# Patient Record
Sex: Female | Born: 1960 | Race: White | Hispanic: No | Marital: Single | Smoking: Never smoker
Health system: Southern US, Community
[De-identification: ages and names within clinical notes are randomized; demographics above are authoritative.]

## PROBLEM LIST (undated history)

## (undated) DIAGNOSIS — J45909 Unspecified asthma, uncomplicated: Secondary | ICD-10-CM

## (undated) DIAGNOSIS — J40 Bronchitis, not specified as acute or chronic: Secondary | ICD-10-CM

## (undated) DIAGNOSIS — I10 Essential (primary) hypertension: Secondary | ICD-10-CM

## (undated) DIAGNOSIS — K449 Diaphragmatic hernia without obstruction or gangrene: Secondary | ICD-10-CM

## (undated) DIAGNOSIS — I2510 Atherosclerotic heart disease of native coronary artery without angina pectoris: Secondary | ICD-10-CM

## (undated) DIAGNOSIS — IMO0002 Reserved for concepts with insufficient information to code with codable children: Secondary | ICD-10-CM

## (undated) DIAGNOSIS — Q231 Congenital insufficiency of aortic valve: Secondary | ICD-10-CM

## (undated) DIAGNOSIS — I248 Other forms of acute ischemic heart disease: Secondary | ICD-10-CM

## (undated) DIAGNOSIS — Z9289 Personal history of other medical treatment: Secondary | ICD-10-CM

## (undated) DIAGNOSIS — R011 Cardiac murmur, unspecified: Secondary | ICD-10-CM

## (undated) HISTORY — DX: Cardiac murmur, unspecified: R01.1

## (undated) HISTORY — PX: ABLATION: SHX5711

## (undated) HISTORY — DX: Bronchitis, not specified as acute or chronic: J40

## (undated) HISTORY — DX: Diaphragmatic hernia without obstruction or gangrene: K44.9

## (undated) HISTORY — PX: OTHER SURGICAL HISTORY: SHX169

## (undated) HISTORY — PX: TOOTH EXTRACTION: SUR596

## (undated) HISTORY — DX: Reserved for concepts with insufficient information to code with codable children: IMO0002

## (undated) HISTORY — DX: Atherosclerotic heart disease of native coronary artery without angina pectoris: I25.10

## (undated) HISTORY — DX: Personal history of other medical treatment: Z92.89

## (undated) HISTORY — DX: Essential (primary) hypertension: I10

---

## 2005-02-27 ENCOUNTER — Emergency Department: Payer: Self-pay | Admitting: Emergency Medicine

## 2005-02-27 ENCOUNTER — Inpatient Hospital Stay: Payer: Self-pay | Admitting: Anesthesiology

## 2005-12-08 ENCOUNTER — Other Ambulatory Visit: Payer: Self-pay

## 2005-12-08 ENCOUNTER — Inpatient Hospital Stay: Payer: Self-pay | Admitting: Internal Medicine

## 2005-12-29 ENCOUNTER — Ambulatory Visit: Payer: Self-pay | Admitting: *Deleted

## 2006-01-08 ENCOUNTER — Ambulatory Visit: Payer: Self-pay | Admitting: *Deleted

## 2006-01-14 ENCOUNTER — Ambulatory Visit: Payer: Self-pay | Admitting: Gastroenterology

## 2006-05-20 ENCOUNTER — Emergency Department: Payer: Self-pay | Admitting: General Practice

## 2007-07-08 ENCOUNTER — Ambulatory Visit: Payer: Self-pay | Admitting: Family Medicine

## 2008-05-16 ENCOUNTER — Inpatient Hospital Stay: Payer: Self-pay | Admitting: Internal Medicine

## 2008-05-16 ENCOUNTER — Other Ambulatory Visit: Payer: Self-pay

## 2008-10-24 ENCOUNTER — Emergency Department: Payer: Self-pay | Admitting: Emergency Medicine

## 2008-11-04 ENCOUNTER — Emergency Department: Payer: Self-pay | Admitting: Internal Medicine

## 2008-11-05 ENCOUNTER — Observation Stay: Payer: Self-pay | Admitting: Obstetrics and Gynecology

## 2008-11-24 ENCOUNTER — Emergency Department: Payer: Self-pay | Admitting: Unknown Physician Specialty

## 2009-03-04 ENCOUNTER — Ambulatory Visit: Payer: Self-pay | Admitting: Internal Medicine

## 2009-11-29 ENCOUNTER — Emergency Department: Payer: Self-pay | Admitting: Emergency Medicine

## 2010-01-09 ENCOUNTER — Emergency Department: Payer: Self-pay | Admitting: Emergency Medicine

## 2010-02-06 ENCOUNTER — Inpatient Hospital Stay: Payer: Self-pay | Admitting: Internal Medicine

## 2010-02-14 ENCOUNTER — Ambulatory Visit: Payer: Self-pay | Admitting: Unknown Physician Specialty

## 2010-02-28 ENCOUNTER — Ambulatory Visit: Payer: Self-pay | Admitting: Unknown Physician Specialty

## 2010-03-24 ENCOUNTER — Ambulatory Visit: Payer: Self-pay | Admitting: Surgery

## 2010-04-01 ENCOUNTER — Ambulatory Visit: Payer: Self-pay | Admitting: Surgery

## 2010-10-04 ENCOUNTER — Emergency Department: Payer: Self-pay | Admitting: Emergency Medicine

## 2011-09-09 ENCOUNTER — Ambulatory Visit: Payer: Self-pay | Admitting: Family Medicine

## 2011-09-12 ENCOUNTER — Inpatient Hospital Stay: Payer: Self-pay | Admitting: *Deleted

## 2011-09-13 ENCOUNTER — Ambulatory Visit: Payer: Self-pay | Admitting: Oncology

## 2011-09-28 ENCOUNTER — Ambulatory Visit: Payer: Self-pay | Admitting: Oncology

## 2011-10-07 ENCOUNTER — Ambulatory Visit: Payer: Self-pay | Admitting: Surgery

## 2011-10-12 ENCOUNTER — Ambulatory Visit: Payer: Self-pay | Admitting: Surgery

## 2011-10-21 ENCOUNTER — Ambulatory Visit: Payer: Self-pay | Admitting: Gastroenterology

## 2011-11-23 ENCOUNTER — Ambulatory Visit: Payer: Self-pay | Admitting: Family Medicine

## 2011-11-26 ENCOUNTER — Ambulatory Visit: Payer: Self-pay | Admitting: Family Medicine

## 2012-03-02 ENCOUNTER — Ambulatory Visit: Payer: Self-pay | Admitting: Family Medicine

## 2012-10-12 ENCOUNTER — Emergency Department: Payer: Self-pay | Admitting: *Deleted

## 2012-10-12 LAB — CBC
HCT: 26 % — ABNORMAL LOW (ref 35.0–47.0)
HGB: 8.3 g/dL — ABNORMAL LOW (ref 12.0–16.0)
RDW: 14.6 % — ABNORMAL HIGH (ref 11.5–14.5)
WBC: 5.5 10*3/uL (ref 3.6–11.0)

## 2012-10-12 LAB — COMPREHENSIVE METABOLIC PANEL
Albumin: 3.5 g/dL (ref 3.4–5.0)
Alkaline Phosphatase: 53 U/L (ref 50–136)
Anion Gap: 9 (ref 7–16)
Calcium, Total: 8.8 mg/dL (ref 8.5–10.1)
Chloride: 105 mmol/L (ref 98–107)
EGFR (African American): >60
Glucose: 107 mg/dL — ABNORMAL HIGH (ref 65–99)
Osmolality: 286 (ref 275–301)
Potassium: 3.5 mmol/L (ref 3.5–5.1)
SGOT(AST): 24 U/L (ref 15–37)
Sodium: 143 mmol/L (ref 136–145)
Total Protein: 7 g/dL (ref 6.4–8.2)

## 2012-10-14 ENCOUNTER — Other Ambulatory Visit: Payer: Self-pay | Admitting: Family Medicine

## 2012-10-14 LAB — CBC WITH DIFFERENTIAL/PLATELET
Basophil %: 1.2 %
Eosinophil #: 0.1 10*3/uL (ref 0.0–0.7)
Eosinophil %: 1.4 %
HCT: 27.4 % — ABNORMAL LOW (ref 35.0–47.0)
Lymphocyte #: 0.5 10*3/uL — ABNORMAL LOW (ref 1.0–3.6)
MCH: 30 pg (ref 26.0–34.0)
MCHC: 32.3 g/dL (ref 32.0–36.0)
MCV: 93 fL (ref 80–100)
Monocyte #: 0.4 x10 3/mm (ref 0.2–0.9)
Monocyte %: 9.5 %
Neutrophil %: 77.7 %
Platelet: 248 10*3/uL (ref 150–440)
RBC: 2.95 10*6/uL — ABNORMAL LOW (ref 3.80–5.20)
RDW: 14.4 % (ref 11.5–14.5)
WBC: 4.5 10*3/uL (ref 3.6–11.0)

## 2012-10-20 ENCOUNTER — Ambulatory Visit: Payer: Self-pay | Admitting: Oncology

## 2012-10-20 LAB — IRON AND TIBC
Iron Bind.Cap.(Total): 411 ug/dL (ref 250–450)
Iron Saturation: 27 %
Iron: 113 ug/dL (ref 50–170)

## 2012-10-20 LAB — CBC CANCER CENTER
Basophil #: 0.1 x10 3/mm (ref 0.0–0.1)
HCT: 21.7 % — ABNORMAL LOW (ref 35.0–47.0)
Lymphocyte %: 8.9 %
MCV: 92 fL (ref 80–100)
Monocyte #: 0.7 x10 3/mm (ref 0.2–0.9)
Monocyte %: 8.5 %
Neutrophil #: 6.9 x10 3/mm — ABNORMAL HIGH (ref 1.4–6.5)
Platelet: 269 x10 3/mm (ref 150–440)
RBC: 2.36 10*6/uL — ABNORMAL LOW (ref 3.80–5.20)
RDW: 14.2 % (ref 11.5–14.5)
WBC: 8.4 x10 3/mm (ref 3.6–11.0)

## 2012-10-20 LAB — FOLATE: Folic Acid: 98.7 ng/mL (ref 3.1–100.0)

## 2012-10-20 LAB — FERRITIN: Ferritin (ARMC): 9 ng/mL (ref 8–388)

## 2012-10-28 ENCOUNTER — Ambulatory Visit: Payer: Self-pay | Admitting: Oncology

## 2012-11-18 LAB — CBC CANCER CENTER
Basophil #: 0.1 x10 3/mm (ref 0.0–0.1)
Basophil %: 1 %
Eosinophil #: 0.1 x10 3/mm (ref 0.0–0.7)
Lymphocyte %: 13.5 %
MCH: 26.6 pg (ref 26.0–34.0)
MCHC: 30.5 g/dL — ABNORMAL LOW (ref 32.0–36.0)
Monocyte #: 0.8 x10 3/mm (ref 0.2–0.9)
Neutrophil #: 4 x10 3/mm (ref 1.4–6.5)
Neutrophil %: 69.6 %
Platelet: 297 x10 3/mm (ref 150–440)
RDW: 14.9 % — ABNORMAL HIGH (ref 11.5–14.5)
WBC: 5.7 x10 3/mm (ref 3.6–11.0)

## 2012-11-27 ENCOUNTER — Ambulatory Visit: Payer: Self-pay | Admitting: Oncology

## 2012-12-28 ENCOUNTER — Ambulatory Visit: Payer: Self-pay | Admitting: Oncology

## 2013-01-20 LAB — CBC CANCER CENTER
Basophil %: 1 %
Eosinophil #: 0.1 x10 3/mm (ref 0.0–0.7)
HGB: 12.5 g/dL (ref 12.0–16.0)
Lymphocyte #: 0.8 x10 3/mm — ABNORMAL LOW (ref 1.0–3.6)
Lymphocyte %: 12.1 %
MCH: 28.2 pg (ref 26.0–34.0)
MCV: 87 fL (ref 80–100)
Monocyte #: 0.6 x10 3/mm (ref 0.2–0.9)
Monocyte %: 10 %
Neutrophil #: 4.8 x10 3/mm (ref 1.4–6.5)
RDW: 17.7 % — ABNORMAL HIGH (ref 11.5–14.5)
WBC: 6.3 x10 3/mm (ref 3.6–11.0)

## 2013-01-20 LAB — FERRITIN: Ferritin (ARMC): 18 ng/mL (ref 8–388)

## 2013-01-20 LAB — IRON AND TIBC
Iron Bind.Cap.(Total): 403 ug/dL (ref 250–450)
Iron Saturation: 31 %
Iron: 124 ug/dL (ref 50–170)
Unbound Iron-Bind.Cap.: 279 ug/dL

## 2013-01-28 ENCOUNTER — Ambulatory Visit: Payer: Self-pay | Admitting: Oncology

## 2013-03-28 ENCOUNTER — Ambulatory Visit: Payer: Self-pay | Admitting: Oncology

## 2013-04-20 LAB — RETICULOCYTES
Absolute Retic Count: 0.1526 10*6/uL
Reticulocyte: 6.05 % — ABNORMAL HIGH

## 2013-04-20 LAB — CBC CANCER CENTER
Basophil %: 1.5 %
HCT: 21.2 % — ABNORMAL LOW (ref 35.0–47.0)
HGB: 6.1 g/dL — ABNORMAL LOW (ref 12.0–16.0)
Lymphocyte %: 9.2 %
Monocyte %: 10.9 %
Neutrophil #: 4 x10 3/mm (ref 1.4–6.5)
Platelet: 244 x10 3/mm (ref 150–440)
RBC: 2.53 10*6/uL — ABNORMAL LOW (ref 3.80–5.20)
RDW: 16.1 % — ABNORMAL HIGH (ref 11.5–14.5)
WBC: 5.2 x10 3/mm (ref 3.6–11.0)

## 2013-04-20 LAB — LACTATE DEHYDROGENASE: LDH: 183 U/L (ref 81–246)

## 2013-04-20 LAB — IRON AND TIBC
Iron Saturation: 22 %
Iron: 94 ug/dL (ref 50–170)
Unbound Iron-Bind.Cap.: 334 ug/dL

## 2013-04-20 LAB — FOLATE: Folic Acid: 79.6 ng/mL (ref 3.1–100.0)

## 2013-04-27 ENCOUNTER — Ambulatory Visit: Payer: Self-pay | Admitting: Oncology

## 2013-04-27 ENCOUNTER — Inpatient Hospital Stay: Payer: Self-pay | Admitting: Internal Medicine

## 2013-04-27 LAB — COMPREHENSIVE METABOLIC PANEL
Anion Gap: 6 — ABNORMAL LOW (ref 7–16)
BUN: 28 mg/dL — ABNORMAL HIGH (ref 7–18)
Chloride: 104 mmol/L (ref 98–107)
Co2: 29 mmol/L (ref 21–32)
EGFR (African American): >60
Glucose: 111 mg/dL — ABNORMAL HIGH (ref 65–99)
Osmolality: 284 (ref 275–301)
SGPT (ALT): 18 U/L (ref 12–78)
Sodium: 139 mmol/L (ref 136–145)
Total Protein: 6 g/dL — ABNORMAL LOW (ref 6.4–8.2)

## 2013-04-27 LAB — CBC
HCT: 22 % — ABNORMAL LOW (ref 35.0–47.0)
MCH: 25.7 pg — ABNORMAL LOW (ref 26.0–34.0)
MCHC: 30.7 g/dL — ABNORMAL LOW (ref 32.0–36.0)
MCV: 84 fL (ref 80–100)
RBC: 2.63 10*6/uL — ABNORMAL LOW (ref 3.80–5.20)
RDW: 16 % — ABNORMAL HIGH (ref 11.5–14.5)

## 2013-04-27 LAB — URINALYSIS, COMPLETE
Hyaline Cast: 3
Ketone: NEGATIVE
Ph: 5 (ref 4.5–8.0)
Protein: NEGATIVE
Specific Gravity: 1.02 (ref 1.003–1.030)

## 2013-04-27 LAB — TROPONIN I: Troponin-I: <0.02 ng/mL

## 2013-04-28 LAB — CBC WITH DIFFERENTIAL/PLATELET
Eosinophil %: 0.4 %
HCT: 19.9 % — ABNORMAL LOW (ref 35.0–47.0)
Lymphocyte #: 0.4 10*3/uL — ABNORMAL LOW (ref 1.0–3.6)
Lymphocyte %: 6.5 %
MCH: 25.4 pg — ABNORMAL LOW (ref 26.0–34.0)
MCV: 84 fL (ref 80–100)
Monocyte #: 0.5 x10 3/mm (ref 0.2–0.9)
Neutrophil %: 85.1 %
RBC: 2.37 10*6/uL — ABNORMAL LOW (ref 3.80–5.20)
RDW: 16.3 % — ABNORMAL HIGH (ref 11.5–14.5)
WBC: 6.9 10*3/uL (ref 3.6–11.0)

## 2013-04-28 LAB — PROTIME-INR
INR: 1
Prothrombin Time: 13.8 secs (ref 11.5–14.7)

## 2013-04-28 LAB — BASIC METABOLIC PANEL
BUN: 23 mg/dL — ABNORMAL HIGH (ref 7–18)
Co2: 31 mmol/L (ref 21–32)
Creatinine: 0.69 mg/dL (ref 0.60–1.30)
EGFR (African American): >60
Osmolality: 284 (ref 275–301)
Sodium: 140 mmol/L (ref 136–145)

## 2013-04-29 LAB — CBC WITH DIFFERENTIAL/PLATELET
Basophil %: 1.6 %
Eosinophil %: 1.8 %
Lymphocyte #: 0.7 10*3/uL — ABNORMAL LOW (ref 1.0–3.6)
Lymphocyte %: 18.9 %
MCH: 26.9 pg (ref 26.0–34.0)
MCHC: 32.6 g/dL (ref 32.0–36.0)
MCV: 83 fL (ref 80–100)
Monocyte %: 11.2 %
Neutrophil #: 2.5 10*3/uL (ref 1.4–6.5)
Neutrophil %: 66.5 %
Platelet: 202 10*3/uL (ref 150–440)
RBC: 3.14 10*6/uL — ABNORMAL LOW (ref 3.80–5.20)
RDW: 16.1 % — ABNORMAL HIGH (ref 11.5–14.5)

## 2013-04-29 LAB — POTASSIUM: Potassium: 3.6 mmol/L (ref 3.5–5.1)

## 2013-05-11 LAB — CBC CANCER CENTER
Basophil %: 2.6 %
Eosinophil #: 0.1 x10 3/mm (ref 0.0–0.7)
Eosinophil %: 1.7 %
HCT: 34.2 % — ABNORMAL LOW (ref 35.0–47.0)
Lymphocyte #: 0.6 x10 3/mm — ABNORMAL LOW (ref 1.0–3.6)
Lymphocyte %: 16.4 %
MCH: 25.3 pg — ABNORMAL LOW (ref 26.0–34.0)
MCHC: 30.7 g/dL — ABNORMAL LOW (ref 32.0–36.0)
Monocyte #: 0.5 x10 3/mm (ref 0.2–0.9)
Neutrophil #: 2.5 x10 3/mm (ref 1.4–6.5)
Neutrophil %: 65.5 %
Platelet: 299 x10 3/mm (ref 150–440)
RDW: 15.2 % — ABNORMAL HIGH (ref 11.5–14.5)
WBC: 3.9 x10 3/mm (ref 3.6–11.0)

## 2013-05-28 ENCOUNTER — Ambulatory Visit: Payer: Self-pay | Admitting: Oncology

## 2013-06-08 LAB — IRON AND TIBC
Iron: 57 ug/dL (ref 50–170)
Unbound Iron-Bind.Cap.: 330 ug/dL

## 2013-06-08 LAB — CBC CANCER CENTER
Basophil #: 0.1 x10 3/mm (ref 0.0–0.1)
Basophil %: 1.2 %
Lymphocyte #: 0.7 x10 3/mm — ABNORMAL LOW (ref 1.0–3.6)
Lymphocyte %: 16.4 %
MCHC: 32.4 g/dL (ref 32.0–36.0)
MCV: 84 fL (ref 80–100)
Neutrophil #: 2.9 x10 3/mm (ref 1.4–6.5)
RBC: 4.44 10*6/uL (ref 3.80–5.20)
WBC: 4.2 x10 3/mm (ref 3.6–11.0)

## 2013-06-27 ENCOUNTER — Ambulatory Visit: Payer: Self-pay | Admitting: Oncology

## 2013-07-06 LAB — CBC CANCER CENTER
Basophil #: 0.1 x10 3/mm (ref 0.0–0.1)
Basophil %: 1.1 %
Eosinophil #: 0.1 x10 3/mm (ref 0.0–0.7)
HCT: 41.3 % (ref 35.0–47.0)
HGB: 13.5 g/dL (ref 12.0–16.0)
Lymphocyte #: 0.8 x10 3/mm — ABNORMAL LOW (ref 1.0–3.6)
Lymphocyte %: 13.9 %
MCH: 28.4 pg (ref 26.0–34.0)
Neutrophil %: 73.1 %
Platelet: 221 x10 3/mm (ref 150–440)

## 2013-07-13 LAB — CBC CANCER CENTER
Basophil #: 0.1 x10 3/mm (ref 0.0–0.1)
Basophil %: 0.9 %
Eosinophil %: 0.6 %
HCT: 41.1 % (ref 35.0–47.0)
HGB: 13.7 g/dL (ref 12.0–16.0)
Lymphocyte %: 16.1 %
MCH: 29 pg (ref 26.0–34.0)
MCV: 87 fL (ref 80–100)
Monocyte %: 7.5 %
Neutrophil #: 4.9 x10 3/mm (ref 1.4–6.5)
Neutrophil %: 74.9 %
Platelet: 229 x10 3/mm (ref 150–440)
RBC: 4.71 10*6/uL (ref 3.80–5.20)
RDW: 19.5 % — ABNORMAL HIGH (ref 11.5–14.5)
WBC: 6.5 x10 3/mm (ref 3.6–11.0)

## 2013-07-28 ENCOUNTER — Ambulatory Visit: Payer: Self-pay | Admitting: Oncology

## 2013-10-31 ENCOUNTER — Inpatient Hospital Stay: Payer: Self-pay | Admitting: Internal Medicine

## 2013-10-31 LAB — BASIC METABOLIC PANEL
Anion Gap: 3 — ABNORMAL LOW (ref 7–16)
BUN: 7 mg/dL (ref 7–18)
Calcium, Total: 9.7 mg/dL (ref 8.5–10.1)
EGFR (African American): >60
EGFR (Non-African Amer.): >60
Glucose: 124 mg/dL — ABNORMAL HIGH (ref 65–99)
Osmolality: 271 (ref 275–301)
Potassium: 2.9 mmol/L — ABNORMAL LOW (ref 3.5–5.1)

## 2013-10-31 LAB — CBC
MCH: 31.7 pg (ref 26.0–34.0)
MCHC: 34.2 g/dL (ref 32.0–36.0)
Platelet: 209 10*3/uL (ref 150–440)
RBC: 4.12 10*6/uL (ref 3.80–5.20)
WBC: 11.7 10*3/uL — ABNORMAL HIGH (ref 3.6–11.0)

## 2013-11-01 LAB — CBC WITH DIFFERENTIAL/PLATELET
Basophil #: 0 10*3/uL (ref 0.0–0.1)
Basophil %: 0 %
Eosinophil #: 0 10*3/uL (ref 0.0–0.7)
Eosinophil %: 0 %
HCT: 36.6 % (ref 35.0–47.0)
Lymphocyte #: 0.4 10*3/uL — ABNORMAL LOW (ref 1.0–3.6)
Lymphocyte %: 3.2 %
MCH: 31.4 pg (ref 26.0–34.0)
MCHC: 33.9 g/dL (ref 32.0–36.0)
MCV: 93 fL (ref 80–100)
Platelet: 250 10*3/uL (ref 150–440)
RBC: 3.95 10*6/uL (ref 3.80–5.20)
RDW: 14.3 % (ref 11.5–14.5)
WBC: 12.3 10*3/uL — ABNORMAL HIGH (ref 3.6–11.0)

## 2013-11-01 LAB — MAGNESIUM: Magnesium: 2.6 mg/dL — ABNORMAL HIGH

## 2013-11-09 ENCOUNTER — Ambulatory Visit: Payer: Self-pay | Admitting: Oncology

## 2013-11-09 LAB — CBC CANCER CENTER
Basophil %: 0.5 %
Eosinophil %: 0.3 %
HCT: 42.5 % (ref 35.0–47.0)
HGB: 14 g/dL (ref 12.0–16.0)
Lymphocyte #: 1.7 x10 3/mm (ref 1.0–3.6)
Lymphocyte %: 15.6 %
MCHC: 32.8 g/dL (ref 32.0–36.0)
MCV: 94 fL (ref 80–100)
Platelet: 281 x10 3/mm (ref 150–440)
RBC: 4.53 10*6/uL (ref 3.80–5.20)
RDW: 14.3 % (ref 11.5–14.5)

## 2013-11-09 LAB — IRON AND TIBC
Iron Bind.Cap.(Total): 310 ug/dL (ref 250–450)
Iron Saturation: 42 %

## 2013-11-27 ENCOUNTER — Ambulatory Visit: Payer: Self-pay | Admitting: Oncology

## 2014-02-08 ENCOUNTER — Ambulatory Visit: Payer: Self-pay | Admitting: Oncology

## 2014-02-09 LAB — CBC CANCER CENTER
Basophil #: 0 x10 3/mm (ref 0.0–0.1)
Basophil %: 0.7 %
EOS PCT: 1.6 %
Eosinophil #: 0.1 x10 3/mm (ref 0.0–0.7)
HCT: 41 % (ref 35.0–47.0)
HGB: 13.8 g/dL (ref 12.0–16.0)
Lymphocyte #: 0.7 x10 3/mm — ABNORMAL LOW (ref 1.0–3.6)
Lymphocyte %: 14.6 %
MCH: 31.9 pg (ref 26.0–34.0)
MCHC: 33.8 g/dL (ref 32.0–36.0)
MCV: 94 fL (ref 80–100)
MONO ABS: 0.5 x10 3/mm (ref 0.2–0.9)
Monocyte %: 10.6 %
Neutrophil #: 3.6 x10 3/mm (ref 1.4–6.5)
Neutrophil %: 72.5 %
Platelet: 206 x10 3/mm (ref 150–440)
RBC: 4.34 10*6/uL (ref 3.80–5.20)
RDW: 13.7 % (ref 11.5–14.5)
WBC: 5 x10 3/mm (ref 3.6–11.0)

## 2014-02-09 LAB — IRON AND TIBC
IRON SATURATION: 27 %
Iron Bind.Cap.(Total): 381 ug/dL (ref 250–450)
Iron: 103 ug/dL (ref 50–170)
Unbound Iron-Bind.Cap.: 278 ug/dL

## 2014-02-09 LAB — FERRITIN: FERRITIN (ARMC): 31 ng/mL (ref 8–388)

## 2014-02-25 ENCOUNTER — Ambulatory Visit: Payer: Self-pay | Admitting: Oncology

## 2014-05-10 ENCOUNTER — Ambulatory Visit: Payer: Self-pay | Admitting: Oncology

## 2014-05-11 LAB — IRON AND TIBC
IRON SATURATION: 27 %
Iron Bind.Cap.(Total): 355 ug/dL (ref 250–450)
Iron: 97 ug/dL (ref 50–170)
UNBOUND IRON-BIND. CAP.: 258 ug/dL

## 2014-05-11 LAB — CBC CANCER CENTER
BASOS PCT: 0.8 %
Basophil #: 0 x10 3/mm (ref 0.0–0.1)
EOS ABS: 0.1 x10 3/mm (ref 0.0–0.7)
Eosinophil %: 2.6 %
HCT: 40.8 % (ref 35.0–47.0)
HGB: 13.6 g/dL (ref 12.0–16.0)
LYMPHS ABS: 0.9 x10 3/mm — AB (ref 1.0–3.6)
LYMPHS PCT: 16.9 %
MCH: 30.4 pg (ref 26.0–34.0)
MCHC: 33.3 g/dL (ref 32.0–36.0)
MCV: 91 fL (ref 80–100)
Monocyte #: 0.5 x10 3/mm (ref 0.2–0.9)
Monocyte %: 8.9 %
NEUTROS ABS: 3.9 x10 3/mm (ref 1.4–6.5)
Neutrophil %: 70.8 %
Platelet: 209 x10 3/mm (ref 150–440)
RBC: 4.47 10*6/uL (ref 3.80–5.20)
RDW: 14.3 % (ref 11.5–14.5)
WBC: 5.6 x10 3/mm (ref 3.6–11.0)

## 2014-05-11 LAB — FERRITIN: FERRITIN (ARMC): 27 ng/mL (ref 8–388)

## 2014-05-28 ENCOUNTER — Ambulatory Visit: Payer: Self-pay | Admitting: Oncology

## 2014-07-30 ENCOUNTER — Ambulatory Visit (INDEPENDENT_AMBULATORY_CARE_PROVIDER_SITE_OTHER): Payer: BC Managed Care – PPO | Admitting: Family Medicine

## 2014-07-30 ENCOUNTER — Encounter: Payer: Self-pay | Admitting: Family Medicine

## 2014-07-30 VITALS — BP 128/70 | HR 77 | Temp 97.7°F | Ht <= 58 in | Wt 177.5 lb

## 2014-07-30 DIAGNOSIS — I10 Essential (primary) hypertension: Secondary | ICD-10-CM

## 2014-07-30 DIAGNOSIS — Z136 Encounter for screening for cardiovascular disorders: Secondary | ICD-10-CM

## 2014-07-30 DIAGNOSIS — F4323 Adjustment disorder with mixed anxiety and depressed mood: Secondary | ICD-10-CM

## 2014-07-30 DIAGNOSIS — J449 Chronic obstructive pulmonary disease, unspecified: Secondary | ICD-10-CM

## 2014-07-30 DIAGNOSIS — E538 Deficiency of other specified B group vitamins: Secondary | ICD-10-CM

## 2014-07-30 DIAGNOSIS — Z1231 Encounter for screening mammogram for malignant neoplasm of breast: Secondary | ICD-10-CM

## 2014-07-30 DIAGNOSIS — J4489 Other specified chronic obstructive pulmonary disease: Secondary | ICD-10-CM

## 2014-07-30 DIAGNOSIS — D509 Iron deficiency anemia, unspecified: Secondary | ICD-10-CM

## 2014-07-30 DIAGNOSIS — D62 Acute posthemorrhagic anemia: Secondary | ICD-10-CM

## 2014-07-30 DIAGNOSIS — Z8719 Personal history of other diseases of the digestive system: Secondary | ICD-10-CM

## 2014-07-30 LAB — CBC WITH DIFFERENTIAL/PLATELET
BASOS PCT: 0.4 % (ref 0.0–3.0)
Basophils Absolute: 0 10*3/uL (ref 0.0–0.1)
EOS PCT: 1.3 % (ref 0.0–5.0)
Eosinophils Absolute: 0.1 10*3/uL (ref 0.0–0.7)
HCT: 39 % (ref 36.0–46.0)
Hemoglobin: 12.9 g/dL (ref 12.0–15.0)
Lymphocytes Relative: 11.5 % — ABNORMAL LOW (ref 12.0–46.0)
Lymphs Abs: 0.8 10*3/uL (ref 0.7–4.0)
MCHC: 33 g/dL (ref 30.0–36.0)
MCV: 93.3 fl (ref 78.0–100.0)
MONO ABS: 0.6 10*3/uL (ref 0.1–1.0)
Monocytes Relative: 9.1 % (ref 3.0–12.0)
NEUTROS ABS: 5.3 10*3/uL (ref 1.4–7.7)
NEUTROS PCT: 77.7 % — AB (ref 43.0–77.0)
Platelets: 234 10*3/uL (ref 150.0–400.0)
RBC: 4.19 Mil/uL (ref 3.87–5.11)
RDW: 14.5 % (ref 11.5–15.5)
WBC: 6.9 10*3/uL (ref 4.0–10.5)

## 2014-07-30 LAB — COMPREHENSIVE METABOLIC PANEL
ALK PHOS: 58 U/L (ref 39–117)
ALT: 14 U/L (ref 0–35)
AST: 22 U/L (ref 0–37)
Albumin: 4.1 g/dL (ref 3.5–5.2)
BUN: 17 mg/dL (ref 6–23)
CO2: 31 meq/L (ref 19–32)
Calcium: 9.1 mg/dL (ref 8.4–10.5)
Chloride: 102 mEq/L (ref 96–112)
Creatinine, Ser: 0.8 mg/dL (ref 0.4–1.2)
GFR: 80.5 mL/min (ref >60.00)
Glucose, Bld: 101 mg/dL — ABNORMAL HIGH (ref 70–99)
Potassium: 3.4 mEq/L — ABNORMAL LOW (ref 3.5–5.1)
SODIUM: 139 meq/L (ref 135–145)
TOTAL PROTEIN: 7.2 g/dL (ref 6.0–8.3)
Total Bilirubin: 0.5 mg/dL (ref 0.2–1.2)

## 2014-07-30 LAB — LIPID PANEL
CHOL/HDL RATIO: 3
Cholesterol: 210 mg/dL — ABNORMAL HIGH (ref 0–200)
HDL: 61.2 mg/dL (ref >39.00)
LDL CALC: 110 mg/dL — AB (ref 0–99)
NONHDL: 148.8
Triglycerides: 196 mg/dL — ABNORMAL HIGH (ref 0.0–149.0)
VLDL: 39.2 mg/dL (ref 0.0–40.0)

## 2014-07-30 LAB — FERRITIN: Ferritin: 32.3 ng/mL (ref 10.0–291.0)

## 2014-07-30 LAB — VITAMIN B12: VITAMIN B 12: 1109 pg/mL — AB (ref 211–911)

## 2014-07-30 LAB — IBC PANEL
IRON: 65 ug/dL (ref 42–145)
Saturation Ratios: 18.1 % — ABNORMAL LOW (ref 20.0–50.0)
TRANSFERRIN: 256.8 mg/dL (ref 212.0–360.0)

## 2014-07-30 LAB — TSH: TSH: 1.16 u[IU]/mL (ref 0.35–4.50)

## 2014-07-30 MED ORDER — SERTRALINE HCL 25 MG PO TABS: 25.0000 mg | ORAL_TABLET | Freq: Every day | ORAL | Status: DC

## 2014-07-30 NOTE — Patient Instructions (Addendum)
It was nice to meet you. We are starting zoloft 25 mg daily. Please come see me in 1 month.  I will call you with your lab results form today.  Please call to set up your mammogram.
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
## 2014-07-30 NOTE — Assessment & Plan Note (Signed)
Well controlled on current rx. No changes. 

## 2014-07-30 NOTE — Assessment & Plan Note (Addendum)
On Advair- will request records for PFTs. Does not use rescue inhaler.  We discussed this today- she could benefit from this.

## 2014-07-30 NOTE — Assessment & Plan Note (Signed)
On iron, folate and b12.  Awaiting old records. Check labs today.

## 2014-07-30 NOTE — Assessment & Plan Note (Signed)
Check labs today. Orders Placed This Encounter  Procedures  . MM Digital Screening  . CBC with Differential  . Ferritin  . IBC Panel  . Vitamin B12  . Comprehensive metabolic panel  . Lipid panel  . TSH

## 2014-07-30 NOTE — Progress Notes (Signed)
Pre visit review using our clinic review tool, if applicable. No additional management support is needed unless otherwise documented below in the visit note. 

## 2014-07-30 NOTE — Assessment & Plan Note (Signed)
New- progressive due to financial stressors.  She is talking to an attorney about disability/unemployment options. Start low dose zoloft- 25 mg daily. Follow up in 1 month . The patient indicates understanding of these issues and agrees with the plan.

## 2014-07-31 ENCOUNTER — Ambulatory Visit: Payer: Self-pay | Admitting: Family Medicine

## 2014-07-31 ENCOUNTER — Telehealth: Payer: Self-pay | Admitting: Family Medicine

## 2014-07-31 NOTE — Telephone Encounter (Signed)
Relevant patient education assigned to patient using Emmi. ° °

## 2014-08-02 ENCOUNTER — Encounter: Payer: Self-pay | Admitting: Family Medicine

## 2014-08-17 ENCOUNTER — Encounter: Payer: Self-pay | Admitting: Family Medicine

## 2014-08-17 ENCOUNTER — Ambulatory Visit (INDEPENDENT_AMBULATORY_CARE_PROVIDER_SITE_OTHER): Payer: BC Managed Care – PPO | Admitting: Family Medicine

## 2014-08-17 VITALS — BP 148/92 | HR 85 | Temp 98.2°F | Wt 178.2 lb

## 2014-08-17 DIAGNOSIS — I10 Essential (primary) hypertension: Secondary | ICD-10-CM

## 2014-08-17 DIAGNOSIS — J449 Chronic obstructive pulmonary disease, unspecified: Secondary | ICD-10-CM

## 2014-08-17 DIAGNOSIS — F4323 Adjustment disorder with mixed anxiety and depressed mood: Secondary | ICD-10-CM

## 2014-08-17 DIAGNOSIS — D62 Acute posthemorrhagic anemia: Secondary | ICD-10-CM

## 2014-08-17 DIAGNOSIS — Z8719 Personal history of other diseases of the digestive system: Secondary | ICD-10-CM

## 2014-08-17 DIAGNOSIS — Z23 Encounter for immunization: Secondary | ICD-10-CM

## 2014-08-17 DIAGNOSIS — D509 Iron deficiency anemia, unspecified: Secondary | ICD-10-CM

## 2014-08-17 NOTE — Assessment & Plan Note (Signed)
Resolved with daily ferrous sulfate. CBC results discussed with pt today.

## 2014-08-17 NOTE — Patient Instructions (Signed)
Please restart your zoloft and give a couple more weeks. Call me with an update.

## 2014-08-17 NOTE — Assessment & Plan Note (Signed)
Persistent but she stopped taking SSRI. >25 minutes spent in face to face time with patient, >50% spent in counselling or coordination of care She does agree to restart zoloft 25 mg daily and call me in 2 weeks with an update.

## 2014-08-17 NOTE — Assessment & Plan Note (Addendum)
She can no longer afford advair. We will call her pharmacy to find out what is on formulary for her. Influenza vaccine given today.

## 2014-08-17 NOTE — Assessment & Plan Note (Signed)
Reasonable control on current rx.  Was normotensive earlier this month. She is nervous today. Asymptomatic.

## 2014-08-17 NOTE — Progress Notes (Signed)
Pre visit review using our clinic review tool, if applicable. No additional management support is needed unless otherwise documented below in the visit note. 
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
## 2014-09-17 ENCOUNTER — Telehealth: Payer: Self-pay | Admitting: Family Medicine

## 2014-09-17 MED ORDER — ALBUTEROL SULFATE HFA 108 (90 BASE) MCG/ACT IN AERS: 2.0000 | INHALATION_SPRAY | Freq: Four times a day (QID) | RESPIRATORY_TRACT | Status: DC | PRN

## 2014-09-17 NOTE — Telephone Encounter (Signed)
Pt walked in and had stopped by her pharmacy because she needs a cheaper, low cost inhaler. Pt said, per her pharmacy, that she used to be on Ventolin HFA 90 mcg inhaler. She can get that for $10 with her insurance compared to $60 for the other inhaler. She cannot continue to pay the $60 and she needs the inhaler. Money is very tight. Pt is aware you are out of the office today and will return tomorrow. Thank you

## 2014-09-17 NOTE — Telephone Encounter (Signed)
OK to send in Ventolin if that is cheaper for her

## 2014-09-17 NOTE — Telephone Encounter (Signed)
Rx sent to requested pharmacy

## 2014-11-27 ENCOUNTER — Other Ambulatory Visit: Payer: Self-pay | Admitting: *Deleted

## 2014-11-27 MED ORDER — LISINOPRIL-HYDROCHLOROTHIAZIDE 10-12.5 MG PO TABS: 1.0000 | ORAL_TABLET | Freq: Every day | ORAL | Status: DC

## 2014-12-17 ENCOUNTER — Emergency Department: Payer: Self-pay | Admitting: Emergency Medicine

## 2014-12-17 ENCOUNTER — Telehealth: Payer: Self-pay

## 2014-12-17 LAB — BASIC METABOLIC PANEL
Anion Gap: 6 — ABNORMAL LOW (ref 7–16)
BUN: 12 mg/dL (ref 7–18)
CHLORIDE: 103 mmol/L (ref 98–107)
Calcium, Total: 9.1 mg/dL (ref 8.5–10.1)
Co2: 31 mmol/L (ref 21–32)
Creatinine: 0.66 mg/dL (ref 0.60–1.30)
EGFR (African American): >60
Glucose: 106 mg/dL — ABNORMAL HIGH (ref 65–99)
Osmolality: 280 (ref 275–301)
Potassium: 3.6 mmol/L (ref 3.5–5.1)
Sodium: 140 mmol/L (ref 136–145)

## 2014-12-17 LAB — CBC
HCT: 44.1 % (ref 35.0–47.0)
HGB: 13.9 g/dL (ref 12.0–16.0)
MCH: 30.1 pg (ref 26.0–34.0)
MCHC: 31.5 g/dL — ABNORMAL LOW (ref 32.0–36.0)
MCV: 95 fL (ref 80–100)
Platelet: 204 10*3/uL (ref 150–440)
RBC: 4.62 10*6/uL (ref 3.80–5.20)
RDW: 14.6 % — ABNORMAL HIGH (ref 11.5–14.5)
WBC: 6.1 10*3/uL (ref 3.6–11.0)

## 2014-12-17 LAB — TROPONIN I: Troponin-I: <0.02 ng/mL
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
## 2014-12-19 ENCOUNTER — Telehealth: Payer: Self-pay | Admitting: Family Medicine

## 2014-12-19 NOTE — Telephone Encounter (Signed)
Patient called to ask if she has to pay the bill she will get for the ambulance "we made her take" when she walked in with shortness of breath.  I explained that we will file this on her insurance, but yet if there is a bill she will be responsible.  I told her that Dr. Dayton MartesAron would not have sent her by EMS unless she felt it was medically necessary.  She understood. Pt states she will be bringing forms for DR. Dayton Martesron to fill out to get a handicapped sticker which is not related to this shortness of breath incident.

## 2014-12-19 NOTE — Telephone Encounter (Signed)
I did not see her.  She was not on my schedule  Rena came to me while I was seeing pts and described her symptoms- shortness of breath which to her seemed bad enough that Rena did not feel comfortable with her driving.  Rena told me that the patient herself also did not feel comfortable driving if I remember correctly.  Obviously with this information, we had to suggest she go to ER via EMS.  You [facility] for more information.

## 2014-12-31 ENCOUNTER — Telehealth: Payer: Self-pay | Admitting: Family Medicine

## 2014-12-31 DIAGNOSIS — Z7689 Persons encountering health services in other specified circumstances: Secondary | ICD-10-CM

## 2014-12-31 NOTE — Telephone Encounter (Signed)
Form placed in Dr Elmer Sow inbox

## 2014-12-31 NOTE — Telephone Encounter (Signed)
Pt dropped off application for disability parking placard to be filled out by Dr Dayton Martes. Forms placed on Waynetta's desk to give to Dr Dayton Martes. Please call pt when forms are ready to be picked up. Thank you!

## 2015-01-01 NOTE — Telephone Encounter (Signed)
Lm on pts vm and informed her form is available for pickup from the front desk; pt advised of $20 billed charge

## 2015-01-01 NOTE — Telephone Encounter (Signed)
Form signed and in my box. 

## 2015-01-02 ENCOUNTER — Telehealth: Payer: Self-pay | Admitting: *Deleted

## 2015-01-02 NOTE — Telephone Encounter (Signed)
Pt walked in, left message on triage non urgent form, then left. Per message she still has a cold, and wants a refill of prednisone. Pt's last ov was for a f/u in 07/2014. Prednisone is not on her med list. I called pt and left message on voicemail for her to return call to office.

## 2015-01-09 NOTE — Telephone Encounter (Signed)
Pt never returned call to office, so I called her this am. She said that she feels much better, and she still uses her inhaler. I instructed her to call back if symptoms returned or got worse.

## 2015-02-03 ENCOUNTER — Other Ambulatory Visit: Payer: Self-pay | Admitting: Family Medicine

## 2015-04-19 NOTE — Discharge Summary (Signed)
PATIENT NAME:  Michelle Barnett MR#:  [id number] DATE OF BIRTH:  [birth year]  DATE OF ADMISSION:  04/28/2013  DATE OF DISCHARGE:  04/29/2013  ADMITTING DIAGNOSIS: Hemoptysis, acute on chronic anemia.   DISCHARGE DIAGNOSES: 1.  Acute posthemorrhagic anemia, status post 2 units of packed red blood cell transfusion.  2.  Acute on chronic gastrointestinal bleed, hematemesis, status post EGD on 04/28/2013 by Dr. Candace Cruise, revealing tortuous esophagus, hiatal hernia, erosive gastropathy, which was biopsied, normal examined duodenum. 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 Candace Cruise.  3.  Urinary tract infection, completed antibiotic course.  4.  Hypertension.   DISCHARGE CONDITION:  Stable.   DISCHARGE MEDICATIONS: The patient is to resume her outpatient medications, which are 1.  Multivitamins once daily. 2.  Iron 65 mg p.o. daily.  3.  Hydrochlorothiazide/lisinopril 12.5 mg/10 mg oral tablet 1 tablet once a day.  4.  B12 vitamin 1000 mcg p.o. daily.  5.  Folic acid 0.4 mg 2 tablets once daily.  6.  Calcium with vitamin D 600/200 twice a day.  7.  Ester C 1 gram once daily.  8. New dose of omeprazole 40 mg p.o. twice daily from 20 mg once daily dose.  HOME OXYGEN:  None.   DIET: 2 grams salt, mechanical soft. The patient was advised to advance to regular diet as tolerated.   ACTIVITY LIMITATIONS:  As tolerated.   FOLLOWUP APPOINTMENT:  With Dr. Venia Minks in 2 days after discharge, as well as Dr. Candace Cruise in one week after discharge.    CONSULTANTS:  Dr. Grayland Ormond and Dr. Candace Cruise.  PROCEDURES:  Upper GI endoscopy on 05/03/2013 showing tortuous esophagus. Examination was otherwise normal. Hiatal hernia as well as erosive gastropathy was noted, which was biopsied. Normal examined duodenum. 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 Candace Cruise.    RADIOLOGIC STUDIES:  None.  HISTORY: The patient is a 54 year old Caucasian female with history of chronic anemia, who presented to the hospital with episodes of  hematemesis. Please refer to Dr. Rinaldo Ratel admission note on 04/28/2013. On arrival to the Emergency Room, patient was complaining of weakness, tiredness as well as hematemesis. She had coffee-ground emesis at work. Denied any abdominal pain or diarrhea.   VITAL SIGNS:  Her vital signs on day of discharge showed temperature of 99, pulse was 98, respiration rate was 24, blood pressure 163/56, saturation was 96% on room air.   PHYSICAL EXAMINATION:  Unremarkable.   LAB DATA:  Initially in the Emergency Room, elevated glucose to 111, BUN of 28, otherwise BMP was unremarkable. The patient's liver enzymes revealed albumin level of 3.1, otherwise liver enzymes were unremarkable. Troponin 1 set less than 0.02. White blood cell count was normal at 7.2, hemoglobin was 6.7, platelet count was 263. Coagulation panel was unremarkable. Urinalysis revealed cloudy yellow urine, negative for glucose, bilirubin or ketones, specific gravity was 1.020, pH was 5.0, negative for blood, protein or nitrites, 3+ leukocyte esterase was noted, also 1 red blood cells as well as 20 white blood cells on high power field, trace bacteria as well as 19 epithelial cells were also noted.   EKG showed normal sinus rhythm with left atrial enlargement, low voltage QRS, borderline EKG, but no significant change since prior EKG done in October 2013. No radiologic studies were performed.   HOSPITAL COURSE:  1. The patient was admitted to the Intensive Care Unit due to significant anemia. She was transfused with 2 units packed red blood cells on 04/28/2013. After transfusion, patient's hemoglobin improved from  6.7 down to 6.0 and up to 8.5 on 04/29/2013. The patient did not have any recurrent bleeding. It was felt that the patient's bleeding is gastrointestinal. A Gastroenterology consultation was obtained, and patient proceeded to upper GI endoscopy, which showed hiatal hernia as well as possible Cameron erosions. Dr. Candace Cruise felt that patient's  proton pump inhibitor should be advanced to twice daily dose as well as increased to 40 mg dose. He recommended to consider Nissen fundoplication to prevent recurrent erosions if patient has persistent bleeding or significant anemia. The patient was initiated on liquid diet initially, which was advanced to regular diet as patient was able to tolerate it, and there was no rebleeding. On day of discharge, patient felt satisfactory, did not complain of any significant discomfort. Her vitals were stable, with temperature 97.6, pulse ranging from 80s to 96, respiration rate was 18 to 31, blood pressure ranging from 174 systolic to 081 systolic. It was felt that patient's acute hemorrhagic anemia was GI related. As mentioned above, patient had EGD and she is being discharged on advanced doses of PPI.   2.  For hypertension, which was noted in the hospital, patient's blood pressure improved with conservative measures. She is, however, to resume her hydrochlorothiazide/lisinopril, which was held during her stay in the hospital time. However, she is to resume this medication at home. She is to follow up with her primary physician, Dr. Venia Minks, and make decisions about advancement of her blood pressure medications if needed.   3.  The patient was felt to have a urinary tract infection. She was started on Levaquin while in the hospital, and received a few days of therapy. The patient is to follow up with her primary care physician and be reassessed as outpatient if needed.   4.  The patient was noted to be slightly hypokalemic, with potassium level of 3.2 on 04/28/2013. The patient's potassium level was supplemented IV. Her magnesium level was also checked, was found to be low, and was also supplemented IV. Repeat potassium level is normal at 3.6.   5.  It is recommended to follow patient's hemoglobin levels as outpatient and make decisions about advancing her iron supplements or starting her on iron IV if needed. The  patient was also evaluated by Dr. Grayland Ormond while in the hospital on 05/03/2013 DR Grayland Ormond felt that  patient's anemia, given the history of melena as well as coffee-ground emesis, very likely is from GI source. He recommended to consider bone marrow biopsy if patient's EGD was unrevealing. However, since patient's EGD was concerning for Cameron erosions and possible bleeding from those [facility] erosions, no further recommendations were made. The patient is to follow up with Dr. Grayland Ormond on as-needed basis.   The patient is being discharged in stable condition with above-mentioned medications and followup.   Time spent:  40 minutes.   ____________________________ Theodoro Grist, MD rv:mr D: 04/29/2013 18:31:00 ET T: 04/29/2013 20:14:01 ET JOB#: [id number]  cc: Jerrell Belfast, MD Theodoro Grist, MD, <Dictator>   [place] MD ELECTRONICALLY SIGNED 05/15/2013 15:36

## 2015-04-19 NOTE — Consult Note (Signed)
Pt seen and examined. Full consult to follow. Pt now agreeable to see GI for consult. One episode of coffee ground emesis yest. Dark stools but patient on chronic Fe. Drop in hgb. Received 1 unit of blood. To receive another unit today. No GI sxs today. No food or liquids since yest. Pt agreeable to have EGD today. Will schedule. Thanks.  Electronic Signatures: Lutricia Feilh, Kasper Mudrick (MD)  (Signed on 02-May-14 12:37)  Authored  Last Updated: 02-May-14 12:37 by Lutricia Feilh, Carlesha Seiple (MD)

## 2015-04-19 NOTE — H&P (Signed)
PATIENT NAME:  Michelle Barnett, Michelle Barnett MR#:  [id number] DATE OF BIRTH:  07/11/1961  DATE OF ADMISSION:  04/28/2013  PRIMARY CARE PHYSICIAN: Dr. Nilda Simmer.   REFERRING PHYSICIAN: Dr. Mindi Junker.   CHIEF COMPLAINT: Hematemesis, weakness and tiredness.   HISTORY OF PRESENT ILLNESS: The patient is a 54 year old Caucasian female with a past medical history of chronic anemia. Has been following with Dr. Orlie Dakin as an outpatient. She is presenting to the ER after she had coffee-ground emesis at work at around 7 p.m. The patient denies any abdominal pain or diarrhea following that. She sees Dr. Orlie Dakin on a regular basis and approximately 1 week ago, she had received 2 units of blood transfusion as her hemoglobin was at 6.1 at that time. She had EGD done by Dr. 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 The patient's hemoglobin was found to be at 6.7 today, regarding which 1 unit of blood transfusion was ordered by the Emergency Room physician and hospitalist team is called to admit the patient. The patient was initially tachycardic and also complaining of fatigue and weakness. Denies any dizziness or loss of consciousness. Denies any chest pain or shortness of breath. She denies any similar complaints in the past 1 year. The patient also is requesting to see Dr. Orlie Dakin in the morning, her hematology doctor, following which she wants to decide about the GI consult. She is refusing to see her previous gastroenterologist, Dr. Marva Panda, regarding any procedures in the a.m.   PAST MEDICAL HISTORY: Chronic history of anemia, hypertension, peptic ulcer disease.   PAST SURGICAL HISTORY: Colonoscopy and EGD in the year 2012.   ALLERGIES: AMOXICILLIN, ASPIRIN, ALL DAIRY PRODUCTS.   HOME MEDICATIONS: Omeprazole 20 mg 2 times a day, multivitamin once daily, iron 65 mg once daily, hydrochlorothiazide 1 tablet p.o. once daily, folic acid 0.4 mg 2 times a day, calcium 600 with  vitamin D 1 tablet p.o. 2 times a day, vitamin B12 1000 mcg once daily   PSYCHOSOCIAL HISTORY: Lives at home. Denies any smoking, alcohol or illicit drug usage.   FAMILY HISTORY: Mom had history of stroke, and dad had history of coronary artery disease.   REVIEW OF SYSTEMS:  CONSTITUTIONAL: Complaining of fatigue but denies any fever. Complaining of weakness and dizziness. Denies any abdominal pain, weight loss or weight gain.  EYES: No blurry vision, glaucoma or cataracts.  ENT: Denies any tinnitus, epistaxis, discharge, snoring or postnasal drip.  RESPIRATION: Denies any cough, wheezing or COPD.   CARDIOVASCULAR: Denies any chest pain or palpitations. Denies any syncope.  GASTROINTESTINAL: Denies any nausea or vomiting. Denies any diarrhea but complaining of hematemesis which was coffee-ground in color. Denies any abdominal pain or melena. No history of jaundice or rectal bleeding.  GENITOURINARY: No dysuria or hematuria.  GYNECOLOGIC AND BREASTS: No breast mass or vaginal discharge.  ENDOCRINE: No polyuria, nocturia or thyroid problems.  HEMATOLOGIC AND LYMPHATIC: Has chronic history of anemia but denies any bleeding or easy bruising but complaining of hematemesis.  MUSCULOSKELETAL: Denies any pain in the neck, back, shoulder. No history of gout or arthritis.  NEUROLOGIC: Denies any history of vertigo, ataxia, dementia.  PSYCHIATRIC: Denies any history of ADD, OCD, bipolar disorder.   PHYSICAL EXAMINATION:  VITAL SIGNS: Temperature 99 degrees Fahrenheit, pulse is 98, respirations 24, blood pressure 163/56, pulse ox 96%.  GENERAL APPEARANCE: Not in any acute distress. Moderately built and moderately nourished.  HEENT: Normocephalic, atraumatic. Pupils are equally reacting to light and accommodation.  No scleral icterus. No conjunctival injection. Extraocular movements are intact. No sinus tenderness. No postnasal drip. No pharyngeal exudates.  NECK: Supple. No JVD. No thyromegaly. No  lymphadenopathy.  LUNGS: Clear to auscultation bilaterally. No accessory muscle usage. No anterior chest wall tenderness on palpation.  CARDIAC: S1, S2 normal. Regular rate and rhythm. Positive ejection systolic murmur. No edema.  GASTROINTESTINAL: Soft. Bowel sounds are positive in all 4 quadrants. Nontender, nondistended. No masses felt. No hepatosplenomegaly.  NEUROLOGIC: Awake, alert, oriented x3. Motor and sensory are grossly intact. Cranial nerves II through XII are grossly intact. Reflexes are 2+.  EXTREMITIES: No edema. No cyanosis and no clubbing.  SKIN: Warm to touch. Normal turgor. No rashes and no lesions noticed.  MUSCULOSKELETAL: No joint effusion, tenderness or erythema.  PSYCHIATRIC: Normal mood and affect.   LABS AND IMAGING STUDIES: Glucose is 111, BUN 28, creatinine 0.81, sodium 139, potassium 3.8, chloride 104, CO2 29. GFR greater than 60. Anion gap 6. Serum osmolality 284. Calcium 8.8. Total protein 6.0. Albumin 3.1. Total bilirubin 0.2, alkaline phosphatase 43, AST 14, ALT 18. Troponin less than 0.02. WBC 7.2, hemoglobin 6.7, hematocrit 22.0, platelets 263. Urinalysis yellow in color, cloudy in appearance, glucose negative, bili negative, ketones negative, specific gravity 1.020, leukocyte esterase 3+, nitrites negative, hyaline casts 3 in low-power field.   ASSESSMENT AND PLAN: A 54 year old female presenting to the ER with a chief complaint of hemoptysis as well as racing of the heart following hematemesis. Will be admitted with the following assessment and plan:  1. Hematemesis: Will keep her n.p.o. except meds and ice chips. The patient was started on a Protonix drip. Will provide her intravenous fluids. The patient has refused a gastrointestinal consult with Dr. Reyes IvanSkulskie's group. She is refusing any other gastrointestinal consult at this time. She prefers talking to Dr. Orlie DakinFinnegan, her hematologist, in the morning before she considers gastrointestinal consult. Will provide her 2  units of blood transfusion, each unit in 4 hours. Will check PT and PTT in a.m. Will also get blood work to check blood counts after 2 units of blood transfusion.  2. Acute on chronic anemia of unclear etiology: Hematology/oncology consult is placed to Dr. Orlie DakinFinnegan.  3. Urinary tract infection: Urine culture is ordered. The patient will be on levofloxacin.  4. Peptic ulcer disease: The patient is currently on Protonix drip.  5. Hypertension: Will resume her home medication.  6. Deep vein thrombosis prophylaxis with SCDs.    TOTAL CRITICAL CARE TIME SPENT: 50 minutes.   The diagnosis and plan of care was discussed in detail with the patient. She is aware of the plan. CCU nurse will notify Dr. Earnest ConroyElliott's group regarding cancelation of the GI consult as the patient is refusing any GI consults at this time.   She is FULL CODE.    ____________________________ Ramonita LabAruna Esraa Seres, MD ag:gb D: 04/28/2013 02:46:23 ET T: 04/28/2013 03:59:53 ET JOB#: [id number]  cc: Ramonita LabAruna Ivan Lacher, MD, <Dictator> Myrle ShengKristi M. Katrinka BlazingSmith, MD Tollie Pizzaimothy J. Orlie DakinFinnegan, MD Ramonita LabARUNA Danila Eddie MD ELECTRONICALLY SIGNED 05/08/2013 0:48

## 2015-04-19 NOTE — Consult Note (Signed)
Nurse said the patient was ADAMANT that she wanted to talk to Dr. Orlie DakinFinnegan before she has a consultation with GI.  In that case the doctor for the consult will be Dr. Bluford Kaufmannh who is on call today.  Please contact him if the patient needs a GI consult.  Electronic Signatures: Scot JunElliott, Naylea Wigington T (MD)  (Signed on 02-May-14 06:41)  Authored  Last Updated: 02-May-14 06:41 by Scot JunElliott, Arnitra Sokoloski T (MD)

## 2015-04-19 NOTE — Consult Note (Signed)
PATIENT NAME:  Michelle GuppyCHAMBERS, Lakelyn F MR#:  [id number] DATE OF BIRTH:  03-Jun-1961  DATE OF CONSULTATION:  04/28/2013  CONSULTING PHYSICIAN:  Ezzard StandingPaul Y. Kentarius Partington, MD  REASON FOR REFERRAL: Coffee-ground emesis.   DESCRIPTION: The patient is a 54 year old white female with a history of chronic anemia, on chronic iron supplementation. She came to the Emergency Room after 1 bout of coffee-ground emesis yesterday. The patient denied having any prior abdominal pain or heartburn. She did feel a little nauseous beforehand. Since then, she has not had any further bleeding. When she was admitted, her hemoglobin was only 6.1. She has had a history of erosive gastropathy in 2012. She also had a colonoscopy recently which was negative. I was asked to see her because of concern for recurrent GI bleeding. The patient already has received 1 unit of blood and is scheduled to receive another unit of blood.   Currently she feels fine. There is no dizziness or lightheadedness or abdominal pain or nausea at this time.   REVIEW OF SYSTEMS: There are no fevers or chills or weight changes. There is no chest pain or palpitation. No coughing or shortness of breath. GI symptoms have been described already.   PAST MEDICAL HISTORY: Includes history of anemia and hypertension and history of ulcer disease.   ALLERGIES: SHE IS ALLERGIC TO AMOXICILLIN, ASPIRIN AND ALL DAIRY PRODUCTS.   HOME MEDICATIONS: Include omeprazole twice a day, iron daily, hydrochlorothiazide, folic acid, calcium and vitamins.   SOCIAL HISTORY: She denies any smoking or alcohol.   FAMILY HISTORY: Notable for coronary artery disease and stroke.   PHYSICAL EXAMINATION:  GENERAL: The patient is in no acute distress.  VITAL SIGNS: She is afebrile. Vital signs are stable.  CARDIAC: Regular rhythm and rate without murmurs.  LUNGS: Clear bilaterally.  ABDOMEN: Normoactive bowel sounds. It was soft, nontender. There was no hepatomegaly. She had no palpable masses.   EXTREMITIES: No clubbing, cyanosis or edema.  NEUROLOGIC: Nonfocal.  SKIN: Negative.   LABORATORY DATA: This morning showed sodium 140, potassium 3.2, creatinine 0.69, BUN 23, glucose 107. Liver enzymes were normal yesterday. Her hemoglobin was 6.0 this morning. It was 6.7 last night. INR is normal.   ASSESSMENT AND PLAN: This is a patient with chronic anemia who has episode of coffee-ground emesis. She has upper gastrointestinal bleeding. We discussed doing an upper endoscopy with her today to evaluate for the site of bleeding. The patient agreed to have it done. The patient will receive another unit of blood today. She is already on Protonix b.i.d.   Thank you for the referral.   ____________________________ Ezzard StandingPaul Y. Bluford Kaufmannh, MD pyo:jm D: 04/28/2013 13:32:32 ET T: 04/28/2013 14:11:57 ET JOB#: [id number]  cc: Ezzard StandingPaul Y. Bluford Kaufmannh, MD, <Dictator> Ezzard StandingPAUL Y Elza Sortor MD ELECTRONICALLY SIGNED 05/01/2013 13:19

## 2015-04-19 NOTE — Consult Note (Signed)
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 No active bleeding now. Full liquid diet. Advance as tolerated. PPI bid. Moniter hgb as tolerated. If stable, ok for discharge by tomorrow. If persistent bleeding, consider Nissen fundoplication to prevent recurrent erosions. Pt requests something other than prilosec for discharge. Recommend writing for protonix 40mg  bid upon discharge. Will sign off. Thanks.   Electronic Signatures: Lutricia Feilh, Boubacar Lerette (MD) (Signed on 02-May-14 13:27)  Authored   Last Updated: 02-May-14 14:03 by Lutricia Feilh, Khandi Kernes (MD)

## 2015-04-19 NOTE — Discharge Summary (Signed)
PATIENT NAME:  Michelle GuppyCHAMBERS, Arlita F MR#:  [id number] DATE OF BIRTH:  Jan 25, 1961  DATE OF ADMISSION:  10/31/2013 DATE OF DISCHARGE:  11/03/2013  ADMISSION DIAGNOSIS:  1.  Acute respiratory failure.   DISCHARGE DIAGNOSES: 1.  Acute respiratory failure secondary to acute bronchitis.  2.  Acute bronchitis. 3.  Hypertension.  4.  Electrolyte abnormalities.  5.  Elevated white blood cells from steroids.  CONSULTATIONS: None.  LABORATORIES AT DISCHARGE:  White blood  cells 12, hemoglobin 12.4, hematocrit 36.6. Platelets are 250. Magnesium is 2.6. Potassium 3.6.  A 2-D echocardiogram showed an ejection fraction of 65% to 70% with elevated mean left atrial pressures, mildly dilated right atrium, mild mitral valve regurg and mild TR.   HOSPITAL COURSE: This is a 54 year old female who presented with acute respiratory failure. For further details, please refer to the H and P.   1.  Acutely respiratory failure.  The patient presented with shortness of breath and wheezing. She has no history of smoking. No history of asthma or COPD.  She was treated for acute bronchitis/reactive airway disease.  2.  Acute bronchitis.  The patient was started on azithromycin. She had been on Levaquin as an outpatient. There is no evidence of pneumonia on chest x-ray. She was started on IV steroids. She is actually doing quite well. She has some wheezing, but is not hypoxic on room air. She has good airflow on her lung exam and she is not dyspneic.  3.  Elevated hypertension.  We increased her dose of hydrochlorothiazide and lisinopril for better blood pressure control.  4.  For her electrolyte abnormalities, we repleted the potassium  5.  Elevated white blood cells thought to be  secondary to steroids.   DISCHARGE MEDICATIONS: 1.  Azithromycin 250 mg x 2 days.  2.  Prednisone taper starting at 50 mg, taper by 10 mg every 2 days. 3.  Hydrochlorothiazide/lisinopril 12.5/10, 2 tablets daily.  4.  Advair Diskus 250/50 b.i.d.   5.  Omeprazole 40 mg daily.   DISCHARGE DIET: Low sodium.    DISCHARGE ACTIVITY:  As tolerated.  DISCHARGE FOLLOWUP:  The patient will follow up with Dr. Juel BurrowMasoud in 1 to 2 weeks.    The patient is medically stable for discharge.   TIME SPENT:  Approximately 35 minutes.    ____________________________ Janyth ContesSital P. Juliene PinaMody, MD spm:dmm D: 11/03/2013 20:11:00 ET T: 11/03/2013 20:46:31 ET JOB#: [id number]  cc: Corky DownsJaved Masoud, MD Merdith Adan P. Juliene PinaMody, MD, <Dictator> Janyth ContesSITAL P Amylia Collazos MD ELECTRONICALLY SIGNED 11/06/2013 13:45

## 2015-04-19 NOTE — H&P (Signed)
PATIENT NAME:  Michelle Barnett, Michelle Barnett MR#:  [id number] DATE OF BIRTH:  [birth year]  DATE OF ADMISSION:  10/31/2013  PRIMARY CARE PHYSICIAN: Dr. Juel Burrow.   REFERRING PHYSICIAN: Dr. Margarita Grizzle.   CHIEF COMPLAINT: Shortness of breath, cough, sputum for 3 days.   HISTORY OF PRESENT ILLNESS: The patient is a 54 year old Caucasian female with a history of hypertension, heart murmur, anemia, GI bleeding and hiatal hernia, who presented to the ED with above chief complaint. The patient started to have cough, sputum, shortness of breath about 3 days ago. Symptoms have been worsening. She used a nebulizer without improvement. She was treated with Levaquin without any improvement, so she came to the ED for further evaluation. The patient was treated with prednisone  in the ED. She was planned to be discharged home, but the patient's O2 oxygen saturation dropped to 82. She still had a lot of cough, congestion, chest tightness and shortness of breath, but the patient denies any fever or chills. No palpitations, orthopnea or nocturnal dyspnea. No weight gain or leg edema recently. The patient said that she had similar symptoms last year, but this time it is  worse than last year.   PAST MEDICAL HISTORY: Acute gastrointestinal bleeding, hiatal hernia, anemia, erosive gastropathy, hypertension, heart murmur and urinary tract infection.   SOCIAL HISTORY: No smoking, drinking or illicit drugs.   PAST SURGICAL HISTORY: Right arm surgery, left breast benign tumor removal.   FAMILY HISTORY: Hypertension. Mother had a stroke.   ALLERGIES: AMOXICILLIN, ASPIRIN, ALL DAIRY AND PROZAC.   HOME MEDICATIONS: Omeprazole 40 mg p.o. daily, Levaquin 750 mg p.o. daily, hydrochlorothiazide/lisinopril 12.5 mg/10 mg p.o. 1 tablet once a day.   REVIEW OF SYSTEMS: CONSTITUTIONAL: The patient denies any fever or chills. No headache or dizziness. No weakness. EYES: No double or blurry vision. ENT: No postnasal drip, slurred speech or  dysphagia. CARDIOVASCULAR: Has chest tightness. No palpitations, orthopnea, or nocturnal dyspnea. No leg edema. PULMONARY: Positive for cough, sputum, shortness of breath and hematemesis. The patient had hematemesis twice this morning, which was small. GASTROINTESTINAL: No abdominal pain, nausea, vomiting or diarrhea. No melena or bloody stool. GENITOURINARY: No dysuria, hematuria, or incontinence. SKIN: No rash or jaundice. NEUROLOGY: No syncope, loss of consciousness or seizure. ENDOCRINE: No polyuria, polydipsia, heat or cold intolerance. HEMATOLOGY: No easy bruising or bleeding.   PHYSICAL EXAMINATION:  VITAL SIGNS: Temperature 99.4, blood pressure 141/74, pulse 124, respirations 23, oxygen saturation 94 on oxygen.  GENERAL: The patient is alert, awake, oriented, in no acute distress.  HEENT: Pupils round, equal and reactive to light and accommodation. Moist oral mucosa. Clear oropharynx.  NECK: Supple. No JVD or carotid bruit. No lymphadenopathy. No thyromegaly.  CARDIOVASCULAR: S1, S2 regular rate and rhythm. No murmurs or gallops.  PULMONARY: Bilateral air entry. Mild wheezing with severe crackles in both lungs. No use of accessory muscles to breathe.  ABDOMEN: Soft. No distention or tenderness. No organomegaly. Bowel sounds present.  EXTREMITIES: No edema, clubbing or cyanosis. No calf tenderness. Strong bilateral pedal pulses.  SKIN: No rash or jaundice.  NEUROLOGY: A and O x 3. No focal deficit. Power 5 over 5. Sensation intact.   LABORATORY DATA: WBC 11.7, hemoglobin 13.1, platelets 209. Glucose 124, BUN 7, creatinine 0.6, sodium 136, potassium 2.9, chloride 101, bicarbonate 32. Chest x-ray showed no acute cardiopulmonary process.   IMPRESSION:   1.  Hypoxia with acute respiratory failure.  2.  Acute bronchitis, questionable chronic obstructive pulmonary disease.  3.  Tachycardia.  4.  Hypokalemia.  5.  Hypertension.   PLAN OF TREATMENT:  1.  The patient will be admitted to a  medical floor. We will continue O2 by nasal cannula. Start Solu-Medrol,xopenex,  Zithromax and give Robitussin p.r.n. for cough.  2.  In addition, since the patient has harsh murmurs, we will get an echocardiogram to evaluate the patient's cardiac function.  3.  Congestion and chest heaviness, we will get a echocardiogram to evaluate the cardiac function.  4.  For hypokalemia, we will give potassium supplement and follow up potassium and magnesium level.  5.  Continue hydrochlorothiazide/lisinopril for hypertension.  6.  GI and DVT prophylaxis.  7.  I discussed the patient's condition and plan of treatment with the patient. The patient wants full code.   TIME SPENT: About 52 minutes.   ____________________________ Shaune PollackQing Analeya Luallen, MD qc:aw D: 10/31/2013 10:49:56 ET T: 10/31/2013 11:06:57 ET JOB#: [id number]  cc: Shaune PollackQing Navia Lindahl, MD, <Dictator> Shaune PollackQING Chilton Sallade MD ELECTRONICALLY SIGNED 11/01/2013 15:24

## 2015-04-19 NOTE — Consult Note (Signed)
History of Present Illness:  Reason for Consult Symptomatic anemia.   HPI   Patient last seen in clinic one week ago she is found to have a hemoglobin of 6.1 and received 2 units of packed red blood cells.  Patient reports feeling better for 3-4 days after receiving her transfusion, but then she noted increasing weakness and fatigue, one episode of melena and subsequently some coffee-ground emesis.  She also admitted to some heart palpitations.  Currently she feels improved, but not back to her baseline.  She has no neurologic complaints.  She denies chest pain, but continues to have dyspnea on exertion.  She also continues to work full-time.  She has a good appetite and denies weight loss.  She has no urinary complaints.  Patient offers no further specific complaints.  PFSH:  Additional Past Medical and Surgical History Chronic anemia, hypertension. Abdominal surgery for Meckel's diverticulum, appendectomy, D&C.  Family history: CVA, CAD.  Social history: Denies tobacco or alcohol.  Currently works in Contractor at [employer].   Review of Systems:  Performance Status (ECOG) 0   Review of Systems   As per HPI. Otherwise, 10 point system review was negative.  NURSING NOTES: **Vital Signs.:   02-May-14 12:20   Vital Signs Type: Blood Transfusion Complete   Temperature Temperature (F): 98.4   Celsius: 36.8   Temperature Source: oral   Pulse Pulse: 85   Respirations Respirations: 46   Systolic BP Systolic BP: 628   Diastolic BP (mmHg) Diastolic BP (mmHg): 53   Mean BP: 71   BP Source  if not from Vital Sign Device: non-invasive   Pulse Ox % Pulse Ox %: 98   Oxygen Delivery: Room Air/ 21 %   Physical Exam:  Physical Exam General: Well-developed, well-nourished, no acute distress. Eyes: Pale conjunctiva, anicteric sclera. Lungs: Clear to auscultation bilaterally. Heart: Regular rate and rhythm. No rubs, murmurs, or gallops. Abdomen: Soft, nontender, nondistended. No  organomegaly noted, normoactive bowel sounds. Musculoskeletal: No edema, cyanosis, or clubbing. Neuro: Alert, answering all questions appropriately. Cranial nerves grossly intact. Skin: No rashes or petechiae noted. Psych: Normal affect.    ASA: Unknown  Amoxicillin: Dizzy/Fainting  All Dairy Products: N/V/Diarrhea    hydrochlorothiazide-lisinopril 12.5 mg-10 mg oral tablet: 1 tab(s) orally once a day, Status: Active, Quantity: 0, Refills: None   B 12: 1057mg daily, Status: Active, Quantity: 0, Refills: None   folic acid 0.4 mg oral tablet: 2  orally   daily, Status: Active, Quantity: 0, Refills: None   Calcium 600+D 600 mg-200 intl units oral tablet: 1 tab(s) orally 2 times a day, Status: Active, Quantity: 0, Refills: None   Ester-C 1000 mg oral tablet: 1 tab(s) orally once a day, Status: Active, Quantity: 0, Refills: None   omeprazole 20 mg oral enteric coated tablet: 2 daily, Status: Active, Quantity: 0, Refills: None   multivitamin:    daily, Status: Active, Quantity: 0, Refills: None   iron 620m daily, Status: Active, Quantity: 0, Refills: None  Laboratory Results: Routine Chem:  02-May-14 04:07   Result Comment pt/inr aptt - cancel. specimen clotted.  - c/vicky scott, rn  Result(s) reported on 28 Apr 2013 at 05:14AM.  Glucose, Serum  107  BUN  23  Creatinine (comp) 0.69  Sodium, Serum 140  Potassium, Serum  3.2  Chloride, Serum 106  CO2, Serum 31  Calcium (Total), Serum 8.5  Anion Gap  3  Osmolality (calc) 284  eGFR (African American) >60  eGFR (Non-African American) >60 (eGFR  values <79m/min/1.73 m2 may be an indication of chronic kidney disease (CKD). Calculated eGFR is useful in patients with stable renal function. The eGFR calculation will not be reliable in acutely ill patients when serum creatinine is changing rapidly. It is not useful in  patients on dialysis. The eGFR calculation may not be applicable to patients at the low and high extremes of body  sizes, pregnant women, and vegetarians.)  Routine Coag:  02-May-14 04:07   Activated PTT (APTT) - (A HCT value >55% may artifactually increase the APTT. In one study, the increase was an average of 19%. Reference: "Effect on Routine and Special Coagulation Testing Values of Citrate Anticoagulant Adjustment in Patients with High HCT Values." American Journal of Clinical Pathology 2006;126:400-405.)  Prothrombin -  INR - (INR reference interval applies to patients on anticoagulant therapy. A single INR therapeutic range for coumarins is not optimal for all indications; however, the suggested range for most indications is 2.0 - 3.0. Exceptions to the INR Reference Range may include: Prosthetic heart valves, acute myocardial infarction, prevention of myocardial infarction, and combinations of aspirin and anticoagulant. The need for a higher or lower target INR must be assessed individually. Reference: The Pharmacology and Management of the Vitamin K  antagonists: the seventh ACCP Conference on Antithrombotic and Thrombolytic Therapy. CPQDIY.6415Sept:126 (3suppl): [id number] A HCT value >55% may artifactually increase the PT.  In one study,  the increase was an average of 25%. Reference:  "Effect on Routine and Special Coagulation Testing Values of Citrate Anticoagulant Adjustment in Patients with High HCT Values." American Journal of Clinical Pathology 2006;126:400-405.)  Routine Hem:  02-May-14 04:07   WBC (CBC) 6.9  RBC (CBC)  2.37  Hemoglobin (CBC)  6.0  Hematocrit (CBC)  19.9  Platelet Count (CBC) 237  MCV 84  MCH  25.4  MCHC  30.4  RDW  16.3  Neutrophil % 85.1  Lymphocyte % 6.5  Monocyte % 7.4  Eosinophil % 0.4  Basophil % 0.6  Neutrophil # 5.9  Lymphocyte #  0.4  Monocyte # 0.5  Eosinophil # 0.0  Basophil # 0.0   Assessment and Plan: Impression:   Anemia, likely from GI source Plan:   1.  Anemia: Given patient's recent history of melana and coffee-ground emesis,  her anemia is likely from a GI source.  Appreciate GI input and patient will have EGD later today.  Previously, the remainder of her laboratory work including hemolysis labs was negative.  If EGD is unrevealing, patient will likely require bone marrow biopsy for further evaluation in the next several weeks.  This can be completed as an outpatient.  Agree with 2 units of packed red blood cells in an actively bleeding patient.  Attempt to maintain hemoglobin greater than 7.0.   consult, will follow.  Electronic Signatures: FDelight Hoh(MD)  (Signed [id number])  Authored: HISTORY OF PRESENT ILLNESS, PFSH, ROS, NURSING NOTES, PE, ALLERGIES, HOME MEDICATIONS, LABS, ASSESSMENT AND PLAN   Last Updated: 02-May-14 13:05 by FDelight Hoh(MD)

## 2015-06-07 ENCOUNTER — Telehealth: Payer: Self-pay | Admitting: Family Medicine

## 2015-06-07 NOTE — Telephone Encounter (Signed)
[facility] made appt on 06/10/15 at 10:45 am with Dr Dayton Martes.

## 2015-06-07 NOTE — Telephone Encounter (Signed)
Patient Name: ZAHYRA BLITZ  DOB: [birth year]    Initial Comment Caller states she has been seeing lines in right eye.    Nurse Assessment  Nurse: Deatra James RN, Corrie Dandy Date/Time Lamount Cohen Time): 06/07/2015 11:45:00 AM  Confirm and document reason for call. If symptomatic, describe symptoms. ---Patient states she saw lines in her right eye this morning now she can see one circle. She has had this before and it was her potassium and they gave her tablets for this and it cleared up.  Has the patient traveled out of the country within the last 30 days? ---No  Does the patient require triage? ---Yes  Related visit to physician within the last 2 weeks? ---No  Does the PT have any chronic conditions? (i.e. diabetes, asthma, etc.) ---Yes  List chronic conditions. ---"HTN takes Lisinopril/ HCTZ     Guidelines    Guideline Title Affirmed Question Affirmed Notes  Vision Loss or Change Single floater (i.e., small speck seems to float across the eye)    Final Disposition User   See PCP When Office is Open (within 3 days) Noe, RN, [facility]    Patient states she is drinking orange juice to increase her potassium

## 2015-06-07 NOTE — Telephone Encounter (Signed)
PLEASE NOTE: All timestamps contained within this report are represented as [place] Standard Time. CONFIDENTIALTY NOTICE: This fax transmission is intended only for the addressee. It contains information that is legally privileged, confidential or otherwise protected from use or disclosure. If you are not the intended recipient, you are strictly prohibited from reviewing, disclosing, copying using or disseminating any of this information or taking any action in reliance on or regarding this information. If you have received this fax in error, please notify us immediately by telephone so that we can arrange for its return to [place]. Phone: [phone], Toll-Free: [phone], Fax: [phone] Page: 1 of 2 Call Id: [id number] [place] [facility] Day - Client TELEPHONE ADVICE RECORD [facility] Medical Call Center Patient Name: Michelle Barnett Gender: Female DOB: [birth year] Age: 54 Y 2 M 11 D Return Phone Number: [phone] (Primary) Address: City/State/Zip: [place] Client [facility] Day - Client Client Site [facility] - Day Physician Ruthe Mannan Contact Type Call Call Type Triage / Clinical Relationship To Patient Self Appointment Disposition EMR Appointment Scheduled Info pasted into Epic Yes Return Phone Number [phone] (Primary) Chief Complaint Vision - (non urgent symptoms) Initial Comment Caller states she has been seeing lines in right eye. GOTO Facility Not Listed appt scheduled for 6/13 @ 10:45am PreDisposition Call Doctor Nurse Assessment Nurse: Deatra James, RN, Corrie Dandy Date/Time Lamount Cohen Time): 06/07/2015 11:45:00 AM Confirm and document reason for call. If symptomatic, describe symptoms. ---Patient states she saw lines in her right eye this morning now she can see one circle. She has had this before and it was her potassium and they gave her tablets for this and it cleared up. Has the patient traveled out of the country within  the last 30 days? ---No Does the patient require triage? ---Yes Related visit to physician within the last 2 weeks? ---No Does the PT have any chronic conditions? (i.e. diabetes, asthma, etc.) ---Yes List chronic conditions. ---"HTN takes Lisinopril/ HCTZ Guidelines Guideline Title Affirmed Question Affirmed Notes Nurse Date/Time (Eastern Time) Vision Loss or Change Single floater (i.e., small speck seems to float across the eye) Deatra James, RN, [facility] 06/07/2015 11:48:03 AM Disp. Time Lamount Cohen Time) Disposition Final User 06/07/2015 11:34:45 AM Attempt made - message left Deatra James RNCorrie Dandy 06/07/2015 11:54:13 AM See PCP When Office is Open (within 3 days) Yes Noe, RN, Jenelle Mages Understands: Yes PLEASE NOTE: All timestamps contained within this report are represented as [place] Standard Time. CONFIDENTIALTY NOTICE: This fax transmission is intended only for the addressee. It contains information that is legally privileged, confidential or otherwise protected from use or disclosure. If you are not the intended recipient, you are strictly prohibited from reviewing, disclosing, copying using or disseminating any of this information or taking any action in reliance on or regarding this information. If you have received this fax in error, please notify us immediately by telephone so that we can arrange for its return to [place]. Phone: [phone], Toll-Free: [phone], Fax: [phone] Page: 2 of 2 Call Id: [id number] Disagree/Comply: Comply Care Advice Given Per Guideline SEE PCP WITHIN 3 DAYS: You need to be examined within 2 or 3 days. Call your doctor during regular office hours and make an appointment. (Note: if office will be open tomorrow, tell caller to call then, not in 3 days). ALTERNATE DISPOSITION - CALL OPHTHALMOLOGIST WHEN OFFICE IS OPEN: If you have a private ophthalmologist (eye doctor), you need to discuss this with your doctor within the next few days. Call him/her during  regular  office hours. You will need to be examined. * Floaters are small particles (clumps of tiny cells) drifting quietly within the fluid (vitreous) of the eyeball. They are usually noticed while staring at the sky or a white background. While they can be distracting, they are usually harmless. There is no current way to prevent them. REASSURANCE: * No treatment is needed at this time. CALL BACK IF: * Floaters increase in number (i.e., 'showers') * Floaters interfere with vision * A 'curtain comes down' and blocks vision * You become worse. CARE ADVICE given per Vision Loss or Change (Adult) guideline. After Care Instructions Given Call Event Type User Date / Time Description

## 2015-06-10 ENCOUNTER — Ambulatory Visit: Payer: 59 | Admitting: Family Medicine

## 2015-08-08 ENCOUNTER — Other Ambulatory Visit: Payer: Self-pay

## 2015-08-08 NOTE — Telephone Encounter (Signed)
Pt request to change pharmacy info to [facility] [address]. Pt does not need refills now. Advised pt done.

## 2016-01-23 ENCOUNTER — Other Ambulatory Visit: Payer: Self-pay

## 2016-01-23 MED ORDER — LISINOPRIL-HYDROCHLOROTHIAZIDE 10-12.5 MG PO TABS: 1.0000 | ORAL_TABLET | Freq: Every day | ORAL | Status: DC

## 2016-01-23 NOTE — Telephone Encounter (Signed)
Pt left v/m requesting BP med refill; pt request cb.

## 2016-01-23 NOTE — Telephone Encounter (Signed)
[facility] [address] left v/m requesting refill lisinopril-HCTZ. Last refilled # 30 x 2 on 11/27/2014. Pt last seen for f/u on 08/17/14.No future appt scheduled.Please advise.

## 2016-01-23 NOTE — Telephone Encounter (Signed)
Refill one time only. Needs to be seen for further refills.

## 2016-01-27 ENCOUNTER — Encounter: Payer: Self-pay | Admitting: Family Medicine

## 2016-01-27 ENCOUNTER — Ambulatory Visit (INDEPENDENT_AMBULATORY_CARE_PROVIDER_SITE_OTHER): Payer: Managed Care, Other (non HMO) | Admitting: Family Medicine

## 2016-01-27 VITALS — BP 136/82 | HR 80 | Temp 98.0°F | Wt 144.8 lb

## 2016-01-27 DIAGNOSIS — F4323 Adjustment disorder with mixed anxiety and depressed mood: Secondary | ICD-10-CM | POA: Diagnosis not present

## 2016-01-27 DIAGNOSIS — E669 Obesity, unspecified: Secondary | ICD-10-CM | POA: Diagnosis not present

## 2016-01-27 DIAGNOSIS — Z23 Encounter for immunization: Secondary | ICD-10-CM | POA: Diagnosis not present

## 2016-01-27 DIAGNOSIS — I10 Essential (primary) hypertension: Secondary | ICD-10-CM

## 2016-01-27 DIAGNOSIS — D649 Anemia, unspecified: Secondary | ICD-10-CM | POA: Diagnosis not present

## 2016-01-27 DIAGNOSIS — D509 Iron deficiency anemia, unspecified: Secondary | ICD-10-CM

## 2016-01-27 LAB — CBC WITH DIFFERENTIAL/PLATELET
BASOS PCT: 0.7 % (ref 0.0–3.0)
Basophils Absolute: 0 10*3/uL (ref 0.0–0.1)
EOS PCT: 2 % (ref 0.0–5.0)
Eosinophils Absolute: 0.1 10*3/uL (ref 0.0–0.7)
HCT: 40.7 % (ref 36.0–46.0)
Hemoglobin: 13.2 g/dL (ref 12.0–15.0)
LYMPHS ABS: 0.8 10*3/uL (ref 0.7–4.0)
Lymphocytes Relative: 19.9 % (ref 12.0–46.0)
MCHC: 32.4 g/dL (ref 30.0–36.0)
MCV: 93.2 fl (ref 78.0–100.0)
MONO ABS: 0.4 10*3/uL (ref 0.1–1.0)
Monocytes Relative: 10 % (ref 3.0–12.0)
NEUTROS PCT: 67.4 % (ref 43.0–77.0)
Neutro Abs: 2.5 10*3/uL (ref 1.4–7.7)
PLATELETS: 196 10*3/uL (ref 150.0–400.0)
RBC: 4.36 Mil/uL (ref 3.87–5.11)
RDW: 14.1 % (ref 11.5–15.5)
WBC: 3.8 10*3/uL — ABNORMAL LOW (ref 4.0–10.5)

## 2016-01-27 LAB — COMPREHENSIVE METABOLIC PANEL
ALT: 13 U/L (ref 0–35)
AST: 19 U/L (ref 0–37)
Albumin: 4 g/dL (ref 3.5–5.2)
Alkaline Phosphatase: 45 U/L (ref 39–117)
BUN: 14 mg/dL (ref 6–23)
CHLORIDE: 103 meq/L (ref 96–112)
CO2: 33 mEq/L — ABNORMAL HIGH (ref 19–32)
Calcium: 9.5 mg/dL (ref 8.4–10.5)
Creatinine, Ser: 0.73 mg/dL (ref 0.40–1.20)
GFR: 87.7 mL/min (ref >60.00)
GLUCOSE: 86 mg/dL (ref 70–99)
POTASSIUM: 3.9 meq/L (ref 3.5–5.1)
SODIUM: 142 meq/L (ref 135–145)
Total Bilirubin: 0.3 mg/dL (ref 0.2–1.2)
Total Protein: 6.9 g/dL (ref 6.0–8.3)

## 2016-01-27 LAB — LIPID PANEL
CHOLESTEROL: 177 mg/dL (ref 0–200)
HDL: 58.4 mg/dL (ref >39.00)
LDL CALC: 105 mg/dL — AB (ref 0–99)
NONHDL: 118.1
Total CHOL/HDL Ratio: 3
Triglycerides: 64 mg/dL (ref 0.0–149.0)
VLDL: 12.8 mg/dL (ref 0.0–40.0)

## 2016-01-27 MED ORDER — LISINOPRIL-HYDROCHLOROTHIAZIDE 10-12.5 MG PO TABS: 1.0000 | ORAL_TABLET | Freq: Every day | ORAL | Status: DC

## 2016-01-27 NOTE — Progress Notes (Signed)
Subjective:   Patient ID: Michelle Barnett, female    DOB: Jan 23, 1961, 55 y.o.   MRN: [id number]  Michelle Barnett is a pleasant 55 y.o. year old female who presents to clinic today with Follow-up and Medication Refill  on 01/27/2016  HPI:    Anemia- has had 19 blood transfusions since 2005.  Was seeing hematology. Per pt, Dr. Orlie Dakin "turned her lose."  He felt her severe anemia was due to UGI bleed which has since resolved. S/p uterine ablation as well. Also reports a B12 deficiency.   Taking iron daily. Lab Results  Component Value Date   WBC 6.1 12/17/2014   HGB 13.9 12/17/2014   HCT 44.1 12/17/2014   MCV 95 12/17/2014   PLT 204 12/17/2014   Lab Results  Component Value Date   VITAMINB12 1109* 07/30/2014    HTN- has been well controlled on Prinzide 10-12.5 mg daily. Denies any HA, blurred vision, CP or SOB. No LE edema. Lab Results  Component Value Date   CREATININE 0.66 12/17/2014   Has lost a lot of weight with diet and exercise.  "I feel great!"  Lab Results  Component Value Date   CHOL 210* 07/30/2014   HDL 61.20 07/30/2014   LDLCALC 110* 07/30/2014   TRIG 196.0* 07/30/2014   CHOLHDL 3 07/30/2014     Current Outpatient Prescriptions on File Prior to Visit  Medication Sig Dispense Refill  . ferrous sulfate 325 (65 FE) MG tablet Take 325 mg by mouth daily with breakfast.    . folic acid (FOLVITE) 800 MCG tablet Take 400 mcg by mouth daily.    Marland Kitchen omeprazole (PRILOSEC) 20 MG capsule Take 20 mg by mouth daily.    . VENTOLIN HFA 108 (90 BASE) MCG/ACT inhaler INHALE 2 PUFFS INTO THE LUNGS EVERY 6 (SIX) HOURS AS NEEDED FOR WHEEZING OR SHORTNESS OF BREATH. 18 Inhaler 1  . vitamin B-12 (CYANOCOBALAMIN) 1000 MCG tablet Take 1,000 mcg by mouth daily.     No current facility-administered medications on file prior to visit.    Allergies  Allergen Reactions  . Dairy Aid [Lactase] Swelling    Any dairy products  . Penicillins   . Benadryl [Diphenhydramine]  Palpitations    Past Medical History  Diagnosis Date  . Bronchitis   . Ulcer   . Heart murmur   . Hypertension   . History of blood transfusion   . Hiatal hernia     Past Surgical History  Procedure Laterality Date  . Ablation    . Tooth extraction    . Abdominal tumor      Family History  Problem Relation Age of Onset  . Stroke Mother   . Hypertension Mother   . Hypertension Father   . Heart disease Father   . Hypertension Brother     Social History   Social History  . Marital Status: Single    Spouse Name: N/A  . Number of Children: N/A  . Years of Education: N/A   Occupational History  . Not on file.   Social History Main Topics  . Smoking status: Never Smoker   . Smokeless tobacco: Never Used  . Alcohol Use: No  . Drug Use: No  . Sexual Activity: No   Other Topics Concern  . Not on file   Social History Narrative   The PMH, PSH, Social History, Family History, Medications, and allergies have been reviewed in [facility], and have been updated if relevant.   Review of Systems  Constitutional: Negative.   HENT: Negative.   Respiratory: Negative.   Cardiovascular: Negative.   Gastrointestinal: Negative.   Endocrine: Negative.   Genitourinary: Negative.   Musculoskeletal: Negative.   Skin: Negative.   Allergic/Immunologic: Negative.   Neurological: Negative.   Hematological: Negative.   Psychiatric/Behavioral: Negative.   All other systems reviewed and are negative.      Objective:    BP 136/82 mmHg  Pulse 80  Temp(Src) 98 F (36.7 C) (Oral)  Wt 144 lb 12 oz (65.658 kg)  SpO2 95%  Wt Readings from Last 3 Encounters:  01/27/16 144 lb 12 oz (65.658 kg)  08/17/14 178 lb 4 oz (80.854 kg)  07/30/14 177 lb 8 oz (80.513 kg)    BP Readings from Last 3 Encounters:  01/27/16 136/82  08/17/14 148/92  07/30/14 128/70   Lab Results  Component Value Date   IRON 65 07/30/2014   FERRITIN 32.3 07/30/2014    Physical Exam  Constitutional: She  appears well-developed and well-nourished. No distress.  HENT:  Head: Normocephalic and atraumatic.  Neck: Normal range of motion. Neck supple.  Abdominal: Soft. Bowel sounds are normal.  Musculoskeletal: Normal range of motion.  Skin: Skin is warm and dry. No erythema.  Psychiatric: Her behavior is normal. Judgment and thought content normal.  Nursing note and vitals reviewed.       Assessment & Plan:   Adjustment disorder with mixed anxiety and depressed mood  Essential hypertension - Plan: Comprehensive metabolic panel  Obesity - Plan: Lipid panel  Anemia, unspecified anemia type - Plan: CBC with Differential/Platelet  Anemia, iron deficiency No Follow-up on file.

## 2016-01-27 NOTE — Assessment & Plan Note (Signed)
Improved.  Encouraged her to keep up the great work.

## 2016-01-27 NOTE — Assessment & Plan Note (Signed)
Continue iron. Check labs today. The patient indicates understanding of these issues and agrees with the plan.  Orders Placed This Encounter  Procedures  . Flu Vaccine QUAD 36+ mos PF IM (Fluarix & Fluzone Quad PF)  . CBC with Differential/Platelet  . Comprehensive metabolic panel  . Lipid panel

## 2016-01-27 NOTE — Progress Notes (Signed)
Pre visit review using our clinic review tool, if applicable. No additional management support is needed unless otherwise documented below in the visit note. 

## 2016-01-27 NOTE — Assessment & Plan Note (Signed)
Well controlled on current rx. No changes made. I did advise her to look out for symptoms of orthostatic hypotension now that she is losing weight. The patient indicates understanding of these issues and agrees with the plan.

## 2016-01-28 ENCOUNTER — Encounter: Payer: Self-pay | Admitting: *Deleted

## 2016-06-11 ENCOUNTER — Encounter: Payer: Self-pay | Admitting: Family Medicine

## 2016-06-11 ENCOUNTER — Ambulatory Visit (INDEPENDENT_AMBULATORY_CARE_PROVIDER_SITE_OTHER): Payer: BLUE CROSS/BLUE SHIELD | Admitting: Family Medicine

## 2016-06-11 VITALS — BP 144/92 | HR 66 | Temp 98.0°F | Wt 136.5 lb

## 2016-06-11 DIAGNOSIS — I10 Essential (primary) hypertension: Secondary | ICD-10-CM

## 2016-06-11 MED ORDER — LISINOPRIL-HYDROCHLOROTHIAZIDE 10-12.5 MG PO TABS: 1.0000 | ORAL_TABLET | Freq: Every day | ORAL | Status: DC

## 2016-06-11 MED ORDER — ALBUTEROL SULFATE HFA 108 (90 BASE) MCG/ACT IN AERS: INHALATION_SPRAY | RESPIRATORY_TRACT | Status: DC

## 2016-06-11 NOTE — Progress Notes (Signed)
Subjective:   Patient ID: Michelle Barnett, female    DOB: February 20, 1961, 55 y.o.   MRN: [id number]  Michelle Barnett is a pleasant 55 y.o. year old female who presents to clinic today with Follow-up  on 06/11/2016  HPI:  HTN- has been well controlled on Prinzide 10-12.5 mg daily. Denies any HA, blurred vision, CP or SOB. No LE edema.  Has lost a lot of weight with low carb diet, exercise.  Feels great!  Slightly elevated today but has been normotensive at home- 118-120/60s.  Wt Readings from Last 3 Encounters:  06/11/16 136 lb 8 oz (61.916 kg)  01/27/16 144 lb 12 oz (65.658 kg)  08/17/14 178 lb 4 oz (80.854 kg)     Lab Results  Component Value Date   CREATININE 0.73 01/27/2016   Current Outpatient Prescriptions on File Prior to Visit  Medication Sig Dispense Refill  . ferrous sulfate 325 (65 FE) MG tablet Take 325 mg by mouth daily with breakfast.    . folic acid (FOLVITE) 800 MCG tablet Take 400 mcg by mouth daily.    . magnesium oxide (MAG-OX) 400 MG tablet Take 400 mg by mouth daily.    . Omega-3 Fatty Acids (FISH OIL) 1200 MG CAPS Take by mouth.    Marland Kitchen. omeprazole (PRILOSEC) 20 MG capsule Take 20 mg by mouth daily.    . vitamin B-12 (CYANOCOBALAMIN) 1000 MCG tablet Take 1,000 mcg by mouth daily.    . vitamin E 400 UNIT capsule Take 400 Units by mouth daily.     No current facility-administered medications on file prior to visit.    Allergies  Allergen Reactions  . Dairy Aid [Lactase] Swelling    Any dairy products  . Penicillins   . Benadryl [Diphenhydramine] Palpitations    Past Medical History  Diagnosis Date  . Bronchitis   . Ulcer   . Heart murmur   . Hypertension   . History of blood transfusion   . Hiatal hernia     Past Surgical History  Procedure Laterality Date  . Ablation    . Tooth extraction    . Abdominal tumor      Family History  Problem Relation Age of Onset  . Stroke Mother   . Hypertension Mother   . Hypertension Father   . Heart  disease Father   . Hypertension Brother     Social History   Social History  . Marital Status: Single    Spouse Name: N/A  . Number of Children: N/A  . Years of Education: N/A   Occupational History  . Not on file.   Social History Main Topics  . Smoking status: Never Smoker   . Smokeless tobacco: Never Used  . Alcohol Use: No  . Drug Use: No  . Sexual Activity: No   Other Topics Concern  . Not on file   Social History Narrative   The PMH, PSH, Social History, Family History, Medications, and allergies [facility], and have been updated if relevant.   Review of Systems  Constitutional: Negative.   HENT: Negative.   Eyes: Negative.   Respiratory: Negative.   Neurological: Negative.   Hematological: Negative.   Psychiatric/Behavioral: Negative.   All other systems reviewed and are negative.      Objective:    BP 144/92 mmHg  Pulse 66  Temp(Src) 98 F (36.7 C) (Oral)  Wt 136 lb 8 oz (61.916 kg)  SpO2 98%   Physical Exam  Constitutional: She appears well-developed and well-nourished. No distress.  HENT:  Head: Normocephalic.  Eyes: Conjunctivae are normal.  Cardiovascular: Normal rate and regular rhythm.   Pulmonary/Chest: Effort normal and breath sounds normal.  Musculoskeletal: Normal range of motion.  Skin: Skin is warm and dry. She is not diaphoretic.  Psychiatric: She has a normal mood and affect. Her behavior is normal. Judgment and thought content normal.  Nursing note and vitals reviewed.         Assessment & Plan:   Essential hypertension No Follow-up on file.

## 2016-06-11 NOTE — Progress Notes (Signed)
Pre visit review using our clinic review tool, if applicable. No additional management support is needed unless otherwise documented below in the visit note. 

## 2016-06-11 NOTE — Patient Instructions (Signed)
Great to see you!   

## 2016-06-11 NOTE — Assessment & Plan Note (Signed)
Well controlled. No changes made. eRx refilled today.

## 2016-06-15 ENCOUNTER — Other Ambulatory Visit: Payer: Self-pay | Admitting: Family Medicine

## 2016-06-15 DIAGNOSIS — E538 Deficiency of other specified B group vitamins: Secondary | ICD-10-CM

## 2016-06-15 DIAGNOSIS — I10 Essential (primary) hypertension: Secondary | ICD-10-CM

## 2016-06-15 DIAGNOSIS — D509 Iron deficiency anemia, unspecified: Secondary | ICD-10-CM

## 2016-06-15 DIAGNOSIS — Z0000 Encounter for general adult medical examination without abnormal findings: Secondary | ICD-10-CM | POA: Insufficient documentation

## 2016-06-15 DIAGNOSIS — Z01419 Encounter for gynecological examination (general) (routine) without abnormal findings: Secondary | ICD-10-CM

## 2016-06-16 ENCOUNTER — Other Ambulatory Visit (INDEPENDENT_AMBULATORY_CARE_PROVIDER_SITE_OTHER): Payer: BLUE CROSS/BLUE SHIELD

## 2016-06-16 DIAGNOSIS — Z0000 Encounter for general adult medical examination without abnormal findings: Secondary | ICD-10-CM

## 2016-06-16 DIAGNOSIS — E538 Deficiency of other specified B group vitamins: Secondary | ICD-10-CM | POA: Diagnosis not present

## 2016-06-16 DIAGNOSIS — Z01419 Encounter for gynecological examination (general) (routine) without abnormal findings: Secondary | ICD-10-CM

## 2016-06-16 LAB — COMPREHENSIVE METABOLIC PANEL
ALT: 13 U/L (ref 0–35)
AST: 16 U/L (ref 0–37)
Albumin: 4 g/dL (ref 3.5–5.2)
Alkaline Phosphatase: 48 U/L (ref 39–117)
BUN: 16 mg/dL (ref 6–23)
CALCIUM: 9.6 mg/dL (ref 8.4–10.5)
CHLORIDE: 103 meq/L (ref 96–112)
CO2: 33 meq/L — AB (ref 19–32)
Creatinine, Ser: 0.65 mg/dL (ref 0.40–1.20)
GFR: 100.13 mL/min (ref >60.00)
GLUCOSE: 90 mg/dL (ref 70–99)
Potassium: 3.8 mEq/L (ref 3.5–5.1)
Sodium: 140 mEq/L (ref 135–145)
Total Bilirubin: 0.2 mg/dL (ref 0.2–1.2)
Total Protein: 6.7 g/dL (ref 6.0–8.3)

## 2016-06-16 LAB — VITAMIN B12: Vitamin B-12: 891 pg/mL (ref 211–911)

## 2016-06-16 LAB — CBC WITH DIFFERENTIAL/PLATELET
BASOS PCT: 1.2 % (ref 0.0–3.0)
Basophils Absolute: 0 10*3/uL (ref 0.0–0.1)
EOS ABS: 0.1 10*3/uL (ref 0.0–0.7)
Eosinophils Relative: 2.5 % (ref 0.0–5.0)
HEMATOCRIT: 35.1 % — AB (ref 36.0–46.0)
Hemoglobin: 10.9 g/dL — ABNORMAL LOW (ref 12.0–15.0)
LYMPHS PCT: 24.5 % (ref 12.0–46.0)
Lymphs Abs: 0.9 10*3/uL (ref 0.7–4.0)
MCHC: 31 g/dL (ref 30.0–36.0)
MCV: 82.3 fl (ref 78.0–100.0)
MONOS PCT: 11.2 % (ref 3.0–12.0)
Monocytes Absolute: 0.4 10*3/uL (ref 0.1–1.0)
NEUTROS ABS: 2.2 10*3/uL (ref 1.4–7.7)
NEUTROS PCT: 60.6 % (ref 43.0–77.0)
Platelets: 211 10*3/uL (ref 150.0–400.0)
RBC: 4.27 Mil/uL (ref 3.87–5.11)
RDW: 18.2 % — ABNORMAL HIGH (ref 11.5–15.5)
WBC: 3.6 10*3/uL — ABNORMAL LOW (ref 4.0–10.5)

## 2016-06-16 LAB — LIPID PANEL
CHOL/HDL RATIO: 3
Cholesterol: 194 mg/dL (ref 0–200)
HDL: 66.7 mg/dL (ref >39.00)
LDL CALC: 120 mg/dL — AB (ref 0–99)
NONHDL: 127.37
TRIGLYCERIDES: 38 mg/dL (ref 0.0–149.0)
VLDL: 7.6 mg/dL (ref 0.0–40.0)

## 2016-06-16 LAB — TSH: TSH: 1.67 u[IU]/mL (ref 0.35–4.50)

## 2016-06-19 ENCOUNTER — Telehealth: Payer: Self-pay | Admitting: Family Medicine

## 2016-06-19 NOTE — Telephone Encounter (Signed)
Pt called to get her lab results.  She has ? About hemiglobin Pt would like a call back ASAP

## 2016-06-19 NOTE — Telephone Encounter (Signed)
Spoke to pt and discussed labs with her,again

## 2016-06-19 NOTE — Telephone Encounter (Signed)
Patient said she spoke to [place] about her lab work.  Patient said she was driving.  Please call patient back.

## 2016-06-22 ENCOUNTER — Telehealth: Payer: Self-pay | Admitting: Family Medicine

## 2016-06-22 NOTE — Telephone Encounter (Signed)
I would like for her to take iron 325 mg twice daily and have labs repeated in 8 weeks.

## 2016-06-22 NOTE — Telephone Encounter (Signed)
Patient Name: Michelle RamsayCATHY Dinius DOB: [birth year] Initial Comment Caller states, hemoglobin 13.2 in January , then last week it was 10.9 , She wants to know what to do ? Nurse Assessment Guidelines Guideline Title Affirmed Question Affirmed Notes Final Disposition User FINAL ATTEMPT MADE - no message left Yetta BarreJones, RN, Miranda Comments Message that voicemail box has not been set up yet. Unable to leave a message. Same message, voicemail has not been set up yet.

## 2016-06-22 NOTE — Telephone Encounter (Signed)
Spoke to pt and advised per Dr Aron. F/u labs scheduled.  

## 2016-06-22 NOTE — Telephone Encounter (Signed)
I spoke with pt; Pt is presently taking 325 units of iron daily ; pt states she feels fine, has not seen any bleeding. Pt wants to know if needs to increase iron or what to do.

## 2016-06-25 ENCOUNTER — Other Ambulatory Visit: Payer: Self-pay | Admitting: Family Medicine

## 2016-06-25 ENCOUNTER — Other Ambulatory Visit (HOSPITAL_COMMUNITY)
Admission: RE | Admit: 2016-06-25 | Discharge: 2016-06-25 | Disposition: A | Discharge status: Home / Self Care | Payer: BLUE CROSS/BLUE SHIELD | Source: Ambulatory Visit | Attending: Family Medicine | Admitting: Family Medicine

## 2016-06-25 ENCOUNTER — Encounter: Payer: Self-pay | Admitting: Family Medicine

## 2016-06-25 ENCOUNTER — Ambulatory Visit (INDEPENDENT_AMBULATORY_CARE_PROVIDER_SITE_OTHER): Payer: BLUE CROSS/BLUE SHIELD | Admitting: Family Medicine

## 2016-06-25 VITALS — BP 146/82 | HR 85 | Temp 98.1°F | Ht <= 58 in | Wt 137.5 lb

## 2016-06-25 DIAGNOSIS — E669 Obesity, unspecified: Secondary | ICD-10-CM | POA: Diagnosis not present

## 2016-06-25 DIAGNOSIS — Z01419 Encounter for gynecological examination (general) (routine) without abnormal findings: Secondary | ICD-10-CM | POA: Diagnosis present

## 2016-06-25 DIAGNOSIS — Z1231 Encounter for screening mammogram for malignant neoplasm of breast: Secondary | ICD-10-CM

## 2016-06-25 DIAGNOSIS — F4323 Adjustment disorder with mixed anxiety and depressed mood: Secondary | ICD-10-CM

## 2016-06-25 DIAGNOSIS — D509 Iron deficiency anemia, unspecified: Secondary | ICD-10-CM

## 2016-06-25 DIAGNOSIS — Z113 Encounter for screening for infections with a predominantly sexual mode of transmission: Secondary | ICD-10-CM | POA: Diagnosis present

## 2016-06-25 DIAGNOSIS — E538 Deficiency of other specified B group vitamins: Secondary | ICD-10-CM | POA: Diagnosis not present

## 2016-06-25 DIAGNOSIS — Z0000 Encounter for general adult medical examination without abnormal findings: Secondary | ICD-10-CM

## 2016-06-25 DIAGNOSIS — Z1151 Encounter for screening for human papillomavirus (HPV): Secondary | ICD-10-CM | POA: Insufficient documentation

## 2016-06-25 DIAGNOSIS — I10 Essential (primary) hypertension: Secondary | ICD-10-CM

## 2016-06-25 DIAGNOSIS — J449 Chronic obstructive pulmonary disease, unspecified: Secondary | ICD-10-CM

## 2016-06-25 NOTE — Assessment & Plan Note (Signed)
Reasonable control. No changes made. 

## 2016-06-25 NOTE — Progress Notes (Signed)
Pre visit review using our clinic review tool, if applicable. No additional management support is needed unless otherwise documented below in the visit note. 

## 2016-06-25 NOTE — Addendum Note (Signed)
Addended by: Desmond DikeKNIGHT, Calen Posch H on: 06/25/2016 01:39 PM   Modules accepted: Orders, SmartSet

## 2016-06-25 NOTE — Assessment & Plan Note (Signed)
Reviewed preventive care protocols, scheduled due services, and updated immunizations Discussed nutrition, exercise, diet, and healthy lifestyle.  

## 2016-06-25 NOTE — Assessment & Plan Note (Signed)
Deteriorated. Iron increased to twice daily.

## 2016-06-25 NOTE — Progress Notes (Signed)
Subjective:   Patient ID: Michelle Barnett, female    DOB: 1961-06-13, 55 y.o.   MRN: [id number]  Michelle GuppyCathy F Hedgecock is a pleasant 55 y.o. year old female who presents to clinic today with Annual Exam  on 06/25/2016  HPI:  Mammogram 07/31/14 ? Colonoscopy- per pt 2012- Dr. Mechele CollinElliott- h/o GI bleed at that time.   Anemia-  Deteriorated.  No recent bleeding. was followed by Dr. Orlie DakinFinnegan. H/o previous transfusions.  Now taking iron once daily, advised to increase last week as her H/H is low.  Remote h/o ablation.    Has not had a pap smear in over 5 years.  BP has been well controlled.  Continues to lose weight with diet.  Wt Readings from Last 3 Encounters:  06/25/16 137 lb 8 oz (62.37 kg)  06/11/16 136 lb 8 oz (61.916 kg)  01/27/16 144 lb 12 oz (65.658 kg)      Lab Results  Component Value Date   WBC 3.6* 06/16/2016   HGB 10.9* 06/16/2016   HCT 35.1* 06/16/2016   MCV 82.3 06/16/2016   PLT 211.0 06/16/2016   Lab Results  Component Value Date   CHOL 194 06/16/2016   HDL 66.70 06/16/2016   LDLCALC 120* 06/16/2016   TRIG 38.0 06/16/2016   CHOLHDL 3 06/16/2016   Lab Results  Component Value Date   TSH 1.67 06/16/2016   Lab Results  Component Value Date   ALT 13 06/16/2016   AST 16 06/16/2016   ALKPHOS 48 06/16/2016   BILITOT 0.2 06/16/2016   Lab Results  Component Value Date   CREATININE 0.65 06/16/2016      Current Outpatient Prescriptions on File Prior to Visit  Medication Sig Dispense Refill  . albuterol (VENTOLIN HFA) 108 (90 Base) MCG/ACT inhaler INHALE 2 PUFFS INTO THE LUNGS EVERY 6 (SIX) HOURS AS NEEDED FOR WHEEZING OR SHORTNESS OF BREATH. 18 Inhaler 1  . ferrous sulfate 325 (65 FE) MG tablet Take 325 mg by mouth daily with breakfast.    . folic acid (FOLVITE) 800 MCG tablet Take 400 mcg by mouth daily.    Marland Kitchen. lisinopril-hydrochlorothiazide (PRINZIDE,ZESTORETIC) 10-12.5 MG tablet Take 1 tablet by mouth daily. 90 tablet 3  . magnesium oxide (MAG-OX)  400 MG tablet Take 400 mg by mouth daily.    . Omega-3 Fatty Acids (FISH OIL) 1200 MG CAPS Take by mouth.    Marland Kitchen. omeprazole (PRILOSEC) 20 MG capsule Take 20 mg by mouth daily.    . vitamin B-12 (CYANOCOBALAMIN) 1000 MCG tablet Take 1,000 mcg by mouth daily.    . vitamin E 400 UNIT capsule Take 400 Units by mouth daily.     No current facility-administered medications on file prior to visit.    Allergies  Allergen Reactions  . Dairy Aid [Lactase] Swelling    Any dairy products  . Penicillins   . Benadryl [Diphenhydramine] Palpitations    Past Medical History  Diagnosis Date  . Bronchitis   . Ulcer   . Heart murmur   . Hypertension   . History of blood transfusion   . Hiatal hernia     Past Surgical History  Procedure Laterality Date  . Ablation    . Tooth extraction    . Abdominal tumor      Family History  Problem Relation Age of Onset  . Stroke Mother   . Hypertension Mother   . Hypertension Father   . Heart disease Father   . Hypertension Brother  Social History   Social History  . Marital Status: Single    Spouse Name: N/A  . Number of Children: N/A  . Years of Education: N/A   Occupational History  . Not on file.   Social History Main Topics  . Smoking status: Never Smoker   . Smokeless tobacco: Never Used  . Alcohol Use: No  . Drug Use: No  . Sexual Activity: No   Other Topics Concern  . Not on file   Social History Narrative   The PMH, PSH, Social History, Family History, Medications, and allergies have been reviewed in [facility], and have been updated if relevant.   Review of Systems  Constitutional: Negative.   HENT: Negative.   Eyes: Negative.   Respiratory: Negative.   Cardiovascular: Negative.   Gastrointestinal: Negative.   Endocrine: Negative.   Musculoskeletal: Negative.   Skin: Negative.   Allergic/Immunologic: Negative.   Neurological: Negative.   Hematological: Negative.   Psychiatric/Behavioral: Negative.   All other  systems reviewed and are negative.      Objective:    BP 146/82 mmHg  Pulse 85  Temp(Src) 98.1 F (36.7 C) (Oral)  Ht 4' 8.75" (1.441 m)  Wt 137 lb 8 oz (62.37 kg)  BMI 30.04 kg/m2  SpO2 96%   Physical Exam   General:  Well-developed,well-nourished,in no acute distress; alert,appropriate and cooperative throughout examination Head:  normocephalic and atraumatic.   Eyes:  vision grossly intact, pupils equal, pupils round, and pupils reactive to light.   Ears:  R ear normal and L ear normal.   Nose:  no external deformity.   Mouth:  good dentition.   Neck:  No deformities, masses, or tenderness noted. Breasts:  No mass, nodules, thickening, tenderness, bulging, retraction, inflamation, nipple discharge or skin changes noted.   Lungs:  Normal respiratory effort, chest expands symmetrically. Lungs are clear to auscultation, no crackles or wheezes. Heart:  Normal rate and regular rhythm. S1 and S2 normal without gallop, murmur, click, rub or other extra sounds. Abdomen:  Bowel sounds positive,abdomen soft and non-tender without masses, organomegaly or hernias noted. Rectal:  no external abnormalities.   Genitalia:  Pelvic Exam:        External: normal female genitalia without lesions or masses        Vagina: normal without lesions or masses        Cervix: normal without lesions or masses        Adnexa: normal bimanual exam without masses or fullness        Uterus: normal by palpation        Pap smear: performed Msk:  No deformity or scoliosis noted of thoracic or lumbar spine.   Extremities:  No clubbing, cyanosis, edema, or deformity noted with normal full range of motion of all joints.   Neurologic:  alert & oriented X3 and gait normal.   Skin:  Intact without suspicious lesions or rashes Cervical Nodes:  No lymphadenopathy noted Axillary Nodes:  No palpable lymphadenopathy Psych:  Cognition and judgment appear intact. Alert and cooperative with normal attention span and  concentration. No apparent delusions, illusions, hallucinations       Assessment & Plan:   Well woman exam  Obesity  Adjustment disorder with mixed anxiety and depressed mood  Vitamin B12 deficiency  COPD, severity to be determined [facility](HCC)  Essential hypertension  Anemia, iron deficiency No Follow-up on file.

## 2016-06-25 NOTE — Patient Instructions (Signed)
Good to see you. Please call to schedule your mammogram.

## 2016-06-26 LAB — CYTOLOGY - PAP

## 2016-06-29 ENCOUNTER — Encounter: Payer: Self-pay | Admitting: *Deleted

## 2016-06-29 LAB — CERVICOVAGINAL ANCILLARY ONLY
Candida vaginitis: NEGATIVE
HERPES (WINDOWPATH): NEGATIVE

## 2016-07-08 ENCOUNTER — Other Ambulatory Visit: Payer: Self-pay | Admitting: Family Medicine

## 2016-07-08 ENCOUNTER — Ambulatory Visit
Admission: RE | Admit: 2016-07-08 | Discharge: 2016-07-08 | Disposition: A | Discharge status: Home / Self Care | Payer: BLUE CROSS/BLUE SHIELD | Source: Ambulatory Visit | Attending: Family Medicine | Admitting: Family Medicine

## 2016-07-08 DIAGNOSIS — Z1231 Encounter for screening mammogram for malignant neoplasm of breast: Secondary | ICD-10-CM | POA: Diagnosis not present

## 2016-07-13 ENCOUNTER — Other Ambulatory Visit: Payer: Self-pay | Admitting: Family Medicine

## 2016-07-13 DIAGNOSIS — R928 Other abnormal and inconclusive findings on diagnostic imaging of breast: Secondary | ICD-10-CM

## 2016-07-23 ENCOUNTER — Telehealth: Payer: Self-pay

## 2016-07-23 ENCOUNTER — Other Ambulatory Visit: Payer: Self-pay | Admitting: Family Medicine

## 2016-07-23 ENCOUNTER — Ambulatory Visit
Admission: RE | Admit: 2016-07-23 | Discharge: 2016-07-23 | Disposition: A | Discharge status: Home / Self Care | Payer: BLUE CROSS/BLUE SHIELD | Source: Ambulatory Visit | Attending: Family Medicine | Admitting: Family Medicine

## 2016-07-23 DIAGNOSIS — R928 Other abnormal and inconclusive findings on diagnostic imaging of breast: Secondary | ICD-10-CM | POA: Insufficient documentation

## 2016-07-23 NOTE — Telephone Encounter (Signed)
I would proceed with the needle biopsy since that is what the radiologist recommends.  It is not an invasive procedure and should be quite safe.  I would recommend that she ask whomever is performing the biopsy to go over risks of procedure.

## 2016-07-23 NOTE — Telephone Encounter (Signed)
Spoke to pt and advised per Dr Aron.  

## 2016-07-23 NOTE — Telephone Encounter (Signed)
Pt left v/m; pt just left [facility] and pt wants to know if needle biopsy is done when the needle is inserted and if there is cancer there will the needle spread the cancer.  Pt request cb. Pt was advised today she needs a needle biopsy at [facility] [facility]. Pt wants to know if needs 2nd opinion.

## 2016-07-27 ENCOUNTER — Other Ambulatory Visit: Payer: Self-pay | Admitting: Family Medicine

## 2016-07-27 ENCOUNTER — Other Ambulatory Visit: Payer: Self-pay

## 2016-07-27 DIAGNOSIS — R928 Other abnormal and inconclusive findings on diagnostic imaging of breast: Secondary | ICD-10-CM

## 2016-07-28 ENCOUNTER — Ambulatory Visit
Admission: RE | Admit: 2016-07-28 | Discharge: 2016-07-28 | Disposition: A | Discharge status: Home / Self Care | Payer: BLUE CROSS/BLUE SHIELD | Source: Ambulatory Visit | Attending: Family Medicine | Admitting: Family Medicine

## 2016-07-28 ENCOUNTER — Other Ambulatory Visit: Payer: Self-pay | Admitting: Family Medicine

## 2016-07-28 DIAGNOSIS — R928 Other abnormal and inconclusive findings on diagnostic imaging of breast: Secondary | ICD-10-CM

## 2016-08-11 ENCOUNTER — Other Ambulatory Visit (INDEPENDENT_AMBULATORY_CARE_PROVIDER_SITE_OTHER): Payer: BLUE CROSS/BLUE SHIELD

## 2016-08-11 ENCOUNTER — Telehealth: Payer: Self-pay | Admitting: *Deleted

## 2016-08-11 ENCOUNTER — Emergency Department
Admission: EM | Admit: 2016-08-11 | Discharge: 2016-08-12 | Disposition: A | Discharge status: Other Acute Inpatient Hospital | Payer: BLUE CROSS/BLUE SHIELD | Attending: Emergency Medicine | Admitting: Emergency Medicine

## 2016-08-11 ENCOUNTER — Other Ambulatory Visit: Payer: Self-pay | Admitting: Family Medicine

## 2016-08-11 ENCOUNTER — Encounter: Payer: Self-pay | Admitting: Emergency Medicine

## 2016-08-11 ENCOUNTER — Other Ambulatory Visit: Payer: BLUE CROSS/BLUE SHIELD

## 2016-08-11 DIAGNOSIS — D509 Iron deficiency anemia, unspecified: Secondary | ICD-10-CM

## 2016-08-11 DIAGNOSIS — K922 Gastrointestinal hemorrhage, unspecified: Secondary | ICD-10-CM | POA: Diagnosis not present

## 2016-08-11 DIAGNOSIS — R531 Weakness: Secondary | ICD-10-CM | POA: Diagnosis present

## 2016-08-11 DIAGNOSIS — I10 Essential (primary) hypertension: Secondary | ICD-10-CM | POA: Diagnosis not present

## 2016-08-11 DIAGNOSIS — J449 Chronic obstructive pulmonary disease, unspecified: Secondary | ICD-10-CM | POA: Insufficient documentation

## 2016-08-11 LAB — COMPREHENSIVE METABOLIC PANEL
ALK PHOS: 40 U/L (ref 38–126)
ALT: 14 U/L (ref 14–54)
AST: 20 U/L (ref 15–41)
Albumin: 3.5 g/dL (ref 3.5–5.0)
Anion gap: 6 (ref 5–15)
BUN: 19 mg/dL (ref 6–20)
CALCIUM: 9 mg/dL (ref 8.9–10.3)
CHLORIDE: 104 mmol/L (ref 101–111)
CO2: 28 mmol/L (ref 22–32)
CREATININE: 0.5 mg/dL (ref 0.44–1.00)
GFR calc Af Amer: >60 mL/min (ref >60)
Glucose, Bld: 101 mg/dL — ABNORMAL HIGH (ref 65–99)
Potassium: 4 mmol/L (ref 3.5–5.1)
Sodium: 138 mmol/L (ref 135–145)
Total Bilirubin: 0.4 mg/dL (ref 0.3–1.2)
Total Protein: 6 g/dL — ABNORMAL LOW (ref 6.5–8.1)

## 2016-08-11 LAB — CBC WITH DIFFERENTIAL/PLATELET
BASOS PCT: 0.5 % (ref 0.0–3.0)
Basophils Absolute: 0 10*3/uL (ref 0.0–0.1)
EOS PCT: 1.1 % (ref 0.0–5.0)
Eosinophils Absolute: 0.1 10*3/uL (ref 0.0–0.7)
LYMPHS PCT: 17.2 % (ref 12.0–46.0)
Lymphs Abs: 0.9 10*3/uL (ref 0.7–4.0)
MCHC: 32.2 g/dL (ref 30.0–36.0)
MCV: 90.4 fl (ref 78.0–100.0)
MONOS PCT: 8.1 % (ref 3.0–12.0)
Monocytes Absolute: 0.4 10*3/uL (ref 0.1–1.0)
Neutro Abs: 3.7 10*3/uL (ref 1.4–7.7)
Neutrophils Relative %: 73.1 % (ref 43.0–77.0)
Platelets: 254 10*3/uL (ref 150.0–400.0)
RDW: 18.5 % — AB (ref 11.5–15.5)
WBC: 5.1 10*3/uL (ref 4.0–10.5)

## 2016-08-11 LAB — CBC
HEMATOCRIT: 24.4 % — AB (ref 35.0–47.0)
Hemoglobin: 7.8 g/dL — ABNORMAL LOW (ref 12.0–16.0)
MCH: 29.1 pg (ref 26.0–34.0)
MCHC: 31.9 g/dL — AB (ref 32.0–36.0)
MCV: 91.2 fL (ref 80.0–100.0)
PLATELETS: 252 10*3/uL (ref 150–440)
RBC: 2.68 MIL/uL — ABNORMAL LOW (ref 3.80–5.20)
RDW: 18.4 % — AB (ref 11.5–14.5)
WBC: 5.9 10*3/uL (ref 3.6–11.0)

## 2016-08-11 LAB — ABO/RH: ABO/RH(D): O POS

## 2016-08-11 LAB — FERRITIN: FERRITIN: 8.5 ng/mL — AB (ref 10.0–291.0)

## 2016-08-11 MED ORDER — SODIUM CHLORIDE 0.9 % IV SOLN
10.0000 mL/h | Freq: Once | INTRAVENOUS | Status: AC
  Administered 2016-08-11: 10 mL/h via INTRAVENOUS

## 2016-08-11 MED ORDER — FAMOTIDINE IN NACL 20-0.9 MG/50ML-% IV SOLN
20.0000 mg | Freq: Once | INTRAVENOUS | Status: AC
  Administered 2016-08-11: 20 mg via INTRAVENOUS
  Filled 2016-08-11: qty 50

## 2016-08-11 NOTE — Telephone Encounter (Signed)
Vern[facility]aura at [facility] called with critical labs: Hgb=7.4 and HCT=23.1. [facility] and will call Dr. Dayton MartesAron

## 2016-08-11 NOTE — Telephone Encounter (Signed)
That is a big drop.  Has she been bleeding?  I do think it prudent that she go to the ER as she needs work up and a possible blood transfusion.

## 2016-08-11 NOTE — ED Notes (Signed)
MD at bedside for rectal exam. Assisted by this RN. Patient tolerated well.

## 2016-08-11 NOTE — Telephone Encounter (Signed)
Spoke to pt and advised per Dr Dayton MartesAron. Pt states she will head to ED

## 2016-08-11 NOTE — ED Triage Notes (Signed)
Patient into the ED via POV c/o low hemoglobin at 7.4 per her PCP.  Dr. Dayton MartesAron sent the patient here for possible blood transfusion.  Patient has history of anemia and has history of blood transfusion.  Denies chest pain but does explain that she has had minor shortness of breath and extra fatigue.  Patient appears in NAD at this time with even and unlabored respirations.

## 2016-08-11 NOTE — ED Provider Notes (Signed)
Time Seen: Approximately 2109  I have reviewed the triage notes  Chief Complaint: Anemia   History of Present Illness: Michelle Barnett is a 55 y.o. female who presents with generalized weakness and some dark stool. Patient states she's had a previous history of an upper gastrointestinal bleed. She also has history of iron deficiency anemia has been on iron supplement tablets. The patient denies any abdominal pain though she has noticed dark tarry stool which she attributed to her iron tablets. She denies any syncopal episode. She did see her primary physician was called because her hemoglobin was noted to be 7.5 on outpatient testing.  She denies any chest pain or shortness of breath or focal weakness. She's felt lightheaded again without any syncope. Past Medical History:  Diagnosis Date  . Bronchitis   . Heart murmur   . Hiatal hernia   . History of blood transfusion   . Hypertension   . Ulcer     Patient Active Problem List   Diagnosis Date Noted  . Well woman exam 06/15/2016  . Obesity 01/27/2016  . Anemia, iron deficiency 07/30/2014  . HTN (hypertension) 07/30/2014  . COPD, severity to be determined (HCC) 07/30/2014  . Acute blood loss anemia 07/30/2014  . History of GI bleed 07/30/2014  . Vitamin B12 deficiency 07/30/2014  . Adjustment disorder with mixed anxiety and depressed mood 07/30/2014    Past Surgical History:  Procedure Laterality Date  . abdominal tumor    . ABLATION    . TOOTH EXTRACTION      Past Surgical History:  Procedure Laterality Date  . abdominal tumor    . ABLATION    . TOOTH EXTRACTION      Current Outpatient Rx  . Order #: [id number] Class: Normal  . Order #: [id number] Class: Historical Med  . Order #: [id number] Class: Historical Med  . Order #: [id number] Class: Normal  . Order #: [id number] Class: Historical Med  . Order #: [id number] Class: Historical Med  . Order #: [id number] Class: Historical Med  . Order #: [id number] Class: Historical  Med  . Order #: [id number] Class: Historical Med    Allergies:  Dairy aid [lactase]; Penicillins; and Benadryl [diphenhydramine]  Family History: Family History  Problem Relation Age of Onset  . Stroke Mother   . Hypertension Mother   . Hypertension Father   . Heart disease Father   . Hypertension Brother   . Breast cancer Maternal Aunt     Social History: Social History  Substance Use Topics  . Smoking status: Never Smoker  . Smokeless tobacco: Never Used  . Alcohol use No     Review of Systems:   10 point review of systems was performed and was otherwise negative:  Constitutional: No fever Eyes: No visual disturbances ENT: No sore throat, ear pain Cardiac: No chest pain Respiratory: No shortness of breath, wheezing, or stridor Abdomen: No abdominal pain, no vomiting, No diarrhea Endocrine: No weight loss, No night sweats Extremities: No peripheral edema, cyanosis Skin: No rashes, easy bruising Neurologic: No focal weakness, trouble with speech or swollowing Urologic: No dysuria, Hematuria, or urinary frequency   Physical Exam:  ED Triage Vitals  Enc Vitals Group     BP 08/11/16 1731 126/62     Pulse Rate 08/11/16 1731 95     Resp 08/11/16 1731 16     Temp 08/11/16 1731 98.6 F (37 C)     Temp Source 08/11/16 1731 Oral     SpO2 08/11/16 1731 98 %  Weight 08/11/16 1732 130 lb (59 kg)     Height 08/11/16 1732 4\' 8"  (1.422 m)     Head Circumference --      Peak Flow --      Pain Score --      Pain Loc --      Pain Edu? --      Excl. in GC? --     General: Awake , Alert , and Oriented times 3; GCS 15 Head: Normal cephalic , atraumatic Eyes: Pupils equal , round, reactive to light Nose/Throat: No nasal drainage, patent upper airway without erythema or exudate.  Neck: Supple, Full range of motion, No anterior adenopathy or palpable thyroid masses Lungs: Clear to ascultation without wheezes , rhonchi, or rales Heart: Regular rate, regular rhythm  without murmurs , gallops , or rubs Abdomen: Soft, non tender without rebound, guarding , or rigidity; bowel sounds positive and symmetric in all 4 quadrants. No organomegaly .        Extremities: 2 plus symmetric pulses. No edema, clubbing or cyanosis Neurologic: normal ambulation, Motor symmetric without deficits, sensory intact Skin: warm, dry, no rashes Rectal exam with chaperone present was showing obvious melanotic stool, guaiac positive, no palpable masses within the rectal vault. Normal sphincter tone  Labs:   All laboratory work was reviewed including any pertinent negatives or positives listed below:  Labs Reviewed  COMPREHENSIVE METABOLIC PANEL - Abnormal; Notable for the following:       Result Value   Glucose, Bld 101 (*)    Total Protein 6.0 (*)    All other components within normal limits  CBC - Abnormal; Notable for the following:    RBC 2.68 (*)    Hemoglobin 7.8 (*)    HCT 24.4 (*)    MCHC 31.9 (*)    RDW 18.4 (*)    All other components within normal limits  POC OCCULT BLOOD, ED  TYPE AND SCREEN  PREPARE RBC (CROSSMATCH)    EKG: * ED ECG REPORT I, Jennye Moccasin, the attending physician, personally viewed and interpreted this ECG.  Date: 08/11/2016 EKG Time: 1745 Rate: *86 Rhythm: normal sinus rhythm QRS Axis: normal Intervals: normal ST/T Wave abnormalities: Normal Conduction Disturbances: none Narrative Interpretation: unremarkable Normal EKG   Critical Care: CRITICAL CARE Performed by: Jennye Moccasin   Total critical care time: 41 minutes  Critical care time was exclusive of separately billable procedures and treating other patients.  Critical care was necessary to treat or prevent imminent or life-threatening deterioration.  Critical care was time spent personally by me on the following activities: development of treatment plan with patient and/or surrogate as well as nursing, discussions with consultants, evaluation of patient's response  to treatment, examination of patient, obtaining history from patient or surrogate, ordering and performing treatments and interventions, ordering and review of laboratory studies, ordering and review of radiographic studies, pulse oximetry and re-evaluation of patient's condition. Initial blood transfusion and transfer patient with an active gastrointestinal bleed    ED Course:   Patient was initiated on blood transfusion along with IV Pepcid for what is likely to be an upper gastrointestinal bleed specifically gastric ulcer disease, etc. The patient's case was reviewed with our hospitalist team and due to having no gastroenterologist back up, they preferred the patient be transferred. Discussion with the patient the bedside patient selected [facility]. I spoke in the [facility] gastro[facility]ologist along with the hospitalist and the patient's been accepted for transfer currently waiting for  a bed to be established. We will continue here with a blood transfusion and the IV Pepcid. The patient's currently hemodynamically stable   Clinical Course     Assessment:  Acute upper gastrointestinal bleed Anemia Blood transfusion      Plan:  Transfer           Jennye MoccasinBrian S Copeland Neisen, MD 08/11/16 2211

## 2016-08-12 ENCOUNTER — Encounter (HOSPITAL_COMMUNITY): Payer: Self-pay | Admitting: Family Medicine

## 2016-08-12 ENCOUNTER — Encounter (HOSPITAL_COMMUNITY)
Admission: EM | Disposition: A | Discharge status: Home / Self Care | Payer: Self-pay | Source: Other Acute Inpatient Hospital | Attending: Internal Medicine

## 2016-08-12 ENCOUNTER — Inpatient Hospital Stay (HOSPITAL_COMMUNITY)
Admission: EM | Admit: 2016-08-12 | Discharge: 2016-08-14 | DRG: 378 | Disposition: A | Discharge status: Home / Self Care | Payer: BLUE CROSS/BLUE SHIELD | Source: Other Acute Inpatient Hospital | Attending: Internal Medicine | Admitting: Internal Medicine

## 2016-08-12 DIAGNOSIS — D509 Iron deficiency anemia, unspecified: Secondary | ICD-10-CM | POA: Diagnosis present

## 2016-08-12 DIAGNOSIS — K254 Chronic or unspecified gastric ulcer with hemorrhage: Secondary | ICD-10-CM

## 2016-08-12 DIAGNOSIS — K449 Diaphragmatic hernia without obstruction or gangrene: Secondary | ICD-10-CM | POA: Diagnosis present

## 2016-08-12 DIAGNOSIS — Z888 Allergy status to other drugs, medicaments and biological substances status: Secondary | ICD-10-CM | POA: Diagnosis not present

## 2016-08-12 DIAGNOSIS — D62 Acute posthemorrhagic anemia: Secondary | ICD-10-CM | POA: Diagnosis present

## 2016-08-12 DIAGNOSIS — K922 Gastrointestinal hemorrhage, unspecified: Secondary | ICD-10-CM | POA: Diagnosis not present

## 2016-08-12 DIAGNOSIS — K6289 Other specified diseases of anus and rectum: Secondary | ICD-10-CM | POA: Diagnosis not present

## 2016-08-12 DIAGNOSIS — I10 Essential (primary) hypertension: Secondary | ICD-10-CM | POA: Diagnosis present

## 2016-08-12 DIAGNOSIS — Z91011 Allergy to milk products: Secondary | ICD-10-CM

## 2016-08-12 DIAGNOSIS — K648 Other hemorrhoids: Secondary | ICD-10-CM | POA: Diagnosis present

## 2016-08-12 DIAGNOSIS — K921 Melena: Principal | ICD-10-CM | POA: Diagnosis present

## 2016-08-12 DIAGNOSIS — Z79899 Other long term (current) drug therapy: Secondary | ICD-10-CM

## 2016-08-12 DIAGNOSIS — Z880 Allergy status to penicillin: Secondary | ICD-10-CM

## 2016-08-12 DIAGNOSIS — R0602 Shortness of breath: Secondary | ICD-10-CM | POA: Diagnosis present

## 2016-08-12 LAB — CBC
HEMATOCRIT: 30.8 % — AB (ref 36.0–46.0)
HEMOGLOBIN: 9.4 g/dL — AB (ref 12.0–15.0)
MCH: 29 pg (ref 26.0–34.0)
MCHC: 30.5 g/dL (ref 30.0–36.0)
MCV: 95.1 fL (ref 78.0–100.0)
Platelets: 239 10*3/uL (ref 150–400)
RBC: 3.24 MIL/uL — ABNORMAL LOW (ref 3.87–5.11)
RDW: 17.6 % — ABNORMAL HIGH (ref 11.5–15.5)
WBC: 6.6 10*3/uL (ref 4.0–10.5)

## 2016-08-12 LAB — ABO/RH: ABO/RH(D): O POS

## 2016-08-12 LAB — VITAMIN B12: VITAMIN B 12: 801 pg/mL (ref 180–914)

## 2016-08-12 LAB — PREPARE RBC (CROSSMATCH)

## 2016-08-12 LAB — HEMOGLOBIN AND HEMATOCRIT, BLOOD
HEMATOCRIT: 26.8 % — AB (ref 36.0–46.0)
HEMOGLOBIN: 8.1 g/dL — AB (ref 12.0–15.0)

## 2016-08-12 SURGERY — CANCELLED PROCEDURE

## 2016-08-12 MED ORDER — ONDANSETRON HCL 4 MG PO TABS: 4.0000 mg | ORAL_TABLET | Freq: Four times a day (QID) | ORAL | Status: DC | PRN

## 2016-08-12 MED ORDER — HYDROCHLOROTHIAZIDE 12.5 MG PO CAPS
12.5000 mg | ORAL_CAPSULE | Freq: Every day | ORAL | Status: DC
  Administered 2016-08-12 – 2016-08-14 (×3): 12.5 mg via ORAL
  Filled 2016-08-12 (×3): qty 1

## 2016-08-12 MED ORDER — FAMOTIDINE IN NACL 20-0.9 MG/50ML-% IV SOLN
20.0000 mg | Freq: Two times a day (BID) | INTRAVENOUS | Status: DC
  Administered 2016-08-12 – 2016-08-13 (×4): 20 mg via INTRAVENOUS
  Filled 2016-08-12 (×6): qty 50

## 2016-08-12 MED ORDER — ACETAMINOPHEN 325 MG PO TABS: 650.0000 mg | ORAL_TABLET | Freq: Four times a day (QID) | ORAL | Status: DC | PRN

## 2016-08-12 MED ORDER — SODIUM CHLORIDE 0.9 % IV SOLN
INTRAVENOUS | Status: DC
  Administered 2016-08-12 – 2016-08-13 (×4): via INTRAVENOUS

## 2016-08-12 MED ORDER — LISINOPRIL 10 MG PO TABS
10.0000 mg | ORAL_TABLET | Freq: Every day | ORAL | Status: DC
  Administered 2016-08-12 – 2016-08-14 (×3): 10 mg via ORAL
  Filled 2016-08-12 (×3): qty 1

## 2016-08-12 MED ORDER — LISINOPRIL-HYDROCHLOROTHIAZIDE 10-12.5 MG PO TABS: 1.0000 | ORAL_TABLET | Freq: Every day | ORAL | Status: DC

## 2016-08-12 MED ORDER — ACETAMINOPHEN 650 MG RE SUPP: 650.0000 mg | Freq: Four times a day (QID) | RECTAL | Status: DC | PRN

## 2016-08-12 MED ORDER — ONDANSETRON HCL 4 MG/2ML IJ SOLN: 4.0000 mg | Freq: Four times a day (QID) | INTRAMUSCULAR | Status: DC | PRN

## 2016-08-12 NOTE — Progress Notes (Signed)
[facility] Consult: 12:25 PM 08/12/2016     LOS: 1 day    Referring Provider: Dr Michelle Barnett  Primary Care Physician:  Michelle Mannanalia Aron, MD Primary Gastroenterologist:  Dr. Lutricia [place]Paul [place] in [place].  But has seen Aundra MilletSkulskie, Elliot, Tiffany KocherWohl, Siegel as well.     Reason for Consultation:  GI bleed, anemia   HPI: Michelle GuppyCathy F Barnett is a 55 y.o. female.  Hx htn.  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 Last EGD 2014, Dr Bluford Kaufmannh.  Not sure what found then but has had findings of HH, gastritis/ulcers per pt and also possibly vascular ectasia. 2014 Path report located and showed acute erosive gastritis, no H Pylori present. No duodenal biopsies obtain for r/o villous atrophy.  Last colonoscopy ~ 2011 Dr Manfred Shirtsobert Elliot.  Pt has no recall of polyps, diverticulosis or LGI vascular lesions.  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   02/2010 esophageal manometry.  Unable to pull up report.   She denies dysmotility.  GES gastric emptying study 09/2012:  26% retention at 95 minutes, a positive study. Was seen a few times by a hematologis, [facility] Orlie [facility]inFinnegan, [facility].  Did not have bone marrow bx or get iron infusions.  Was treated with shots of B12.      Last transfusions in 2013 and doing well since then.  Hgb 13.2 in 12/2015, 10.9 on  06/16/16 and MCV had dropped from 93 to 92. TSH, B 12, all normal in 05/2016.  PMD started BID po Iron.  Routine labs and OV yesterday, Hgb down to 7.8.    2 weeks of black, tarry stools, foul smelling.  1 to 2 per day. + concomittant malaise, weakness.  No n/v or abd pain.  No anorexia.  No weight loss. No ASA, NSAIDs.  No ETOH.  n hx liver disease or clotting disorders.   [facility].  hgb now 8.1.      Past Medical History:  Diagnosis Date  . Bronchitis   . Heart murmur   . Hiatal hernia   . History of blood transfusion   . Hypertension   . Ulcer     Past Surgical History:  Procedure Laterality Date  . abdominal tumor    . ABLATION    . TOOTH EXTRACTION      Prior to Admission medications   Medication Sig Start Date End Date Taking? Authorizing Provider  albuterol (VENTOLIN HFA) 108 (90 Base) MCG/ACT inhaler INHALE 2 PUFFS INTO THE LUNGS EVERY 6 (SIX) HOURS AS NEEDED FOR WHEEZING OR SHORTNESS OF BREATH. 06/11/16   Dianne Dunalia M Aron, MD  ferrous sulfate 325 (65 FE) MG tablet Take 325 mg by mouth daily with breakfast.    Historical Provider, MD  folic acid (FOLVITE) 800 MCG tablet Take 400 mcg by mouth daily.    Historical Provider, MD  lisinopril-hydrochlorothiazide (PRINZIDE,ZESTORETIC) 10-12.5 MG tablet Take 1 tablet by mouth daily. 06/11/16  Dianne Dun, MD  magnesium oxide (MAG-OX) 400 MG tablet Take 400 mg by mouth daily.    Historical Provider, MD  Omega-3 Fatty Acids (FISH OIL) 1200 MG CAPS Take by mouth.    Historical Provider, MD  omeprazole (PRILOSEC) 20 MG capsule Take 20 mg by mouth daily.    Historical Provider, MD  vitamin B-12 (CYANOCOBALAMIN) 1000 MCG tablet Take 1,000 mcg by mouth daily.    Historical Provider, MD  vitamin E 400 UNIT capsule Take 400 Units by mouth daily.    Historical Provider, MD    Scheduled Meds: . famotidine (PEPCID) IV  20 mg Intravenous Q12H  . lisinopril  10 mg Oral Daily   And  . hydrochlorothiazide  12.5 mg Oral Daily   Infusions: . sodium chloride 125 mL/hr at 08/12/16 0358   PRN Meds: acetaminophen **OR** acetaminophen, ondansetron **OR** ondansetron (ZOFRAN) IV   Allergies as of 08/11/2016 - Review Complete 08/11/2016  Allergen Reaction Noted  . Dairy aid [lactase] Swelling 07/30/2014  . Penicillins  07/30/2014  . Benadryl [diphenhydramine] Palpitations 07/30/2014    Family History  Problem  Relation Age of Onset  . Stroke Mother   . Hypertension Mother   . Hypertension Father   . Heart disease Father   . Hypertension Brother   . Breast cancer Maternal Aunt     Social History   Social History  . Marital status: Single    Spouse name: N/A  . Number of children: N/A  . Years of education: N/A   Occupational History  . Not on file.   Social History Main Topics  . Smoking status: Never Smoker  . Smokeless tobacco: Never Used  . Alcohol use No  . Drug use: No  . Sexual activity: No   Other Topics Concern  . Not on file   Social History Narrative  . No narrative on file    REVIEW OF SYSTEMS: Constitutional:  Per HPI ENT:  No nose bleeds Pulm:  No SOB or cough CV:  No palpitations, no LE edema.  GU:  No hematuria, no frequency GI:  Per HPI Heme:  Other than dark stool, no unusual bleeding or bruising   Transfusions:  Per HPI Neuro:  No headaches, no peripheral tingling or numbness Derm:  No itching, no rash or sores.  Endocrine:  No sweats or chills.  No polyuria or dysuria Immunization:  Not queried Travel:  None beyond local counties in last few months.    PHYSICAL EXAM: Vital signs in last 24 hours: Vitals:   08/12/16 0318 08/12/16 0631  BP: (!) 131/59 131/66  Pulse: 91 92  Resp: 16 18  Temp: 98.4 F (36.9 C) 98.9 F (37.2 C)   Wt Readings from Last 3 Encounters:  08/12/16 61.7 kg (136 lb 1.6 oz)  08/11/16 59 kg (130 lb)  06/25/16 62.4 kg (137 lb 8 oz)    General: pleasant, petite.  Looks well.  Head:  No swelling, trauma or asymmetry  Eyes:  No icterus or pallor Ears:  Not HOH  Nose:  No congestion or discharge Mouth:  Clear and moist oral MM Neck:  No mass, no TMG Lungs:  Clear bil.  No cough or SOB Heart: RRR.  No MRG.  S1/S2 present Abdomen:  Soft, NT, ND.  No mass, bruits, hernias, HSM.   Rectal: deferred.     Musc/Skeltl: no joint swelling or redness Extremities:  No CCE.  Feet warm and well perfused  Neurologic:  Oriented  x  3.  Good recall of details.  Fully alert.  No limb weakness or tremor. Skin:  No rash or sores, no bruises   Psych:  Pleasant, a bit anxious but not agitated.  Fully engaged and normal affect.   Intake/Output from previous day: 08/15 0701 - 08/16 0700 In: 362.5 [I.V.:312.5; IV Piggyback:50] Out: 250 [Urine:250] Intake/Output this shift: Total I/O In: 0  Out: 750 [Urine:750]  LAB RESULTS:  Recent Labs  08/11/16 1216 08/11/16 1735 08/12/16 0533  WBC 5.1 5.9  --   HGB 7.4 cL* 7.8* 8.1*  HCT 23.1 Repeated and verified X2.* 24.4* 26.8*  PLT 254.0 252  --    BMET Lab Results  Component Value Date   NA 138 08/11/2016   NA 140 06/16/2016   NA 142 01/27/2016   K 4.0 08/11/2016   K 3.8 06/16/2016   K 3.9 01/27/2016   CL 104 08/11/2016   CL 103 06/16/2016   CL 103 01/27/2016   CO2 28 08/11/2016   CO2 33 (H) 06/16/2016   CO2 33 (H) 01/27/2016   GLUCOSE 101 (H) 08/11/2016   GLUCOSE 90 06/16/2016   GLUCOSE 86 01/27/2016   BUN 19 08/11/2016   BUN 16 06/16/2016   BUN 14 01/27/2016   CREATININE 0.50 08/11/2016   CREATININE 0.65 06/16/2016   CREATININE 0.73 01/27/2016   CALCIUM 9.0 08/11/2016   CALCIUM 9.6 06/16/2016   CALCIUM 9.5 01/27/2016   LFT  Recent Labs  08/11/16 1735  PROT 6.0*  ALBUMIN 3.5  AST 20  ALT 14  ALKPHOS 40  BILITOT 0.4   PT/INR Lab Results  Component Value Date   INR 1.0 04/28/2013   Hepatitis Panel No results for input(s): HEPBSAG, HCVAB, HEPAIGM, HEPBIGM in the last 72 hours. C-Diff No components found for: CDIFF Lipase  No results found for: LIPASE  Drugs of Abuse  No results found for: LABOPIA, COCAINSCRNUR, LABBENZ, AMPHETMU, THCU, LABBARB   RADIOLOGY STUDIES: No results found.  ENDOSCOPIC STUDIES: Per HPI  IMPRESSION:   *  Recurrent anemia and GIB with melenic stools in pt with hx blood loss anemia and gi bleeds due to ulcers, possible AVMs.  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 Cameron erosions?  S/p PRBC x 1.       PLAN:     *  ? EGD a/ colonoscopy.  Will d/w Dr Myrtie Neitheranis.     Jennye MoccasinSarah Gribbin  08/12/2016, 12:25 PM Pager: [phone]    I have reviewed the entire case in detail with the above APP and discussed the plan in detail.  Therefore, I agree with the diagnoses recorded above. In addition,  I have personally interviewed and examined the patient and have personally reviewed any abdominal/pelvic CT scan images.  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 She is having subacute blood loss with more acute drop in Hgb after it had been slowly dropping for several months.  She is agreeable to an EGD today.  If negative, colonoscopy, (timing TBD)    Charlie PitterHenry L Danis III Pager [phone]  Mon-Fri 8a-5p [phone] after 5p, weekends, holidays

## 2016-08-12 NOTE — Progress Notes (Signed)
NURSING PROGRESS NOTE  Michelle GuppyCathy F Barnett [id number] Admission Data: 08/12/2016 6:42 AM Attending Provider: Jeralyn BennettEzequiel Zamora, MD NFA:OZHYQPCP:Talia Dayton MartesAron, MD Code Status: Full  Michelle Barnett is a 55 y.o. female patient admitted from ED:  -No acute distress noted.  -No complaints of shortness of breath.  -No complaints of chest pain.   Blood pressure 131/59, pulse 91, temperature 98.4 F, resp. rate 16, height 4\' 8"  (1.422 m), weight 61.7 kg (136 lb 1.6 oz), SpO2 94 %.  IV Fluids:  IV in place, occlusive dsg intact without redness, IV cath hand left, condition patent and no redness and antecubital left, condition patent and no redness normal saline.   Allergies:  Dairy aid [lactase]; Penicillins; and Benadryl [diphenhydramine]  Past Medical History:   has a past medical history of Bronchitis; Heart murmur; Hiatal hernia; History of blood transfusion; Hypertension; and Ulcer.  Past Surgical History:   has a past surgical history that includes Ablation; Tooth extraction; and abdominal tumor.  Social History:   reports that she has never smoked. She has never used smokeless tobacco. She reports that she does not drink alcohol or use drugs.  Skin: intact  Patient/Family orientated to room. Information packet given to patient/family. Admission inpatient armband information verified with patient/family to include name and date of birth and placed on patient arm. Side rails up x 2, fall assessment and education completed with patient/family. Patient/family able to verbalize understanding of risk associated with falls and verbalized understanding to call for assistance before getting out of bed. Call light within reach. Patient/family able to voice and demonstrate understanding of unit orientation instructions.

## 2016-08-12 NOTE — Progress Notes (Signed)
PROGRESS NOTE    Michelle GuppyCathy F Barnett  ZOX:[id number] DOB: 03-Apr-1961 DOA: 08/12/2016 PCP: Ruthe Mannanalia Aron, MD   Brief Narrative:  Michelle Barnett is a 55 y/o with a past medical history of upper GI bleed, having previous hospitalizations undergoing multiple blood transfusions, status post 5 EGDs and 2 colonoscopies, reporting that she has been diagnosed with peptic ulcer disease in the past, presented with complaints of generalized weakness, fatigue, dark stools. She presented as a transfer from [facility]. Lab work revealed hemoglobin of 7.4 for which she was transfused 1 unit of packed red blood cells with hemoglobin improving to 8.1. He reports feeling significantly better after receiving blood transfusion. GI was consulted   Assessment & Plan:   Principal Problem:   GI bleed Active Problems:   Anemia, iron deficiency   Essential hypertension   Acute blood loss anemia  1.  Acute blood loss anemia -She presents with symptomatic anemia reporting generalized weakness, fatigue, poor tolerance to physical exertion as lab work revealed hemoglobin of 7.4. She reported having black tarry stools. -Status post transfusion with 1 unit of packed red blood cells with repeat hemoglobin of 8.1 -Having symptomatically improvement posttransfusion. -Suspect blood loss anemia coming from upper GI tract.  2.  Suspected upper GI bleed -She reports having history of GI bleeds related to peptic ulcer disease -She was not anticoagulated prior to this hospitalization -Plan to continue supportive care, repeat H&H this afternoon, consider transfusing another unit of blood if hemoglobin trends down. -GI was consulted for further recommendations  3.  Hypertension -Blood pressure stable, last blood pressure 131/66 -She is currently on lisinopril/hydrochlorothiazide 10/12.5 one tablet by mouth daily   DVT prophylaxis: SCD's Code Status: Full code Family Communication:  Disposition Plan:   Consultants:    GI  Procedures    Subjective: She states feeling little better after receiving blood transfusion, reports having black stool overnight  Objective: Vitals:   08/12/16 0213 08/12/16 0318 08/12/16 0631  BP:  (!) 131/59 131/66  Pulse:  91 92  Resp:  16 18  Temp:  98.4 F (36.9 C) 98.9 F (37.2 C)  TempSrc:  Oral   SpO2:  94% 95%  Weight: 61.7 kg (136 lb 1.6 oz)    Height: 4\' 8"  (1.422 m)      Intake/Output Summary (Last 24 hours) at 08/12/16 1418 Last data filed at 08/12/16 1322  Gross per 24 hour  Intake             1225 ml  Output             1000 ml  Net              225 ml   Filed Weights   08/12/16 0213  Weight: 61.7 kg (136 lb 1.6 oz)    Examination:  General exam: Appears calm and comfortable, Mentating well Respiratory system: Clear to auscultation. Respiratory effort normal. Cardiovascular system: S1 & S2 heard, RRR. No JVD, murmurs, rubs, gallops or clicks. No pedal edema. Gastrointestinal system: There is some epigastric pain with palpation Central nervous system: Alert and oriented. No focal neurological deficits. Extremities: Symmetric 5 x 5 power. Skin: No rashes, lesions or ulcers Psychiatry: Judgement and insight appear normal. Mood & affect appropriate.     Data Reviewed: I have personally reviewed following labs and imaging studies  CBC:  Recent Labs Lab 08/11/16 1216 08/11/16 1735 08/12/16 0533  WBC 5.1 5.9  --   NEUTROABS 3.7  --   --  HGB 7.4 cL* 7.8* 8.1*  HCT 23.1 Repeated and verified X2.* 24.4* 26.8*  MCV 90.4 91.2  --   PLT 254.0 252  --    Basic Metabolic Panel:  Recent Labs Lab 08/11/16 1735  NA 138  K 4.0  CL 104  CO2 28  GLUCOSE 101*  BUN 19  CREATININE 0.50  CALCIUM 9.0   GFR: Estimated Creatinine Clearance: 57.6 mL/min (by C-G formula based on SCr of 0.8 mg/dL). Liver Function Tests:  Recent Labs Lab 08/11/16 1735  AST 20  ALT 14  ALKPHOS 40  BILITOT 0.4  PROT 6.0*  ALBUMIN 3.5   No results  for input(s): LIPASE, AMYLASE in the last 168 hours. No results for input(s): AMMONIA in the last 168 hours. Coagulation Profile: No results for input(s): INR, PROTIME in the last 168 hours. Cardiac Enzymes: No results for input(s): CKTOTAL, CKMB, CKMBINDEX, TROPONINI in the last 168 hours. BNP (last 3 results) No results for input(s): PROBNP in the last 8760 hours. HbA1C: No results for input(s): HGBA1C in the last 72 hours. CBG: No results for input(s): GLUCAP in the last 168 hours. Lipid Profile: No results for input(s): CHOL, HDL, LDLCALC, TRIG, CHOLHDL, LDLDIRECT in the last 72 hours. Thyroid Function Tests: No results for input(s): TSH, T4TOTAL, FREET4, T3FREE, THYROIDAB in the last 72 hours. Anemia Panel:  Recent Labs  08/11/16 1216 08/12/16 0816  VITAMINB12  --  801  FERRITIN 8.5*  --    Sepsis Labs: No results for input(s): PROCALCITON, LATICACIDVEN in the last 168 hours.  No results found for this or any previous visit (from the past 240 hour(s)).       Radiology Studies: No results found.      Scheduled Meds: . famotidine (PEPCID) IV  20 mg Intravenous Q12H  . lisinopril  10 mg Oral Daily   And  . hydrochlorothiazide  12.5 mg Oral Daily   Continuous Infusions: . sodium chloride 125 mL/hr at 08/12/16 1323     LOS: 1 day    Time spent: 30 min    Jeralyn BennettZAMORA, Kaled Allende, MD [facility] Pager [phone]  If 7PM-7AM, please contact night-coverage [email or web address] Password [email or web address] 08/12/2016, 2:18 PM

## 2016-08-12 NOTE — H&P (Signed)
History and Physical  Patient Name: Michelle GuppyCathy F Bloomquist     ZOX:[id number]    DOB: [birth year]    DOA: 08/12/2016 PCP: Ruthe Mannanalia Aron, MD   Patient coming from: Home  Chief Complaint: Fatigue, shortness of breath  HPI: Michelle GuppyCathy F Holwerda is a 55 y.o. female with a past medical history significant for HTN who presents with melena and shortness of breath.  The patient was in her usual state of health until late June when she was noted to be anemic on routine pre-physical lab work. Hemoglobin in January had been 13, in June was 10.6 g/dL. She was started on iron. At some point in the interim she had noticed "dark stools", foul-smelling, sometimes large, but she didn't think much of it.  Then, over the last three weeks she had increasing shortness of breath with exertion and fatigue.  Today, she had routine follow up lab work to evaluate her ferritin and hemoglobin response to iron therapy, and was noted to have ferritin 8.5 ng/mL and Hgb now 7.4 g/dL and so she was told to go to the ER.  ED course: -Afebrile, tachycardic, respirations and blood pressure normal, not hypoxic -Na 138, K 4.0, Cr 0.5 (baseline), WBC 5.9K, Hgb 7.8 -FOBT + -She was given IV Pepcid and transfused 1 unit of PRBCs and discussed with Dr. Lavon PaganiniNandigam of GI who [facility]  She does not use anticoagulants.  She does not use NSAIDs.  She does not drink alcohol.  She had a bleeding ulcer years ago, used to follow with Dr. Bluford Kaufmannh of [place], but he has moved away.  She was on a PPI for a time, not recently, thought that it caused eye side effects of some kind.      She also reports having had "twenty units transfused" in her lifetime, over several occasions (reports her lowest hemoglobin was "3 and a half").  [facility].  Always told the bleeding was from her stomach.      ROS: Review of Systems  Constitutional: Positive for malaise/fatigue. Negative for chills, diaphoresis, fever and  weight loss.  HENT: Negative.   Eyes: Negative.   Respiratory: Positive for shortness of breath.   Cardiovascular: Negative.   Gastrointestinal: Positive for melena. Negative for abdominal pain, blood in stool, constipation, diarrhea, heartburn, nausea and vomiting.  Genitourinary: Negative.   Musculoskeletal: Negative.   Skin: Negative for itching and rash.  Neurological: Positive for weakness.  Endo/Heme/Allergies: Negative.   Psychiatric/Behavioral: Negative.          Past Medical History:  Diagnosis Date  . Bronchitis   . Heart murmur   . Hiatal hernia   . History of blood transfusion   . Hypertension   . Ulcer     Past Surgical History:  Procedure Laterality Date  . abdominal tumor    . ABLATION    . TOOTH EXTRACTION      Social History: Patient lives alone.  The patient walks unassisted.  She works for [employer] at [employer] [place] [employer].  She does not smoke or use alcohol.  Allergies  Allergen Reactions  . Dairy Aid [Lactase] Swelling    Any dairy products  . Penicillins   . Benadryl [Diphenhydramine] Palpitations    Family history: family history includes Breast cancer in her maternal aunt; Heart disease in her father; Hypertension in her brother, father, and mother; Stroke in her mother.  Prior to Admission medications   Medication Sig Start Date  End Date Taking? Authorizing Provider  albuterol (VENTOLIN HFA) 108 (90 Base) MCG/ACT inhaler INHALE 2 PUFFS INTO THE LUNGS EVERY 6 (SIX) HOURS AS NEEDED FOR WHEEZING OR SHORTNESS OF BREATH. 06/11/16   Dianne Dunalia M Aron, MD  ferrous sulfate 325 (65 FE) MG tablet Take 325 mg by mouth daily with breakfast.    Historical Provider, MD  folic acid (FOLVITE) 800 MCG tablet Take 400 mcg by mouth daily.    Historical Provider, MD  lisinopril-hydrochlorothiazide (PRINZIDE,ZESTORETIC) 10-12.5 MG tablet Take 1 tablet by mouth daily. 06/11/16   Dianne Dunalia M Aron, MD  magnesium oxide (MAG-OX) 400 MG tablet Take 400 mg by mouth daily.     Historical Provider, MD  Omega-3 Fatty Acids (FISH OIL) 1200 MG CAPS Take by mouth.    Historical Provider, MD  omeprazole (PRILOSEC) 20 MG capsule Take 20 mg by mouth daily.    Historical Provider, MD  vitamin B-12 (CYANOCOBALAMIN) 1000 MCG tablet Take 1,000 mcg by mouth daily.    Historical Provider, MD  vitamin E 400 UNIT capsule Take 400 Units by mouth daily.    Historical Provider, MD       Physical Exam: BP (!) 131/59 (BP Location: Right Arm)   Pulse 91   Temp 98.4 F (36.9 C) (Oral)   Resp 16   Ht 4\' 8"  (1.422 m)   Wt 61.7 kg (136 lb 1.6 oz)   SpO2 94%   BMI 30.51 kg/m  General appearance: Well-developed, adult female, alert and in no acute distress.   Eyes: Anicteric, conjunctiva pink actually, lids and lashes normal.     ENT: No nasal deformity, discharge, or epistaxis.  OP moist without lesions.   Skin: Pale.  Warm and dry.  No jaundice.  No suspicious rashes or lesions. Cardiac: RRR, nl S1-S2, systolic flow murmur.  Capillary refill is brisk.  JVP normal.  No LE edema.  Radial and DP pulses 2+ and symmetric. Respiratory: Normal respiratory rate and rhythm.  CTAB without rales or wheezes. GI: Abdomen soft without rigidity.  No TTP. No ascites, distension, hepatosplenomegaly.   MSK: No deformities or effusions.  No clubbing/cyanosis. Neuro: Cranial nerves normal. Sensorium intact and responding to questions, attention normal.  Speech is fluent.  Moves all extremities equally and with normal coordination.    Psych: Affect normal.  Judgment and insight appear normal.       Labs on Admission:  I have personally reviewed following labs and imaging studies: CBC:  Recent Labs Lab 08/11/16 1216 08/11/16 1735  WBC 5.1 5.9  NEUTROABS 3.7  --   HGB 7.4 cL* 7.8*  HCT 23.1 Repeated and verified X2.* 24.4*  MCV 90.4 91.2  PLT 254.0 252   Basic Metabolic Panel:  Recent Labs Lab 08/11/16 1735  NA 138  K 4.0  CL 104  CO2 28  GLUCOSE 101*  BUN 19  CREATININE 0.50    CALCIUM 9.0   GFR: Estimated Creatinine Clearance: 57.6 mL/min (by C-G formula based on SCr of 0.8 mg/dL).  Liver Function Tests:  Recent Labs Lab 08/11/16 1735  AST 20  ALT 14  ALKPHOS 40  BILITOT 0.4  PROT 6.0*  ALBUMIN 3.5   Anemia Panel:  Recent Labs  08/11/16 1216  FERRITIN 8.5*        Radiological Exams on Admission: Personally reviewed: No results found.  EKG: Independently reviewed. Rate 86, QTc normal.  No ischemic changes.    Assessment/Plan 1. GI Bleed with acute blood loss anemia:  Slow probably upper  GI bleed since June.  B12 in June was normal.  Ferritin at that time was very low.  Taking iron since June.  Symptomatic in last few weeks, got transfused 1 unit at OSH.  Not symptomatic now. -Famotidine IV BID -NPO and MIVF -Consult to GI, appreciate cares -Check type and screen and trend CBC q12 hrs for now -Restart iron when able to take PO -Will check B12 and folate stores   2. HTN:  -Continue lisinopril-HCTZ       DVT prophylaxis: SCDs  Code Status: FULL  Family Communication: None preesnt  Disposition Plan: Anticipate start antacid and trend CBC.  Consult to GI.  Further disposition pending specialty consult. Consults called: GI, Dr. Lavon Paganini Admission status: OBS, med surg At the point of initial evaluation, it is my clinical opinion that admission for OBSERVATION is reasonable and necessary because the patient's presenting complaints in the context of their chronic conditions represent sufficient risk of deterioration or significant morbidity to constitute reasonable grounds for close observation in the hospital setting, but that the patient may be medically stable for discharge from the hospital within 24 to 48 hours.    Medical decision making: Patient seen at 3:31 AM on 08/12/2016.  The patient was discussed with Dr. Antionette Char. What exists of the patient's chart was reviewed in depth.  Clinical condition: stable.        Alberteen Sam [facility] Pager [phone]

## 2016-08-12 NOTE — ED Notes (Addendum)
[facility] called, [facility].

## 2016-08-12 NOTE — Progress Notes (Signed)
EGD for 08/12/16 cancelled due to room and MD availability. Patient made aware by Dr. Myrtie Neitheranis. Primary RN Tresa EndoKelly notified. Patient expresses understanding and agrees.

## 2016-08-13 ENCOUNTER — Encounter (HOSPITAL_COMMUNITY): Payer: Self-pay | Admitting: *Deleted

## 2016-08-13 ENCOUNTER — Encounter (HOSPITAL_COMMUNITY)
Admission: EM | Disposition: A | Discharge status: Home / Self Care | Payer: Self-pay | Source: Other Acute Inpatient Hospital | Attending: Internal Medicine

## 2016-08-13 DIAGNOSIS — K922 Gastrointestinal hemorrhage, unspecified: Secondary | ICD-10-CM

## 2016-08-13 HISTORY — PX: ESOPHAGOGASTRODUODENOSCOPY: SHX5428

## 2016-08-13 LAB — TYPE AND SCREEN
ABO/RH(D): O POS
ANTIBODY SCREEN: NEGATIVE
UNIT DIVISION: 0
Unit division: 0
Unit division: 0
Unit division: 0

## 2016-08-13 LAB — BASIC METABOLIC PANEL
Anion gap: 10 (ref 5–15)
CALCIUM: 8.7 mg/dL — AB (ref 8.9–10.3)
CHLORIDE: 106 mmol/L (ref 101–111)
CO2: 25 mmol/L (ref 22–32)
CREATININE: 0.64 mg/dL (ref 0.44–1.00)
GFR calc non Af Amer: >60 mL/min (ref >60)
Glucose, Bld: 88 mg/dL (ref 65–99)
Potassium: 3.9 mmol/L (ref 3.5–5.1)
SODIUM: 141 mmol/L (ref 135–145)

## 2016-08-13 LAB — CBC
HCT: 26.4 % — ABNORMAL LOW (ref 36.0–46.0)
Hemoglobin: 7.9 g/dL — ABNORMAL LOW (ref 12.0–15.0)
MCH: 28.6 pg (ref 26.0–34.0)
MCHC: 29.9 g/dL — AB (ref 30.0–36.0)
MCV: 95.7 fL (ref 78.0–100.0)
Platelets: 207 10*3/uL (ref 150–400)
RBC: 2.76 MIL/uL — ABNORMAL LOW (ref 3.87–5.11)
RDW: 17.3 % — AB (ref 11.5–15.5)
WBC: 4 10*3/uL (ref 4.0–10.5)

## 2016-08-13 LAB — FOLATE RBC
Folate, Hemolysate: >620 ng/mL
HEMATOCRIT: 26.7 % — AB (ref 34.0–46.6)

## 2016-08-13 LAB — PREPARE RBC (CROSSMATCH)

## 2016-08-13 SURGERY — EGD (ESOPHAGOGASTRODUODENOSCOPY)
Anesthesia: Moderate Sedation

## 2016-08-13 MED ORDER — PANTOPRAZOLE SODIUM 40 MG PO TBEC
40.0000 mg | DELAYED_RELEASE_TABLET | Freq: Every day | ORAL | Status: DC
  Administered 2016-08-13 – 2016-08-14 (×2): 40 mg via ORAL
  Filled 2016-08-13 (×2): qty 1

## 2016-08-13 MED ORDER — PEG-KCL-NACL-NASULF-NA ASC-C 100 G PO SOLR: 1.0000 | Freq: Once | ORAL | Status: DC

## 2016-08-13 MED ORDER — PEG-KCL-NACL-NASULF-NA ASC-C 100 G PO SOLR
0.5000 | Freq: Once | ORAL | Status: AC
  Administered 2016-08-14: 100 g via ORAL

## 2016-08-13 MED ORDER — PEG-KCL-NACL-NASULF-NA ASC-C 100 G PO SOLR
0.5000 | Freq: Once | ORAL | Status: AC
  Administered 2016-08-13: 100 g via ORAL
  Filled 2016-08-13: qty 1

## 2016-08-13 MED ORDER — BISACODYL 5 MG PO TBEC
10.0000 mg | DELAYED_RELEASE_TABLET | Freq: Four times a day (QID) | ORAL | Status: AC
  Administered 2016-08-13 (×2): 10 mg via ORAL
  Filled 2016-08-13 (×2): qty 2

## 2016-08-13 MED ORDER — SODIUM CHLORIDE 0.9 % IV SOLN
INTRAVENOUS | Status: DC
  Administered 2016-08-14: 06:00:00 via INTRAVENOUS

## 2016-08-13 MED ORDER — MIDAZOLAM HCL 5 MG/ML IJ SOLN
INTRAMUSCULAR | Status: AC
  Filled 2016-08-13: qty 2

## 2016-08-13 MED ORDER — MIDAZOLAM HCL 10 MG/2ML IJ SOLN
INTRAMUSCULAR | Status: DC | PRN
  Administered 2016-08-13: 2 mg via INTRAVENOUS
  Administered 2016-08-13: 1 mg via INTRAVENOUS

## 2016-08-13 MED ORDER — BUTAMBEN-TETRACAINE-BENZOCAINE 2-2-14 % EX AERO
INHALATION_SPRAY | CUTANEOUS | Status: DC | PRN
  Administered 2016-08-13: 2 via TOPICAL

## 2016-08-13 MED ORDER — SODIUM CHLORIDE 0.9 % IV SOLN: INTRAVENOUS | Status: DC

## 2016-08-13 MED ORDER — METOCLOPRAMIDE HCL 5 MG/ML IJ SOLN
10.0000 mg | Freq: Once | INTRAMUSCULAR | Status: AC
  Administered 2016-08-14: 10 mg via INTRAVENOUS
  Filled 2016-08-13: qty 2

## 2016-08-13 MED ORDER — FENTANYL CITRATE (PF) 100 MCG/2ML IJ SOLN
INTRAMUSCULAR | Status: DC | PRN
  Administered 2016-08-13 (×2): 25 ug via INTRAVENOUS

## 2016-08-13 MED ORDER — METOCLOPRAMIDE HCL 5 MG/ML IJ SOLN
10.0000 mg | Freq: Once | INTRAMUSCULAR | Status: AC
  Administered 2016-08-13: 10 mg via INTRAVENOUS
  Filled 2016-08-13: qty 2

## 2016-08-13 MED ORDER — SODIUM CHLORIDE 0.9 % IV SOLN: Freq: Once | INTRAVENOUS | Status: DC

## 2016-08-13 MED ORDER — FENTANYL CITRATE (PF) 100 MCG/2ML IJ SOLN
INTRAMUSCULAR | Status: AC
  Filled 2016-08-13: qty 2

## 2016-08-13 NOTE — Progress Notes (Signed)
PROGRESS NOTE    Michelle Barnett  [id number]:[id number] DOB: May 18, 1961 DOA: 08/12/2016 PCP: Arnette Norris, MD   Brief Narrative:  [name] is a 55 y/o with a past medical history of upper GI bleed, having previous hospitalizations undergoing multiple blood transfusions, status post 5 EGDs and 2 colonoscopies, reporting that she has been diagnosed with peptic ulcer disease in the past, presented with complaints of generalized weakness, fatigue, dark stools. She presented as a transfer from [facility] Lab work revealed hemoglobin of 7.4 for which she was transfused 1 unit of packed red blood cells with hemoglobin improving to 8.1. He reports feeling significantly better after receiving blood transfusion. GI was consulted   Assessment & Plan:   Principal Problem:   GI bleed Active Problems:   Anemia, iron deficiency   Essential hypertension   Acute blood loss anemia   Melena  1.  Acute blood loss anemia -She presents with symptomatic anemia reporting generalized weakness, fatigue, poor tolerance to physical exertion as lab work revealed hemoglobin of 7.4. She reported having black tarry stools. -Status post transfusion with 1 unit of packed red blood cells with repeat hemoglobin of 8.1 -On 08/13/2016 lab work showed a drop in hemoglobin to 7.9 from 9.4. We'll transfuse an additional unit of blood  2.  Suspected upper GI bleed -She reports having history of GI bleeds related to peptic ulcer disease -She was not anticoagulated prior to this hospitalization -On 08/13/2016 she underwent upper endoscopy that did not reveal evidence for GI bleed, esophagus was normal, cardia and gastric fundus normal, duodenum normal. She did have a large hiatal hernia. -Plan for colonoscopy on 08/14/2016  3.  Hypertension -Blood pressure stable, last blood pressure 129/61 -She is currently on lisinopril/hydrochlorothiazide 10/12.5 one tablet by mouth daily   DVT prophylaxis: SCD's Code Status:  Full code Family Communication:  Disposition Plan:   Consultants:   GI  Procedures    Subjective: She appears to be in good spirits today she is awake and alert  Objective: Vitals:   08/13/16 0930 08/13/16 1311 08/13/16 1326 08/13/16 1634  BP: 117/74 (!) 145/60 117/70 129/61  Pulse: 84 86 88 79  Resp: (!) _0 Temp:  97.4 F (36.3 C) 98 F (36.7 C) 98.2 F (36.8 C)  TempSrc:  Oral Oral Oral  SpO2: 98% 98% 96% 96%  Weight:      Height:        Intake/Output Summary (Last 24 hours) at 08/13/16 1700 Last data filed at 08/13/16 1629  Gross per 24 hour  Intake             2290 ml  Output              450 ml  Net             1840 ml   Filed Weights   08/12/16 0213  Weight: 61.7 kg (136 lb 1.6 oz)    Examination:  General exam: Appears calm and comfortable, Mentating well Respiratory system: Clear to auscultation. Respiratory effort normal. Cardiovascular system: S1 & S2 heard, RRR. No JVD, murmurs, rubs, gallops or clicks. No pedal edema. Gastrointestinal system: There is some epigastric pain with palpation Central nervous system: Alert and oriented. No focal neurological deficits. Extremities: Symmetric 5 x 5 power. Skin: No rashes, lesions or ulcers Psychiatry: Judgement and insight appear normal. Mood & affect appropriate.     Data Reviewed: I have personally reviewed following labs and imaging studies  CBC:  Recent Labs Lab 08/11/16 1216 08/11/16 1735 08/12/16 0533 08/12/16 0816 08/12/16 1651 08/13/16 0534  WBC 5.1 5.9  --   --  6.6 4.0  NEUTROABS 3.7  --   --   --   --   --   HGB 7.4 cL* 7.8* 8.1*  --  9.4* 7.9*  HCT 23.1 Repeated and verified X2.* 24.4* 26.8* 26.7* 30.8* 26.4*  MCV 90.4 91.2  --   --  95.1 95.7  PLT 254.0 252  --   --  239 767   Basic Metabolic Panel:  Recent Labs Lab 08/11/16 1735 08/13/16 0534  NA 138 141  K 4.0 3.9  CL 104 106  CO2 28 25  GLUCOSE 101* 88  BUN 19 <5*  CREATININE 0.50 0.64  CALCIUM 9.0  8.7*   GFR: Estimated Creatinine Clearance: 57.6 mL/min (by C-G formula based on SCr of 0.8 mg/dL). Liver Function Tests:  Recent Labs Lab 08/11/16 1735  AST 20  ALT 14  ALKPHOS 40  BILITOT 0.4  PROT 6.0*  ALBUMIN 3.5   No results for input(s): LIPASE, AMYLASE in the last 168 hours. No results for input(s): AMMONIA in the last 168 hours. Coagulation Profile: No results for input(s): INR, PROTIME in the last 168 hours. Cardiac Enzymes: No results for input(s): CKTOTAL, CKMB, CKMBINDEX, TROPONINI in the last 168 hours. BNP (last 3 results) No results for input(s): PROBNP in the last 8760 hours. HbA1C: No results for input(s): HGBA1C in the last 72 hours. CBG: No results for input(s): GLUCAP in the last 168 hours. Lipid Profile: No results for input(s): CHOL, HDL, LDLCALC, TRIG, CHOLHDL, LDLDIRECT in the last 72 hours. Thyroid Function Tests: No results for input(s): TSH, T4TOTAL, FREET4, T3FREE, THYROIDAB in the last 72 hours. Anemia Panel:  Recent Labs  08/11/16 1216 08/12/16 0816  VITAMINB12  --  801  FERRITIN 8.5*  --    Sepsis Labs: No results for input(s): PROCALCITON, LATICACIDVEN in the last 168 hours.  No results found for this or any previous visit (from the past 240 hour(s)).       Radiology Studies: No results found.      Scheduled Meds: . sodium chloride   Intravenous Once  . bisacodyl  10 mg Oral Q6H  . lisinopril  10 mg Oral Daily   And  . hydrochlorothiazide  12.5 mg Oral Daily  . metoCLOPramide (REGLAN) injection  10 mg Intravenous Once   Followed by  . [START ON 08/14/2016] metoCLOPramide (REGLAN) injection  10 mg Intravenous Once  . pantoprazole  40 mg Oral Q0600  . peg 3350 powder  0.5 kit Oral Once   And  . [START ON 08/14/2016] peg 3350 powder  0.5 kit Oral Once   Continuous Infusions: . [START ON 08/14/2016] sodium chloride       LOS: 1 day    Time spent: 25 min    Kelvin Cellar, MD [facility] Pager  [phone]  If 7PM-7AM, please contact night-coverage [email or web address] Password [email or web address] 08/13/2016, 5:00 PM

## 2016-08-13 NOTE — Op Note (Signed)
[facility] Patient Name: Michelle RamsayCathy Wilcoxson Procedure Date : 08/13/2016 MRN: [id number] Attending MD: Starr LakeHenry L. Myrtie Barnett , MD Date of Birth: August 16, 1961 CSN: [id number] Age: 55 Admit Type: Inpatient Procedure:                Upper GI endoscopy Indications:              Acute post hemorrhagic anemia, Melena Providers:                Sherilyn CooterHenry L. Myrtie Neitheranis, MD, Michel BickersLisa R. Straughn, RN, Arlee Muslimhris                            Chandler Tech., Technician Referring MD:              Medicines:                The level of sedation administered was moderate,                            Midazolam 3 mg IV, Fentanyl 50 micrograms IV,                            Cetacaine spray Complications:            No immediate complications. Estimated Blood Loss:     Estimated blood loss: none. Procedure:                Pre-Anesthesia Assessment:                           - Prior to the procedure, a History and Physical                            was performed, and patient medications and                            allergies were reviewed. The patient's tolerance of                            previous anesthesia was also reviewed. The risks                            and benefits of the procedure and the sedation                            options and risks were discussed with the patient.                            All questions were answered, and informed consent                            was obtained. Prior Anticoagulants: The patient has                            taken no previous anticoagulant or antiplatelet  agents. ASA Grade Assessment: II - A patient with                            mild systemic disease. After reviewing the risks                            and benefits, the patient was deemed in                            satisfactory condition to undergo the procedure.                           After obtaining informed consent, the endoscope was                            passed  under direct vision. Throughout the                            procedure, the patient's blood pressure, pulse, and                            oxygen saturations were monitored continuously. The                            EG-[id number] ([id number] scope was introduced through the                            mouth, and advanced to the third part of duodenum.                            The upper GI endoscopy was performed with                            difficulty due to large hiatal hernia. The patient                            tolerated the procedure well. Scope In: Scope Out: Findings:      The esophagus was normal.      A large hiatal hernia was present (30-40 cm from incisors). No ulcers or       other bleeding sources were seen.      The examined duodenum was normal. No ulcers,AVMs or other bleeding       sources were seen.      The cardia and gastric fundus were normal on retroflexion. Impression:               - Normal esophagus.                           - Large hiatal hernia.                           - Normal examined duodenum.                           -  No specimens collected. Moderate Sedation:      Moderate (conscious) sedation was administered by the endoscopy nurse       and supervised by the endoscopist. The following parameters were       monitored: oxygen saturation, heart rate, blood pressure, respiratory       rate, EKG, adequacy of pulmonary ventilation, and response to care.       Total physician intraservice time was 9 minutes. Recommendation:           - Clear liquid diet.                           - Perform a colonoscopy tomorrow.                           - Continue present medications. Procedure Code(s):        --- Professional ---                           [id number], Esophagogastroduodenoscopy, flexible,                            transoral; diagnostic, including collection of                            specimen(s) by brushing or washing, when performed                             (separate procedure) Diagnosis Code(s):        --- Professional ---                           K44.9, Diaphragmatic hernia without obstruction or                            gangrene                           D62, Acute posthemorrhagic anemia                           K92.1, Melena (includes Hematochezia) CPT copyright 2016 [employer]. All rights reserved. The codes documented in this report are preliminary and upon coder review may  be revised to meet current compliance requirements. Michelle Adrian L. Myrtie Neither, MD 08/13/2016 9:59:22 AM This report has been signed electronically. Number of Addenda: 0

## 2016-08-13 NOTE — Interval H&P Note (Signed)
History and Physical Interval Note:  08/13/2016 9:04 AM  Michelle Barnett  has presented today for surgery, with the diagnosis of anemia, gi bleed, black stools  The various methods of treatment have been discussed with the patient and family. After consideration of risks, benefits and other options for treatment, the patient has consented to  Procedure(s): ESOPHAGOGASTRODUODENOSCOPY (EGD) (N/A) as a surgical intervention .  The patient's history has been reviewed, patient examined, no change in status, stable for surgery.  I have reviewed the patient's chart and labs.  Questions were answered to the patient's satisfaction.     Charlie PitterHenry L Danis III

## 2016-08-13 NOTE — H&P (View-Only) (Signed)
[facility] Consult: 12:25 PM 08/12/2016     LOS: 1 day    Referring Provider: Dr Vanessa BarbaraZamora  Primary Care Physician:  Ruthe Mannanalia Aron, MD Primary Gastroenterologist:  Dr. Lutricia FeilPaul [place] in [place].  But has seen Aundra MilletSkulskie, Elliot, Tiffany KocherWohl, Siegel as well.     Reason for Consultation:  GI bleed, anemia   HPI: Michelle Barnett is a 55 y.o. female.  Hx htn.  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 Last EGD 2014, Dr Bluford Kaufmannh.  Not sure what found then but has had findings of HH, gastritis/ulcers per pt and also possibly vascular ectasia. 2014 Path report located and showed acute erosive gastritis, no H Pylori present. No duodenal biopsies obtain for r/o villous atrophy.  Last colonoscopy ~ 2011 Dr Manfred Shirtsobert Elliot.  Pt has no recall of polyps, diverticulosis or LGI vascular lesions.  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   02/2010 esophageal manometry.  Unable to pull up report.   She denies dysmotility.  GES gastric emptying study 09/2012:  26% retention at 95 minutes, a positive study. Was seen a few times by a hematologis, Dr Orlie DakinFinnegan, [facility].  Did not have bone marrow bx or get iron infusions.  Was treated with shots of B12.      Last transfusions in 2013 and doing well since then.  Hgb 13.2 in 12/2015, 10.9 on  06/16/16 and MCV had dropped from 93 to 92. TSH, B 12, all normal in 05/2016.  PMD started BID po Iron.  Routine labs and OV yesterday, Hgb down to 7.8.    2 weeks of black, tarry stools, foul smelling.  1 to 2 per day. + concomittant malaise, weakness.  No n/v or abd pain.  No anorexia.  No weight loss. No ASA, NSAIDs.  No ETOH.  n hx liver disease or clotting disorders.   [facility].  hgb now 8.1.      Past Medical History:  Diagnosis Date  . Bronchitis   . Heart murmur   . Hiatal hernia   . History of blood transfusion   . Hypertension   . Ulcer     Past Surgical History:  Procedure Laterality Date  . abdominal tumor    . ABLATION    . TOOTH EXTRACTION      Prior to Admission medications   Medication Sig Start Date End Date Taking? Authorizing Provider  albuterol (VENTOLIN HFA) 108 (90 Base) MCG/ACT inhaler INHALE 2 PUFFS INTO THE LUNGS EVERY 6 (SIX) HOURS AS NEEDED FOR WHEEZING OR SHORTNESS OF BREATH. 06/11/16   Dianne Dunalia M Aron, MD  ferrous sulfate 325 (65 FE) MG tablet Take 325 mg by mouth daily with breakfast.    Historical Provider, MD  folic acid (FOLVITE) 800 MCG tablet Take 400 mcg by mouth daily.    Historical Provider, MD  lisinopril-hydrochlorothiazide (PRINZIDE,ZESTORETIC) 10-12.5 MG tablet Take 1 tablet by mouth daily. 06/11/16  Dianne Dun, MD  magnesium oxide (MAG-OX) 400 MG tablet Take 400 mg by mouth daily.    Historical Provider, MD  Omega-3 Fatty Acids (FISH OIL) 1200 MG CAPS Take by mouth.    Historical Provider, MD  omeprazole (PRILOSEC) 20 MG capsule Take 20 mg by mouth daily.    Historical Provider, MD  vitamin B-12 (CYANOCOBALAMIN) 1000 MCG tablet Take 1,000 mcg by mouth daily.    Historical Provider, MD  vitamin E 400 UNIT capsule Take 400 Units by mouth daily.    Historical Provider, MD    Scheduled Meds: . famotidine (PEPCID) IV  20 mg Intravenous Q12H  . lisinopril  10 mg Oral Daily   And  . hydrochlorothiazide  12.5 mg Oral Daily   Infusions: . sodium chloride 125 mL/hr at 08/12/16 0358   PRN Meds: acetaminophen **OR** acetaminophen, ondansetron **OR** ondansetron (ZOFRAN) IV   Allergies as of 08/11/2016 - Review Complete 08/11/2016  Allergen Reaction Noted  . Dairy aid [lactase] Swelling 07/30/2014  . Penicillins  07/30/2014  . Benadryl [diphenhydramine] Palpitations 07/30/2014    Family History  Problem  Relation Age of Onset  . Stroke Mother   . Hypertension Mother   . Hypertension Father   . Heart disease Father   . Hypertension Brother   . Breast cancer Maternal Aunt     Social History   Social History  . Marital status: Single    Spouse name: N/A  . Number of children: N/A  . Years of education: N/A   Occupational History  . Not on file.   Social History Main Topics  . Smoking status: Never Smoker  . Smokeless tobacco: Never Used  . Alcohol use No  . Drug use: No  . Sexual activity: No   Other Topics Concern  . Not on file   Social History Narrative  . No narrative on file    REVIEW OF SYSTEMS: Constitutional:  Per HPI ENT:  No nose bleeds Pulm:  No SOB or cough CV:  No palpitations, no LE edema.  GU:  No hematuria, no frequency GI:  Per HPI Heme:  Other than dark stool, no unusual bleeding or bruising   Transfusions:  Per HPI Neuro:  No headaches, no peripheral tingling or numbness Derm:  No itching, no rash or sores.  Endocrine:  No sweats or chills.  No polyuria or dysuria Immunization:  Not queried Travel:  None beyond local counties in last few months.    PHYSICAL EXAM: Vital signs in last 24 hours: Vitals:   08/12/16 0318 08/12/16 0631  BP: (!) 131/59 131/66  Pulse: 91 92  Resp: 16 18  Temp: 98.4 F (36.9 C) 98.9 F (37.2 C)   Wt Readings from Last 3 Encounters:  08/12/16 61.7 kg (136 lb 1.6 oz)  08/11/16 59 kg (130 lb)  06/25/16 62.4 kg (137 lb 8 oz)    General: pleasant, petite.  Looks well.  Head:  No swelling, trauma or asymmetry  Eyes:  No icterus or pallor Ears:  Not HOH  Nose:  No congestion or discharge Mouth:  Clear and moist oral MM Neck:  No mass, no TMG Lungs:  Clear bil.  No cough or SOB Heart: RRR.  No MRG.  S1/S2 present Abdomen:  Soft, NT, ND.  No mass, bruits, hernias, HSM.   Rectal: deferred.     Musc/Skeltl: no joint swelling or redness Extremities:  No CCE.  Feet warm and well perfused  Neurologic:  Oriented  x  3.  Good recall of details.  Fully alert.  No limb weakness or tremor. Skin:  No rash or sores, no bruises   Psych:  Pleasant, a bit anxious but not agitated.  Fully engaged and normal affect.   Intake/Output from previous day: 08/15 0701 - 08/16 0700 In: 362.5 [I.V.:312.5; IV Piggyback:50] Out: 250 [Urine:250] Intake/Output this shift: Total I/O In: 0  Out: 750 [Urine:750]  LAB RESULTS:  Recent Labs  08/11/16 1216 08/11/16 1735 08/12/16 0533  WBC 5.1 5.9  --   HGB 7.4 cL* 7.8* 8.1*  HCT 23.1 Repeated and verified X2.* 24.4* 26.8*  PLT 254.0 252  --    BMET Lab Results  Component Value Date   NA 138 08/11/2016   NA 140 06/16/2016   NA 142 01/27/2016   K 4.0 08/11/2016   K 3.8 06/16/2016   K 3.9 01/27/2016   CL 104 08/11/2016   CL 103 06/16/2016   CL 103 01/27/2016   CO2 28 08/11/2016   CO2 33 (H) 06/16/2016   CO2 33 (H) 01/27/2016   GLUCOSE 101 (H) 08/11/2016   GLUCOSE 90 06/16/2016   GLUCOSE 86 01/27/2016   BUN 19 08/11/2016   BUN 16 06/16/2016   BUN 14 01/27/2016   CREATININE 0.50 08/11/2016   CREATININE 0.65 06/16/2016   CREATININE 0.73 01/27/2016   CALCIUM 9.0 08/11/2016   CALCIUM 9.6 06/16/2016   CALCIUM 9.5 01/27/2016   LFT  Recent Labs  08/11/16 1735  PROT 6.0*  ALBUMIN 3.5  AST 20  ALT 14  ALKPHOS 40  BILITOT 0.4   PT/INR Lab Results  Component Value Date   INR 1.0 04/28/2013   Hepatitis Panel No results for input(s): HEPBSAG, HCVAB, HEPAIGM, HEPBIGM in the last 72 hours. C-Diff No components found for: CDIFF Lipase  No results found for: LIPASE  Drugs of Abuse  No results found for: LABOPIA, COCAINSCRNUR, LABBENZ, AMPHETMU, THCU, LABBARB   RADIOLOGY STUDIES: No results found.  ENDOSCOPIC STUDIES: Per HPI  IMPRESSION:   *  Recurrent anemia and GIB with melenic stools in pt with hx blood loss anemia and gi bleeds due to ulcers, possible AVMs.  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 Cameron erosions?  S/p PRBC x 1.       PLAN:     *  ? EGD a/ colonoscopy.  Will d/w Dr Myrtie Neitheranis.     Jennye MoccasinSarah Gribbin  08/12/2016, 12:25 PM Pager: [phone]    I have reviewed the entire case in detail with the above APP and discussed the plan in detail.  Therefore, I agree with the diagnoses recorded above. In addition,  I have personally interviewed and examined the patient and have personally reviewed any abdominal/pelvic CT scan images.  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 She is having subacute blood loss with more acute drop in Hgb after it had been slowly dropping for several months.  She is agreeable to an EGD today.  If negative, colonoscopy, (timing TBD)    Charlie PitterHenry L Danis III Pager [phone]  Mon-Fri 8a-5p [phone] after 5p, weekends, holidays

## 2016-08-14 ENCOUNTER — Encounter (HOSPITAL_COMMUNITY)
Admission: EM | Disposition: A | Discharge status: Home / Self Care | Payer: Self-pay | Source: Other Acute Inpatient Hospital | Attending: Internal Medicine

## 2016-08-14 ENCOUNTER — Inpatient Hospital Stay (HOSPITAL_COMMUNITY): Payer: BLUE CROSS/BLUE SHIELD | Admitting: Anesthesiology

## 2016-08-14 ENCOUNTER — Encounter (HOSPITAL_COMMUNITY): Payer: Self-pay

## 2016-08-14 HISTORY — PX: COLONOSCOPY: SHX5424

## 2016-08-14 LAB — TYPE AND SCREEN
ABO/RH(D): O POS
Antibody Screen: NEGATIVE
Unit division: 0

## 2016-08-14 LAB — CBC
HCT: 32.8 % — ABNORMAL LOW (ref 36.0–46.0)
Hemoglobin: 10.5 g/dL — ABNORMAL LOW (ref 12.0–15.0)
MCH: 29.2 pg (ref 26.0–34.0)
MCHC: 32 g/dL (ref 30.0–36.0)
MCV: 91.4 fL (ref 78.0–100.0)
PLATELETS: 243 10*3/uL (ref 150–400)
RBC: 3.59 MIL/uL — ABNORMAL LOW (ref 3.87–5.11)
RDW: 17 % — AB (ref 11.5–15.5)
WBC: 4.1 10*3/uL (ref 4.0–10.5)

## 2016-08-14 SURGERY — COLONOSCOPY
Anesthesia: Monitor Anesthesia Care

## 2016-08-14 MED ORDER — PROPOFOL 10 MG/ML IV BOLUS
INTRAVENOUS | Status: DC | PRN
  Administered 2016-08-14: 10 mg via INTRAVENOUS
  Administered 2016-08-14: 5 mg via INTRAVENOUS
  Administered 2016-08-14 (×2): 10 mg via INTRAVENOUS
  Administered 2016-08-14: 5 mg via INTRAVENOUS
  Administered 2016-08-14: 20 mg via INTRAVENOUS
  Administered 2016-08-14 (×2): 5 mg via INTRAVENOUS

## 2016-08-14 MED ORDER — PROPOFOL 500 MG/50ML IV EMUL
INTRAVENOUS | Status: DC | PRN
  Administered 2016-08-14: 75 ug/kg/min via INTRAVENOUS

## 2016-08-14 MED ORDER — PANTOPRAZOLE SODIUM 40 MG PO TBEC: 40.0000 mg | DELAYED_RELEASE_TABLET | Freq: Every day | ORAL | 1 refills | Status: DC

## 2016-08-14 NOTE — Discharge Summary (Signed)
Physician Discharge Summary  Michelle Barnett WUJ:[id number] DOB: 04-05-1961 DOA: 08/12/2016  PCP: Ruthe Mannanalia Aron, MD  Admit date: 08/12/2016 Discharge date: 08/14/2016  Time spent: 35 minutes  Recommendations for Outpatient Follow-up:  1. Please follow up on CBC on hospital follow up visit, she was admitted for GI bleed, was transfused with PRBC's during this hospitalization   Discharge Diagnoses:  Principal Problem:   GI bleed Active Problems:   Anemia, iron deficiency   Essential hypertension   Acute blood loss anemia   Melena   Discharge Condition: Stable  Diet recommendation: Regular Diet  Filed Weights   08/12/16 0213 08/14/16 0816  Weight: 61.7 kg (136 lb 1.6 oz) 61.7 kg (136 lb)    History of present illness:  The patient was in her usual state of health until late June when she was noted to be anemic on routine pre-physical lab work. Hemoglobin in January had been 13, in June was 10.6 g/dL. She was started on iron. At some point in the interim she had noticed "dark stools", foul-smelling, sometimes large, but she didn't think much of it.  Then, over the last three weeks she had increasing shortness of breath with exertion and fatigue.  Today, she had routine follow up lab work to evaluate her ferritin and hemoglobin response to iron therapy, and was noted to have ferritin 8.5 ng/mL and Hgb now 7.4 g/dL and so she was told to go to the ER.  Hospital Course:  Michelle Barnett is a 55 y/o with a past medical history of upper GI bleed, having previous hospitalizations undergoing multiple blood transfusions, status post 5 EGDs and 2 colonoscopies, reporting that she has been diagnosed with peptic ulcer disease in the past, presented with complaints of generalized weakness, fatigue, dark stools. She presented as a transfer from [facility]. Lab work revealed hemoglobin of 7.4 for which she was transfused 1 unit of packed red blood cells with hemoglobin improving to 8.1. He  reports feeling significantly better after receiving blood transfusion. GI was consulted  1.  Acute blood loss anemia -She presents with symptomatic anemia reporting generalized weakness, fatigue, poor tolerance to physical exertion as lab work revealed hemoglobin of 7.4. She reported having black tarry stools. -Status post transfusion with 1 unit of packed red blood cells with repeat hemoglobin of 8.1 -On 08/13/2016 lab work showed a drop in hemoglobin to 7.9 from 9.4 and transfused an additional unit of blood.  -Please follow up on CBC on hospital follow up visit.   2.  GI bleed -She reports having history of GI bleeds related to peptic ulcer disease -She was not anticoagulated prior to this hospitalization -On 08/13/2016 she underwent upper endoscopy that did not reveal evidence for GI bleed, esophagus was normal, cardia and gastric fundus normal, duodenum normal. She did have a large hiatal hernia. -Colonoscopy performed on 08/14/2016 was unremarkable.  -She was discharged on Protonix  3.  Hypertension -Blood pressure stable, last blood pressure 129/61 -Discharged on lisinopril/hydrochlorothiazide 10/12.5 one tablet by mouth daily    Procedures:  Colonoscopy performed on 08/15/2016                           - Decreased sphincter tone found on digital rectal                            exam.                           -  Internal hemorrhoids.                           - The examination was otherwise normal.                           - No specimens collected.                           Obscure small bowel bleeding source.  EGD performed on 08/13/2016                           - Normal esophagus.                           - Large hiatal hernia.                           - Normal examined duodenum.                           - No specimens collected.   Consultations:  GI  Discharge Exam: Vitals:   08/14/16 0955 08/14/16 1040  BP: (!) 150/69 (!) 145/75  Pulse: 79 68  Resp:  (!) 23 18  Temp:  97.6 F (36.4 C)    General: Awake and alert, follow ing commands. States feeling ready to go home Cardiovascular: RRR, nl S1S2 Respiratory: Normal inspiratory effort Abdomen: Soft, NT, ND, benign abd exam  Discharge Instructions   Discharge Instructions    Call MD for:    Complete by:  As directed   Call MD for:  difficulty breathing, headache or visual disturbances    Complete by:  As directed   Call MD for:  extreme fatigue    Complete by:  As directed   Call MD for:  hives    Complete by:  As directed   Call MD for:  persistant dizziness or light-headedness    Complete by:  As directed   Call MD for:  persistant nausea and vomiting    Complete by:  As directed   Call MD for:  redness, tenderness, or signs of infection (pain, swelling, redness, odor or green/yellow discharge around incision site)    Complete by:  As directed   Call MD for:  severe uncontrolled pain    Complete by:  As directed   Call MD for:  temperature >100.4    Complete by:  As directed   Diet - low sodium heart healthy    Complete by:  As directed   Increase activity slowly    Complete by:  As directed     Current Discharge Medication List    START taking these medications   Details  pantoprazole (PROTONIX) 40 MG tablet Take 1 tablet (40 mg total) by mouth daily at 6 (six) AM. Qty: 30 tablet, Refills: 1      CONTINUE these medications which have NOT CHANGED   Details  albuterol (VENTOLIN HFA) 108 (90 Base) MCG/ACT inhaler INHALE 2 PUFFS INTO THE LUNGS EVERY 6 (SIX) HOURS AS NEEDED FOR WHEEZING OR SHORTNESS OF BREATH. Qty: 18 Inhaler, Refills: 1    Ascorbic Acid (VITAMIN C) 1000 MG tablet Take 1,000 mg by mouth daily.    ferrous sulfate 325 (65  FE) MG tablet Take 325 mg by mouth 2 (two) times daily.     folic acid (FOLVITE) 800 MCG tablet Take 400 mcg by mouth daily.    lisinopril-hydrochlorothiazide (PRINZIDE,ZESTORETIC) 10-12.5 MG tablet Take 1 tablet by mouth daily. Qty: 90  tablet, Refills: 3    magnesium oxide (MAG-OX) 400 MG tablet Take 400 mg by mouth daily.    Multiple Vitamins-Calcium (ONE-A-DAY WOMENS PO) Take 1 tablet by mouth daily.    Omega-3 Fatty Acids (FISH OIL) 1200 MG CAPS Take 2 capsules by mouth daily.     vitamin B-12 (CYANOCOBALAMIN) 1000 MCG tablet Take 1,000 mcg by mouth daily.       Allergies  Allergen Reactions  . Dairy Aid [Lactase] Swelling and Other (See Comments)    Any dairy products  . Benadryl [Diphenhydramine] Palpitations  . Penicillins Other (See Comments)    Unknown   Follow-up Information    Charlie PitterHenry L Danis III, MD Follow up on 09/22/2016.   Specialty:  Gastroenterology Why:  4 PM follow up with GI doc Contact information: [address] [place] [address] [phone]        Ruthe Mannanalia Aron, MD Follow up in 2 week(s).   Specialty:  Family Medicine Contact information: [address] [place] [address] [phone]            The results of significant diagnostics from this hospitalization (including imaging, microbiology, ancillary and laboratory) are listed below for reference.    Significant Diagnostic Studies: [facility] Uni Left Inc Axilla  Addendum Date: 07/28/2016   ADDENDUM REPORT: 07/28/2016 12:20 ADDENDUM: The patient returns today for 3D stereotactic guided biopsy of a subtle 5 cm area of distortion in the upper central left breast seen on mammography. Additional imaging performed today, including rolled CC medial and lateral tomograms and ML tomograms, did not demonstrate any discrete area of distortion to targeted for biopsy. The extremely heterogeneous/nodular appearing parenchymal pattern appears unchanged dating back to prior mammograms from 03/04/2009. Recommendation: Six-month follow-up diagnostic mammography of the left breast. BI-RADS category: 3: Probably benign. Electronically Signed   By: Edwin CapJennifer  Jarosz M.D.   On: 07/28/2016 12:20   Result Date: 07/28/2016 CLINICAL  DATA:  55 year old patient recalled from recent screening mammogram for possible architectural distortion in the left breast. The patient has never had a surgical breast biopsy. EXAM: DIGITAL DIAGNOSTIC LEFT MAMMOGRAM WITH CAD ULTRASOUND LEFT BREAST COMPARISON:  07/08/2016 and earlier priors ACR Breast Density Category c: The breast tissue is heterogeneously dense, which may obscure small masses. FINDINGS: Focal spot compression views of the central retroareolar left breast with tomography confirm an area of subtle architectural distortion, centered in the 12 o'clock region. There is no definite associated central mass lesion. The area of distortion measures approximately 5 cm greatest diameter. Mammographic images were processed with CAD. On physical exam, I do not palpate a focal mass or thickening in the left breast. Targeted ultrasound is performed, showing a complex sonographic parenchymal pattern with alternating areas of hypoechoic shadowing tissue and with scattered interval areas of fatty breast parenchyma. This parenchymal pattern appears similar this sonographic appearance of the contralateral right side (imaged for comparison purposes), with no discrete focal asymmetric abnormality identified in the superior left breast compared to the right breast. Incidentally noted are a few cysts in the 12 o'clock region of the left breast 1 cm from the nipple. Ultrasound of the left axilla shows normal axillary lymph nodes. There is no lymphadenopathy. IMPRESSION: Persistent  subtle architectural distortion in the upper central left breast on mammography. No definite sonographic correlate is identified, with a complex parenchymal pattern identified on ultrasound bilaterally. Possible considerations include a complex sclerosing lesion, fibrocystic changes with sclerosis, or malignancy. RECOMMENDATION: Image-guided biopsy with 3D stereotactic technique with the Affirm biopsy unit is recommended. This was discussed in  detail with the patient. This procedure can be performed at the [facility] in [place], [place]. At this time, the patient states that she would like to discuss the recommendation of biopsy with a family member and she has questions to resolve about her health insurance. Typically, our staff would contact the [facility] today to initiate the process for scheduling a biopsy appointment. The patient asked that we not begin the scheduling process for her today. She was given the number of the normal [facility] to contact us if she wishes to proceed with biopsy. I have discussed the findings and recommendations with the patient. Results were also provided in writing at the conclusion of the visit. If applicable, a reminder letter will be sent to the patient regarding the next appointment. BI-RADS CATEGORY  4: Suspicious. Electronically Signed: By: Britta Mccreedy M.D. On: 07/23/2016 11:49   Mm Diag Breast Tomo Uni Left  Addendum Date: 07/28/2016   ADDENDUM REPORT: 07/28/2016 12:20 ADDENDUM: The patient returns today for 3D stereotactic guided biopsy of a subtle 5 cm area of distortion in the upper central left breast seen on mammography. Additional imaging performed today, including rolled CC medial and lateral tomograms and ML tomograms, did not demonstrate any discrete area of distortion to targeted for biopsy. The extremely heterogeneous/nodular appearing parenchymal pattern appears unchanged dating back to prior mammograms from 03/04/2009. Recommendation: Six-month follow-up diagnostic mammography of the left breast. BI-RADS category: 3: Probably benign. Electronically Signed   By: Edwin Cap M.D.   On: 07/28/2016 12:20   Result Date: 07/28/2016 CLINICAL DATA:  55 year old patient recalled from recent screening mammogram for possible architectural distortion in the left breast. The patient has never had a surgical breast biopsy. EXAM: DIGITAL  DIAGNOSTIC LEFT MAMMOGRAM WITH CAD ULTRASOUND LEFT BREAST COMPARISON:  07/08/2016 and earlier priors ACR Breast Density Category c: The breast tissue is heterogeneously dense, which may obscure small masses. FINDINGS: Focal spot compression views of the central retroareolar left breast with tomography confirm an area of subtle architectural distortion, centered in the 12 o'clock region. There is no definite associated central mass lesion. The area of distortion measures approximately 5 cm greatest diameter. Mammographic images were processed with CAD. On physical exam, I do not palpate a focal mass or thickening in the left breast. Targeted ultrasound is performed, showing a complex sonographic parenchymal pattern with alternating areas of hypoechoic shadowing tissue and with scattered interval areas of fatty breast parenchyma. This parenchymal pattern appears similar this sonographic appearance of the contralateral right side (imaged for comparison purposes), with no discrete focal asymmetric abnormality identified in the superior left breast compared to the right breast. Incidentally noted are a few cysts in the 12 o'clock region of the left breast 1 cm from the nipple. Ultrasound of the left axilla shows normal axillary lymph nodes. There is no lymphadenopathy. IMPRESSION: Persistent subtle architectural distortion in the upper central left breast on mammography. No definite sonographic correlate is identified, with a complex parenchymal pattern identified on ultrasound bilaterally. Possible considerations include a complex sclerosing lesion, fibrocystic changes with sclerosis, or malignancy. RECOMMENDATION: Image-guided biopsy with 3D  stereotactic technique with the Affirm biopsy unit is recommended. This was discussed in detail with the patient. This procedure can be performed at the [facility] in [place], [place]. At this time, the patient states that she would like to  discuss the recommendation of biopsy with a family member and she has questions to resolve about her health insurance. Typically, our staff would contact the [facility] today to initiate the process for scheduling a biopsy appointment. The patient asked that we not begin the scheduling process for her today. She was given the number of the normal [facility] to contact us if she wishes to proceed with biopsy. I have discussed the findings and recommendations with the patient. Results were also provided in writing at the conclusion of the visit. If applicable, a reminder letter will be sent to the patient regarding the next appointment. BI-RADS CATEGORY  4: Suspicious. Electronically Signed: By: Britta Mccreedy M.D. On: 07/23/2016 11:49    Microbiology: No results found for this or any previous visit (from the past 240 hour(s)).   Labs: Basic Metabolic Panel:  Recent Labs Lab 08/11/16 1735 08/13/16 0534  NA 138 141  K 4.0 3.9  CL 104 106  CO2 28 25  GLUCOSE 101* 88  BUN 19 <5*  CREATININE 0.50 0.64  CALCIUM 9.0 8.7*   Liver Function Tests:  Recent Labs Lab 08/11/16 1735  AST 20  ALT 14  ALKPHOS 40  BILITOT 0.4  PROT 6.0*  ALBUMIN 3.5   No results for input(s): LIPASE, AMYLASE in the last 168 hours. No results for input(s): AMMONIA in the last 168 hours. CBC:  Recent Labs Lab 08/11/16 1216 08/11/16 1735 08/12/16 0533 08/12/16 0816 08/12/16 1651 08/13/16 0534  WBC 5.1 5.9  --   --  6.6 4.0  NEUTROABS 3.7  --   --   --   --   --   HGB 7.4 cL* 7.8* 8.1*  --  9.4* 7.9*  HCT 23.1 Repeated and verified X2.* 24.4* 26.8* 26.7* 30.8* 26.4*  MCV 90.4 91.2  --   --  95.1 95.7  PLT 254.0 252  --   --  239 207   Cardiac Enzymes: No results for input(s): CKTOTAL, CKMB, CKMBINDEX, TROPONINI in the last 168 hours. BNP: BNP (last 3 results) No results for input(s): BNP in the last 8760 hours.  ProBNP (last 3 results) No results for input(s):  PROBNP in the last 8760 hours.  CBG: No results for input(s): GLUCAP in the last 168 hours.     Signed:  Jeralyn Bennett MD.  [facility] 08/14/2016, 11:20 AM

## 2016-08-14 NOTE — Interval H&P Note (Signed)
History and Physical Interval Note:  08/14/2016 8:45 AM  Michelle Barnett  has presented today for surgery, with the diagnosis of gi bleed, anemia  The various methods of treatment have been discussed with the patient and family. After consideration of risks, benefits and other options for treatment, the patient has consented to  Procedure(s): COLONOSCOPY (N/A) as a surgical intervention .  The patient's history has been reviewed, patient examined, no change in status, stable for surgery.  I have reviewed the patient's chart and labs.  Questions were answered to the patient's satisfaction.     Charlie PitterHenry L Danis III

## 2016-08-14 NOTE — Op Note (Signed)
[facility] Patient Name: Michelle Barnett Procedure Date : 08/14/2016 MRN: [id number] Attending MD: Starr Lake. Myrtie Neither , MD Date of Birth: Jul 25, 1961 CSN: [id number] Age: 55 Admit Type: Inpatient Procedure:                Colonoscopy Indications:              Melena, Acute post hemorrhagic anemia Providers:                Sherilyn Cooter L. Myrtie Neither, MD, Tomma Rakers, RN, Kandice Robinsons, Technician Referring MD:              Medicines:                Monitored Anesthesia Care Complications:            No immediate complications. Estimated Blood Loss:     Estimated blood loss: none. Procedure:                Pre-Anesthesia Assessment:                           - Prior to the procedure, a History and Physical                            was performed, and patient medications and                            allergies were reviewed. The patient's tolerance of                            previous anesthesia was also reviewed. The risks                            and benefits of the procedure and the sedation                            options and risks were discussed with the patient.                            All questions were answered, and informed consent                            was obtained. Prior Anticoagulants: The patient has                            taken no previous anticoagulant or antiplatelet                            agents. ASA Grade Assessment: II - A patient with                            mild systemic disease. After reviewing the risks  and benefits, the patient was deemed in                            satisfactory condition to undergo the procedure.                           After obtaining informed consent, the colonoscope                            was passed under direct vision. Throughout the                            procedure, the patient's blood pressure, pulse, and                            oxygen  saturations were monitored continuously. The                            [id number] ([id number] scope was introduced through                            the anus and advanced to the the cecum, identified                            by appendiceal orifice and ileocecal valve. The                            quality of the bowel preparation was good. The                            ileocecal valve, appendiceal orifice, and rectum                            were photographed. The bowel preparation used was                            MoviPrep. The quality of the bowel preparation was                            evaluated using the BBPS [facility]([place] Bowel Preparation                            Scale) with scores of: Right Colon = 2, Transverse                            Colon = 2 and Left Colon = 2. The total BBPS score                            equals 6. The colonoscopy was performed with                            moderate difficulty due to significant looping.  Successful completion of the procedure was aided by                            changing the patient to a supine position. Scope In: 9:05:50 AM Scope Out: 9:28:30 AM Scope Withdrawal Time: 0 hours 7 minutes 19 seconds  Total Procedure Duration: 0 hours 22 minutes 40 seconds  Findings:      The digital rectal exam findings include decreased sphincter tone.      Retroflexion in the rectum was not performed due to anatomy.      The exam was otherwise without abnormality. Impression:               - Decreased sphincter tone found on digital rectal                            exam.                           - Internal hemorrhoids.                           - The examination was otherwise normal.                           - No specimens collected.                           Obscure small bowel bleeding source. Moderate Sedation:      MAC sedation used Recommendation:           - Resume regular diet.                            - Discharge patient to home.                           - Return to my office at the next available                            appointment. Patient will then need small bowel                            Xray series because of prior surgery and                            consideration of video capsule study.                           - Post procedure medication orders were given.                           - Repeat colonoscopy in 10 years for screening                            purposes. Procedure Code(s):        --- Professional ---  40981, Colonoscopy, flexible; diagnostic, including                            collection of specimen(s) by brushing or washing,                            when performed (separate procedure) Diagnosis Code(s):        --- Professional ---                           K62.89, Other specified diseases of anus and rectum                           K64.0, First degree hemorrhoids                           K92.1, Melena (includes Hematochezia)                           D62, Acute posthemorrhagic anemia CPT copyright 2016 American Medical Association. All rights reserved. The codes documented in this report are preliminary and upon coder review may  be revised to meet current compliance requirements. Henry L. Myrtie Neither, MD 08/14/2016 9:39:43 AM This report has been signed electronically. Number of Addenda: 0

## 2016-08-14 NOTE — Transfer of Care (Signed)
Immediate Anesthesia Transfer of Care Note  Patient: Michelle Barnett  Procedure(s) Performed: Procedure(s): COLONOSCOPY (N/A)  Patient Location: Endoscopy Unit  Anesthesia Type:MAC  Level of Consciousness: awake, alert  and oriented  Airway & Oxygen Therapy: Patient Spontanous Breathing and Patient connected to face mask oxygen  Post-op Assessment: Report given to RN, Post -op Vital signs reviewed and stable and Patient moving all extremities  Post vital signs: Reviewed and stable  Last Vitals:  Vitals:   08/14/16 0532 08/14/16 0816  BP: 109/64 140/78  Pulse: 68 85  Resp: 18 (!) 23  Temp: 36.9 C 36.4 C    Last Pain:  Vitals:   08/14/16 0816  TempSrc: Oral         Complications: No apparent anesthesia complications

## 2016-08-14 NOTE — Anesthesia Preprocedure Evaluation (Signed)
Anesthesia Evaluation  Patient identified by MRN, date of birth, ID band Patient awake    Reviewed: Allergy & Precautions, H&P , NPO status , Patient's Chart, lab work & pertinent test results  History of Anesthesia Complications Negative for: history of anesthetic complications  Airway Mallampati: II  TM Distance: >3 FB Neck ROM: full    Dental no notable dental hx.    Pulmonary COPD,    Pulmonary exam normal breath sounds clear to auscultation       Cardiovascular hypertension, Pt. on medications Normal cardiovascular exam Rhythm:regular Rate:Normal     Neuro/Psych PSYCHIATRIC DISORDERS negative neurological ROS     GI/Hepatic Neg liver ROS, hiatal hernia,   Endo/Other  negative endocrine ROS  Renal/GU negative Renal ROS     Musculoskeletal   Abdominal   Peds  Hematology  (+) anemia ,   Anesthesia Other Findings   Reproductive/Obstetrics negative OB ROS                             Anesthesia Physical Anesthesia Plan  ASA: III  Anesthesia Plan: MAC   Post-op Pain Management:    Induction: Intravenous  Airway Management Planned: Simple Face Mask  Additional Equipment:   Intra-op Plan:   Post-operative Plan:   Informed Consent: I have reviewed the patients History and Physical, chart, labs and discussed the procedure including the risks, benefits and alternatives for the proposed anesthesia with the patient or authorized representative who has indicated his/her understanding and acceptance.   Dental Advisory Given  Plan Discussed with: Anesthesiologist, CRNA and Surgeon  Anesthesia Plan Comments:         Anesthesia Quick Evaluation

## 2016-08-14 NOTE — Care Management Note (Signed)
Case Management Note  Patient Details  Name: Michelle GuppyCathy F Lavey MRN: [id number] Date of Birth: 1961-06-01  Subjective/Objective:    Admitted with GI bleed.                Action/Plan: Plan is to d/c to home today assuming care for self. Benefits check : Protonix 40mg  once a day.Pt cost will be $ 3.95 for a 30 day supply per pharmacy tech @ [facility] ([place]),[phone]. CM made pt aware.  Expected Discharge Date:    08/14/2016              Expected Discharge Plan:  Home/Self Care  In-House Referral:     Discharge planning Services  CM Consult    Status of Service:  Completed, signed off  If discussed at Long Length of Stay Meetings, dates discussed:    Additional Comments:  Epifanio LeschesCole, Chace Bisch Hudson, RN 08/14/2016, 11:39 AM

## 2016-08-14 NOTE — Progress Notes (Signed)
Nsg Discharge Note  Admit Date:  08/12/2016 Discharge date: 08/14/2016   Michelle Barnett to be D/C'd Home per MD order.  AVS completed.  Copy for chart, and copy for patient signed, and dated. Patient/caregiver able to verbalize understanding.  Discharge Medication:   Medication List    TAKE these medications   albuterol 108 (90 Base) MCG/ACT inhaler Commonly known as:  VENTOLIN HFA INHALE 2 PUFFS INTO THE LUNGS EVERY 6 (SIX) HOURS AS NEEDED FOR WHEEZING OR SHORTNESS OF BREATH.   ferrous sulfate 325 (65 FE) MG tablet Take 325 mg by mouth 2 (two) times daily.   Fish Oil 1200 MG Caps Take 2 capsules by mouth daily.   folic acid 800 MCG tablet Commonly known as:  FOLVITE Take 400 mcg by mouth daily.   lisinopril-hydrochlorothiazide 10-12.5 MG tablet Commonly known as:  PRINZIDE,ZESTORETIC Take 1 tablet by mouth daily.   magnesium oxide 400 MG tablet Commonly known as:  MAG-OX Take 400 mg by mouth daily.   ONE-A-DAY WOMENS PO Take 1 tablet by mouth daily.   pantoprazole 40 MG tablet Commonly known as:  PROTONIX Take 1 tablet (40 mg total) by mouth daily at 6 (six) AM.   vitamin B-12 1000 MCG tablet Commonly known as:  CYANOCOBALAMIN Take 1,000 mcg by mouth daily.   vitamin C 1000 MG tablet Take 1,000 mg by mouth daily.       Discharge Assessment: Vitals:   08/14/16 0955 08/14/16 1040  BP: (!) 150/69 (!) 145/75  Pulse: 79 68  Resp: (!) 23 18  Temp:  97.6 F (36.4 C)   Skin clean, dry and intact without evidence of skin break down, no evidence of skin tears noted. IV catheter discontinued intact. Site without signs and symptoms of complications - no redness or edema noted at insertion site, patient denies c/o pain - only slight tenderness at site.  Dressing with slight pressure applied.  D/c Instructions-Education: Discharge instructions given to patient/family with verbalized understanding. D/c education completed with patient/family including follow up  instructions, medication list, d/c activities limitations if indicated, with other d/c instructions as indicated by MD - patient able to verbalize understanding, all questions fully answered. Patient instructed to return to ED, call 911, or call MD for any changes in condition.  Patient escorted via WC, and D/C home via private auto.  Kavir Savoca Consuella Loselaine, RN 08/14/2016 1:30 PM

## 2016-08-14 NOTE — Anesthesia Postprocedure Evaluation (Signed)
Anesthesia Post Note  Patient: Michelle Barnett  Procedure(s) Performed: Procedure(s) (LRB): COLONOSCOPY (N/A)  Patient location during evaluation: PACU Anesthesia Type: MAC Level of consciousness: awake and alert Pain management: pain level controlled Vital Signs Assessment: post-procedure vital signs reviewed and stable Respiratory status: spontaneous breathing, nonlabored ventilation, respiratory function stable and patient connected to nasal cannula oxygen Cardiovascular status: stable and blood pressure returned to baseline Anesthetic complications: no    Last Vitals:  Vitals:   08/14/16 0950 08/14/16 0955  BP:  (!) 150/69  Pulse: 80 79  Resp: 17 (!) 23  Temp:      Last Pain:  Vitals:   08/14/16 0935  TempSrc: Axillary                 Reino KentJudd, Nathaneal Sommers J

## 2016-08-15 IMAGING — US US BREAST*L* LIMITED INC AXILLA
1 series · 4 of 4 positions shown · non-contrast
Comparison: 07/08/2016 and earlier priors

ADDENDUM:
The patient returns today for 3D stereotactic guided biopsy of a
subtle 5 cm area of distortion in the upper central left breast seen
on mammography. Additional imaging performed today, including rolled
CC medial and lateral tomograms and ML tomograms, did not
demonstrate any discrete area of distortion to targeted for biopsy.
The extremely heterogeneous/nodular appearing parenchymal pattern
appears unchanged dating back to prior mammograms from 03/04/2009.

Recommendation: Six-month follow-up diagnostic mammography of the
left breast.
BI-RADS category: 3: Probably benign.
CLINICAL DATA: 55-year-old patient recalled from recent screening
mammogram for possible architectural distortion in the left breast.
The patient has never had a surgical breast biopsy.
EXAM:
DIGITAL DIAGNOSTIC LEFT MAMMOGRAM WITH CAD
ULTRASOUND LEFT BREAST

[Series 1: us breast*left* limited inc axilla · 0.07mm/px · 4 of 4 slices shown]
[im 1/4]
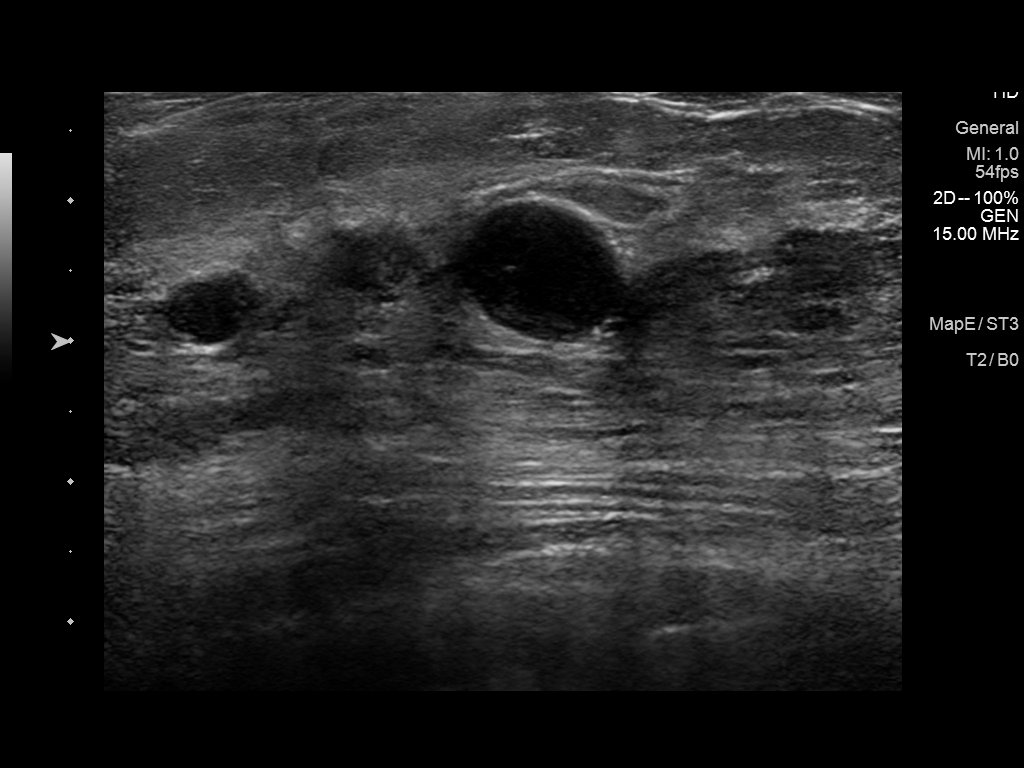
[im 2/4]
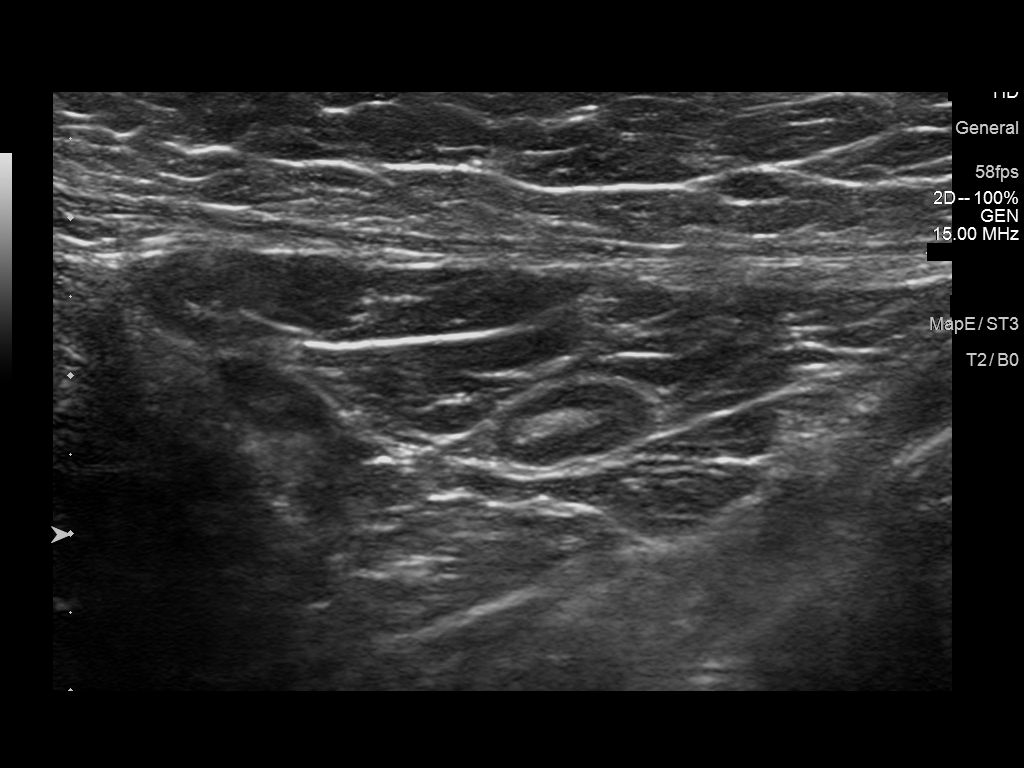
[im 3/4]
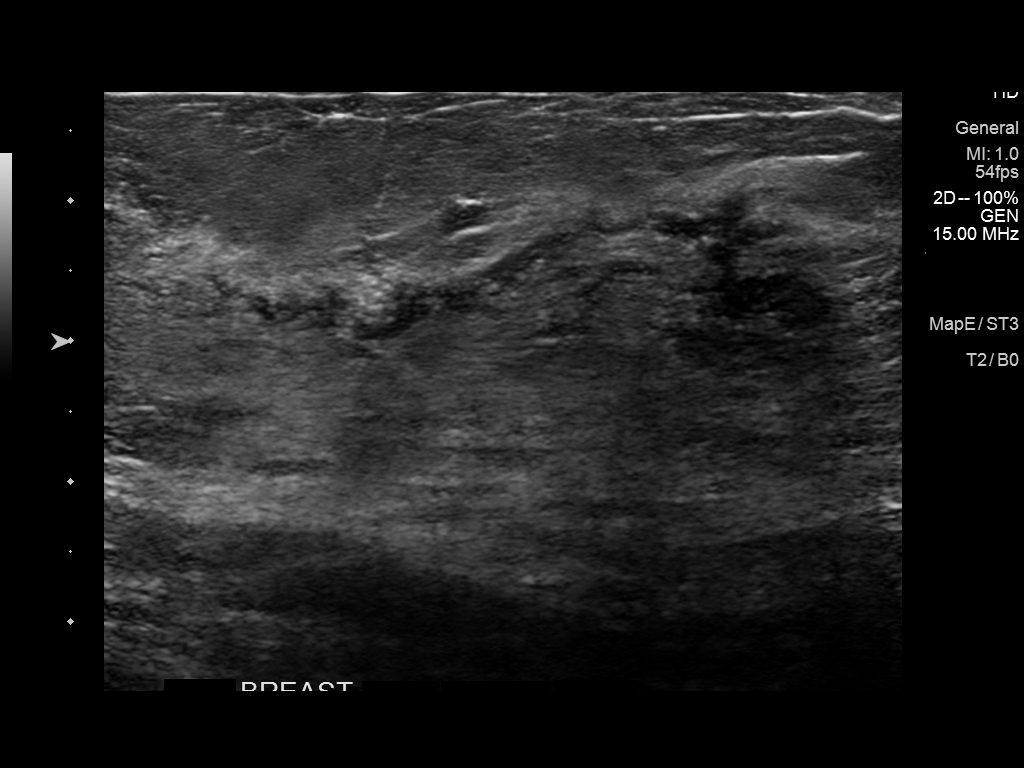
[im 4/4]
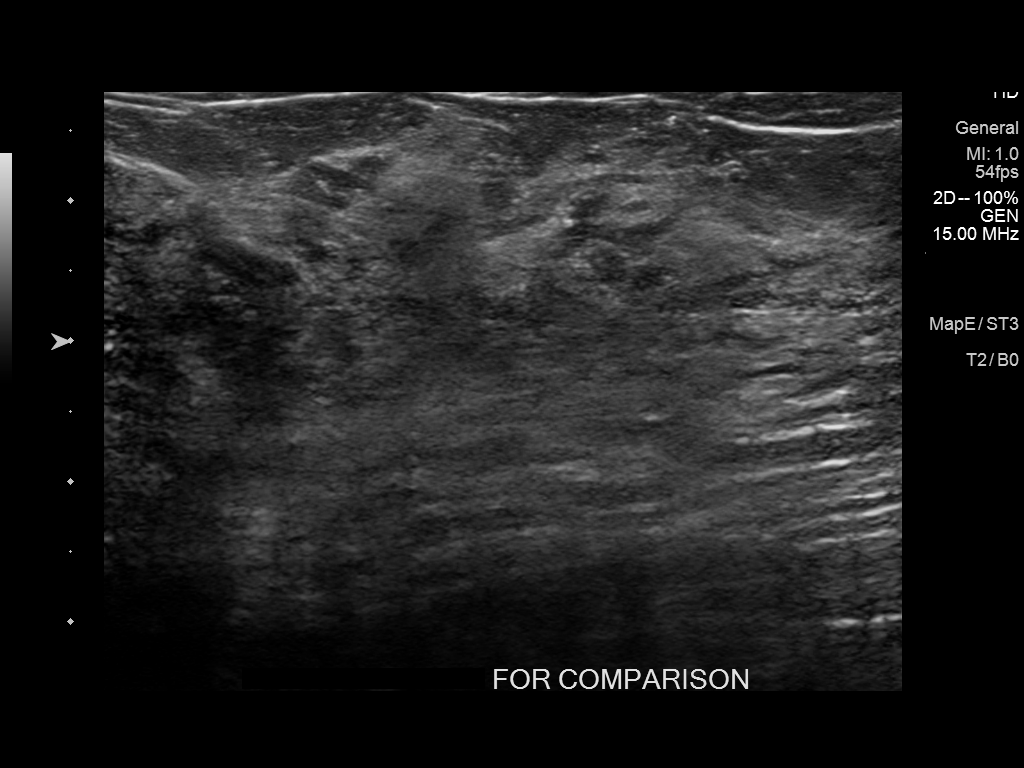

[4 of 4 positions shown; findings below may reference images not displayed]

ACR Breast Density Category c: The breast tissue is heterogeneously
dense, which may obscure small masses.
FINDINGS: Focal spot compression views of the central retroareolar left breast
with tomography confirm an area of subtle architectural distortion,
centered in the 12 o'clock region. There is no definite associated
central mass lesion. The area of distortion measures approximately 5
cm greatest diameter.

Mammographic images were processed with CAD.

On physical exam, I do not palpate a focal mass or thickening in the
left breast.

Targeted ultrasound is performed, showing a complex sonographic
parenchymal pattern with alternating areas of hypoechoic shadowing
tissue and with scattered interval areas of fatty breast parenchyma.
This parenchymal pattern appears similar this sonographic appearance
of the contralateral right side (imaged for comparison purposes),
with no discrete focal asymmetric abnormality identified in the
superior left breast compared to the right breast. Incidentally
noted are a few cysts in the 12 o'clock region of the left breast 1
cm from the nipple.

Ultrasound of the left axilla shows normal axillary lymph nodes.
There is no lymphadenopathy.
IMPRESSION: Persistent subtle architectural distortion in the upper central left
breast on mammography. No definite sonographic correlate is
identified, with a complex parenchymal pattern identified on
ultrasound bilaterally. Possible considerations include a complex
sclerosing lesion, fibrocystic changes with sclerosis, or
malignancy.

RECOMMENDATION:
Image-guided biopsy with 3D stereotactic technique with the [facility]
biopsy unit is recommended. This was discussed in detail with the
patient. This procedure can be performed at the [REDACTED] in [HOSPITAL], [place] [facility]HOSPITAL]. At this time, the
patient states that she would like to discuss the recommendation of
biopsy with a family member and she has questions to resolve about
her health insurance. Typically, our staff would contact the [REDACTED] today to initiate the process for
scheduling a biopsy appointment. The patient asked that we not begin
the scheduling process for her today. She was given the number of
the [facility] to contact us if she wishes to proceed
with biopsy.

I have discussed the findings and recommendations with the patient.
Results were also provided in writing at the conclusion of the
visit. If applicable, a reminder letter will be sent to the patient
regarding the next appointment.

BI-RADS CATEGORY  4: Suspicious.

## 2016-08-16 ENCOUNTER — Encounter (HOSPITAL_COMMUNITY): Payer: Self-pay | Admitting: Gastroenterology

## 2016-08-19 ENCOUNTER — Other Ambulatory Visit: Payer: Managed Care, Other (non HMO)

## 2016-09-16 ENCOUNTER — Encounter: Payer: Self-pay | Admitting: Family Medicine

## 2016-09-16 ENCOUNTER — Ambulatory Visit (INDEPENDENT_AMBULATORY_CARE_PROVIDER_SITE_OTHER): Payer: Managed Care, Other (non HMO) | Admitting: Family Medicine

## 2016-09-16 VITALS — BP 150/90 | HR 104 | Temp 98.7°F | Wt 132.0 lb

## 2016-09-16 DIAGNOSIS — J069 Acute upper respiratory infection, unspecified: Secondary | ICD-10-CM

## 2016-09-16 DIAGNOSIS — R062 Wheezing: Secondary | ICD-10-CM | POA: Diagnosis not present

## 2016-09-16 MED ORDER — PREDNISONE 20 MG PO TABS: ORAL_TABLET | ORAL | 0 refills | Status: DC

## 2016-09-16 MED ORDER — HYDROCOD POLST-CPM POLST ER 10-8 MG/5ML PO SUER: 5.0000 mL | Freq: Two times a day (BID) | ORAL | 0 refills | Status: DC | PRN

## 2016-09-16 MED ORDER — AZITHROMYCIN 250 MG PO TABS: ORAL_TABLET | ORAL | 0 refills | Status: DC

## 2016-09-16 MED ORDER — IPRATROPIUM BROMIDE 0.02 % IN SOLN
0.5000 mg | Freq: Once | RESPIRATORY_TRACT | Status: AC
  Administered 2016-09-16: 0.5 mg via RESPIRATORY_TRACT

## 2016-09-16 MED ORDER — ALBUTEROL SULFATE (2.5 MG/3ML) 0.083% IN NEBU
2.5000 mg | INHALATION_SOLUTION | Freq: Once | RESPIRATORY_TRACT | Status: AC
  Administered 2016-09-16: 2.5 mg via RESPIRATORY_TRACT

## 2016-09-16 NOTE — Progress Notes (Signed)
Subjective:    Patient ID: Michelle Barnett, female    DOB: October 07, 1961, 55 y.o.   MRN: [id number]  HPI This is a 55 yo female who presents today with nasal congestion, cough productive of thick, white sputum, feeling hot, wheezing for 2 weeks. Some nasal congestion. Has been using inhaler frequently with some temporary relief. Has tried Robitussin, Vicks, Mucinex with little relief. Chest sore from coughing. Was feeling better at beginning of week then starting feeling worse yesterday and this morning. Had to call out sick from work today. Co workers sick. No smoking history.   Past Medical History:  Diagnosis Date  . Bronchitis   . Heart murmur   . Hiatal hernia   . History of blood transfusion   . Hypertension   . Ulcer    Past Surgical History:  Procedure Laterality Date  . abdominal tumor    . ABLATION    . COLONOSCOPY N/A 08/14/2016   Procedure: COLONOSCOPY;  Surgeon: Sherrilyn RistHenry L Danis III, MD;  [facility];  Service: Endoscopy;  Laterality: N/A;  . ESOPHAGOGASTRODUODENOSCOPY N/A 08/13/2016   Procedure: ESOPHAGOGASTRODUODENOSCOPY (EGD);  Surgeon: Sherrilyn RistHenry L Danis III, MD;  [facility];  Service: Gastroenterology;  Laterality: N/A;  . TOOTH EXTRACTION     Family History  Problem Relation Age of Onset  . Stroke Mother   . Hypertension Mother   . Hypertension Father   . Heart disease Father   . Hypertension Brother   . Breast cancer Maternal Aunt    Social History  Substance Use Topics  . Smoking status: Never Smoker  . Smokeless tobacco: Never Used  . Alcohol use No       Review of Systems Per HPI    Objective:   Physical Exam  Constitutional: She is oriented to person, place, and time. She appears well-developed and well-nourished. No distress.  HENT:  Head: Normocephalic and atraumatic.  Right Ear: External ear normal.  Left Ear: External ear normal.  Nose: Nose normal.  Mouth/Throat: Oropharynx is clear and moist. No oropharyngeal exudate.    Eyes: Conjunctivae are normal.  Neck: Normal range of motion. Neck supple.  Cardiovascular: Normal rate and regular rhythm.   Murmur heard. Pulmonary/Chest: Effort normal. She has wheezes (scattered expiratory wheezes posteriorly).  Frequent dry cough   Musculoskeletal: Normal range of motion.  Lymphadenopathy:    She has no cervical adenopathy.  Neurological: She is alert and oriented to person, place, and time.  Skin: Skin is warm and dry. She is not diaphoretic.  Psychiatric: She has a normal mood and affect. Her behavior is normal. Judgment and thought content normal.  Vitals reviewed.     BP (!) 150/90   Temp 98.7 F (37.1 C)   Wt 132 lb (59.9 kg)   BMI 29.59 kg/m  Wt Readings from Last 3 Encounters:  09/16/16 132 lb (59.9 kg)  08/14/16 136 lb (61.7 kg)  08/11/16 130 lb (59 kg)   Albuterol/atrovent nebulizer treatment given in office with subjective improvement and clearing of wheezes.     Assessment & Plan:  1. Upper respiratory infection with cough and congestion - Provided written and verbal information regarding diagnosis and treatment. - RTC precautions reviewed - albuterol (PROVENTIL) (2.5 MG/3ML) 0.083% nebulizer solution 2.5 mg; Take 3 mLs (2.5 mg total) by nebulization once. - ipratropium (ATROVENT) nebulizer solution 0.5 mg; Take 2.5 mLs (0.5 mg total) by nebulization once. - azithromycin (ZITHROMAX) 250 MG tablet; Take two tablets today then one a day  until finished  Dispense: 6 tablet; Refill: 0 - predniSONE (DELTASONE) 20 MG tablet; Take 3 tablets x 3 days, then 2 tablets x 3 days, then 1 tablet x 3 days.  Dispense: 18 tablet; Refill: 0 - chlorpheniramine-HYDROcodone (TUSSIONEX PENNKINETIC ER) 10-8 MG/5ML SUER; Take 5 mLs by mouth every 12 (twelve) hours as needed for cough.  Dispense: 115 mL; Refill: 0  2. Wheeze - albuterol (PROVENTIL) (2.5 MG/3ML) 0.083% nebulizer solution 2.5 mg; Take 3 mLs (2.5 mg total) by nebulization once. - ipratropium (ATROVENT)  nebulizer solution 0.5 mg; Take 2.5 mLs (0.5 mg total) by nebulization once. - predniSONE (DELTASONE) 20 MG tablet; Take 3 tablets x 3 days, then 2 tablets x 3 days, then 1 tablet x 3 days.  Dispense: 18 tablet; Refill: 0  - follow up with Dr. Dayton Martes for chronic conditions  Olean Ree, FNP-BC  [facility], [facility]  09/16/2016 9:10 AM

## 2016-09-16 NOTE — Patient Instructions (Signed)
Please drink enough liquids to make your urine light yellow If you have shortness of breath, fever over 101, severe headache please go to the emergency room If you are not better in 24 hours, please let us know  Upper Respiratory Infection, Adult Most upper respiratory infections (URIs) are a viral infection of the air passages leading to the lungs. A URI affects the nose, throat, and upper air passages. The most common type of URI is nasopharyngitis and is typically referred to as "the common cold." URIs run their course and usually go away on their own. Most of the time, a URI does not require medical attention, but sometimes a bacterial infection in the upper airways can follow a viral infection. This is called a secondary infection. Sinus and middle ear infections are common types of secondary upper respiratory infections. Bacterial pneumonia can also complicate a URI. A URI can worsen asthma and chronic obstructive pulmonary disease (COPD). Sometimes, these complications can require emergency medical care and may be life threatening.  CAUSES Almost all URIs are caused by viruses. A virus is a type of germ and can spread from one person to another.  RISKS FACTORS You may be at risk for a URI if:   You smoke.   You have chronic heart or lung disease.  You have a weakened defense (immune) system.   You are very young or very old.   You have nasal allergies or asthma.  You work in crowded or poorly ventilated areas.  You work in health care facilities or schools. SIGNS AND SYMPTOMS  Symptoms typically develop 2-3 days after you come in contact with a cold virus. Most viral URIs last 7-10 days. However, viral URIs from the influenza virus (flu virus) can last 14-18 days and are typically more severe. Symptoms may include:   Runny or stuffy (congested) nose.   Sneezing.   Cough.   Sore throat.   Headache.   Fatigue.   Fever.   Loss of appetite.   Pain in your  forehead, behind your eyes, and over your cheekbones (sinus pain).  Muscle aches.  DIAGNOSIS  Your health care provider may diagnose a URI by:  Physical exam.  Tests to check that your symptoms are not due to another condition such as:  Strep throat.  Sinusitis.  Pneumonia.  Asthma. TREATMENT  A URI goes away on its own with time. It cannot be cured with medicines, but medicines may be prescribed or recommended to relieve symptoms. Medicines may help:  Reduce your fever.  Reduce your cough.  Relieve nasal congestion. HOME CARE INSTRUCTIONS   Take medicines only as directed by your health care provider.   Gargle warm saltwater or take cough drops to comfort your throat as directed by your health care provider.  Use a warm mist humidifier or inhale steam from a shower to increase air moisture. This may make it easier to breathe.  Drink enough fluid to keep your urine clear or pale yellow.   Eat soups and other clear broths and maintain good nutrition.   Rest as needed.   Return to work when your temperature has returned to normal or as your health care provider advises. You may need to stay home longer to avoid infecting others. You can also use a face mask and careful hand washing to prevent spread of the virus.  Increase the usage of your inhaler if you have asthma.   Do not use any tobacco products, including cigarettes, chewing tobacco, or  electronic cigarettes. If you need help quitting, ask your health care provider. PREVENTION  The best way to protect yourself from getting a cold is to practice good hygiene.   Avoid oral or hand contact with people with cold symptoms.   Wash your hands often if contact occurs.  There is no clear evidence that vitamin C, vitamin E, echinacea, or exercise reduces the chance of developing a cold. However, it is always recommended to get plenty of rest, exercise, and practice good nutrition.  SEEK MEDICAL CARE IF:   You  are getting worse rather than better.   Your symptoms are not controlled by medicine.   You have chills.  You have worsening shortness of breath.  You have brown or red mucus.  You have yellow or brown nasal discharge.  You have pain in your face, especially when you bend forward.  You have a fever.  You have swollen neck glands.  You have pain while swallowing.  You have white areas in the back of your throat. SEEK IMMEDIATE MEDICAL CARE IF:   You have severe or persistent:  Headache.  Ear pain.  Sinus pain.  Chest pain.  You have chronic lung disease and any of the following:  Wheezing.  Prolonged cough.  Coughing up blood.  A change in your usual mucus.  You have a stiff neck.  You have changes in your:  Vision.  Hearing.  Thinking.  Mood. MAKE SURE YOU:   Understand these instructions.  Will watch your condition.  Will get help right away if you are not doing well or get worse.   This information is not intended to replace advice given to you by your health care provider. Make sure you discuss any questions you have with your health care provider.   Document Released: 06/09/2001 Document Revised: 04/30/2015 Document Reviewed: 03/21/2014 Elsevier Interactive Patient Education [employer].

## 2016-09-18 ENCOUNTER — Telehealth: Payer: Self-pay

## 2016-09-18 NOTE — Telephone Encounter (Signed)
Pt left /vm; pt was seen 09/16/16; pt wants to know if she could get order for a breathing machine at home;nebulizer?. Pt is not sure if her insurance will pay for it. Left v/m for pt to check with her insurance to see if nebulizer would be covered and then pt is to cb with that info.

## 2016-09-19 ENCOUNTER — Inpatient Hospital Stay (HOSPITAL_COMMUNITY)
Admit: 2016-09-19 | Discharge: 2016-09-19 | Disposition: A | Discharge status: Home / Self Care | Payer: Managed Care, Other (non HMO) | Attending: Internal Medicine | Admitting: Internal Medicine

## 2016-09-19 ENCOUNTER — Inpatient Hospital Stay
Admission: EM | Admit: 2016-09-19 | Discharge: 2016-09-22 | DRG: 280 | Disposition: A | Discharge status: Home / Self Care | Payer: Managed Care, Other (non HMO) | Attending: Internal Medicine | Admitting: Internal Medicine

## 2016-09-19 ENCOUNTER — Encounter: Payer: Self-pay | Admitting: Urgent Care

## 2016-09-19 ENCOUNTER — Emergency Department: Payer: Managed Care, Other (non HMO)

## 2016-09-19 DIAGNOSIS — Q231 Congenital insufficiency of aortic valve: Secondary | ICD-10-CM | POA: Diagnosis not present

## 2016-09-19 DIAGNOSIS — J9601 Acute respiratory failure with hypoxia: Secondary | ICD-10-CM | POA: Diagnosis present

## 2016-09-19 DIAGNOSIS — Z79899 Other long term (current) drug therapy: Secondary | ICD-10-CM | POA: Diagnosis not present

## 2016-09-19 DIAGNOSIS — Z91011 Allergy to milk products: Secondary | ICD-10-CM

## 2016-09-19 DIAGNOSIS — Z880 Allergy status to penicillin: Secondary | ICD-10-CM

## 2016-09-19 DIAGNOSIS — Z823 Family history of stroke: Secondary | ICD-10-CM | POA: Diagnosis not present

## 2016-09-19 DIAGNOSIS — K219 Gastro-esophageal reflux disease without esophagitis: Secondary | ICD-10-CM | POA: Diagnosis present

## 2016-09-19 DIAGNOSIS — J441 Chronic obstructive pulmonary disease with (acute) exacerbation: Secondary | ICD-10-CM

## 2016-09-19 DIAGNOSIS — Z8249 Family history of ischemic heart disease and other diseases of the circulatory system: Secondary | ICD-10-CM | POA: Diagnosis not present

## 2016-09-19 DIAGNOSIS — Z87892 Personal history of anaphylaxis: Secondary | ICD-10-CM | POA: Diagnosis not present

## 2016-09-19 DIAGNOSIS — J40 Bronchitis, not specified as acute or chronic: Secondary | ICD-10-CM | POA: Diagnosis present

## 2016-09-19 DIAGNOSIS — Z888 Allergy status to other drugs, medicaments and biological substances status: Secondary | ICD-10-CM | POA: Diagnosis not present

## 2016-09-19 DIAGNOSIS — R0902 Hypoxemia: Secondary | ICD-10-CM

## 2016-09-19 DIAGNOSIS — I214 Non-ST elevation (NSTEMI) myocardial infarction: Secondary | ICD-10-CM | POA: Diagnosis not present

## 2016-09-19 DIAGNOSIS — R079 Chest pain, unspecified: Secondary | ICD-10-CM | POA: Diagnosis not present

## 2016-09-19 DIAGNOSIS — Z23 Encounter for immunization: Secondary | ICD-10-CM | POA: Diagnosis not present

## 2016-09-19 DIAGNOSIS — Z803 Family history of malignant neoplasm of breast: Secondary | ICD-10-CM | POA: Diagnosis not present

## 2016-09-19 DIAGNOSIS — Q2381 Bicuspid aortic valve: Secondary | ICD-10-CM

## 2016-09-19 DIAGNOSIS — Z7952 Long term (current) use of systemic steroids: Secondary | ICD-10-CM

## 2016-09-19 DIAGNOSIS — I119 Hypertensive heart disease without heart failure: Secondary | ICD-10-CM | POA: Diagnosis present

## 2016-09-19 DIAGNOSIS — Z886 Allergy status to analgesic agent status: Secondary | ICD-10-CM

## 2016-09-19 DIAGNOSIS — H538 Other visual disturbances: Secondary | ICD-10-CM | POA: Diagnosis present

## 2016-09-19 DIAGNOSIS — I248 Other forms of acute ischemic heart disease: Secondary | ICD-10-CM

## 2016-09-19 DIAGNOSIS — J449 Chronic obstructive pulmonary disease, unspecified: Secondary | ICD-10-CM | POA: Diagnosis not present

## 2016-09-19 DIAGNOSIS — I10 Essential (primary) hypertension: Secondary | ICD-10-CM | POA: Diagnosis present

## 2016-09-19 DIAGNOSIS — I2489 Other forms of acute ischemic heart disease: Secondary | ICD-10-CM

## 2016-09-19 DIAGNOSIS — R0602 Shortness of breath: Secondary | ICD-10-CM | POA: Diagnosis present

## 2016-09-19 DIAGNOSIS — Z9109 Other allergy status, other than to drugs and biological substances: Secondary | ICD-10-CM | POA: Diagnosis not present

## 2016-09-19 DIAGNOSIS — D72829 Elevated white blood cell count, unspecified: Secondary | ICD-10-CM

## 2016-09-19 HISTORY — DX: Unspecified asthma, uncomplicated: J45.909

## 2016-09-19 HISTORY — DX: Other forms of acute ischemic heart disease: I24.8

## 2016-09-19 HISTORY — DX: Acute respiratory failure with hypoxia: J96.01

## 2016-09-19 HISTORY — DX: Congenital insufficiency of aortic valve: Q23.1

## 2016-09-19 LAB — CBC WITH DIFFERENTIAL/PLATELET
Basophils Absolute: 0.1 10*3/uL (ref 0–0.1)
Basophils Relative: 0 %
EOS ABS: 0 10*3/uL (ref 0–0.7)
Eosinophils Relative: 0 %
HCT: 37.4 % (ref 35.0–47.0)
HEMOGLOBIN: 12.1 g/dL (ref 12.0–16.0)
LYMPHS ABS: 1 10*3/uL (ref 1.0–3.6)
Lymphocytes Relative: 5 %
MCH: 28.8 pg (ref 26.0–34.0)
MCHC: 32.2 g/dL (ref 32.0–36.0)
MCV: 89.6 fL (ref 80.0–100.0)
MONO ABS: 1.6 10*3/uL — AB (ref 0.2–0.9)
MONOS PCT: 7 %
NEUTROS PCT: 88 %
Neutro Abs: 18.9 10*3/uL — ABNORMAL HIGH (ref 1.4–6.5)
Platelets: 394 10*3/uL (ref 150–440)
RBC: 4.18 MIL/uL (ref 3.80–5.20)
RDW: 16 % — AB (ref 11.5–14.5)
WBC: 21.5 10*3/uL — ABNORMAL HIGH (ref 3.6–11.0)

## 2016-09-19 LAB — TROPONIN I
TROPONIN I: 0.14 ng/mL — AB (ref <0.03)
TROPONIN I: 0.64 ng/mL — AB (ref <0.03)
TROPONIN I: 0.44 ng/mL — AB (ref <0.03)
Troponin I: 0.78 ng/mL (ref <0.03)

## 2016-09-19 LAB — BASIC METABOLIC PANEL
Anion gap: 7 (ref 5–15)
BUN: 21 mg/dL — AB (ref 6–20)
CALCIUM: 9.3 mg/dL (ref 8.9–10.3)
CHLORIDE: 103 mmol/L (ref 101–111)
CO2: 29 mmol/L (ref 22–32)
CREATININE: 0.62 mg/dL (ref 0.44–1.00)
GFR calc Af Amer: >60 mL/min (ref >60)
GFR calc non Af Amer: >60 mL/min (ref >60)
Glucose, Bld: 104 mg/dL — ABNORMAL HIGH (ref 65–99)
Potassium: 4.1 mmol/L (ref 3.5–5.1)
SODIUM: 139 mmol/L (ref 135–145)

## 2016-09-19 LAB — ECHOCARDIOGRAM COMPLETE
HEIGHTINCHES: 56 in
Weight: 2147.2 oz

## 2016-09-19 MED ORDER — ONDANSETRON HCL 4 MG PO TABS: 4.0000 mg | ORAL_TABLET | Freq: Four times a day (QID) | ORAL | Status: DC | PRN

## 2016-09-19 MED ORDER — HYDROCOD POLST-CPM POLST ER 10-8 MG/5ML PO SUER
5.0000 mL | Freq: Two times a day (BID) | ORAL | Status: DC | PRN
  Administered 2016-09-19: 5 mL via ORAL
  Filled 2016-09-19: qty 5

## 2016-09-19 MED ORDER — PANTOPRAZOLE SODIUM 40 MG PO TBEC
40.0000 mg | DELAYED_RELEASE_TABLET | Freq: Every day | ORAL | Status: DC
  Administered 2016-09-19 – 2016-09-22 (×4): 40 mg via ORAL
  Filled 2016-09-19 (×3): qty 1

## 2016-09-19 MED ORDER — HYDRALAZINE HCL 20 MG/ML IJ SOLN: 10.0000 mg | INTRAMUSCULAR | Status: DC | PRN

## 2016-09-19 MED ORDER — IPRATROPIUM-ALBUTEROL 0.5-2.5 (3) MG/3ML IN SOLN
3.0000 mL | RESPIRATORY_TRACT | Status: DC
  Administered 2016-09-19 – 2016-09-22 (×14): 3 mL via RESPIRATORY_TRACT
  Filled 2016-09-19 (×15): qty 3

## 2016-09-19 MED ORDER — IPRATROPIUM-ALBUTEROL 0.5-2.5 (3) MG/3ML IN SOLN
3.0000 mL | Freq: Once | RESPIRATORY_TRACT | Status: AC
  Administered 2016-09-19: 3 mL via RESPIRATORY_TRACT
  Filled 2016-09-19: qty 3

## 2016-09-19 MED ORDER — METHYLPREDNISOLONE SODIUM SUCC 125 MG IJ SOLR
INTRAMUSCULAR | Status: AC
  Filled 2016-09-19: qty 2

## 2016-09-19 MED ORDER — ONDANSETRON HCL 4 MG/2ML IJ SOLN: 4.0000 mg | Freq: Four times a day (QID) | INTRAMUSCULAR | Status: DC | PRN

## 2016-09-19 MED ORDER — VITAMIN B-12 1000 MCG PO TABS
1000.0000 ug | ORAL_TABLET | Freq: Every day | ORAL | Status: DC
  Administered 2016-09-19 – 2016-09-22 (×4): 1000 ug via ORAL
  Filled 2016-09-19 (×4): qty 1

## 2016-09-19 MED ORDER — AZITHROMYCIN 250 MG PO TABS
250.0000 mg | ORAL_TABLET | Freq: Every day | ORAL | Status: AC
  Administered 2016-09-19 – 2016-09-20 (×2): 250 mg via ORAL
  Filled 2016-09-19 (×2): qty 1

## 2016-09-19 MED ORDER — MAGNESIUM OXIDE 400 (241.3 MG) MG PO TABS
400.0000 mg | ORAL_TABLET | Freq: Every day | ORAL | Status: DC
  Administered 2016-09-19 – 2016-09-22 (×4): 400 mg via ORAL
  Filled 2016-09-19 (×4): qty 1

## 2016-09-19 MED ORDER — FERROUS SULFATE 325 (65 FE) MG PO TABS
325.0000 mg | ORAL_TABLET | Freq: Two times a day (BID) | ORAL | Status: DC
  Administered 2016-09-19 – 2016-09-22 (×7): 325 mg via ORAL
  Filled 2016-09-19 (×8): qty 1

## 2016-09-19 MED ORDER — LISINOPRIL 10 MG PO TABS
10.0000 mg | ORAL_TABLET | Freq: Every day | ORAL | Status: DC
  Administered 2016-09-19 – 2016-09-22 (×3): 10 mg via ORAL
  Filled 2016-09-19 (×4): qty 1

## 2016-09-19 MED ORDER — ACETAMINOPHEN 325 MG PO TABS: 650.0000 mg | ORAL_TABLET | Freq: Four times a day (QID) | ORAL | Status: DC | PRN

## 2016-09-19 MED ORDER — BENZONATATE 100 MG PO CAPS
200.0000 mg | ORAL_CAPSULE | Freq: Three times a day (TID) | ORAL | Status: DC | PRN
  Administered 2016-09-19 – 2016-09-21 (×5): 200 mg via ORAL
  Filled 2016-09-19 (×5): qty 2

## 2016-09-19 MED ORDER — IPRATROPIUM-ALBUTEROL 0.5-2.5 (3) MG/3ML IN SOLN
9.0000 mL | Freq: Once | RESPIRATORY_TRACT | Status: AC
  Administered 2016-09-19: 9 mL via RESPIRATORY_TRACT

## 2016-09-19 MED ORDER — IPRATROPIUM-ALBUTEROL 0.5-2.5 (3) MG/3ML IN SOLN
RESPIRATORY_TRACT | Status: AC
  Filled 2016-09-19: qty 9

## 2016-09-19 MED ORDER — LISINOPRIL-HYDROCHLOROTHIAZIDE 10-12.5 MG PO TABS: 1.0000 | ORAL_TABLET | Freq: Every day | ORAL | Status: DC

## 2016-09-19 MED ORDER — OMEGA-3-ACID ETHYL ESTERS 1 G PO CAPS
1.0000 g | ORAL_CAPSULE | Freq: Every day | ORAL | Status: DC
  Administered 2016-09-19 – 2016-09-22 (×4): 1 g via ORAL
  Filled 2016-09-19 (×4): qty 1

## 2016-09-19 MED ORDER — METHYLPREDNISOLONE SODIUM SUCC 125 MG IJ SOLR
125.0000 mg | Freq: Once | INTRAMUSCULAR | Status: AC
  Administered 2016-09-19: 125 mg via INTRAVENOUS

## 2016-09-19 MED ORDER — HYDROCHLOROTHIAZIDE 12.5 MG PO CAPS
12.5000 mg | ORAL_CAPSULE | Freq: Every day | ORAL | Status: DC
  Administered 2016-09-20: 12.5 mg via ORAL
  Filled 2016-09-19 (×4): qty 1

## 2016-09-19 MED ORDER — OXYCODONE HCL 5 MG PO TABS: 5.0000 mg | ORAL_TABLET | ORAL | Status: DC | PRN

## 2016-09-19 MED ORDER — ENOXAPARIN SODIUM 40 MG/0.4ML ~~LOC~~ SOLN
40.0000 mg | Freq: Every day | SUBCUTANEOUS | Status: DC
  Administered 2016-09-19: 40 mg via SUBCUTANEOUS
  Filled 2016-09-19: qty 0.4

## 2016-09-19 MED ORDER — BENZONATATE 100 MG PO CAPS: 200.0000 mg | ORAL_CAPSULE | Freq: Three times a day (TID) | ORAL | Status: DC | PRN

## 2016-09-19 MED ORDER — FOLIC ACID 1 MG PO TABS
1000.0000 ug | ORAL_TABLET | Freq: Every day | ORAL | Status: DC
  Administered 2016-09-19 – 2016-09-22 (×4): 1 mg via ORAL
  Filled 2016-09-19 (×4): qty 1

## 2016-09-19 MED ORDER — ACETAMINOPHEN 650 MG RE SUPP: 650.0000 mg | Freq: Four times a day (QID) | RECTAL | Status: DC | PRN

## 2016-09-19 MED ORDER — ENOXAPARIN SODIUM 60 MG/0.6ML ~~LOC~~ SOLN
1.0000 mg/kg | Freq: Two times a day (BID) | SUBCUTANEOUS | Status: DC
  Administered 2016-09-19 – 2016-09-20 (×2): 60 mg via SUBCUTANEOUS
  Filled 2016-09-19 (×2): qty 0.6

## 2016-09-19 MED ORDER — METHYLPREDNISOLONE SODIUM SUCC 125 MG IJ SOLR
60.0000 mg | INTRAMUSCULAR | Status: DC
  Administered 2016-09-19 – 2016-09-20 (×2): 60 mg via INTRAVENOUS
  Filled 2016-09-19 (×2): qty 2

## 2016-09-19 MED ORDER — VITAMIN C 500 MG PO TABS
1000.0000 mg | ORAL_TABLET | Freq: Two times a day (BID) | ORAL | Status: DC
  Administered 2016-09-19 – 2016-09-22 (×7): 1000 mg via ORAL
  Filled 2016-09-19 (×7): qty 2

## 2016-09-19 NOTE — ED Triage Notes (Signed)
Patient presents with worsening SOB that began earlier in the week. Patient was treated on Wednesday by PCP with [facility] for acute asthma exacerbation. Patient Rx'd Prednisone taper and Azithromycin. Patient presents this morning with audible wheezing. RA SPO2 81%.

## 2016-09-19 NOTE — Progress Notes (Signed)
Contacted Dr to tell him trop elevated. No new orders

## 2016-09-19 NOTE — ED Provider Notes (Signed)
[facility] Emergency Department Provider Note   ____________________________________________   First MD Initiated Contact with Patient 09/19/16 [phone]     (approximate)  I have reviewed the triage vital signs and the nursing notes.   HISTORY  Chief Complaint Shortness of Breath and Wheezing    HPI Michelle Barnett is a 55 y.o. female who presents to the ED from home with a chief complaint of shortness of breath. Patient has a history of COPD, not on home oxygen, who was seen by her PCP 2 days ago with albuterol nebulizer treatment for asthma exacerbation. She was prescribed prednisone taper and azithromycin. Presents with progressive shortness of breath this morning. Room air saturations 81%. Complains of nonproductive cough and chest tightness. Denies associated fever, chills, abdominal pain, nausea, vomiting, diarrhea. Denies recent travel trauma. Nothing makes her symptoms better or worse.   Past Medical History:  Diagnosis Date  . Bronchitis   . Heart murmur   . Hiatal hernia   . History of blood transfusion   . Hypertension   . Ulcer     Patient Active Problem List   Diagnosis Date Noted  . Acute respiratory failure with hypoxia (HCC) 09/19/2016  . GI bleed 08/12/2016  . Melena   . Well woman exam 06/15/2016  . Obesity 01/27/2016  . Anemia, iron deficiency 07/30/2014  . Essential hypertension 07/30/2014  . COPD, severity to be determined (HCC) 07/30/2014  . Acute blood loss anemia 07/30/2014  . History of GI bleed 07/30/2014  . Vitamin B12 deficiency 07/30/2014  . Adjustment disorder with mixed anxiety and depressed mood 07/30/2014    Past Surgical History:  Procedure Laterality Date  . abdominal tumor    . ABLATION    . COLONOSCOPY N/A 08/14/2016   Procedure: COLONOSCOPY;  Surgeon: Sherrilyn Rist, MD;  Location: [facility] ENDOSCOPY;  Service: Endoscopy;  Laterality: N/A;  . ESOPHAGOGASTRODUODENOSCOPY N/A 08/13/2016   Procedure:  ESOPHAGOGASTRODUODENOSCOPY (EGD);  Surgeon: Sherrilyn Rist, MD;  Location: [facility] ENDOSCOPY;  Service: Gastroenterology;  Laterality: N/A;  . TOOTH EXTRACTION      Prior to Admission medications   Medication Sig Start Date End Date Taking? Authorizing Provider  albuterol (PROVENTIL) (2.5 MG/3ML) 0.083% nebulizer solution Take 2.5 mg by nebulization every 2 (two) hours as needed for wheezing or shortness of breath.   Yes Historical Provider, MD  albuterol (VENTOLIN HFA) 108 (90 Base) MCG/ACT inhaler INHALE 2 PUFFS INTO THE LUNGS EVERY 6 (SIX) HOURS AS NEEDED FOR WHEEZING OR SHORTNESS OF BREATH. 06/11/16  Yes Dianne Dun, MD  Ascorbic Acid (VITAMIN C) 1000 MG tablet Take 1,000 mg by mouth 2 (two) times daily.    Yes Historical Provider, MD  azithromycin (ZITHROMAX) 250 MG tablet Take two tablets today then one a day until finished 09/16/16  Yes Emi Belfast, FNP  chlorpheniramine-HYDROcodone (TUSSIONEX PENNKINETIC ER) 10-8 MG/5ML SUER Take 5 mLs by mouth every 12 (twelve) hours as needed for cough. 09/16/16  Yes Emi Belfast, FNP  ferrous sulfate 325 (65 FE) MG tablet Take 325 mg by mouth 2 (two) times daily.    Yes Historical Provider, MD  folic acid (FOLVITE) 800 MCG tablet Take 800 mcg by mouth daily.    Yes Historical Provider, MD  lisinopril-hydrochlorothiazide (PRINZIDE,ZESTORETIC) 10-12.5 MG tablet Take 1 tablet by mouth daily. 06/11/16  Yes Dianne Dun, MD  magnesium oxide (MAG-OX) 400 MG tablet Take 400 mg by mouth daily.   Yes Historical Provider, MD  Multiple  Vitamins-Calcium (ONE-A-DAY WOMENS PO) Take 1 tablet by mouth daily.   Yes Historical Provider, MD  Omega-3 Fatty Acids (FISH OIL) 1200 MG CAPS Take 1 capsule by mouth daily.    Yes Historical Provider, MD  pantoprazole (PROTONIX) 40 MG tablet Take 1 tablet (40 mg total) by mouth daily at 6 (six) AM. 08/14/16  Yes Jeralyn Bennett, MD  predniSONE (DELTASONE) 20 MG tablet Take 3 tablets x 3 days, then 2 tablets x 3 days, then 1  tablet x 3 days. 09/16/16  Yes Emi Belfast, FNP  vitamin B-12 (CYANOCOBALAMIN) 1000 MCG tablet Take 1,000 mcg by mouth daily.   Yes Historical Provider, MD    Allergies Aspirin; Dairy aid [lactase]; Benadryl [diphenhydramine]; and Penicillins  Family History  Problem Relation Age of Onset  . Stroke Mother   . Hypertension Mother   . Hypertension Father   . Heart disease Father   . Hypertension Brother   . Breast cancer Maternal Aunt     Social History Social History  Substance Use Topics  . Smoking status: Never Smoker  . Smokeless tobacco: Never Used  . Alcohol use No    Review of Systems  Constitutional: No fever/chills. Eyes: No visual changes. ENT: No sore throat. Cardiovascular: Positive for chest pain. Respiratory: Positive for shortness of breath. Gastrointestinal: No abdominal pain.  No nausea, no vomiting.  No diarrhea.  No constipation. Genitourinary: Negative for dysuria. Musculoskeletal: Negative for back pain. Skin: Negative for rash. Neurological: Negative for headaches, focal weakness or numbness.  10-point ROS otherwise negative.  ____________________________________________   PHYSICAL EXAM:  VITAL SIGNS: ED Triage Vitals [09/19/16 0624]  Enc Vitals Group     BP (!) 181/102     Pulse Rate (!) 108     Resp (!) 32     Temp 98.2 F (36.8 C)     Temp Source Oral     SpO2 (!) 81 %     Weight 132 lb (59.9 kg)     Height      Head Circumference      Peak Flow      Pain Score      Pain Loc      Pain Edu?      Excl. in GC?     Constitutional: Alert and oriented. Well appearing and in moderate acute distress. Eyes: Conjunctivae are normal. PERRL. EOMI. Head: Atraumatic. Nose: No congestion/rhinnorhea. Mouth/Throat: Mucous membranes are moist.  Oropharynx non-erythematous. Neck: No stridor.   Cardiovascular: Tachycardic rate, regular rhythm. Grossly normal heart sounds.  Good peripheral circulation. Respiratory: Increased respiratory  effort.  Retractions. Lungs with diffuse wheezing. Gastrointestinal: Soft and nontender. No distention. No abdominal bruits. No CVA tenderness. Musculoskeletal: No lower extremity tenderness nor edema.  No joint effusions. Neurologic:  Normal speech and language. No gross focal neurologic deficits are appreciated.  Skin:  Skin is warm, dry and intact. No rash noted. Psychiatric: Mood and affect are normal. Speech and behavior are normal.  ____________________________________________   LABS (all labs ordered are listed, but only abnormal results are displayed)  Labs Reviewed  CBC WITH DIFFERENTIAL/PLATELET - Abnormal; Notable for the following:       Result Value   WBC 21.5 (*)    RDW 16.0 (*)    Neutro Abs 18.9 (*)    Monocytes Absolute 1.6 (*)    All other components within normal limits  BASIC METABOLIC PANEL - Abnormal; Notable for the following:    Glucose, Bld 104 (*)  BUN 21 (*)    All other components within normal limits  TROPONIN I - Abnormal; Notable for the following:    Troponin I 0.14 (*)    All other components within normal limits  CULTURE, BLOOD (ROUTINE X 2)  CULTURE, BLOOD (ROUTINE X 2)  CBC  CREATININE, SERUM   ____________________________________________  EKG  ED ECG REPORT I, Elisandro Jarrett J, the attending physician, personally viewed and interpreted this ECG.   Date: 09/19/2016  EKG Time: 0647  Rate: 116  Rhythm: sinus tachycardia  Axis: WNL  Intervals:LVH  ST&T Change: Nonspecific  ____________________________________________  RADIOLOGY  Pending radiology interpretation  ____________________________________________   PROCEDURES  Procedure(s) performed: None  Procedures  Critical Care performed: Yes, see critical care note(s)  CRITICAL CARE Performed by: Irean HongSUNG,Breeona Waid J   Total critical care time: 30 minutes  Critical care time was exclusive of separately billable procedures and treating other patients.  Critical care was  necessary to treat or prevent imminent or life-threatening deterioration.  Critical care was time spent personally by me on the following activities: development of treatment plan with patient and/or surrogate as well as nursing, discussions with consultants, evaluation of patient's response to treatment, examination of patient, obtaining history from patient or surrogate, ordering and performing treatments and interventions, ordering and review of laboratory studies, ordering and review of radiographic studies, pulse oximetry and re-evaluation of patient's condition. ____________________________________________   INITIAL IMPRESSION / ASSESSMENT AND PLAN / ED COURSE  Pertinent labs & imaging results that were available during my care of the patient were reviewed by me and considered in my medical decision making (see chart for details).  55 year old female with a history of COPD who presents with exacerbation. She is tachypneic, tachycardic and hypoxic. We'll initiate IV Solu-Medrol, nebulizer treatments and reassess.  Clinical Course  Comment By Time  Updated patient and spouse of laboratory results. Slightly improved after neb treatment. Discussed with hospitalist who will evaluate patient in the emergency department for admission. Irean HongJade J Oddie Kuhlmann, MD 09/23 [phone]     ____________________________________________   FINAL CLINICAL IMPRESSION(S) / ED DIAGNOSES  Final diagnoses:  COPD exacerbation (HCC)  Leukocytosis  Hypoxia      NEW MEDICATIONS STARTED DURING THIS VISIT:  New Prescriptions   No medications on file     Note:  This document was prepared using Dragon voice recognition software and may include unintentional dictation errors.    Irean HongJade J Constanza Mincy, MD 09/19/16 0730

## 2016-09-19 NOTE — Progress Notes (Signed)
Pt up ambulating about in room without c/o dizzyness or altered gait. She went down for an ECHO by bed and is back. She has no questions or concerns about test. She is sitting up in a chair watching TV.

## 2016-09-19 NOTE — Progress Notes (Signed)
Nurse attempted to call report however floor would not accept patient due to positive troponins.  Dr, Clint GuyHower called and notified and patient order changed to telemetry.

## 2016-09-19 NOTE — Progress Notes (Signed)
ANTICOAGULATION CONSULT NOTE - Initial Consult  Pharmacy Consult for Enoxaparin Indication: chest pain/ACS (NSTEMI)  Allergies  Allergen Reactions  . Aspirin Anaphylaxis  . Dairy Aid [Lactase] Swelling and Other (See Comments)    Any dairy products  . Benadryl [Diphenhydramine] Palpitations  . Penicillins Other (See Comments)    Reaction: Unknown    Patient Measurements: Height: 4\' 8"  (142.2 cm) Weight: 134 lb 3.2 oz (60.9 kg) IBW/kg (Calculated) : 36.3   Vital Signs: Temp: 98.1 F (36.7 C) (09/23 1019) Temp Source: Oral (09/23 1019) BP: 127/72 (09/23 1019) Pulse Rate: 99 (09/23 1019)  Labs:  Recent Labs  09/19/16 0631 09/19/16 1003  HGB 12.1  --   HCT 37.4  --   PLT 394  --   CREATININE 0.62  --   TROPONINI 0.14* 0.78*    Estimated Creatinine Clearance: 57.1 mL/min (by C-G formula based on SCr of 0.62 mg/dL).   Medical History: Past Medical History:  Diagnosis Date  . Bronchitis   . Heart murmur   . Hiatal hernia   . History of blood transfusion   . Hypertension   . Ulcer      Assessment: 55 yo female with troponins trending up, per MD note treating as NSTEMI. No anticoagulants PTA. Pt received enoxaparin 40 mg at 1120.   Hgb 12.1, Plt 394   Goal of Therapy:  Anti-Xa level 0.6-1 units/ml 4hrs after LMWH dose given Monitor platelets by anticoagulation protocol: Yes   Plan:  Lovenox 1 mg/kg (=60 mg) SQ q12h, will give next dose a little sooner due lower first dose.  CBC and SCr every three days.   Pharmacy will continue to follow.   Marty HeckWang, Rhya Shan L 09/19/2016,2:25 PM

## 2016-09-19 NOTE — Progress Notes (Signed)
Patient c/o of coughing. Paged the doctor Oralia ManisWillis David. Verbal order was given, benzonatate cap q8h PRN ordered for cough. Will continue to monitor.   Setareh Rom, RN

## 2016-09-19 NOTE — ED Notes (Signed)
Informed RN bed ready 

## 2016-09-19 NOTE — Progress Notes (Signed)
Nurse called to inform elevated troponin - patient otherwise stable Given upwards trend will treat as nstemi

## 2016-09-19 NOTE — Progress Notes (Signed)
*  PRELIMINARY RESULTS* Echocardiogram 2D Echocardiogram has been performed.  Michelle Barnett 09/19/2016, 4:09 PM

## 2016-09-19 NOTE — ED Notes (Signed)
[facility]  Patient transported to 2A via stretcher by Mardelle MatteAndy.

## 2016-09-19 NOTE — ED Notes (Signed)
Only 1 solumedrol given but 2 overrode and 1 wasted.

## 2016-09-19 NOTE — H&P (Signed)
[employer] [facility] [place] [facility]   PATIENT NAME: Michelle Barnett    MR#:  [id number]  DATE OF BIRTH:  Oct 23, 1961   DATE OF ADMISSION:  09/19/2016  PRIMARY CARE PHYSICIAN: Ruthe Mannan, MD   REQUESTING/REFERRING PHYSICIAN: Dolores Frame  CHIEF COMPLAINT:   Chief Complaint  Patient presents with  . Shortness of Breath  . Wheezing    HISTORY OF PRESENT ILLNESS:  Michelle Barnett  is a 55 y.o. female with a known history of COPD non-oxygen requiring who is presenting with shortness of breath. She describes almost two-week duration of symptoms including shortness of breath, nonproductive cough. She has seen her PCP recently started on azithromycin, prednisone, breathing treatments, however, symptoms continued to progress. When she awoke she felt extremely short of breath this morning unable to catch her breath-given worsening symptoms present Hospital further workup and evaluation.  She denies any fevers, chills, chest pain  Upon arrival to emergency department noted be hypoxic-saturations 81% room air  PAST MEDICAL HISTORY:   Past Medical History:  Diagnosis Date  . Bronchitis   . Heart murmur   . Hiatal hernia   . History of blood transfusion   . Hypertension   . Ulcer     PAST SURGICAL HISTORY:   Past Surgical History:  Procedure Laterality Date  . abdominal tumor    . ABLATION    . COLONOSCOPY N/A 08/14/2016   Procedure: COLONOSCOPY;  Surgeon: Sherrilyn Rist, MD;  Location: [facility] ENDOSCOPY;  Service: Endoscopy;  Laterality: N/A;  . ESOPHAGOGASTRODUODENOSCOPY N/A 08/13/2016   Procedure: ESOPHAGOGASTRODUODENOSCOPY (EGD);  Surgeon: Sherrilyn Rist, MD;  Location: [facility] ENDOSCOPY;  Service: Gastroenterology;  Laterality: N/A;  . TOOTH EXTRACTION      SOCIAL HISTORY:   Social History  Substance Use Topics  . Smoking status: Never Smoker  . Smokeless tobacco: Never Used  . Alcohol use No    FAMILY HISTORY:   Family History  Problem Relation Age of Onset    . Stroke Mother   . Hypertension Mother   . Hypertension Father   . Heart disease Father   . Hypertension Brother   . Breast cancer Maternal Aunt     DRUG ALLERGIES:   Allergies  Allergen Reactions  . Aspirin Anaphylaxis  . Dairy Aid [Lactase] Swelling and Other (See Comments)    Any dairy products  . Benadryl [Diphenhydramine] Palpitations  . Penicillins Other (See Comments)    Reaction: Unknown    REVIEW OF SYSTEMS:  REVIEW OF SYSTEMS:  CONSTITUTIONAL: Denies fevers, chills, fatigue, weakness.  EYES: Denies blurred vision, double vision, or eye pain.  EARS, NOSE, THROAT: Denies tinnitus, ear pain, hearing loss.  RESPIRATORY: Positive cough, shortness of breath, wheezing  CARDIOVASCULAR: Denies chest pain, palpitations, edema.  GASTROINTESTINAL: Denies nausea, vomiting, diarrhea, abdominal pain.  GENITOURINARY: Denies dysuria, hematuria.  ENDOCRINE: Denies nocturia or thyroid problems. HEMATOLOGIC AND LYMPHATIC: Denies easy bruising or bleeding.  SKIN: Denies rash or lesions.  MUSCULOSKELETAL: Denies pain in neck, back, shoulder, knees, hips, or further arthritic symptoms.  NEUROLOGIC: Denies paralysis, paresthesias.  PSYCHIATRIC: Denies anxiety or depressive symptoms. Otherwise full review of systems performed by me is negative.   MEDICATIONS AT HOME:   Prior to Admission medications   Medication Sig Start Date End Date Taking? Authorizing Provider  albuterol (PROVENTIL) (2.5 MG/3ML) 0.083% nebulizer solution Take 2.5 mg by nebulization every 2 (two) hours as needed for wheezing or shortness of breath.   Yes Historical Provider, MD  albuterol (  VENTOLIN HFA) 108 (90 Base) MCG/ACT inhaler INHALE 2 PUFFS INTO THE LUNGS EVERY 6 (SIX) HOURS AS NEEDED FOR WHEEZING OR SHORTNESS OF BREATH. 06/11/16  Yes Dianne Dunalia M Aron, MD  Ascorbic Acid (VITAMIN C) 1000 MG tablet Take 1,000 mg by mouth 2 (two) times daily.    Yes Historical Provider, MD  azithromycin (ZITHROMAX) 250 MG tablet  Take two tablets today then one a day until finished 09/16/16  Yes Emi Belfasteborah B Gessner, FNP  chlorpheniramine-HYDROcodone (TUSSIONEX PENNKINETIC ER) 10-8 MG/5ML SUER Take 5 mLs by mouth every 12 (twelve) hours as needed for cough. 09/16/16  Yes Emi Belfasteborah B Gessner, FNP  ferrous sulfate 325 (65 FE) MG tablet Take 325 mg by mouth 2 (two) times daily.    Yes Historical Provider, MD  folic acid (FOLVITE) 800 MCG tablet Take 800 mcg by mouth daily.    Yes Historical Provider, MD  lisinopril-hydrochlorothiazide (PRINZIDE,ZESTORETIC) 10-12.5 MG tablet Take 1 tablet by mouth daily. 06/11/16  Yes Dianne Dunalia M Aron, MD  magnesium oxide (MAG-OX) 400 MG tablet Take 400 mg by mouth daily.   Yes Historical Provider, MD  Multiple Vitamins-Calcium (ONE-A-DAY WOMENS PO) Take 1 tablet by mouth daily.   Yes Historical Provider, MD  Omega-3 Fatty Acids (FISH OIL) 1200 MG CAPS Take 1 capsule by mouth daily.    Yes Historical Provider, MD  pantoprazole (PROTONIX) 40 MG tablet Take 1 tablet (40 mg total) by mouth daily at 6 (six) AM. 08/14/16  Yes Jeralyn BennettEzequiel Zamora, MD  predniSONE (DELTASONE) 20 MG tablet Take 3 tablets x 3 days, then 2 tablets x 3 days, then 1 tablet x 3 days. 09/16/16  Yes Emi Belfasteborah B Gessner, FNP  vitamin B-12 (CYANOCOBALAMIN) 1000 MCG tablet Take 1,000 mcg by mouth daily.   Yes Historical Provider, MD      VITAL SIGNS:  Blood pressure (!) 181/102, pulse (!) 108, temperature 98.2 F (36.8 C), temperature source Oral, resp. rate (!) 32, weight 59.9 kg (132 lb), SpO2 (!) 81 %.  PHYSICAL EXAMINATION:  VITAL SIGNS: Vitals:   09/19/16 0624  BP: (!) 181/102  Pulse: (!) 108  Resp: (!) 32  Temp: 98.2 F (36.8 C)   GENERAL:55 y.o.female currently in no acute distress.  HEAD: Normocephalic, atraumatic.  EYES: Pupils equal, round, reactive to light. Extraocular muscles intact. No scleral icterus.  MOUTH: Moist mucosal membrane. Dentition intact. No abscess noted.  EAR, NOSE, THROAT: Clear without exudates. No  external lesions.  NECK: Supple. No thyromegaly. No nodules. No JVD.  PULMONARY: Coarse rhonchi, without wheeze at this time. No use of accessory muscles, Good respiratory effort. good air entry bilaterally CHEST: Nontender to palpation.  CARDIOVASCULAR: S1 and S2. Tachycardic 3/6 systolic ejection murmur, rubs, or gallops. No edema. Pedal pulses 2+ bilaterally.  GASTROINTESTINAL: Soft, nontender, nondistended. No masses. Positive bowel sounds. No hepatosplenomegaly.  MUSCULOSKELETAL: No swelling, clubbing, or edema. Range of motion full in all extremities.  NEUROLOGIC: Cranial nerves II through XII are intact. No gross focal neurological deficits. Sensation intact. Reflexes intact.  SKIN: No ulceration, lesions, rashes, or cyanosis. Skin warm and dry. Turgor intact.  PSYCHIATRIC: Mood, affect within normal limits. The patient is awake, alert and oriented x 3. Insight, judgment intact.    LABORATORY PANEL:   CBC  Recent Labs Lab 09/19/16 0631  WBC 21.5*  HGB 12.1  HCT 37.4  PLT 394   ------------------------------------------------------------------------------------------------------------------  Chemistries   Recent Labs Lab 09/19/16 0631  NA 139  K 4.1  CL 103  CO2 29  GLUCOSE  104*  BUN 21*  CREATININE 0.62  CALCIUM 9.3   ------------------------------------------------------------------------------------------------------------------  Cardiac Enzymes  Recent Labs Lab 09/19/16 0631  TROPONINI 0.14*   ------------------------------------------------------------------------------------------------------------------  RADIOLOGY:  Dg Chest Portable 1 View  Result Date: 09/19/2016 CLINICAL DATA:  Pt states that for about 2 weeks now she has been feeling very SOB, finally went to doctor this week sometime and was given an antibiotic. They said she needed to rest her body until Friday and she went back to work Friday and now the breathing has increased. Hx of asthma  and heart murmur. Hx of HH and HTN. Nonsmoker. EXAM: PORTABLE CHEST 1 VIEW COMPARISON:  12/17/2014 FINDINGS: Cardiac silhouette is normal in size. Moderate to large hiatal hernia stable. No mediastinal or hilar masses or convincing adenopathy. Lungs are mildly hyperexpanded but clear. No pleural effusion. No pneumothorax. The bony thorax is intact. IMPRESSION: 1. No acute cardiopulmonary disease. Electronically Signed   By: Amie Portland M.D.   On: 09/19/2016 07:37    EKG:   Orders placed or performed during the hospital encounter of 09/19/16  . ED EKG  . ED EKG    IMPRESSION AND PLAN:   55 year old Caucasian female presented with shortness of breath  1.Chronic obstructive pulmonary disease exacerbation: Provide DuoNeb treatments q. 4 hours, Solu-Medrol 60 mg IV q. daily, continue azithromycin. Continue with home medications.  2. Elevated troponin: Trend cardiac enzymes 3 3. Essential hypertension, accelerated: Continue home medications, add as needed hydralazine 4. GERD without esophagitis: PPI therapy      All the records are reviewed and case discussed with ED provider. Management plans discussed with the patient, family and they are in agreement.  CODE STATUS: Full  TOTAL TIME TAKING CARE OF THIS PATIENT: 33 minutes.    Latoya Diskin,  Mardi Mainland.D on 09/19/2016 at 7:52 AM  Between 7am to 6pm - Pager - [phone]  After 6pm: House Pager: - [phone]  [facility]  Office  [phone]  CC: Primary care physician; Ruthe Mannan, MD

## 2016-09-19 NOTE — ED Notes (Signed)
Critical lab of 0.14 troponin called in by lab caitlin, md notified

## 2016-09-20 ENCOUNTER — Encounter: Payer: Self-pay | Admitting: Cardiovascular Disease

## 2016-09-20 DIAGNOSIS — I248 Other forms of acute ischemic heart disease: Secondary | ICD-10-CM

## 2016-09-20 DIAGNOSIS — Q231 Congenital insufficiency of aortic valve: Secondary | ICD-10-CM

## 2016-09-20 DIAGNOSIS — I10 Essential (primary) hypertension: Secondary | ICD-10-CM

## 2016-09-20 DIAGNOSIS — J441 Chronic obstructive pulmonary disease with (acute) exacerbation: Secondary | ICD-10-CM

## 2016-09-20 DIAGNOSIS — I2489 Other forms of acute ischemic heart disease: Secondary | ICD-10-CM

## 2016-09-20 DIAGNOSIS — J9601 Acute respiratory failure with hypoxia: Secondary | ICD-10-CM

## 2016-09-20 DIAGNOSIS — Q2381 Bicuspid aortic valve: Secondary | ICD-10-CM

## 2016-09-20 HISTORY — DX: Other forms of acute ischemic heart disease: I24.89

## 2016-09-20 HISTORY — DX: Other forms of acute ischemic heart disease: I24.8

## 2016-09-20 HISTORY — DX: Congenital insufficiency of aortic valve: Q23.1

## 2016-09-20 HISTORY — DX: Bicuspid aortic valve: Q23.81

## 2016-09-20 LAB — BLOOD CULTURE ID PANEL (REFLEXED)
Acinetobacter baumannii: NOT DETECTED
CANDIDA KRUSEI: NOT DETECTED
Candida albicans: NOT DETECTED
Candida glabrata: NOT DETECTED
Candida parapsilosis: NOT DETECTED
Candida tropicalis: NOT DETECTED
ENTEROCOCCUS SPECIES: NOT DETECTED
ESCHERICHIA COLI: NOT DETECTED
Enterobacter cloacae complex: NOT DETECTED
Enterobacteriaceae species: NOT DETECTED
HAEMOPHILUS INFLUENZAE: NOT DETECTED
Klebsiella oxytoca: NOT DETECTED
Klebsiella pneumoniae: NOT DETECTED
LISTERIA MONOCYTOGENES: NOT DETECTED
NEISSERIA MENINGITIDIS: NOT DETECTED
PROTEUS SPECIES: NOT DETECTED
PSEUDOMONAS AERUGINOSA: NOT DETECTED
SERRATIA MARCESCENS: NOT DETECTED
STAPHYLOCOCCUS AUREUS BCID: NOT DETECTED
STAPHYLOCOCCUS SPECIES: NOT DETECTED
STREPTOCOCCUS AGALACTIAE: NOT DETECTED
STREPTOCOCCUS PNEUMONIAE: NOT DETECTED
STREPTOCOCCUS PYOGENES: NOT DETECTED
STREPTOCOCCUS SPECIES: NOT DETECTED

## 2016-09-20 MED ORDER — GUAIFENESIN-CODEINE 100-10 MG/5ML PO SOLN
10.0000 mL | ORAL | Status: DC | PRN
  Administered 2016-09-20: 5 mL via ORAL
  Filled 2016-09-20: qty 5

## 2016-09-20 MED ORDER — LEVOFLOXACIN IN D5W 750 MG/150ML IV SOLN
750.0000 mg | INTRAVENOUS | Status: DC
  Administered 2016-09-20 – 2016-09-21 (×2): 750 mg via INTRAVENOUS
  Filled 2016-09-20 (×3): qty 150

## 2016-09-20 MED ORDER — PNEUMOCOCCAL VAC POLYVALENT 25 MCG/0.5ML IJ INJ
0.5000 mL | INJECTION | INTRAMUSCULAR | Status: AC
  Administered 2016-09-21: 0.5 mL via INTRAMUSCULAR
  Filled 2016-09-20: qty 0.5

## 2016-09-20 NOTE — Progress Notes (Signed)
[facility] [place] [facility]   PATIENT NAME: Michelle RamsayCathy Barnett    MRN#:  [id number]  DATE OF BIRTH:  1961-04-03  SUBJECTIVE:  Hospital Day: 1 day Michelle RamsayCathy Barnett is a 55 y.o. female presenting with Shortness of Breath and Wheezing .   Overnight events: No acute overnight events Interval Events: Some complaints of cough but improved with medicines, breathing improved but not yet at baseline  REVIEW OF SYSTEMS:  CONSTITUTIONAL: No fever, fatigue or weakness.  EYES: No blurred or double vision.  EARS, NOSE, AND THROAT: No tinnitus or ear pain.  RESPIRATORY: Positive cough, improved shortness of breath, denies wheezing or hemoptysis.  CARDIOVASCULAR: No chest pain, orthopnea, edema.  GASTROINTESTINAL: No nausea, vomiting, diarrhea or abdominal pain.  GENITOURINARY: No dysuria, hematuria.  ENDOCRINE: No polyuria, nocturia,  HEMATOLOGY: No anemia, easy bruising or bleeding SKIN: No rash or lesion. MUSCULOSKELETAL: No joint pain or arthritis.   NEUROLOGIC: No tingling, numbness, weakness.  PSYCHIATRY: No anxiety or depression.   DRUG ALLERGIES:   Allergies  Allergen Reactions  . Aspirin Anaphylaxis  . Dairy Aid [Lactase] Swelling and Other (See Comments)    Any dairy products  . Benadryl [Diphenhydramine] Palpitations  . Penicillins Other (See Comments)    Reaction: Unknown    VITALS:  Blood pressure 120/65, pulse 96, temperature 98 F (36.7 C), temperature source Oral, resp. rate 14, height 4\' 8"  (1.422 m), weight 60.9 kg (134 lb 3.2 oz), SpO2 92 %.  PHYSICAL EXAMINATION:  VITAL SIGNS: Vitals:   09/20/16 0623 09/20/16 1118  BP:  120/65  Pulse: 67 96  Resp:  14  Temp:  98 F (36.7 C)   GENERAL:55 y.o.female currently in no acute distress.  HEAD: Normocephalic, atraumatic.  EYES: Pupils equal, round, reactive to light. Extraocular muscles intact. No scleral icterus.  MOUTH: Moist mucosal membrane. Dentition intact. No abscess noted.   EAR, NOSE, THROAT: Clear without exudates. No external lesions.  NECK: Supple. No thyromegaly. No nodules. No JVD.  PULMONARY: Diminished breath sounds left side without wheeze rails or rhonci. No use of accessory muscles, Good respiratory effort. good air entry bilaterally CHEST: Nontender to palpation.  CARDIOVASCULAR: S1 and S2. Regular rate and rhythm. No murmurs, rubs, or gallops. No edema. Pedal pulses 2+ bilaterally.  GASTROINTESTINAL: Soft, nontender, nondistended. No masses. Positive bowel sounds. No hepatosplenomegaly.  MUSCULOSKELETAL: No swelling, clubbing, or edema. Range of motion full in all extremities.  NEUROLOGIC: Cranial nerves II through XII are intact. No gross focal neurological deficits. Sensation intact. Reflexes intact.  SKIN: No ulceration, lesions, rashes, or cyanosis. Skin warm and dry. Turgor intact.  PSYCHIATRIC: Mood, affect within normal limits. The patient is awake, alert and oriented x 3. Insight, judgment intact.      LABORATORY PANEL:   CBC  Recent Labs Lab 09/19/16 0631  WBC 21.5*  HGB 12.1  HCT 37.4  PLT 394   ------------------------------------------------------------------------------------------------------------------  Chemistries   Recent Labs Lab 09/19/16 0631  NA 139  K 4.1  CL 103  CO2 29  GLUCOSE 104*  BUN 21*  CREATININE 0.62  CALCIUM 9.3   ------------------------------------------------------------------------------------------------------------------  Cardiac Enzymes  Recent Labs Lab 09/19/16 2256  TROPONINI 0.44*   ------------------------------------------------------------------------------------------------------------------  RADIOLOGY:  Dg Chest Portable 1 View  Result Date: 09/19/2016 CLINICAL DATA:  Pt states that for about 2 weeks now she has been feeling very SOB, finally went to doctor this week sometime and was given an antibiotic. They said she needed to rest her body until  Friday and she went  back to work Friday and now the breathing has increased. Hx of asthma and heart murmur. Hx of HH and HTN. Nonsmoker. EXAM: PORTABLE CHEST 1 VIEW COMPARISON:  12/17/2014 FINDINGS: Cardiac silhouette is normal in size. Moderate to large hiatal hernia stable. No mediastinal or hilar masses or convincing adenopathy. Lungs are mildly hyperexpanded but clear. No pleural effusion. No pneumothorax. The bony thorax is intact. IMPRESSION: 1. No acute cardiopulmonary disease. Electronically Signed   By: Amie Portland M.D.   On: 09/19/2016 07:37    EKG:   Orders placed or performed during the hospital encounter of 09/19/16  . ED EKG  . ED EKG  . EKG 12-Lead  . EKG 12-Lead    ASSESSMENT AND PLAN:   Michelle Barnett is a 55 y.o. female presenting with Shortness of Breath and Wheezing . Admitted 09/19/2016 : Day #: 1 day  1. COPD exacerbation: Continue with breathing treatments steroids also on oxygen 2. Positive blood culture: Will add Levaquin for pneumonia coverage 3. Elevated troponin: Downwards trend can stop anticoagulation 4. Essential hypertension: Continue medications 5. GERD without esophagitis PPI Therapy   All the records are reviewed and case discussed with Care Management/Social Workerr. Management plans discussed with the patient, family and they are in agreement.  CODE STATUS: full TOTAL TIME TAKING CARE OF THIS PATIENT: 33 minutes.   POSSIBLE D/C IN 1-2DAYS, DEPENDING ON CLINICAL CONDITION.   Hower,  Mardi Mainland.D on 09/20/2016 at 11:53 AM  Between 7am to 6pm - Pager - [phone]  After 6pm: House Pager: - [phone]  [facility]  Office  [phone]  CC: Primary care physician; Ruthe Mannan, MD

## 2016-09-20 NOTE — Consult Note (Signed)
Patient ID: Cheri GuppyCathy F Chronis MRN: [id number] DOB/AGE: [birth year] 55 y.o.  Admit date: 09/19/2016 Primary Physician Ruthe Mannanalia Aron, MD  Primary Cardiologist none   Chief Complaint  COPD exacerbation  HPI:  [name] is a [age]F with COPD and hypertension admitted with shortness of breath.  [name] was admitted 9/23 with progressive shortness of breath over the preceding two weeks.  She had seen her PCP and was started on antibiotics, steroids and breathing treatments.  When she arrived in the ED her oxygen saturation was 81% on room air.  Her blood pressure was 181/102.  She was admitted to the hospitalist service for treatment of COPD exacerbation. Cardiac enzymes were obtained and peaked at 0.78. She had an echocardiogram that revealed LVEF 55-60% with grade 1 diastolic dysfunction. Her aortic valve was possibly bicuspid, but there is no aortic stenosis. Her echo was otherwise unremarkable. Cardiology was consulted due to elevated troponin.  [name] has not noted any chest pain other than when she coughs. She denies any exertional symptoms. She feels as though her shortness of breath has improved significantly since being admitted to the hospital. She denies any lower extremity edema, orthopnea, PND, palpitations, lightheadedness, or dizziness. She has been told that she had a murmur for many years. She denies ever having an echocardiogram. In her family has any history of valvular heart disease. She does have a brother recently underwent PCI and several aunts who have required pacemakers.   Review of Systems: A 12 point review of systems was obtained and was negative with exceptions as noted in history of present illness.   Past Medical History:  Diagnosis Date  . Bronchitis   . Heart murmur   . Hiatal hernia   . History of blood transfusion   . Hypertension   . Ulcer     Facility-Administered Medications Prior to Admission  Medication Dose Route Frequency Provider Last  Rate Last Dose  . albuterol (PROVENTIL) (2.5 MG/3ML) 0.083% nebulizer solution 2.5 mg  2.5 mg Nebulization Once Emi Belfasteborah B Gessner, FNP      . ipratropium (ATROVENT) nebulizer solution 0.5 mg  0.5 mg Nebulization Once Emi Belfasteborah B Gessner, FNP       Medications Prior to Admission  Medication Sig Dispense Refill  . albuterol (PROVENTIL) (2.5 MG/3ML) 0.083% nebulizer solution Take 2.5 mg by nebulization every 2 (two) hours as needed for wheezing or shortness of breath.    Marland Kitchen. albuterol (VENTOLIN HFA) 108 (90 Base) MCG/ACT inhaler INHALE 2 PUFFS INTO THE LUNGS EVERY 6 (SIX) HOURS AS NEEDED FOR WHEEZING OR SHORTNESS OF BREATH. 18 Inhaler 1  . Ascorbic Acid (VITAMIN C) 1000 MG tablet Take 1,000 mg by mouth 2 (two) times daily.     Marland Kitchen. azithromycin (ZITHROMAX) 250 MG tablet Take two tablets today then one a day until finished 6 tablet 0  . chlorpheniramine-HYDROcodone (TUSSIONEX PENNKINETIC ER) 10-8 MG/5ML SUER Take 5 mLs by mouth every 12 (twelve) hours as needed for cough. 115 mL 0  . ferrous sulfate 325 (65 FE) MG tablet Take 325 mg by mouth 2 (two) times daily.     . folic acid (FOLVITE) 800 MCG tablet Take 800 mcg by mouth daily.     Marland Kitchen. lisinopril-hydrochlorothiazide (PRINZIDE,ZESTORETIC) 10-12.5 MG tablet Take 1 tablet by mouth daily. 90 tablet 3  . magnesium oxide (MAG-OX) 400 MG tablet Take 400 mg by mouth daily.    . Multiple Vitamins-Calcium (ONE-A-DAY WOMENS PO) Take 1 tablet by mouth daily.    .Marland Kitchen  Omega-3 Fatty Acids (FISH OIL) 1200 MG CAPS Take 1 capsule by mouth daily.     . pantoprazole (PROTONIX) 40 MG tablet Take 1 tablet (40 mg total) by mouth daily at 6 (six) AM. 30 tablet 1  . predniSONE (DELTASONE) 20 MG tablet Take 3 tablets x 3 days, then 2 tablets x 3 days, then 1 tablet x 3 days. 18 tablet 0  . vitamin B-12 (CYANOCOBALAMIN) 1000 MCG tablet Take 1,000 mcg by mouth daily.       Marland Kitchen enoxaparin (LOVENOX) injection  1 mg/kg Subcutaneous Q12H  . ferrous sulfate  325 mg Oral BID WC  . folic  acid  1,000 mcg Oral Daily  . lisinopril  10 mg Oral Daily   Or  . hydrochlorothiazide  12.5 mg Oral Daily  . ipratropium-albuterol  3 mL Nebulization Q4H  . magnesium oxide  400 mg Oral Daily  . methylPREDNISolone (SOLU-MEDROL) injection  60 mg Intravenous Q24H  . omega-3 acid ethyl esters  1 g Oral Daily  . pantoprazole  40 mg Oral Q0600  . vitamin B-12  1,000 mcg Oral Daily  . vitamin C  1,000 mg Oral BID    Infusions:    Allergies  Allergen Reactions  . Aspirin Anaphylaxis  . Dairy Aid [Lactase] Swelling and Other (See Comments)    Any dairy products  . Benadryl [Diphenhydramine] Palpitations  . Penicillins Other (See Comments)    Reaction: Unknown    Social History   Social History  . Marital status: Single    Spouse name: N/A  . Number of children: N/A  . Years of education: N/A   Occupational History  . Not on file.   Social History Main Topics  . Smoking status: Never Smoker  . Smokeless tobacco: Never Used  . Alcohol use No  . Drug use: No  . Sexual activity: No   Other Topics Concern  . Not on file   Social History Narrative  . No narrative on file    Family History  Problem Relation Age of Onset  . Stroke Mother   . Hypertension Mother   . Hypertension Father   . Heart disease Father   . Hypertension Brother   . Breast cancer Maternal Aunt     PHYSICAL EXAM: Vitals:   09/20/16 0415 09/20/16 0623  BP: 124/68   Pulse: 84 67  Resp: 14   Temp: 97.5 F (36.4 C)      Intake/Output Summary (Last 24 hours) at 09/20/16 1055 Last data filed at 09/20/16 1023  Gross per 24 hour  Intake             1010 ml  Output             2025 ml  Net            -1015 ml    General:  Well appearing. No respiratory difficulty HEENT: normal Neck: supple. no JVD. Carotids 2+ bilat; no bruits. No lymphadenopathy or thryomegaly appreciated. Cor: PMI nondisplaced. Regular rate & rhythm. No rubs, gallops III/VI systolic murmur loudest at the LUSB but heard  throughout the precordium.  When asked the nurses there are "rhinophyma nursing station  Lungs: Mild expiratory wheezing.  Otherwise clear.  Abdomen: soft, nontender, nondistended. No hepatosplenomegaly. No bruits or masses. Good bowel sounds. Extremities: no cyanosis, clubbing, rash, edema Neuro: alert & oriented x 3, cranial nerves grossly intact. moves all 4 extremities w/o difficulty. Affect pleasant.  Results for orders placed or performed during  the hospital encounter of 09/19/16 (from the past 24 hour(s))  Troponin I (q 6hr x 3)     Status: Abnormal   Collection Time: 09/19/16  4:24 PM  Result Value Ref Range   Troponin I 0.64 (HH) <0.03 ng/mL  Troponin I (q 6hr x 3)     Status: Abnormal   Collection Time: 09/19/16 10:56 PM  Result Value Ref Range   Troponin I 0.44 (HH) <0.03 ng/mL   Dg Chest Portable 1 View  Result Date: 09/19/2016 CLINICAL DATA:  Pt states that for about 2 weeks now she has been feeling very SOB, finally went to doctor this week sometime and was given an antibiotic. They said she needed to rest her body until Friday and she went back to work Friday and now the breathing has increased. Hx of asthma and heart murmur. Hx of HH and HTN. Nonsmoker. EXAM: PORTABLE CHEST 1 VIEW COMPARISON:  12/17/2014 FINDINGS: Cardiac silhouette is normal in size. Moderate to large hiatal hernia stable. No mediastinal or hilar masses or convincing adenopathy. Lungs are mildly hyperexpanded but clear. No pleural effusion. No pneumothorax. The bony thorax is intact. IMPRESSION: 1. No acute cardiopulmonary disease. Electronically Signed   By: Amie Portland M.D.   On: 09/19/2016 07:37    ECG: Sinus rhythm. Rate 96 bpm. Prior anteroseptal infarct.  LAFB.  Deep anterior TWI concerning for ischemia.   Telemetry: no events   ASSESSMENT/PLAN:   # Demand ischemia: Ms. Macrae' troponin is elevated.  However, she has not had any exertional chest pain. Her EKG is concerning for prior anterior  infarct and she has deep anterior T-wave inversions which can be seen with ischemia.  It seems most likely that her cardiac enzymes are elevated in the setting of hypoxia and respiratory distress. She doesn't have any symptoms of ischemia. 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  She will need outpatient evaluation with likely stress testing.  LDL was 105 12/2015.  # Murmur: # Bicuspid aortic valve:  Ms. Minier has a prominent systolic murmur on exam.  Her echo was notable for a likely bicuspid aortic valve. There was some calcification and thickening but no aortic stenosis. This will need to be monitored as an outpatient.  # Hypertension: Blood pressure is well-controlled on lisinopril and HCTZ.  Signed: Stesha Neyens C. Duke Salvia, MD, [facility]  09/20/2016, 10:55 AM

## 2016-09-20 NOTE — Progress Notes (Signed)
Dr at bedside wants to take 02 off talking to her about elevated trop and believes it is resp related.plan of care rt tx and discharge in am

## 2016-09-20 NOTE — Progress Notes (Signed)
Patient slept through the night with no signs of SOB or acute distress noted or reported. Patient has been on 2 l/min nasal cannula, O2 saturation 97%. Normal sinus rhythm during the night, mid 80's. After PRN benzonatate cap. was given patient reported significant decrease in coughing. Will continue to monitor.

## 2016-09-20 NOTE — Progress Notes (Signed)
Pt still with cough Pneumonia shot ordered

## 2016-09-20 NOTE — Progress Notes (Signed)
PHARMACY - PHYSICIAN COMMUNICATION CRITICAL VALUE ALERT - BLOOD CULTURE IDENTIFICATION (BCID)  Results for orders placed or performed during the hospital encounter of 09/19/16  Blood Culture ID Panel (Reflexed) (Collected: 09/19/2016  9:56 AM)  Result Value Ref Range   Enterococcus species NOT DETECTED NOT DETECTED   Listeria monocytogenes NOT DETECTED NOT DETECTED   Staphylococcus species NOT DETECTED NOT DETECTED   Staphylococcus aureus NOT DETECTED NOT DETECTED   Streptococcus species NOT DETECTED NOT DETECTED   Streptococcus agalactiae NOT DETECTED NOT DETECTED   Streptococcus pneumoniae NOT DETECTED NOT DETECTED   Streptococcus pyogenes NOT DETECTED NOT DETECTED   Acinetobacter baumannii NOT DETECTED NOT DETECTED   Enterobacteriaceae species NOT DETECTED NOT DETECTED   Enterobacter cloacae complex NOT DETECTED NOT DETECTED   Escherichia coli NOT DETECTED NOT DETECTED   Klebsiella oxytoca NOT DETECTED NOT DETECTED   Klebsiella pneumoniae NOT DETECTED NOT DETECTED   Proteus species NOT DETECTED NOT DETECTED   Serratia marcescens NOT DETECTED NOT DETECTED   Haemophilus influenzae NOT DETECTED NOT DETECTED   Neisseria meningitidis NOT DETECTED NOT DETECTED   Pseudomonas aeruginosa NOT DETECTED NOT DETECTED   Candida albicans NOT DETECTED NOT DETECTED   Candida glabrata NOT DETECTED NOT DETECTED   Candida krusei NOT DETECTED NOT DETECTED   Candida parapsilosis NOT DETECTED NOT DETECTED   Candida tropicalis NOT DETECTED NOT DETECTED   9/24: BCX: gram variable cocci  Name of physician (or Provider) Contacted: Dr. Clint GuyHower  Changes to prescribed antibiotics required: Levofloxacin 750mg  IV Q24h added  Delsa BernKelly m Kathrene Sinopoli, PharmD 09/20/2016  12:38 PM

## 2016-09-20 NOTE — Progress Notes (Signed)
Pharmacy Antibiotic Note  Michelle GuppyCathy F Barnett is a 55 y.o. female admitted on 09/19/2016 with bacteremia and COPD.  Pharmacy has been consulted for levofloxacin dosing.  Plan: Lab results were discussed with Dr. Clint GuyHower and the patient's antibiotics will be changed to levofloxacin. Further narrowing will be determined based on finalized susceptibilities.  Patient received 2 doses of azithromycin. Will order levofloxacin 750mg  IV Q24h for tonight, as patient received one dose of azithromycin this morning.   Height: 4\' 8"  (142.2 cm) Weight: 134 lb 3.2 oz (60.9 kg) IBW/kg (Calculated) : 36.3  Temp (24hrs), Avg:98.1 F (36.7 C), Min:97.5 F (36.4 C), Max:98.6 F (37 C)   Recent Labs Lab 09/19/16 0631  WBC 21.5*  CREATININE 0.62    Estimated Creatinine Clearance: 57.1 mL/min (by C-G formula based on SCr of 0.62 mg/dL).    Allergies  Allergen Reactions  . Aspirin Anaphylaxis  . Dairy Aid [Lactase] Swelling and Other (See Comments)    Any dairy products  . Benadryl [Diphenhydramine] Palpitations  . Penicillins Other (See Comments)    Reaction: Unknown    Antimicrobials this admission: Azithromycin 9/23 >> 9/24 Levofloxacin 9/24 >>    Microbiology results: 9/24 BCx: gram variable cocci 9/24 BCID: negative   Thank you for allowing pharmacy to be a part of this patient's care.  Delsa BernKelly m Horald Birky, PharmD 09/20/2016 11:17 AM

## 2016-09-20 NOTE — Progress Notes (Signed)
Eating am meal still coughing. She wants to get tup to chair after eating. She is alert and commented on the alarm being placed on her bed last night.

## 2016-09-21 ENCOUNTER — Encounter
Admission: EM | Disposition: A | Discharge status: Home / Self Care | Payer: Self-pay | Source: Home / Self Care | Attending: Internal Medicine

## 2016-09-21 ENCOUNTER — Telehealth: Payer: Self-pay

## 2016-09-21 DIAGNOSIS — J449 Chronic obstructive pulmonary disease, unspecified: Secondary | ICD-10-CM

## 2016-09-21 DIAGNOSIS — I214 Non-ST elevation (NSTEMI) myocardial infarction: Secondary | ICD-10-CM

## 2016-09-21 HISTORY — PX: CARDIAC CATHETERIZATION: SHX172

## 2016-09-21 LAB — BASIC METABOLIC PANEL
Anion gap: 9 (ref 5–15)
BUN: 22 mg/dL — AB (ref 6–20)
CO2: 30 mmol/L (ref 22–32)
Calcium: 9.4 mg/dL (ref 8.9–10.3)
Chloride: 100 mmol/L — ABNORMAL LOW (ref 101–111)
Creatinine, Ser: 0.61 mg/dL (ref 0.44–1.00)
GFR calc Af Amer: >60 mL/min (ref >60)
GLUCOSE: 190 mg/dL — AB (ref 65–99)
POTASSIUM: 3.4 mmol/L — AB (ref 3.5–5.1)
Sodium: 139 mmol/L (ref 135–145)

## 2016-09-21 LAB — CBC
HCT: 40 % (ref 35.0–47.0)
Hemoglobin: 13.2 g/dL (ref 12.0–16.0)
MCH: 29 pg (ref 26.0–34.0)
MCHC: 33 g/dL (ref 32.0–36.0)
MCV: 87.9 fL (ref 80.0–100.0)
PLATELETS: 406 10*3/uL (ref 150–440)
RBC: 4.55 MIL/uL (ref 3.80–5.20)
RDW: 15.7 % — AB (ref 11.5–14.5)
WBC: 16.7 10*3/uL — ABNORMAL HIGH (ref 3.6–11.0)

## 2016-09-21 SURGERY — LEFT HEART CATH AND CORONARY ANGIOGRAPHY
Anesthesia: Moderate Sedation

## 2016-09-21 MED ORDER — SODIUM CHLORIDE 0.9 % WEIGHT BASED INFUSION
1.0000 mL/kg/h | INTRAVENOUS | Status: DC
Start: 2016-09-22 — End: 2016-09-21

## 2016-09-21 MED ORDER — SODIUM CHLORIDE 0.9 % IV SOLN: 250.0000 mL | INTRAVENOUS | Status: DC | PRN

## 2016-09-21 MED ORDER — SODIUM CHLORIDE 0.9% FLUSH
3.0000 mL | Freq: Two times a day (BID) | INTRAVENOUS | Status: DC
  Administered 2016-09-21 (×2): 3 mL via INTRAVENOUS

## 2016-09-21 MED ORDER — HEPARIN (PORCINE) IN NACL 2-0.9 UNIT/ML-% IJ SOLN
INTRAMUSCULAR | Status: AC
  Filled 2016-09-21: qty 500

## 2016-09-21 MED ORDER — VERAPAMIL HCL 2.5 MG/ML IV SOLN
INTRAVENOUS | Status: AC
  Filled 2016-09-21: qty 2

## 2016-09-21 MED ORDER — SODIUM CHLORIDE 0.9 % WEIGHT BASED INFUSION: 1.0000 mL/kg/h | INTRAVENOUS | Status: AC

## 2016-09-21 MED ORDER — SODIUM CHLORIDE 0.9% FLUSH: 3.0000 mL | INTRAVENOUS | Status: DC | PRN

## 2016-09-21 MED ORDER — FENTANYL CITRATE (PF) 100 MCG/2ML IJ SOLN
INTRAMUSCULAR | Status: AC
  Filled 2016-09-21: qty 2

## 2016-09-21 MED ORDER — IOPAMIDOL (ISOVUE-300) INJECTION 61%
INTRAVENOUS | Status: DC | PRN
  Administered 2016-09-21: 55 mL via INTRA_ARTERIAL

## 2016-09-21 MED ORDER — HEPARIN SODIUM (PORCINE) 1000 UNIT/ML IJ SOLN
INTRAMUSCULAR | Status: AC
  Filled 2016-09-21: qty 1

## 2016-09-21 MED ORDER — VERAPAMIL HCL 2.5 MG/ML IV SOLN
INTRAVENOUS | Status: DC | PRN
  Administered 2016-09-21: 2.5 mg via INTRA_ARTERIAL

## 2016-09-21 MED ORDER — SODIUM CHLORIDE 0.9% FLUSH: 3.0000 mL | Freq: Two times a day (BID) | INTRAVENOUS | Status: DC

## 2016-09-21 MED ORDER — SODIUM CHLORIDE 0.9 % WEIGHT BASED INFUSION
3.0000 mL/kg/h | INTRAVENOUS | Status: DC
Start: 2016-09-22 — End: 2016-09-21

## 2016-09-21 MED ORDER — MIDAZOLAM HCL 2 MG/2ML IJ SOLN
INTRAMUSCULAR | Status: AC
  Filled 2016-09-21: qty 2

## 2016-09-21 MED ORDER — HEPARIN SODIUM (PORCINE) 1000 UNIT/ML IJ SOLN
INTRAMUSCULAR | Status: DC | PRN
  Administered 2016-09-21: 3000 [IU] via INTRAVENOUS

## 2016-09-21 MED ORDER — MIDAZOLAM HCL 2 MG/2ML IJ SOLN
INTRAMUSCULAR | Status: DC | PRN
  Administered 2016-09-21: 1 mg via INTRAVENOUS

## 2016-09-21 MED ORDER — PREDNISONE 20 MG PO TABS
40.0000 mg | ORAL_TABLET | Freq: Every day | ORAL | Status: DC
  Administered 2016-09-21 – 2016-09-22 (×2): 40 mg via ORAL
  Filled 2016-09-21 (×2): qty 2

## 2016-09-21 MED ORDER — FENTANYL CITRATE (PF) 100 MCG/2ML IJ SOLN
INTRAMUSCULAR | Status: DC | PRN
  Administered 2016-09-21: 25 ug via INTRAVENOUS

## 2016-09-21 SURGICAL SUPPLY — 10 items
CATH 5F 110X4 TIG (CATHETERS) ×3 IMPLANT
CATH 5FR PIGTAIL DIAGNOSTIC (CATHETERS) ×3 IMPLANT
CATH INFINITI 5 FR LCB (CATHETERS) IMPLANT
DEVICE RAD TR BAND REGULAR (VASCULAR PRODUCTS) ×3 IMPLANT
GLIDESHEATH SLEND SS 6F .021 (SHEATH) ×3 IMPLANT
KIT MANI 3VAL PERCEP (MISCELLANEOUS) ×3 IMPLANT
PACK CARDIAC CATH (CUSTOM PROCEDURE TRAY) ×3 IMPLANT
WIRE EMERALD 3MM-J .035X150CM (WIRE) IMPLANT
WIRE HITORQ VERSACORE ST 145CM (WIRE) ×3 IMPLANT
WIRE SAFE-T 1.5MM-J .035X260CM (WIRE) ×3 IMPLANT

## 2016-09-21 NOTE — Progress Notes (Signed)
Inpatient Diabetes Program Recommendations  AACE/ADA: New Consensus Statement on Inpatient Glycemic Control (2015)  Target Ranges:  Prepandial:   less than 140 mg/dL      Peak postprandial:   less than 180 mg/dL (1-2 hours)      Critically ill patients:  140 - 180 mg/dL   Results for Michelle GuppyCHAMBERS, Michelle Barnett (MRN [id number]) as of 09/21/2016 10:09  Ref. Range 09/19/2016 06:31 09/21/2016 08:31  Glucose Latest Ref Range: 65 - 99 mg/dL 409104 (H) 811190 (H)   Review of Glycemic Control  Diabetes history: No Outpatient Diabetes medications: NA Current orders for Inpatient glycemic control: None  Inpatient Diabetes Program Recommendations Correction (SSI): While inpatient and ordered steroids, please consider ordering CBGs with Novolog correction scale.  Thanks, Orlando PennerMarie Rashon Westrup, RN, MSN, CDE Diabetes Coordinator Inpatient Diabetes Program [phone] (Team Pager from 8am to 5pm) [phone] ([facility] office) [phone] [facility] office) [phone] [facility] office)

## 2016-09-21 NOTE — Telephone Encounter (Signed)
PLEASE NOTE: All timestamps contained within this report are represented as [place]. CONFIDENTIALTY NOTICE: This fax transmission is intended only for the addressee. It contains information that is legally privileged, confidential or otherwise protected from use or disclosure. If you are not the intended recipient, you are strictly prohibited from reviewing, disclosing, copying using or disseminating any of this information or taking any action in reliance on or regarding this information. If you have received this fax in error, please notify us immediately by telephone so that we can arrange for its return to us. Phone: [phone], Toll-Free: [phone], Fax: [phone] Page: 1 of 2 Call Id: [id number] [facility] - Client TELEPHONE ADVICE RECORD [facility] Patient Name: Michelle RamsayCATHY Housel Gender: Female DOB: 1961/02/08 Age: 55 Y 5 M 24 D Return Phone Number: [phone] (Primary) Address: City/State/Zip: [place] Client [facility] - [facility] Physician Ruthe MannanAron, Talia - MD Contact Type Call Who Is Calling Patient / Member / Family / Caregiver Call Type Triage / Clinical Relationship To Patient Self Return Phone Number [phone] (Primary) Chief Complaint BREATHING - shortness of breath or sounds breathless Reason for Call Symptomatic / Request for Health Information Initial Comment Caller states she was in the office on 9/20 and prescribed a nebulizer. She has been taking all her medications as prescribed but is still waking up with a lot of chest congestion, coughing and difficulity breathing. PreDisposition Did not know what to do Translation No Nurse Assessment Nurse: Phebe CollaVandenberg, RN, Dondra SpryGail Date/Time Lamount Cohen(Eastern Time): 09/19/2016 5:32:56 AM Confirm and document reason for call. If symptomatic, describe symptoms. You must click the next button to  save text entered. ---Caller states she was in the office on 9/20 and prescribed a nebulizer. She has been taking all her medications as prescribed but is still waking up with a lot of chest congestion, coughing and difficulty breathing. no fever. using inhalers every 2 hrs. on a z pack, steroid and cough syrup. mild pneumonia Has the patient traveled out of the country within the last 30 days? ---Not Applicable Does the patient have any new or worsening symptoms? ---Yes Will a triage be completed? ---Yes Related visit to physician within the last 2 weeks? ---Yes Does the PT have any chronic conditions? (i.e. diabetes, asthma, etc.) ---Yes List chronic conditions. ---htn Is this a behavioral health or substance abuse call? ---No Guidelines Guideline Title Affirmed Question Affirmed Notes Nurse Date/Time (Eastern Time) Pneumonia on Antibiotic Post-Hospitalization Follow-up Call MODERATE difficulty breathing (e.g., speaks in phrases, SOB even at rest, pulse 100-120) Phebe CollaVandenberg, Francisca DecemberRN, Gail 09/19/2016 5:37:02 AM PLEASE NOTE: All timestamps contained within this report are represented as [place]. CONFIDENTIALTY NOTICE: This fax transmission is intended only for the addressee. It contains information that is legally privileged, confidential or otherwise protected from use or disclosure. If you are not the intended recipient, you are strictly prohibited from reviewing, disclosing, copying using or disseminating any of this information or taking any action in reliance on or regarding this information. If you have received this fax in error, please notify us immediately by telephone so that we can arrange for its return to us. Phone: [phone], Toll-Free: [phone], Fax: [phone] Page: 2 of 2 Call Id: [id number] Disp. Time Lamount Cohen(Eastern Time) Disposition Final User 09/19/2016 5:27:02 AM Send to Urgent Valere DrossQueue Wood, Amy 09/19/2016 5:39:08 AM Go to ED Now (or PCP triage) Yes  Phebe CollaVandenberg, RN, Suzi RootsGail Caller Understands: Yes  Disagree/Comply: Comply Care Advice Given Per Guideline GO TO ED NOW (OR PCP TRIAGE): * IF NO PCP TRIAGE: You need to be seen. Go to the [facility] Hospital within the next hour. Leave as soon as you can. DRIVING: Another adult should drive. CARE ADVICE given per Pneumonia on Antibiotic Post-Hospitalization Follow-Up Call (Adult) guideline. Referrals [facility] - ED

## 2016-09-21 NOTE — Progress Notes (Signed)
DR Kirke CorinArida was informed about pt's HR in the 130;s while she was having breathing treatment ,

## 2016-09-21 NOTE — Progress Notes (Addendum)
Pt. Slept throughout the night with no c/o pain, SOB or acute distress noted. Pt. Continued to have dry cough throughout the night, PRN meds given with effective results for a while. Will continue to monitor pt.

## 2016-09-21 NOTE — Progress Notes (Signed)
2cc's of air removed from right TR band 

## 2016-09-21 NOTE — Telephone Encounter (Signed)
Per chart review tab pt was admitted to [facility] on 09/19/16.

## 2016-09-21 NOTE — Progress Notes (Signed)
[facility] [place] [facility]   PATIENT NAME: Michelle RamsayCathy Barnett    MRN#:  [id number]  DATE OF BIRTH:  27-May-1961  SUBJECTIVE:  Hospital Day: 2 days Michelle Barnett is a 55 y.o. female presenting with Shortness of Breath and Wheezing .   Overnight events: No acute overnight events Interval Events: breathing improvedNo further complaints  REVIEW OF SYSTEMS:  CONSTITUTIONAL: No fever, fatigue or weakness.  EYES: No blurred or double vision.  EARS, NOSE, AND THROAT: No tinnitus or ear pain.  RESPIRATORY: Positive cough, improved shortness of breath, denies wheezing or hemoptysis.  CARDIOVASCULAR: No chest pain, orthopnea, edema.  GASTROINTESTINAL: No nausea, vomiting, diarrhea or abdominal pain.  GENITOURINARY: No dysuria, hematuria.  ENDOCRINE: No polyuria, nocturia,  HEMATOLOGY: No anemia, easy bruising or bleeding SKIN: No rash or lesion. MUSCULOSKELETAL: No joint pain or arthritis.   NEUROLOGIC: No tingling, numbness, weakness.  PSYCHIATRY: No anxiety or depression.   DRUG ALLERGIES:   Allergies  Allergen Reactions  . Aspirin Anaphylaxis  . Dairy Aid [Lactase] Swelling and Other (See Comments)    Any dairy products  . Benadryl [Diphenhydramine] Palpitations  . Penicillins Other (See Comments)    Reaction: Unknown    VITALS:  Blood pressure (!) 149/85, pulse (!) 108, temperature 98.4 F (36.9 C), temperature source Oral, resp. rate (!) 22, height 4\' 8"  (1.422 m), weight 60.9 kg (134 lb 3.2 oz), SpO2 94 %.  PHYSICAL EXAMINATION:  VITAL SIGNS: Vitals:   09/21/16 1145 09/21/16 1148  BP:  (!) 149/85  Pulse: (!) 106 (!) 108  Resp: 18 (!) 22  Temp:     GENERAL:55 y.o.female currently in no acute distress.  HEAD: Normocephalic, atraumatic.  EYES: Pupils equal, round, reactive to light. Extraocular muscles intact. No scleral icterus.  MOUTH: Moist mucosal membrane. Dentition intact. No abscess noted.  EAR, NOSE, THROAT: Clear without  exudates. No external lesions.  NECK: Supple. No thyromegaly. No nodules. No JVD.  PULMONARY: Diminished breath sounds left side without wheeze rails or rhonci. No use of accessory muscles, Good respiratory effort. good air entry bilaterally CHEST: Nontender to palpation.  CARDIOVASCULAR: S1 and S2. Regular rate and rhythm. No murmurs, rubs, or gallops. No edema. Pedal pulses 2+ bilaterally.  GASTROINTESTINAL: Soft, nontender, nondistended. No masses. Positive bowel sounds. No hepatosplenomegaly.  MUSCULOSKELETAL: No swelling, clubbing, or edema. Range of motion full in all extremities.  NEUROLOGIC: Cranial nerves II through XII are intact. No gross focal neurological deficits. Sensation intact. Reflexes intact.  SKIN: No ulceration, lesions, rashes, or cyanosis. Skin warm and dry. Turgor intact.  PSYCHIATRIC: Mood, affect within normal limits. The patient is awake, alert and oriented x 3. Insight, judgment intact.      LABORATORY PANEL:   CBC  Recent Labs Lab 09/21/16 0831  WBC 16.7*  HGB 13.2  HCT 40.0  PLT 406   ------------------------------------------------------------------------------------------------------------------  Chemistries   Recent Labs Lab 09/21/16 0831  NA 139  K 3.4*  CL 100*  CO2 30  GLUCOSE 190*  BUN 22*  CREATININE 0.61  CALCIUM 9.4   ------------------------------------------------------------------------------------------------------------------  Cardiac Enzymes  Recent Labs Lab 09/19/16 2256  TROPONINI 0.44*   ------------------------------------------------------------------------------------------------------------------  RADIOLOGY:  No results found.  EKG:   Orders placed or performed during the hospital encounter of 09/19/16  . ED EKG  . ED EKG  . EKG 12-Lead  . EKG 12-Lead    ASSESSMENT AND PLAN:   Michelle Barnett is a 55 y.o. female presenting with Shortness of Breath and  Wheezing . Admitted 09/19/2016 : Day #: 2  days  1. COPD exacerbation: Continue with breathing treatments steroidsWeaned to room air 2. Positive blood culture:  Levaquin for pneumonia coverage, Follow cultures, remains afebrile  3. Elevated troponin: Cardiology input appreciated for cardiac catheterization today  4. Essential hypertension: Continue medications 5. GERD without esophagitis PPI Therapy  Disposition: Likely discharge in the morning if catheterization goes well   All the records are reviewed and case discussed with Care Management/Social Workerr. Management plans discussed with the patient, family and they are in agreement.  CODE STATUS: full TOTAL TIME TAKING CARE OF THIS PATIENT: 28 minutes.   POSSIBLE D/C IN 1-2DAYS, DEPENDING ON CLINICAL CONDITION.   Edna Rede,  Mardi Mainland.D on 09/21/2016 at 12:02 PM  Between 7am to 6pm - Pager - [phone]  After 6pm: House Pager: - [phone]  [facility]  Office  [phone]  CC: Primary care physician; Ruthe Mannan, MD

## 2016-09-21 NOTE — Progress Notes (Signed)
Right radial TR band removed and 4x4/tegaderm dressing aplied.  No c/o from patient and no distress noted.

## 2016-09-21 NOTE — Progress Notes (Signed)
2cc's of air removed from right radial TR band.  No bleeding noted from site.

## 2016-09-21 NOTE — Progress Notes (Signed)
3cc's of air removed from right TR band.  No bleeding noted from site.

## 2016-09-21 NOTE — Progress Notes (Signed)
3cc's of air removed from right TR band.  Site without any bleeding.  No c/o from patient and no distress noted.

## 2016-09-21 NOTE — Progress Notes (Signed)
SUBJECTIVE:  She denies chest pain. She continues to have shortness of breath and cough which has improved over the weekend. She is getting a breathing treatment and currently tachycardic.   Vitals:   09/20/16 1118 09/20/16 2021 09/21/16 0434 09/21/16 0810  BP: 120/65 134/69 (!) 118/59 (!) 143/77  Pulse: 96 (!) 106 94 (!) 107  Resp: 14 18 16 18   Temp: 98 F (36.7 C) 98.6 F (37 C) 98.3 F (36.8 C) 98.5 F (36.9 C)  TempSrc: Oral Oral Oral Oral  SpO2: 92% 92% 94% 93%  Weight:      Height:        Intake/Output Summary (Last 24 hours) at 09/21/16 0904 Last data filed at 09/20/16 2307  Gross per 24 hour  Intake              360 ml  Output              500 ml  Net             -140 ml    LABS: Basic Metabolic Panel:  Recent Labs  69/62/9509/23/17 0631  NA 139  K 4.1  CL 103  CO2 29  GLUCOSE 104*  BUN 21*  CREATININE 0.62  CALCIUM 9.3   Liver Function Tests: No results for input(s): AST, ALT, ALKPHOS, BILITOT, PROT, ALBUMIN in the last 72 hours. No results for input(s): LIPASE, AMYLASE in the last 72 hours. CBC:  Recent Labs  09/19/16 0631 09/21/16 0831  WBC 21.5* 16.7*  NEUTROABS 18.9*  --   HGB 12.1 13.2  HCT 37.4 40.0  MCV 89.6 87.9  PLT 394 406   Cardiac Enzymes:  Recent Labs  09/19/16 1003 09/19/16 1624 09/19/16 2256  TROPONINI 0.78* 0.64* 0.44*   BNP: Invalid input(s): POCBNP D-Dimer: No results for input(s): DDIMER in the last 72 hours. Hemoglobin A1C: No results for input(s): HGBA1C in the last 72 hours. Fasting Lipid Panel: No results for input(s): CHOL, HDL, LDLCALC, TRIG, CHOLHDL, LDLDIRECT in the last 72 hours. Thyroid Function Tests: No results for input(s): TSH, T4TOTAL, T3FREE, THYROIDAB in the last 72 hours.  Invalid input(s): FREET3 Anemia Panel: No results for input(s): VITAMINB12, FOLATE, FERRITIN, TIBC, IRON, RETICCTPCT in the last 72 hours.   PHYSICAL EXAM General: Well developed, well nourished, in no acute  distress HEENT:  Normocephalic and atramatic Neck:  No JVD.  Lungs: Clear bilaterally to auscultation and percussion. Heart: HRRR, Tachycardic . Normal S1 and S2 without gallops . There is a 3/6 crescendo decrescendo systolic murmur in the aortic area which is mid peaking. Abdomen: Bowel sounds are positive, abdomen soft and non-tender  Msk:  Back normal, normal gait. Normal strength and tone for age. Extremities: No clubbing, cyanosis or edema.   Neuro: Alert and oriented X 3. Psych:  Good affect, responds appropriately  TELEMETRY: Reviewed telemetry pt in sinus tachycardia:  ASSESSMENT AND PLAN:  1. Non-ST elevation myocardial infarction:  the patient's EKG changes are very concerning for LAD disease and these changes are new when compared to the EKG from August. Thus, I recommend proceeding with cardiac catheterization and possible coronary intervention. I discussed the procedure in details as well as risks and benefits.   2. COPD with possible bronchitis. Symptoms are improving. One positive blood culture but repeat was negative. The patient is afebrile.  3. Possible bicuspid aortic valve with moderate stenosis by physical exam. Evaluate the gradient by cardiac cath.  Lorine BearsMuhammad Arida, MD, [facility] 09/21/2016 9:04 AM

## 2016-09-22 ENCOUNTER — Ambulatory Visit: Payer: BLUE CROSS/BLUE SHIELD | Admitting: Gastroenterology

## 2016-09-22 ENCOUNTER — Telehealth: Payer: Self-pay | Admitting: Cardiology

## 2016-09-22 MED ORDER — BENZONATATE 200 MG PO CAPS: 200.0000 mg | ORAL_CAPSULE | Freq: Three times a day (TID) | ORAL | 0 refills | Status: DC | PRN

## 2016-09-22 MED ORDER — PREDNISONE 10 MG (21) PO TBPK: ORAL_TABLET | ORAL | 0 refills | Status: DC

## 2016-09-22 MED ORDER — LEVOFLOXACIN 500 MG PO TABS: 500.0000 mg | ORAL_TABLET | Freq: Every day | ORAL | 0 refills | Status: AC

## 2016-09-22 MED ORDER — GUAIFENESIN-CODEINE 100-10 MG/5ML PO SOLN: 10.0000 mL | ORAL | 0 refills | Status: DC | PRN

## 2016-09-22 NOTE — Progress Notes (Signed)
Patient: Michelle Barnett / Admit Date: 09/19/2016 / Date of Encounter: 09/22/2016, 9:24 AM   Subjective:  Patient reports having some blurred vision episodes starting yesterday afternoon, waxing and waning Had one episode at 1:30 in the morning lasting 5-10 minutes, One episode at 5:30 in the morning lasting 5 minutes Has not had any further episodes since that time Describes it as a blurring in her eyes like someone changed the "dial on the TV" Anxious this morning, tearful, She feels vision issue is secondary to all the medicines she received yesterday including pneumonia shot, steroids, antibiotics, anesthesia for catheterization Currently feeling well with no complaints  Review of Systems: Review of Systems  Constitutional: Negative.   Eyes: Positive for blurred vision.  Respiratory: Negative.   Cardiovascular: Negative.   Gastrointestinal: Negative.   Musculoskeletal: Negative.   Neurological: Negative.   Psychiatric/Behavioral: Negative.   All other systems reviewed and are negative.  All other systems reviewed and negative.   Objective: Telemetry:  Physical Exam: Blood pressure 139/87, pulse 95, temperature 98.2 F (36.8 C), temperature source Oral, resp. rate 16, height 4\' 8"  (1.422 m), weight 134 lb 3.2 oz (60.9 kg), SpO2 95 %. Body mass index is 30.09 kg/m. General: Well developed, well nourished, in no acute distress. Head: Normocephalic, atraumatic, sclera non-icteric, no xanthomas, nares are without discharge. Neck: Negative for carotid bruits. JVP not elevated. Lungs: Clear bilaterally to auscultation without wheezes, rales, or rhonchi. Breathing is unlabored. Heart: RRR S1 S2 with 3+murmur SEM RSB, no rubs, or gallops.  Abdomen: Soft, non-tender, non-distended with normoactive bowel sounds. No rebound/guarding. Extremities: No clubbing or cyanosis. No edema. Distal pedal pulses are 2+ and equal bilaterally. Neuro: Alert and oriented X 3. Moves all  extremities spontaneously. Psych:  Responds to questions appropriately with a normal affect.   Intake/Output Summary (Last 24 hours) at 09/22/16 0924 Last data filed at 09/21/16 2120  Gross per 24 hour  Intake             1173 ml  Output              550 ml  Net              623 ml    Inpatient Medications:  . ferrous sulfate  325 mg Oral BID WC  . folic acid  1,000 mcg Oral Daily  . lisinopril  10 mg Oral Daily   Or  . hydrochlorothiazide  12.5 mg Oral Daily  . ipratropium-albuterol  3 mL Nebulization Q4H  . levofloxacin (LEVAQUIN) IV  750 mg Intravenous Q24H  . magnesium oxide  400 mg Oral Daily  . omega-3 acid ethyl esters  1 g Oral Daily  . pantoprazole  40 mg Oral Q0600  . predniSONE  40 mg Oral Q breakfast  . sodium chloride flush  3 mL Intravenous Q12H  . vitamin B-12  1,000 mcg Oral Daily  . vitamin C  1,000 mg Oral BID   Infusions:    Labs:  Recent Labs  09/21/16 0831  NA 139  K 3.4*  CL 100*  CO2 30  GLUCOSE 190*  BUN 22*  CREATININE 0.61  CALCIUM 9.4   No results for input(s): AST, ALT, ALKPHOS, BILITOT, PROT, ALBUMIN in the last 72 hours.  Recent Labs  09/21/16 0831  WBC 16.7*  HGB 13.2  HCT 40.0  MCV 87.9  PLT 406    Recent Labs  09/19/16 1003 09/19/16 1624 09/19/16 2256  TROPONINI 0.78* 0.64* 0.44*  Invalid input(s): POCBNP No results for input(s): HGBA1C in the last 72 hours.   Weights: Filed Weights   09/19/16 0624 09/19/16 1019  Weight: 132 lb (59.9 kg) 134 lb 3.2 oz (60.9 kg)     Radiology/Studies:  Dg Chest Portable 1 View  Result Date: 09/19/2016 CLINICAL DATA:  Pt states that for about 2 weeks now she has been feeling very SOB, finally went to doctor this week sometime and was given an antibiotic. They said she needed to rest her body until Friday and she went back to work Friday and now the breathing has increased. Hx of asthma and heart murmur. Hx of HH and HTN. Nonsmoker. EXAM: PORTABLE CHEST 1 VIEW COMPARISON:   12/17/2014 FINDINGS: Cardiac silhouette is normal in size. Moderate to large hiatal hernia stable. No mediastinal or hilar masses or convincing adenopathy. Lungs are mildly hyperexpanded but clear. No pleural effusion. No pneumothorax. The bony thorax is intact. IMPRESSION: 1. No acute cardiopulmonary disease. Electronically Signed   By: Amie Portlandavid  Ormond M.D.   On: 09/19/2016 07:37     Assessment and Plan  55 y.o. female   presenting with Shortness of Breath and Wheezing . Admitted 09/19/2016 : Day #: 3 day  1. COPD exacerbation:  Continue with breathing treatments steroids Weaned to room air Appears much more comfortable May need prednisone taper, outpatient antibiotics  2. Positive blood culture:   Levaquin for pneumonia coverage,   3. Elevated troponin:  Cardiac catheterization yesterday with nonobstructive disease  4. Essential hypertension:  Continue medications  5. GERD without esophagitis  PPI Therapy  6. Blurred vision Rare episodes, improving, likely does not need further workup such as head CT scan Last episode 5:30 in the morning lasting for 5 minutes, episode prior to that at 1:30 in the morning Would monitor for now. If no further episodes through today, could consider is charging later today  Long discussion with patient about the events of yesterday, and about her vision and returning to work, restrictions following catheterization Long discussion concerning returning to work, Should be okay to go back to work this coming Saturday 5 days from now She is indicated that she will need a work note to return on Saturday   Total encounter time more than 35 minutes  Greater than 50% was spent in counseling and coordination of care with the patient   Signed, Dossie Arbourim Thetis Schwimmer, MD, Ph.D. [facility] 09/22/2016, 9:24 AM

## 2016-09-22 NOTE — Telephone Encounter (Signed)
TCM... Pt saw Chilton Siiffany [place] in hospital  Coming to see us on 09/29/16

## 2016-09-22 NOTE — Progress Notes (Signed)
   [facility] [address] [place], [place] [address]  September 22, 2016  Patient:  Michelle RamsayCathy Kasa Date of Birth: Oct 20, 1961 Date of Visit:  09/19/2016  To Whom it May Concern:  Please excuse Cheri GuppyCathy F Seal from work from 09/19/2016 until 09/22/16 as she was [facility] and has been receiving appropriate care. She may return to work on 09/26/16, sooner if she feels she is able to return sooner than this date with weight restrictions no more than 5 lbs right wrist.     Please don't hesitate to contact me with questions or concerns by calling  [phone] and asking them to page me directly.   Marge Duncansave Hower, MD

## 2016-09-22 NOTE — Progress Notes (Signed)
Pt. Discharged to home via wc. Discharge instructions and medication regimen reviewed at bedside with patient. Pt. verbalizes understanding of instructions and medication regimen. Prescription for cough syrup included with d/c papers, all other prescriptions sent to pharmacy- pt aware. Patient assessment unchanged from this morning. TELE and IV discontinued per policy.

## 2016-09-22 NOTE — Progress Notes (Deleted)
Cardiology Office Note   Date:  09/22/2016   ID:  Michelle Barnett, DOB Jun 27, 1961, MRN [id number]  Referring Doctor:  Ruthe Mannan, MD   Cardiologist:   Almond Lint, MD   Reason for consultation:  No chief complaint on file.     History of Present Illness: Michelle Barnett is a 55 y.o. female who presents for ***   ROS:  Please see the history of present illness. Aside from mentioned under HPI, all other systems are reviewed and negative.     Past Medical History:  Diagnosis Date  . Asthma   . Bicuspid aortic valve 09/20/2016  . Bronchitis   . Demand ischemia (HCC) 09/20/2016  . Heart murmur   . Hiatal hernia   . History of blood transfusion   . Hypertension   . Ulcer     Past Surgical History:  Procedure Laterality Date  . abdominal tumor    . ABLATION    . CARDIAC CATHETERIZATION N/A 09/21/2016   Procedure: Left Heart Cath and Coronary Angiography;  Surgeon: Iran Ouch, MD;  Location: [facility] INVASIVE CV LAB;  Service: Cardiovascular;  Laterality: N/A;  . COLONOSCOPY N/A 08/14/2016   Procedure: COLONOSCOPY;  Surgeon: Sherrilyn Rist, MD;  Location: [facility] ENDOSCOPY;  Service: Endoscopy;  Laterality: N/A;  . ESOPHAGOGASTRODUODENOSCOPY N/A 08/13/2016   Procedure: ESOPHAGOGASTRODUODENOSCOPY (EGD);  Surgeon: Sherrilyn Rist, MD;  Location: [facility] ENDOSCOPY;  Service: Gastroenterology;  Laterality: N/A;  . TOOTH EXTRACTION       reports that she has never smoked. She has never used smokeless tobacco. She reports that she does not drink alcohol or use drugs.   family history includes Breast cancer in her maternal aunt; Heart disease in her father; Hypertension in her brother, father, and mother; Stroke in her mother.   Outpatient Medications Prior to Visit  Medication Sig Dispense Refill  . albuterol (PROVENTIL) (2.5 MG/3ML) 0.083% nebulizer solution Take 2.5 mg by nebulization every 2 (two) hours as needed for wheezing or shortness of breath.    Marland Kitchen albuterol  (VENTOLIN HFA) 108 (90 Base) MCG/ACT inhaler INHALE 2 PUFFS INTO THE LUNGS EVERY 6 (SIX) HOURS AS NEEDED FOR WHEEZING OR SHORTNESS OF BREATH. 18 Inhaler 1  . Ascorbic Acid (VITAMIN C) 1000 MG tablet Take 1,000 mg by mouth 2 (two) times daily.     . benzonatate (TESSALON) 200 MG capsule Take 1 capsule (200 mg total) by mouth 3 (three) times daily as needed for cough. 20 capsule 0  . ferrous sulfate 325 (65 FE) MG tablet Take 325 mg by mouth 2 (two) times daily.     . folic acid (FOLVITE) 800 MCG tablet Take 800 mcg by mouth daily.     Marland Kitchen guaiFENesin-codeine 100-10 MG/5ML syrup Take 10 mLs by mouth every 4 (four) hours as needed for cough. 120 mL 0  . levofloxacin (LEVAQUIN) 500 MG tablet Take 1 tablet (500 mg total) by mouth daily. 5 tablet 0  . lisinopril-hydrochlorothiazide (PRINZIDE,ZESTORETIC) 10-12.5 MG tablet Take 1 tablet by mouth daily. 90 tablet 3  . magnesium oxide (MAG-OX) 400 MG tablet Take 400 mg by mouth daily.    . Multiple Vitamins-Calcium (ONE-A-DAY WOMENS PO) Take 1 tablet by mouth daily.    . Omega-3 Fatty Acids (FISH OIL) 1200 MG CAPS Take 1 capsule by mouth daily.     . pantoprazole (PROTONIX) 40 MG tablet Take 1 tablet (40 mg total) by mouth daily at 6 (six) AM. 30 tablet 1  .  predniSONE (STERAPRED UNI-PAK 21 TAB) 10 MG (21) TBPK tablet 40mg  x1 day, 20mg  x2 day, 10mg  x2 day then stop 10 tablet 0  . vitamin B-12 (CYANOCOBALAMIN) 1000 MCG tablet Take 1,000 mcg by mouth daily.     Facility-Administered Medications Prior to Visit  Medication Dose Route Frequency Provider Last Rate Last Dose  . 0.9 %  sodium chloride infusion  250 mL Intravenous PRN Iran Ouch, MD      . acetaminophen (TYLENOL) tablet 650 mg  650 mg Oral Q6H PRN Wyatt Haste, MD       Or  . acetaminophen (TYLENOL) suppository 650 mg  650 mg Rectal Q6H PRN Wyatt Haste, MD      . albuterol (PROVENTIL) (2.5 MG/3ML) 0.083% nebulizer solution 2.5 mg  2.5 mg Nebulization Once Emi Belfast, FNP      .  benzonatate (TESSALON) capsule 200 mg  200 mg Oral TID PRN Oralia Manis, MD   200 mg at 09/21/16 2136  . ferrous sulfate tablet 325 mg  325 mg Oral BID WC Wyatt Haste, MD   325 mg at 09/22/16 0830  . folic acid (FOLVITE) tablet 1 mg  1,000 mcg Oral Daily Wyatt Haste, MD   1 mg at 09/22/16 0831  . guaiFENesin-codeine 100-10 MG/5ML solution 10 mL  10 mL Oral Q4H PRN Wyatt Haste, MD   5 mL at 09/20/16 2133  . hydrALAZINE (APRESOLINE) injection 10 mg  10 mg Intravenous Q4H PRN Wyatt Haste, MD      . lisinopril (PRINIVIL,ZESTRIL) tablet 10 mg  10 mg Oral Daily Wyatt Haste, MD   10 mg at 09/22/16 0830   Or  . hydrochlorothiazide (MICROZIDE) capsule 12.5 mg  12.5 mg Oral Daily Wyatt Haste, MD   12.5 mg at 09/20/16 1006  . ipratropium (ATROVENT) nebulizer solution 0.5 mg  0.5 mg Nebulization Once Emi Belfast, FNP      . ipratropium-albuterol (DUONEB) 0.5-2.5 (3) MG/3ML nebulizer solution 3 mL  3 mL Nebulization Q4H Wyatt Haste, MD   3 mL at 09/22/16 0724  . levofloxacin (LEVAQUIN) IVPB 750 mg  750 mg Intravenous Q24H Wyatt Haste, MD   750 mg at 09/21/16 2124  . magnesium oxide (MAG-OX) tablet 400 mg  400 mg Oral Daily Wyatt Haste, MD   400 mg at 09/22/16 0831  . omega-3 acid ethyl esters (LOVAZA) capsule 1 g  1 g Oral Daily Wyatt Haste, MD   1 g at 09/22/16 0831  . ondansetron (ZOFRAN) tablet 4 mg  4 mg Oral Q6H PRN Wyatt Haste, MD       Or  . ondansetron [facility]) injection 4 mg  4 mg Intravenous Q6H PRN Wyatt Haste, MD      . oxyCODONE (Oxy IR/ROXICODONE) immediate release tablet 5 mg  5 mg Oral Q4H PRN Wyatt Haste, MD      . pantoprazole (PROTONIX) EC tablet 40 mg  40 mg Oral Q0600 Wyatt Haste, MD   40 mg at 09/22/16 4782  . predniSONE (DELTASONE) tablet 40 mg  40 mg Oral Q breakfast Wyatt Haste, MD   40 mg at 09/22/16 0830  . sodium chloride flush (NS) 0.9 % injection 3 mL  3 mL Intravenous Q12H Iran Ouch, MD   3 mL at 09/21/16 2120  . sodium chloride flush  (NS) 0.9 % injection 3 mL  3 mL Intravenous PRN Iran Ouch, MD      .  vitamin B-12 (CYANOCOBALAMIN) tablet 1,000 mcg  1,000 mcg Oral Daily Wyatt Haste, MD   1,000 mcg at 09/22/16 0830  . vitamin C (ASCORBIC ACID) tablet 1,000 mg  1,000 mg Oral BID Wyatt Haste, MD   1,000 mg at 09/22/16 0831     Allergies: Aspirin; Dairy aid [lactase]; Benadryl [diphenhydramine]; and Penicillins    PHYSICAL EXAM: VS:  There were no vitals taken for this visit. , There is no height or weight on file to calculate BMI. Wt Readings from Last 3 Encounters:  09/19/16 134 lb 3.2 oz (60.9 kg)  09/16/16 132 lb (59.9 kg)  08/14/16 136 lb (61.7 kg)    GENERAL:  well developed, well nourished, *** obese, not in acute distress HEENT: normocephalic, pink conjunctivae, anicteric sclerae, no xanthelasma, normal dentition, oropharynx clear NECK:  no neck vein engorgement, JVP normal, no hepatojugular reflux, carotid upstroke brisk and symmetric, no bruit, no thyromegaly, no lymphadenopathy LUNGS:  good respiratory effort, clear to auscultation bilaterally CV:  PMI not displaced, no thrills, no lifts, S1 and S2 within normal limits, no palpable S3 or S4, no murmurs, no rubs, no gallops ABD:  Soft, nontender, nondistended, normoactive bowel sounds, no abdominal aortic bruit, no hepatomegaly, no splenomegaly MS: nontender back, no kyphosis, no scoliosis, no joint deformities EXT:  2+ DP/PT pulses, no edema, no varicosities, no cyanosis, no clubbing SKIN: warm, nondiaphoretic, normal turgor, no ulcers NEUROPSYCH: alert, oriented to person, place, and time, sensory/motor grossly intact, normal mood, appropriate affect  Recent Labs: 06/16/2016: TSH 1.67 08/11/2016: ALT 14 09/21/2016: BUN 22; Creatinine, Ser 0.61; Hemoglobin 13.2; Platelets 406; Potassium 3.4; Sodium 139   Lipid Panel    Component Value Date/Time   CHOL 194 06/16/2016 0815   TRIG 38.0 06/16/2016 0815   HDL 66.70 06/16/2016 0815   CHOLHDL 3  06/16/2016 0815   VLDL 7.6 06/16/2016 0815   LDLCALC 120 (H) 06/16/2016 0815     Other studies Reviewed:  EKG:  The ekg from *** was personally reviewed by me and it revealed ***  Additional studies/ records that were reviewed personally reviewed by me today include:  Echo 09/19/2016: Left ventricle: The cavity size was normal. Systolic function was   normal. The estimated ejection fraction was in the range of 55%   to 60%. Wall motion was normal; there were no regional wall   motion abnormalities. Doppler parameters are consistent with   abnormal left ventricular relaxation (grade 1 diastolic   dysfunction). - Aortic valve: A bicuspid morphology cannot be excluded; mildly   thickened, mildly calcified leaflets. - Mitral valve: Transvalvular velocity was within the normal range.   There was no evidence for stenosis. There was mild regurgitation. - Left atrium: The atrium was severely dilated. - Right ventricle: The cavity size was normal. Wall thickness was   normal. Systolic function was normal. - Atrial septum: No defect or patent foramen ovale was identified   by color flow Doppler. - Tricuspid valve: There was trivial regurgitation.  Heart cath 09/21/2016:  Ost LM to LM lesion, 20 %stenosed.  There is no aortic valve stenosis.   1. Mild ostial left main stenosis due to slight kinking of the vessel. Otherwise no evidence of obstructive coronary artery disease. 2. Normal left ventricular end-diastolic pressure with minimal gradient across the aortic valve. Normal ejection fraction by echo.  Recommendations: Elevated troponin seems to be due to supply demand ischemia. The exact etiology of her significant EKG changes is not entirely clear but likely due  to myocardial stress from underlying lung disease. No further testing needed from a cardiac standpoint. She should follow-up with us on a routine basis for possible bicuspid aortic valve.    ASSESSMENT AND  PLAN:    Current medicines are reviewed at length with the patient today.  The patient {ACTIONS; HAS/DOES NOT HAVE:19233} concerns regarding medicines.  Labs/ tests ordered today include: No orders of the defined types were placed in this encounter.   I had a lengthy and detailed discussion with the patient regarding diagnoses, prognosis, diagnostic options, treatment options ***, and side effects of medications.   I counseled the patient on importance of lifestyle modification including heart healthy diet, regular physical activity *** , and smoking cessation.   Disposition:   FU with undersigned after tests ***   Signed, Almond LintAileen Dina Mobley, MD  09/22/2016 12:54 PM    [facility]  This note was generated in part with voice recognition software and I apologize for any typographical errors that were not detected and corrected.

## 2016-09-22 NOTE — Telephone Encounter (Signed)
[facility] 09[facility]22/[facility].  Patient understands to follow up with provider Dr. Alvino ChapelIngal on 09/29/2016 at 10:15AM at [facility] . Patient understands discharge instructions? Yes Patient understands medications and regiment? Yes Patient understands to bring all medications to this visit? Yes  Patient verbalized that she is feeling some better and had no further questions at this time.

## 2016-09-22 NOTE — Care Management (Signed)
Informed by attending that there are no discharge need.  Patient is on room air and not recurring any oxygen.  has requested a note for work

## 2016-09-22 NOTE — Discharge Instructions (Signed)
Angiogram An angiogram is an X-ray test. It is used to look at your blood vessels. For this test, a dye is put into the blood vessel being checked. The dye shows up on X-rays. It helps your doctor see if there is a blockage or other problem in the blood vessel. BEFORE THE PROCEDURE  Follow your doctor's instructions about limiting what you eat or drink.  Ask your doctor if you may drink enough water to take any needed medicines the morning of the test.  Plan to have someone take you home after the test.  If you go home the same day as the test, plan to have someone stay with you for 24 hours. PROCEDURE   An IV tube will be put into one of your veins.  You will be given a medicine that makes you relax (sedative).  Your skin will be washed and shaved where the thin tube (catheter) will be inserted. This will usually be done in the upper part of your leg (groin). It may also be done in your arm near the elbow or in your wrist.  You will be given a medicine that numbs the area where the tube will be inserted (local anesthetic).  The tube will be inserted into a blood vessel.  Using a type of X-ray (fluoroscopy) to see, your doctor will move the tube into the blood vessel to check it.  Dye will be put in through the tube. X-rays of your blood vessels will then be taken. Different health care providers and hospitals may do this procedure differently. AFTER THE PROCEDURE   If the test is done through the leg, you will be kept in bed lying flat for several hours. You will be told to not bend or cross your legs.   The area where the tube was inserted will be checked often.   The pulse in your feet or wrist will be checked often.   More tests or X-rays may be done.    This information is not intended to replace advice given to you by your health care provider. Make sure you discuss any questions you have with your health care provider.   Document Released: 03/12/2009 Document  Revised: 01/04/2015 Document Reviewed: 05/17/2013 Elsevier Interactive Patient Education 2016 [employer]  Angiogram, Care After Refer to this sheet in the next few weeks. These instructions provide you with information about caring for yourself after your procedure. Your health care provider may also give you more specific instructions. Your treatment has been planned according to current medical practices, but problems sometimes occur. Call your health care provider if you have any problems or questions after your procedure. WHAT TO EXPECT AFTER THE PROCEDURE After your procedure, it is typical to have the following:  Bruising at the catheter insertion site that usually fades within 1-2 weeks.  Blood collecting in the tissue (hematoma) that may be painful to the touch. It should usually decrease in size and tenderness within 1-2 weeks. HOME CARE INSTRUCTIONS  Take medicines only as directed by your health care provider.  You may shower 24-48 hours after the procedure or as directed by your health care provider. Remove the bandage (dressing) and gently wash the site with plain soap and water. Pat the area dry with a clean towel. Do not rub the site, because this may cause bleeding.  Do not take baths, swim, or use a hot tub until your health care provider approves.  Check your insertion site every day for redness, swelling,  or drainage.  Do not apply powder or lotion to the site.  Do not lift over 10 lb (4.5 kg) for 5 days after your procedure or as directed by your health care provider.  Ask your health care provider when it is okay to:  Return to work or school.  Resume usual physical activities or sports.  Resume sexual activity.  Do not drive home if you are discharged the same day as the procedure. Have someone else drive you.  You may drive 24 hours after the procedure unless otherwise instructed by your health care provider.  Do not operate machinery or power tools for  24 hours after the procedure or as directed by your health care provider.  If your procedure was done as an outpatient procedure, which means that you went home the same day as your procedure, a responsible adult should be with you for the first 24 hours after you arrive home.  Keep all follow-up visits as directed by your health care provider. This is important. SEEK MEDICAL CARE IF:  You have a fever.  You have chills.  You have increased bleeding from the catheter insertion site. Hold pressure on the site. SEEK IMMEDIATE MEDICAL CARE IF:  You have unusual pain at the catheter insertion site.  You have redness, warmth, or swelling at the catheter insertion site.  You have drainage (other than a small amount of blood on the dressing) from the catheter insertion site.  The catheter insertion site is bleeding, and the bleeding does not stop after 30 minutes of holding steady pressure on the site.  The area near or just beyond the catheter insertion site becomes pale, cool, tingly, or numb.   This information is not intended to replace advice given to you by your health care provider. Make sure you discuss any questions you have with your health care provider.   Document Released: 07/02/2005 Document Revised: 01/04/2015 Document Reviewed: 05/17/2013 Elsevier Interactive Patient Education 2016 [employer]  Levofloxacin tablets What is this medicine? LEVOFLOXACIN (lee voe FLOX a sin) is a quinolone antibiotic. It is used to treat certain kinds of bacterial infections. It will not work for colds, flu, or other viral infections. This medicine may be used for other purposes; ask your health care provider or pharmacist if you have questions. What should I tell my health care provider before I take this medicine? They need to know if you have any of these conditions: -bone problems -cerebral disease -history of low levels of potassium in the blood -irregular heartbeat -joint  problems -kidney disease -myasthenia gravis -seizures -tendon problems -tingling of the fingers or toes, or other nerve disorder -an unusual or allergic reaction to levofloxacin, other quinolone antibiotics, foods, dyes, or preservatives -pregnant or trying to get pregnant -breast-feeding How should I use this medicine? Take this medicine by mouth with a full glass of water. Follow the directions on the prescription label. This medicine can be taken with or without food. Take your medicine at regular intervals. Do not take your medicine more often than directed. Do not skip doses or stop your medicine early even if you feel better. Do not stop taking except on your doctor's advice. A special MedGuide will be given to you by the pharmacist with each prescription and refill. Be sure to read this information carefully each time. Talk to your pediatrician regarding the use of this medicine in children. While this drug may be prescribed for children as young as 6 months for selected conditions,  precautions do apply. Overdosage: If you think you have taken too much of this medicine contact a poison control center or emergency room at once. NOTE: This medicine is only for you. Do not share this medicine with others. What if I miss a dose? If you miss a dose, take it as soon as you remember. If it is almost time for your next dose, take only that dose. Do not take double or extra doses. What may interact with this medicine? Do not take this medicine with any of the following medications: -arsenic trioxide -chloroquine -droperidol -medicines for irregular heart rhythm like amiodarone, disopyramide, dofetilide, flecainide, quinidine, procainamide, sotalol -some medicines for depression or mental problems like phenothiazines, pimozide, and ziprasidone This medicine may also interact with the following medications: -amoxapine -antacids -birth control pills -cisapride -dairy products -didanosine  (ddI) buffered tablets or powder -haloperidol -multivitamins -NSAIDS, medicines for pain and inflammation, like ibuprofen or naproxen -retinoid products like tretinoin or isotretinoin -risperidone -some other antibiotics like clarithromycin or erythromycin -sucralfate -theophylline -warfarin This list may not describe all possible interactions. Give your health care provider a list of all the medicines, herbs, non-prescription drugs, or dietary supplements you use. Also tell them if you smoke, drink alcohol, or use illegal drugs. Some items may interact with your medicine. What should I watch for while using this medicine? Tell your doctor or health care professional if your symptoms do not improve or if they get worse. Drink several glasses of water a day and cut down on drinks that contain caffeine. You must not get dehydrated while taking this medicine. You may get drowsy or dizzy. Do not drive, use machinery, or do anything that needs mental alertness until you know how this medicine affects you. Do not sit or stand up quickly, especially if you are an older patient. This reduces the risk of dizzy or fainting spells. This medicine can make you more sensitive to the sun. Keep out of the sun. If you cannot avoid being in the sun, wear protective clothing and use a sunscreen. Do not use sun lamps or tanning beds/booths. Contact your doctor if you get a sunburn. If you are a diabetic monitor your blood glucose carefully. If you get an unusual reading stop taking this medicine and call your doctor right away. Do not treat diarrhea with over-the-counter products. Contact your doctor if you have diarrhea that lasts more than 2 days or if the diarrhea is severe and watery. Avoid antacids, calcium, iron, and zinc products for 2 hours before and 2 hours after taking a dose of this medicine. What side effects may I notice from receiving this medicine? Side effects that you should report to your doctor or  health care professional as soon as possible: -allergic reactions like skin rash or hives, swelling of the face, lips, or tongue -anxious -confusion -depressed mood -diarrhea -fast, irregular heartbeat -hallucination, loss of contact with reality -joint, muscle, or tendon pain or swelling -pain, tingling, numbness in the hands or feet -suicidal thoughts or other mood changes -sunburn -unusually weak or tired Side effects that usually do not require medical attention (report to your doctor or health care professional if they continue or are bothersome): -dry mouth -headache -nausea -trouble sleeping This list may not describe all possible side effects. Call your doctor for medical advice about side effects. You may report side effects to FDA at 1-800-FDA-1088. Where should I keep my medicine? Keep out of the reach of children. Store at room temperature between 15  and 30 degrees C (59 and 86 degrees F). Keep in a tightly closed container. Throw away any unused medicine after the expiration date. NOTE: This sheet is a summary. It may not cover all possible information. If you have questions about this medicine, talk to your doctor, pharmacist, or health care provider.    2016, Elsevier/Gold Standard. (2015-07-25 12:40:18)  Codeine; Guaifenesin oral solution or syrup What is this medicine? CODEINE; GUAIFENESIN (KOE deen; gwye FEN e sin) is a cough suppressant and expectorant. It helps to stop or reduce coughing due to the common cold or inhaled irritants. It will not treat an infection. This medicine may be used for other purposes; ask your health care provider or pharmacist if you have questions. What should I tell my health care provider before I take this medicine? They need to know if you have any of these conditions: -Addison's disease -convulsions -drug or alcohol abuse or addiction -enlarged prostate -fever -intestinal or stomach problems -kidney disease -liver  disease -lung disease like asthma or emphysema -recent surgery or injury -thyroid disease -an allergic or unusual reaction to codeine, hydromorphone, hydrocodone, oxycodone, morphine, guaifenesin, other medicines, foods, dyes, or preservatives -pregnant or trying to get pregnant -breast-feeding How should I use this medicine? Take this medicine by mouth with a full glass of water. Follow the directions on the prescription label. Use a specially marked spoon or container to measure your medicine. Ask your pharmacist if you do not have one. Household spoons are not accurate. Take this medicine with food or milk if it upsets your stomach. Take your doses at regular times. Do not take more medicine than directed. Talk to your pediatrician regarding the use of this medicine in children. Special care may be needed. Overdosage: If you think you have taken too much of this medicine contact a poison control center or emergency room at once. NOTE: This medicine is only for you. Do not share this medicine with others. What if I miss a dose? If you miss a dose, take it as soon as you can. If it is almost time for your next dose, take only that dose. Do not take double or extra doses. What may interact with this medicine? -alcohol -antihistamines for allergy, cough and cold -certain medicines for depression, anxiety, or psychotic disturbances -certain medicines for sleep -MAOIs like Carbex, Eldepryl, Marplan, Nardil, and Parnate -muscle relaxants -narcotic medicines (opiates) for pain -tramadol This list may not describe all possible interactions. Give your health care provider a list of all the medicines, herbs, non-prescription drugs, or dietary supplements you use. Also tell them if you smoke, drink alcohol, or use illegal drugs. Some items may interact with your medicine. What should I watch for while using this medicine? You may develop tolerance to this medicine if you take it for a long time.  Tolerance means that you will get less cough relief with time. Tell your doctor or health care professional if your symptoms do not improve or if they get worse. If you have a high fever, skin rash, or headache, see your health care professional. Do not suddenly stop taking your medicine because you may develop a severe reaction. Your body becomes used to the medicine. This does NOT mean you are addicted. Addiction is a behavior related to getting and using a drug for a non-medical reason. If your doctor wants you to stop the medicine, the dose will be slowly lowered over time to avoid any side effects. Drink several glasses of water each  day. You may get drowsy or dizzy. Do not drive, use machinery, or do anything that needs mental alertness until you know how this medicine affects you. Do not stand or sit up quickly, especially if you are an older patient. This reduces the risk of dizzy or fainting spells. Alcohol may interfere with the effect of this medicine. Avoid alcoholic drinks. Children may be at higher risk for side effects. If your child has slow breathing, noisy breathing, confusion, or unusual sleepiness, stop giving this medicine and get medical help right away. The medicine may cause constipation. Try to have a bowel movement at least every 2 to 3 days. If you do not have a bowel movement for 3 days, call your doctor or health care professional. What side effects may I notice from receiving this medicine? Side effects that you should report to your doctor or health care professional as soon as possible: -allergic reactions like skin rash, itching or hives, swelling of the face, lips, or tongue -breathing problems -confusion -hallucinations -irregular heartbeat -seizures -trouble passing urine Side effects that usually do not require medical attention (report to your doctor or health care professional if they continue or are bothersome): -blurred vision -constipation -flushing or  sweating -headache -stomach upset, nausea This list may not describe all possible side effects. Call your doctor for medical advice about side effects. You may report side effects to FDA at 1-800-FDA-1088. Where should I keep my medicine? Keep out of the reach of children. This medicine can be abused. Keep your medicine in a safe place to protect it from theft. Do not share this medicine with anyone. Selling or giving away this medicine is dangerous and against the law. This medicine may cause accidental overdose and death if taken by other adults, children, or pets. Mix any unused medicine with a substance like cat littler or coffee grounds. Then throw the medicine away in a sealed container like a sealed bag or a coffee can with a lid. Do not use the medicine after the expiration date. Store at room temperature between 15 and 30 degrees C (59 and 86 degrees F). Protect from light. NOTE: This sheet is a summary. It may not cover all possible information. If you have questions about this medicine, talk to your doctor, pharmacist, or health care provider.    2016, Elsevier/Gold Standard. (2014-08-18 18:27:27)  Prednisone tablets What is this medicine? PREDNISONE (PRED ni sone) is a corticosteroid. It is commonly used to treat inflammation of the skin, joints, lungs, and other organs. Common conditions treated include asthma, allergies, and arthritis. It is also used for other conditions, such as blood disorders and diseases of the adrenal glands. This medicine may be used for other purposes; ask your health care provider or pharmacist if you have questions. What should I tell my health care provider before I take this medicine? They need to know if you have any of these conditions: -Cushing's syndrome -diabetes -glaucoma -heart disease -high blood pressure -infection (especially a virus infection such as chickenpox, cold sores, or herpes) -kidney disease -liver disease -mental  illness -myasthenia gravis -osteoporosis -seizures -stomach or intestine problems -thyroid disease -an unusual or allergic reaction to lactose, prednisone, other medicines, foods, dyes, or preservatives -pregnant or trying to get pregnant -breast-feeding How should I use this medicine? Take this medicine by mouth with a glass of water. Follow the directions on the prescription label. Take this medicine with food. If you are taking this medicine once a day, take it in  the morning. Do not take more medicine than you are told to take. Do not suddenly stop taking your medicine because you may develop a severe reaction. Your doctor will tell you how much medicine to take. If your doctor wants you to stop the medicine, the dose may be slowly lowered over time to avoid any side effects. Talk to your pediatrician regarding the use of this medicine in children. Special care may be needed. Overdosage: If you think you have taken too much of this medicine contact a poison control center or emergency room at once. NOTE: This medicine is only for you. Do not share this medicine with others. What if I miss a dose? If you miss a dose, take it as soon as you can. If it is almost time for your next dose, talk to your doctor or health care professional. You may need to miss a dose or take an extra dose. Do not take double or extra doses without advice. What may interact with this medicine? Do not take this medicine with any of the following medications: -metyrapone -mifepristone This medicine may also interact with the following medications: -aminoglutethimide -amphotericin B -aspirin and aspirin-like medicines -barbiturates -certain medicines for diabetes, like glipizide or glyburide -cholestyramine -cholinesterase inhibitors -cyclosporine -digoxin -diuretics -ephedrine -female hormones, like estrogens and birth control pills -isoniazid -ketoconazole -NSAIDS, medicines for pain and inflammation,  like ibuprofen or naproxen -phenytoin -rifampin -toxoids -vaccines -warfarin This list may not describe all possible interactions. Give your health care provider a list of all the medicines, herbs, non-prescription drugs, or dietary supplements you use. Also tell them if you smoke, drink alcohol, or use illegal drugs. Some items may interact with your medicine. What should I watch for while using this medicine? Visit your doctor or health care professional for regular checks on your progress. If you are taking this medicine over a prolonged period, carry an identification card with your name and address, the type and dose of your medicine, and your doctor's name and address. This medicine may increase your risk of getting an infection. Tell your doctor or health care professional if you are around anyone with measles or chickenpox, or if you develop sores or blisters that do not heal properly. If you are going to have surgery, tell your doctor or health care professional that you have taken this medicine within the last twelve months. Ask your doctor or health care professional about your diet. You may need to lower the amount of salt you eat. This medicine may affect blood sugar levels. If you have diabetes, check with your doctor or health care professional before you change your diet or the dose of your diabetic medicine. What side effects may I notice from receiving this medicine? Side effects that you should report to your doctor or health care professional as soon as possible: -allergic reactions like skin rash, itching or hives, swelling of the face, lips, or tongue -changes in emotions or moods -changes in vision -depressed mood -eye pain -fever or chills, cough, sore throat, pain or difficulty passing urine -increased thirst -swelling of ankles, feet Side effects that usually do not require medical attention (report to your doctor or health care professional if they continue or are  bothersome): -confusion, excitement, restlessness -headache -nausea, vomiting -skin problems, acne, thin and shiny skin -trouble sleeping -weight gain This list may not describe all possible side effects. Call your doctor for medical advice about side effects. You may report side effects to FDA at 1-800-FDA-1088. Where  should I keep my medicine? Keep out of the reach of children. Store at room temperature between 15 and 30 degrees C (59 and 86 degrees F). Protect from light. Keep container tightly closed. Throw away any unused medicine after the expiration date. NOTE: This sheet is a summary. It may not cover all possible information. If you have questions about this medicine, talk to your doctor, pharmacist, or health care provider.    2016, Elsevier/[employer] Standard. (2011-07-30 10:57:14)   Benzonatate capsules What is this medicine? BENZONATATE (ben ZOE na tate) is used to treat cough. This medicine may be used for other purposes; ask your health care provider or pharmacist if you have questions. What should I tell my health care provider before I take this medicine? They need to know if you have any of these conditions: -kidney or liver disease -an unusual or allergic reaction to benzonatate, anesthetics, other medicines, foods, dyes, or preservatives -pregnant or trying to get pregnant -breast-feeding How should I use this medicine? Take this medicine by mouth with a glass of water. Follow the directions on the prescription label. Avoid breaking, chewing, or sucking the capsule, as this can cause serious side effects. Take your medicine at regular intervals. Do not take your medicine more often than directed. Talk to your pediatrician regarding the use of this medicine in children. While this drug may be prescribed for children as young as 60 years old for selected conditions, precautions do apply. Overdosage: If you think you have taken too much of this medicine contact a poison  control center or emergency room at once. NOTE: This medicine is only for you. Do not share this medicine with others. What if I miss a dose? If you miss a dose, take it as soon as you can. If it is almost time for your next dose, take only that dose. Do not take double or extra doses. What may interact with this medicine? Do not take this medicine with any of the following medications: -MAOIs like Carbex, Eldepryl, Marplan, Nardil, and Parnate This list may not describe all possible interactions. Give your health care provider a list of all the medicines, herbs, non-prescription drugs, or dietary supplements you use. Also tell them if you smoke, drink alcohol, or use illegal drugs. Some items may interact with your medicine. What should I watch for while using this medicine? Tell your doctor if your symptoms do not improve or if they get worse. If you have a high fever, skin rash, or headache, see your health care professional. You may get drowsy or dizzy. Do not drive, use machinery, or do anything that needs mental alertness until you know how this medicine affects you. Do not sit or stand up quickly, especially if you are an older patient. This reduces the risk of dizzy or fainting spells. What side effects may I notice from receiving this medicine? Side effects that you should report to your doctor or health care professional as soon as possible: -allergic reactions like skin rash, itching or hives, swelling of the face, lips, or tongue -breathing problems -chest pain -confusion or hallucinations -irregular heartbeat -numbness of mouth or throat -seizures Side effects that usually do not require medical attention (report to your doctor or health care professional if they continue or are bothersome): -burning feeling in the eyes -constipation -headache -nasal congestion -stomach upset This list may not describe all possible side effects. Call your doctor for medical advice about side  effects. You may report side effects to FDA  at 1-800-FDA-1088. Where should I keep my medicine? Keep out of the reach of children. Store at room temperature between 15 and 30 degrees C (59 and 86 degrees F). Keep tightly closed. Protect from light and moisture. Throw away any unused medicine after the expiration date. NOTE: This sheet is a summary. It may not cover all possible information. If you have questions about this medicine, talk to your doctor, pharmacist, or health care provider.    2016, Elsevier/Gold Standard. (2008-03-14 14:52:56)

## 2016-09-22 NOTE — Discharge Summary (Signed)
[facility] [place] [facility]   PATIENT NAME: Michelle Barnett    MR#:  [id number]  DATE OF BIRTH:  October 17, 1961  DATE OF ADMISSION:  09/19/2016 ADMITTING PHYSICIAN: Wyatt Haste, MD  DATE OF DISCHARGE: 09/22/16  PRIMARY CARE PHYSICIAN: Ruthe Mannan, MD    ADMISSION DIAGNOSIS:  Leukocytosis [D72.829] Hypoxia [R09.02] COPD exacerbation (HCC) [J44.1]  DISCHARGE DIAGNOSIS:     Acute respiratory failure with hypoxia (HCC)   COPD exacerbation (HCC)   Demand ischemia (HCC)   Bicuspid aortic valve     SECONDARY DIAGNOSIS:   Past Medical History:  Diagnosis Date  . Asthma   . Bicuspid aortic valve 09/20/2016  . Bronchitis   . Demand ischemia (HCC) 09/20/2016  . Heart murmur   . Hiatal hernia   . History of blood transfusion   . Hypertension   . Ulcer     HOSPITAL COURSE:  Michelle Barnett  is a 55 y.o. female admitted 09/19/2016 with chief complaint Shortness of Breath and Wheezing . Please see H&P performed by Wyatt Haste, MD for further information. Patient presented with the above symptoms. Noted to have elevated troponins. Treated for COPD exacerbation with improvement. Gram variable blood culture positive 1/4 bottles, cardiology evaluation - underwent cath without significant disease  DISCHARGE CONDITIONS:   stable  CONSULTS OBTAINED:  Treatment Team:  Wyatt Haste, MD  DRUG ALLERGIES:   Allergies  Allergen Reactions  . Aspirin Anaphylaxis  . Dairy Aid [Lactase] Swelling and Other (See Comments)    Any dairy products  . Benadryl [Diphenhydramine] Palpitations  . Penicillins Other (See Comments)    Reaction: Unknown    DISCHARGE MEDICATIONS:   Current Discharge Medication List    START taking these medications   Details  benzonatate (TESSALON) 200 MG capsule Take 1 capsule (200 mg total) by mouth 3 (three) times daily as needed for cough. Qty: 20 capsule, Refills: 0    guaiFENesin-codeine 100-10 MG/5ML syrup Take 10 mLs by  mouth every 4 (four) hours as needed for cough. Qty: 120 mL, Refills: 0    levofloxacin (LEVAQUIN) 500 MG tablet Take 1 tablet (500 mg total) by mouth daily. Qty: 5 tablet, Refills: 0    predniSONE (STERAPRED UNI-PAK 21 TAB) 10 MG (21) TBPK tablet 40mg  x1 day, 20mg  x2 day, 10mg  x2 day then stop Qty: 10 tablet, Refills: 0      CONTINUE these medications which have NOT CHANGED   Details  albuterol (PROVENTIL) (2.5 MG/3ML) 0.083% nebulizer solution Take 2.5 mg by nebulization every 2 (two) hours as needed for wheezing or shortness of breath.    albuterol (VENTOLIN HFA) 108 (90 Base) MCG/ACT inhaler INHALE 2 PUFFS INTO THE LUNGS EVERY 6 (SIX) HOURS AS NEEDED FOR WHEEZING OR SHORTNESS OF BREATH. Qty: 18 Inhaler, Refills: 1    Ascorbic Acid (VITAMIN C) 1000 MG tablet Take 1,000 mg by mouth 2 (two) times daily.     ferrous sulfate 325 (65 FE) MG tablet Take 325 mg by mouth 2 (two) times daily.     folic acid (FOLVITE) 800 MCG tablet Take 800 mcg by mouth daily.     lisinopril-hydrochlorothiazide (PRINZIDE,ZESTORETIC) 10-12.5 MG tablet Take 1 tablet by mouth daily. Qty: 90 tablet, Refills: 3    magnesium oxide (MAG-OX) 400 MG tablet Take 400 mg by mouth daily.    Multiple Vitamins-Calcium (ONE-A-DAY WOMENS PO) Take 1 tablet by mouth daily.    Omega-3 Fatty Acids (FISH OIL) 1200 MG CAPS Take 1 capsule  by mouth daily.     pantoprazole (PROTONIX) 40 MG tablet Take 1 tablet (40 mg total) by mouth daily at 6 (six) AM. Qty: 30 tablet, Refills: 1    vitamin B-12 (CYANOCOBALAMIN) 1000 MCG tablet Take 1,000 mcg by mouth daily.      STOP taking these medications     azithromycin (ZITHROMAX) 250 MG tablet      chlorpheniramine-HYDROcodone (TUSSIONEX PENNKINETIC ER) 10-8 MG/5ML SUER      predniSONE (DELTASONE) 20 MG tablet          DISCHARGE INSTRUCTIONS:    DIET:  Regular diet  DISCHARGE CONDITION:  Stable  ACTIVITY:  Activity as tolerated  OXYGEN:  Home Oxygen: No.     Oxygen Delivery: room air  DISCHARGE LOCATION:  home   If you experience worsening of your admission symptoms, develop shortness of breath, life threatening emergency, suicidal or homicidal thoughts you must seek medical attention immediately by calling 911 or calling your MD immediately  if symptoms less severe.  You Must read complete instructions/literature along with all the possible adverse reactions/side effects for all the Medicines you take and that have been prescribed to you. Take any new Medicines after you have completely understood and accpet all the possible adverse reactions/side effects.   Please note  You were cared for by a hospitalist during your hospital stay. If you have any questions about your discharge medications or the care you received while you were in the hospital after you are discharged, you can call the unit and asked to speak with the hospitalist on call if the hospitalist that took care of you is not available. Once you are discharged, your primary care physician will handle any further medical issues. Please note that NO REFILLS for any discharge medications will be authorized once you are discharged, as it is imperative that you return to your primary care physician (or establish a relationship with a primary care physician if you do not have one) for your aftercare needs so that they can reassess your need for medications and monitor your lab values.    On the day of Discharge:   VITAL SIGNS:  Blood pressure 139/87, pulse 95, temperature 98.2 F (36.8 C), temperature source Oral, resp. rate 16, height 4\' 8"  (1.422 m), weight 60.9 kg (134 lb 3.2 oz), SpO2 95 %.  I/O:   Intake/Output Summary (Last 24 hours) at 09/22/16 0956 Last data filed at 09/21/16 2120  Gross per 24 hour  Intake             1173 ml  Output              550 ml  Net              623 ml    PHYSICAL EXAMINATION:  GENERAL:  55 y.o.-year-old patient lying in the bed with no acute  distress.  EYES: Pupils equal, round, reactive to light and accommodation. No scleral icterus. Extraocular muscles intact.  HEENT: Head atraumatic, normocephalic. Oropharynx and nasopharynx clear.  NECK:  Supple, no jugular venous distention. No thyroid enlargement, no tenderness.  LUNGS: Normal breath sounds bilaterally, no wheezing, rales,rhonchi or crepitation. No use of accessory muscles of respiration.  CARDIOVASCULAR: S1, S2 normal. No murmurs, rubs, or gallops.  ABDOMEN: Soft, non-tender, non-distended. Bowel sounds present. No organomegaly or mass.  EXTREMITIES: No pedal edema, cyanosis, or clubbing.  NEUROLOGIC: Cranial nerves II through XII are intact. Muscle strength 5/5 in all extremities. Sensation intact. Gait not checked.  PSYCHIATRIC: The patient is alert and oriented x 3.  SKIN: No obvious rash, lesion, or ulcer.   DATA REVIEW:   CBC  Recent Labs Lab 09/21/16 0831  WBC 16.7*  HGB 13.2  HCT 40.0  PLT 406    Chemistries   Recent Labs Lab 09/21/16 0831  NA 139  K 3.4*  CL 100*  CO2 30  GLUCOSE 190*  BUN 22*  CREATININE 0.61  CALCIUM 9.4    Cardiac Enzymes  Recent Labs Lab 09/19/16 2256  TROPONINI 0.44*    Microbiology Results  Results for orders placed or performed during the hospital encounter of 09/19/16  Culture, blood (routine x 2)     Status: None (Preliminary result)   Collection Time: 09/19/16  9:56 AM  Result Value Ref Range Status   Specimen Description BLOOD RIGHT HAND  Final   Special Requests BOTTLES DRAWN AEROBIC AND ANAEROBIC  5CC  Final   Culture  Setup Time   Final    Organism ID to follow GRAM VARIABLE COCCI AEROBIC BOTTLE ONLY CRITICAL RESULT CALLED TO, READ BACK BY AND VERIFIED WITH: Rella LarveKELLY FUHRMANN AT 1040 ON 09/20/16 BY [facility]    Culture   Final    GRAM VARIABLE COCCI IDENTIFICATION TO FOLLOW [facility]    Report Status PENDING  Incomplete  Blood Culture ID Panel (Reflexed)     Status: None    Collection Time: 09/19/16  9:56 AM  Result Value Ref Range Status   Enterococcus species NOT DETECTED NOT DETECTED Final   Listeria monocytogenes NOT DETECTED NOT DETECTED Final   Staphylococcus species NOT DETECTED NOT DETECTED Final   Staphylococcus aureus NOT DETECTED NOT DETECTED Final   Streptococcus species NOT DETECTED NOT DETECTED Final   Streptococcus agalactiae NOT DETECTED NOT DETECTED Final   Streptococcus pneumoniae NOT DETECTED NOT DETECTED Final   Streptococcus pyogenes NOT DETECTED NOT DETECTED Final   Acinetobacter baumannii NOT DETECTED NOT DETECTED Final   Enterobacteriaceae species NOT DETECTED NOT DETECTED Final   Enterobacter cloacae complex NOT DETECTED NOT DETECTED Final   Escherichia coli NOT DETECTED NOT DETECTED Final   Klebsiella oxytoca NOT DETECTED NOT DETECTED Final   Klebsiella pneumoniae NOT DETECTED NOT DETECTED Final   Proteus species NOT DETECTED NOT DETECTED Final   Serratia marcescens NOT DETECTED NOT DETECTED Final   Haemophilus influenzae NOT DETECTED NOT DETECTED Final   Neisseria meningitidis NOT DETECTED NOT DETECTED Final   Pseudomonas aeruginosa NOT DETECTED NOT DETECTED Final   Candida albicans NOT DETECTED NOT DETECTED Final   Candida glabrata NOT DETECTED NOT DETECTED Final   Candida krusei NOT DETECTED NOT DETECTED Final   Candida parapsilosis NOT DETECTED NOT DETECTED Final   Candida tropicalis NOT DETECTED NOT DETECTED Final  Culture, blood (routine x 2)     Status: None (Preliminary result)   Collection Time: 09/19/16 10:05 AM  Result Value Ref Range Status   Specimen Description BLOOD LEFT HAND  Final   Special Requests   Final    BOTTLES DRAWN AEROBIC AND ANAEROBIC  AER 5CC ANA 3CC   Culture NO GROWTH < 24 HOURS  Final   Report Status PENDING  Incomplete    RADIOLOGY:  No results found.   Management plans discussed with the patient, family and they are in agreement.  CODE STATUS:     Code Status Orders        Start      Ordered   09/21/16 1105  Full code  Continuous  09/21/16 1104    Code Status History    Date Active Date Inactive Code Status Order ID Comments User Context   09/19/2016  7:29 AM 09/19/2016  3:38 PM Full Code [id number]  Wyatt Haste, MD ED   08/12/2016  3:36 AM 08/14/2016  4:50 PM Full Code [id number]  Alberteen Sam, MD Inpatient      TOTAL TIME TAKING CARE OF THIS PATIENT: 33 minutes.    Hower,  Mardi Mainland.D on 09/22/2016 at 9:56 AM  Between 7am to 6pm - Pager - [phone]  After 6pm go to [email or web address] - Scientist, research (life sciences)  Hospitalists  Office  [phone]  CC: Primary care physician; Ruthe Mannan, MD

## 2016-09-22 NOTE — Progress Notes (Addendum)
Pt complaining of blurred vision and "feeling flushed". Pt states that her vision blurs in both eyes, lasting for about 10-15 minutes than it returns back to normal. VSS. Pupils are normal, and reactive. No other complaints besides blurred vision. Pt reports that this has been happening since she got back from cardiac catheterization, however the periods between each episode has gotten longer. The last episode being 3 hours prior to this episode. MD Anne HahnWillis notified. Per MD, we will continue to monitor her but if she continues to have this issue we will proceed with getting a head CT. Will continue to monitor.    Mayra NeerNesbitt, Kendrix Orman M

## 2016-09-23 ENCOUNTER — Telehealth: Payer: Self-pay

## 2016-09-23 LAB — CULTURE, BLOOD (ROUTINE X 2)

## 2016-09-23 NOTE — Telephone Encounter (Signed)
Transition Care Management Follow-up Telephone Call    Date discharged? 09/22/2016  How have you been since you were released from the hospital? Recovering.   Any patient concerns? Pt is still experiencing cough and shortness of breath. Taking medications as prescribed.    Items Reviewed:  Medications reviewed: Yes  Allergies reviewed: Yes  Dietary changes reviewed: Yes  Referrals reviewed: Yes   Functional Questionnaire:  Independent - I Dependent - D    Activities of Daily Living (ADLs):    Personal hygiene - I Dressing - I Eating - I Maintaining continence - I Transferring - I   Independent Activities of Daily Living (iADLs): Basic communication skills - I Transportation - D (pt has temporary driving restrictions) Meal preparation  - I Shopping - I Housework - I  Managing medications - I  Managing personal finances - I   Confirmed importance and date/time of follow-up visits scheduled YES  Provider Appointment booked with PCP 09/28/16 @ 1545  Confirmed with patient if condition begins to worsen call PCP or go to the ER.  Patient was given the office number and encouraged to call back with question or concerns: YES

## 2016-09-23 NOTE — Telephone Encounter (Signed)
Noted  

## 2016-09-24 LAB — CULTURE, BLOOD (ROUTINE X 2): CULTURE: NO GROWTH

## 2016-09-28 ENCOUNTER — Ambulatory Visit (INDEPENDENT_AMBULATORY_CARE_PROVIDER_SITE_OTHER): Payer: Managed Care, Other (non HMO) | Admitting: Primary Care

## 2016-09-28 ENCOUNTER — Ambulatory Visit (INDEPENDENT_AMBULATORY_CARE_PROVIDER_SITE_OTHER): Payer: Managed Care, Other (non HMO) | Admitting: Physician Assistant

## 2016-09-28 ENCOUNTER — Encounter: Payer: Self-pay | Admitting: Primary Care

## 2016-09-28 ENCOUNTER — Encounter: Payer: Self-pay | Admitting: Physician Assistant

## 2016-09-28 VITALS — BP 140/90 | HR 84 | Temp 98.0°F | Ht <= 58 in | Wt 134.0 lb

## 2016-09-28 VITALS — BP 130/68 | HR 84 | Ht <= 58 in | Wt 134.0 lb

## 2016-09-28 DIAGNOSIS — I248 Other forms of acute ischemic heart disease: Secondary | ICD-10-CM | POA: Diagnosis not present

## 2016-09-28 DIAGNOSIS — E782 Mixed hyperlipidemia: Secondary | ICD-10-CM

## 2016-09-28 DIAGNOSIS — I2510 Atherosclerotic heart disease of native coronary artery without angina pectoris: Secondary | ICD-10-CM

## 2016-09-28 DIAGNOSIS — J42 Unspecified chronic bronchitis: Secondary | ICD-10-CM

## 2016-09-28 DIAGNOSIS — J189 Pneumonia, unspecified organism: Secondary | ICD-10-CM | POA: Diagnosis not present

## 2016-09-28 DIAGNOSIS — Q231 Congenital insufficiency of aortic valve: Secondary | ICD-10-CM

## 2016-09-28 DIAGNOSIS — I10 Essential (primary) hypertension: Secondary | ICD-10-CM | POA: Diagnosis not present

## 2016-09-28 DIAGNOSIS — E876 Hypokalemia: Secondary | ICD-10-CM

## 2016-09-28 DIAGNOSIS — J441 Chronic obstructive pulmonary disease with (acute) exacerbation: Secondary | ICD-10-CM | POA: Diagnosis not present

## 2016-09-28 DIAGNOSIS — Z09 Encounter for follow-up examination after completed treatment for conditions other than malignant neoplasm: Secondary | ICD-10-CM

## 2016-09-28 MED ORDER — POTASSIUM CHLORIDE CRYS ER 20 MEQ PO TBCR: 20.0000 meq | EXTENDED_RELEASE_TABLET | Freq: Every day | ORAL | 3 refills | Status: DC

## 2016-09-28 NOTE — Addendum Note (Signed)
Addended by: Bryna ColanderALLEN, PAMELA S on: 09/28/2016 02:14 PM   Modules accepted: Orders

## 2016-09-28 NOTE — Progress Notes (Signed)
Subjective:    Patient ID: Michelle Barnett, female    DOB: [birth year], 55 y.o.   MRN: [id number]  HPI  Michelle Barnett is a 55 year old female with a history of COPD, not on home oxygen, who presents today for TCM hospital follow up. [facility] ED [facility]19/16 with a chief complaint of shortness of breath. She was treated in her home clinic several days prior for acute asthma exacerbation and was prescribed a prednisone taper and azithromycin. Her room air saturation upon arrival to the ED was 81%. She was also tachypneic and tachycardic. During her stay in the ED she was treated with IV solumedrol and nebulized treatments. She underwent lab work which showed leukocytosis and elevated troponin. She was admitted for further work up and treatment of presumed NSTEMI.  During her hospitalization she was consulted by cardiology. Her elevated troponin levels were initially determined to be caused by demand ischemia. Cardiology recommended outpatient follow up including stress test and monitoring for aortic valve abnormality. After re-evaluation the decision was made for cardiac catheterization given EKG changes when compared to August. Cardiac catheterization was with nonobstructive disease. She was treated for CAP with levaquin given positive blood cultures. Her troponin levels trended downwards during admission. She was discharged home on 09/26 with prescriptions for Levaquin 500 mg, benzonatate 200 mg, cough syrup, and prednisone 21 tablet taper pack.   Since her discharge home she's feeling overall improved. Her cough and strength have improved. She went back to work on Saturday September 28th and overall did well initially but was sent home early as she started to feel fatigued/weak. She followed with cardiology today and underwent repeat BMP given hypokalemia during hospitalization. She was prescribed potassium 20 meq once daily today and is scheduled for repeat potassium next week. She is also  scheduled for an echocardiogram next Monday. She was compliant to her Levaquin and prednisone, and completed both courses Sunday this week. She is using her albuterol every 6 hours with improvement.   Review of Systems  Constitutional: Positive for fatigue. Negative for chills and fever.  HENT: Positive for congestion. Negative for sore throat.   Respiratory: Positive for wheezing.        Mild cough, improving. Mild dyspnea, improving.   Cardiovascular: Negative for chest pain.  Gastrointestinal: Negative for nausea.  Neurological: Negative for dizziness and weakness.       Past Medical History:  Diagnosis Date  . Asthma   . Bicuspid aortic valve 09/20/2016   a. echo 09/19/16: EF 55-60%, GR1DD, possible bicuspid aortic valve without evidence of AS  . Bronchitis   . Coronary artery disease, non-occlusive    a. cath 09/21/16: ostLM to LM 20%, no evidence of aortic stenosis  . Demand ischemia (HCC) 09/20/2016  . Heart murmur   . Hiatal hernia   . History of blood transfusion   . Hypertension   . Ulcer [facility](HCC)      Social History   Social History  . Marital status: Single    Spouse name: N/A  . Number of children: N/A  . Years of education: N/A   Occupational History  . Not on file.   Social History Main Topics  . Smoking status: Never Smoker  . Smokeless tobacco: Never Used  . Alcohol use No  . Drug use: No  . Sexual activity: No   Other Topics Concern  . Not on file   Social History Narrative  . No narrative on file  Past Surgical History:  Procedure Laterality Date  . abdominal tumor    . ABLATION    . CARDIAC CATHETERIZATION N/A 09/21/2016   Procedure: Left Heart Cath and Coronary Angiography;  Surgeon: Iran Ouch, MD;  Location: [facility] INVASIVE CV LAB;  Service: Cardiovascular;  Laterality: N/A;  . COLONOSCOPY N/A 08/14/2016   Procedure: COLONOSCOPY;  Surgeon: Sherrilyn Rist, MD;  Location: [facility] ENDOSCOPY;  Service: Endoscopy;  Laterality: N/A;  .  ESOPHAGOGASTRODUODENOSCOPY N/A 08/13/2016   Procedure: ESOPHAGOGASTRODUODENOSCOPY (EGD);  Surgeon: Sherrilyn Rist, MD;  Location: [facility] ENDOSCOPY;  Service: Gastroenterology;  Laterality: N/A;  . TOOTH EXTRACTION      Family History  Problem Relation Age of Onset  . Stroke Mother   . Hypertension Mother   . Hypertension Father   . Heart disease Father   . Hypertension Brother   . Breast cancer Maternal Aunt     Allergies  Allergen Reactions  . Aspirin Anaphylaxis  . Dairy Aid [Lactase] Swelling and Other (See Comments)    Any dairy products  . Benadryl [Diphenhydramine] Palpitations  . Penicillins Other (See Comments)    Reaction: Unknown    Current Outpatient Prescriptions on File Prior to Visit  Medication Sig Dispense Refill  . albuterol (PROVENTIL) (2.5 MG/3ML) 0.083% nebulizer solution Take 2.5 mg by nebulization every 2 (two) hours as needed for wheezing or shortness of breath.    Marland Kitchen albuterol (VENTOLIN HFA) 108 (90 Base) MCG/ACT inhaler INHALE 2 PUFFS INTO THE LUNGS EVERY 6 (SIX) HOURS AS NEEDED FOR WHEEZING OR SHORTNESS OF BREATH. 18 Inhaler 1  . Ascorbic Acid (VITAMIN C) 1000 MG tablet Take 1,000 mg by mouth 2 (two) times daily.     . ferrous sulfate 325 (65 FE) MG tablet Take 325 mg by mouth 2 (two) times daily.     . folic acid (FOLVITE) 800 MCG tablet Take 800 mcg by mouth daily.     Marland Kitchen guaiFENesin-codeine 100-10 MG/5ML syrup Take 10 mLs by mouth every 4 (four) hours as needed for cough. 120 mL 0  . lisinopril-hydrochlorothiazide (PRINZIDE,ZESTORETIC) 10-12.5 MG tablet Take 1 tablet by mouth daily. 90 tablet 3  . magnesium oxide (MAG-OX) 400 MG tablet Take 400 mg by mouth daily.    . Multiple Vitamins-Calcium (ONE-A-DAY WOMENS PO) Take 1 tablet by mouth daily.    . Omega-3 Fatty Acids (FISH OIL) 1200 MG CAPS Take 1 capsule by mouth daily.     . pantoprazole (PROTONIX) 40 MG tablet Take 1 tablet (40 mg total) by mouth daily at 6 (six) AM. 30 tablet 1  . vitamin B-12  (CYANOCOBALAMIN) 1000 MCG tablet Take 1,000 mcg by mouth daily.     No current facility-administered medications on file prior to visit.     BP 140/90   Pulse 84   Temp 98 F (36.7 C) (Oral)   Ht 4' 8.75" (1.441 m)   Wt 134 lb (60.8 kg)   SpO2 94%   BMI 29.25 kg/m    Objective:   Physical Exam  Constitutional: She is oriented to person, place, and time. She appears well-nourished. She does not appear ill.  HENT:  Mouth/Throat: Oropharynx is clear and moist.  Neck: Neck supple.  Cardiovascular: Normal rate and regular rhythm.   Pulmonary/Chest: Effort normal. No respiratory distress. She has wheezes. She has no rhonchi. She has no rales.  Mild bilateral upper lobe wheezing  Neurological: She is alert and oriented to person, place, and time.  Skin: Skin is warm  and dry.  Psychiatric: She has a normal mood and affect.          Assessment & Plan:  TCM Follow Up:   Admitted to [facility] on 09/23 for Demand Ischemia, COPD exacerbation, CAP, Bicuspid Aortic valve. Underwent treatment with IV fluids and sterios, antibiotics, nebulized breathing treatments. Cardiac catheterization with nonobstructive disease. Troponins trended downward by discharge. Discharged home on 09/26.  Overall feeling improved and stable. Compliant to antibiotics and steroids. Exam today stable with mild wheezing, appears well. Saw cardiology today, due for repeat BMP and repeat Echo next week. Continue albuterol inhaler every 6 hours as needed. Return precautions provided.  All hospital labs, imaging, and notes reviewed.  Morrie Sheldon, NP

## 2016-09-28 NOTE — Progress Notes (Signed)
Pre visit review using our clinic review tool, if applicable. No additional management support is needed unless otherwise documented below in the visit note. 

## 2016-09-28 NOTE — Progress Notes (Signed)
Cardiology Office Note Date:  09/28/2016  Patient ID:  Michelle Barnett, Michelle Barnett Sep 12, 1961, MRN [id number] PCP:  Ruthe Mannan, MD  Cardiologist:  Dr. Kirke Corin, MD    Chief Complaint: Hospital follow up   History of Present Illness: Michelle Barnett is a 55 y.o. 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   She noted increased SOB x 2 weeks prior to her admission. Initially saw PCP and was started on ABX, steroidsm and nebs. Her symtpoms persisted causing her to present to [facility]. Upon her arrival she was noted to have an oxygen saturation of 81% on RA in the ED. BP was 181/102. Troponin peaked at 0.78. Echo showed an EF of 55-60%, GR1DD, possible bicuspid aortic valve without any evidence of aortic stenosis, otherwise her echo was unremarkable. She underwent LHC on 9/25 that showed ostial LM to LM with 20% stenosis without any evidence of aortic stenosis. Her elevated troponin was felt to be 2/2 supply demand ischemia in the setting of her COPD exacerbation. She had one episode of brief bilateral blurred vision on 9/26 without further focal defect. Imaging felt not indicated.    She has felt much better since her discharge and just completed her prednisone and ABX. She does note a mild sore throat and will be seeing her PCP for this. No SOB, chest pain, palpitations, diaphoresis, dizziness, presyncope, or syncope. No LE swelling, orthopnea, or early satiety. She has been on lisinopril/HCTZ for years with prior history of hypokalemia. No on potassium repletion.     Past Medical History:  Diagnosis Date  . Asthma   . Bicuspid aortic valve 09/20/2016   a. echo 09/19/16: EF  55-60%, GR1DD, possible bicuspid aortic valve without evidence of AS  . Bronchitis   . Coronary artery disease, non-occlusive    a. cath 09/21/16: ostLM to LM 20%, no evidence of aortic stenosis  . Demand ischemia (HCC) 09/20/2016  . Heart murmur   . Hiatal hernia   . History of blood transfusion   . Hypertension   . Ulcer [facility])     Past Surgical History:  Procedure Laterality Date  . abdominal tumor    . ABLATION    . CARDIAC CATHETERIZATION N/A 09/21/2016   Procedure: Left Heart Cath and Coronary Angiography;  Surgeon: Iran Ouch, MD;  Location: [facility] INVASIVE CV LAB;  Service: Cardiovascular;  Laterality: N/A;  . COLONOSCOPY N/A 08/14/2016   Procedure: COLONOSCOPY;  Surgeon: Sherrilyn Rist, MD;  Location: [facility] ENDOSCOPY;  Service: Endoscopy;  Laterality: N/A;  . ESOPHAGOGASTRODUODENOSCOPY N/A 08/13/2016   Procedure: ESOPHAGOGASTRODUODENOSCOPY (EGD);  Surgeon: Sherrilyn Rist, MD;  Location: [facility] ENDOSCOPY;  Service: Gastroenterology;  Laterality: N/A;  . TOOTH EXTRACTION      Current Outpatient Prescriptions  Medication Sig Dispense Refill  . albuterol (PROVENTIL) (2.5 MG/3ML) 0.083% nebulizer solution Take 2.5 mg by nebulization every 2 (two) hours as needed for wheezing or shortness of breath.    Marland Kitchen albuterol (VENTOLIN HFA) 108 (90 Base) MCG/ACT inhaler INHALE 2 PUFFS INTO THE LUNGS EVERY 6 (SIX) HOURS AS NEEDED FOR WHEEZING OR SHORTNESS OF BREATH. 18 Inhaler 1  . Ascorbic Acid (VITAMIN C) 1000 MG tablet Take 1,000 mg by  mouth 2 (two) times daily.     . benzonatate (TESSALON) 200 MG capsule Take 1 capsule (200 mg total) by mouth 3 (three) times daily as needed for cough. 20 capsule 0  . ferrous sulfate 325 (65 FE) MG tablet Take 325 mg by mouth 2 (two) times daily.     . folic acid (FOLVITE) 800 MCG tablet Take 800 mcg by mouth daily.     Marland Kitchen guaiFENesin-codeine 100-10 MG/5ML syrup Take 10 mLs by mouth every 4 (four) hours as needed for cough. 120 mL 0  .  lisinopril-hydrochlorothiazide (PRINZIDE,ZESTORETIC) 10-12.5 MG tablet Take 1 tablet by mouth daily. 90 tablet 3  . magnesium oxide (MAG-OX) 400 MG tablet Take 400 mg by mouth daily.    . Multiple Vitamins-Calcium (ONE-A-DAY WOMENS PO) Take 1 tablet by mouth daily.    . Omega-3 Fatty Acids (FISH OIL) 1200 MG CAPS Take 1 capsule by mouth daily.     . pantoprazole (PROTONIX) 40 MG tablet Take 1 tablet (40 mg total) by mouth daily at 6 (six) AM. 30 tablet 1  . predniSONE (STERAPRED UNI-PAK 21 TAB) 10 MG (21) TBPK tablet 40mg  x1 day, 20mg  x2 day, 10mg  x2 day then stop 10 tablet 0  . vitamin B-12 (CYANOCOBALAMIN) 1000 MCG tablet Take 1,000 mcg by mouth daily.     No current facility-administered medications for this visit.     Allergies:   Aspirin; Dairy aid [lactase]; Benadryl [diphenhydramine]; and Penicillins   Social History:  The patient  reports that she has never smoked. She has never used smokeless tobacco. She reports that she does not drink alcohol or use drugs.   Family History:  The patient's family history includes Breast cancer in her maternal aunt; Heart disease in her father; Hypertension in her brother, father, and mother; Stroke in her mother.  ROS:   Review of Systems  Constitutional: Negative for chills, diaphoresis, fever, malaise/fatigue and weight loss.  HENT: Positive for sore throat. Negative for congestion.   Eyes: Negative for discharge and redness.  Respiratory: Negative for cough, sputum production, shortness of breath and wheezing.   Cardiovascular: Negative for chest pain, palpitations, orthopnea, claudication, leg swelling and PND.  Gastrointestinal: Negative for abdominal pain, heartburn, nausea and vomiting.  Musculoskeletal: Negative for falls and myalgias.  Skin: Negative for rash.  Neurological: Negative for dizziness, tingling, tremors, sensory change, speech change, focal weakness, loss of consciousness and weakness.  Endo/Heme/Allergies: Does not  bruise/bleed easily.  Psychiatric/Behavioral: Negative for substance abuse. The patient is not nervous/anxious.   All other systems reviewed and are negative.    PHYSICAL EXAM:  VS:  BP 130/68 (BP Location: Left Arm, Patient Position: Sitting, Cuff Size: Normal)   Pulse 84   Ht 4\' 8"  (1.422 m)   Wt 134 lb (60.8 kg)   BMI 30.04 kg/m  BMI: Body mass index is 30.04 kg/m.  Physical Exam  Constitutional: She is oriented to person, place, and time. She appears well-developed and well-nourished.  HENT:  Head: Normocephalic and atraumatic.  Eyes: Right eye exhibits no discharge. Left eye exhibits no discharge.  Neck: Normal range of motion. No JVD present.  Cardiovascular: Normal rate, regular rhythm, S1 normal and S2 normal.  Exam reveals no distant heart sounds, no friction rub, no midsystolic click and no opening snap.   Murmur heard.  Harsh midsystolic murmur is present with a grade of 3/6  at the upper right sternal border radiating to the neck Pulmonary/Chest: Effort normal and breath sounds normal. No  respiratory distress. She has no decreased breath sounds. She has no wheezes. She has no rales. She exhibits no tenderness.  Abdominal: Soft. She exhibits no distension. There is no tenderness.  Musculoskeletal: She exhibits no edema.  Neurological: She is alert and oriented to person, place, and time.  Skin: Skin is warm and dry. No cyanosis. Nails show no clubbing.  Psychiatric: She has a normal mood and affect. Her speech is normal and behavior is normal. Judgment and thought content normal.     EKG:  Was ordered and interpreted by me today. Shows NSR, 84 bpm, no acute st/t changes   Recent Labs: 06/16/2016: TSH 1.67 08/11/2016: ALT 14 09/21/2016: BUN 22; Creatinine, Ser 0.61; Hemoglobin 13.2; Platelets 406; Potassium 3.4; Sodium 139  06/16/2016: Cholesterol 194; HDL 66.70; LDL Cholesterol 120; Total CHOL/HDL Ratio 3; Triglycerides 38.0; VLDL 7.6   Estimated Creatinine Clearance:  57.1 mL/min (by C-G formula based on SCr of 0.61 mg/dL).   Wt Readings from Last 3 Encounters:  09/28/16 134 lb (60.8 kg)  09/19/16 134 lb 3.2 oz (60.9 kg)  09/16/16 132 lb (59.9 kg)     Other studies reviewed: Additional studies/records reviewed today include: summarized above  ASSESSMENT AND PLAN:  1. Nonobstructive CAD: No symptoms concerning for chest pain. Recent nonobstructive cardiac cath as above. No plans for further ischemic evaluation at this time. Not on ASA secondary to anaphylaxis allergy. Not on beta blocker 2/2 COPD/asthma. Continue lisinopril.   2. Bicuspid aortic valve: Asymptomatic at this time. No evidence of aortic stenosis via cardiac cath or by echo. Will need follow up echo in 12 months to evaluate for velocity changes.   3. HTN: Stable. Continue current medications for now. Consider discontinuing HCTZ given her issues with hypokalemia. If low-dose KCl repletion does not correct this would stop HCTZ and change to alternative antihypertensive.   4. Hypokalemia: Start KCl 20 meq daily. Check bmet today and 1 week after starting KCl.   5. COPD: Stable. She does not appear to be in active exacerbation at this time. Continue current medications per PCP.   6. Sore throat: Seeing PCP.   7. Transient blurred vision while inpatient: Resolved.   8. HLD: LDL of 120 from 05/2016. Diet and exercise. Needs fasting lipid panel, if LDL remains elevated would start statin.   Disposition: F/u with Dr. Mariah MillingGollan, MD in 6 months.   Current medicines are reviewed at length with the patient today.  The patient did not have any concerns regarding medicines.  Elinor DodgeSigned, Marche Hottenstein PA-C 09/28/2016 1:50 PM     [facility] [place] [address] [place], [place] [address] [phone]

## 2016-09-28 NOTE — Patient Instructions (Signed)
Continue use of your albuterol inhaler every 6 hours as needed for wheezing/shortness of breath.   Please notify myself or Dr. Dayton MartesAron if you develop worsening cough, congestion, you develop fevers, and/or start to feel worse.  Follow up with cardiology next week as scheduled. Follow up with Dr. Dayton MartesAron as scheduled.  It was a pleasure meeting you!

## 2016-09-28 NOTE — Patient Instructions (Addendum)
Medication Instructions:  Your physician has recommended you make the following change in your medication:  1. START Potassium 20 meq Once daily   Labwork: Your physician recommends that you return for lab work in: 1 week for BMP.    Testing/Procedures: Your physician has requested that you have an echocardiogram. Echocardiography is a painless test that uses sound waves to create images of your heart. It provides your doctor with information about the size and shape of your heart and how well your heart's Claude and valves are working. This procedure takes approximately one hour. There are no restrictions for this procedure.    Follow-Up: Your physician wants you to follow-up in: 6 months with Dr. Mariah MillingGollan. You will receive a reminder letter in the mail two months in advance. If you don't receive a letter, please call our office to schedule the follow-up appointment.  It was a pleasure seeing you today here in the office. Please do not hesitate to give us a call back if you have any further questions. [phone]  [place] CellarPamela A. RN, BSN      Echocardiogram An echocardiogram, or echocardiography, uses sound waves (ultrasound) to produce an image of your heart. The echocardiogram is simple, painless, obtained within a short period of time, and offers valuable information to your health care provider. The images from an echocardiogram can provide information such as:  Evidence of coronary artery disease (CAD).  Heart size.  Heart muscle function.  Heart valve function.  Aneurysm detection.  Evidence of a past heart attack.  Fluid buildup around the heart.  Heart muscle thickening.  Assess heart valve function. LET [facility] Center[facility]OUR [facility]ALTH CARE PROVIDER KNOW ABOUT:  Any allergies you have.  All medicines you are taking, including vitamins, herbs, eye drops, creams, and over-the-counter medicines.  Previous problems you or members of your family have had with the use of  anesthetics.  Any blood disorders you have.  Previous surgeries you have had.  Medical conditions you have.  Possibility of pregnancy, if this applies. BEFORE THE PROCEDURE  No special preparation is needed. Eat and drink normally.  PROCEDURE   In order to produce an image of your heart, gel will be applied to your chest and a wand-like tool (transducer) will be moved over your chest. The gel will help transmit the sound waves from the transducer. The sound waves will harmlessly bounce off your heart to allow the heart images to be captured in real-time motion. These images will then be recorded.  You may need an IV to receive a medicine that improves the quality of the pictures. AFTER THE PROCEDURE You may return to your normal schedule including diet, activities, and medicines, unless your health care provider tells you otherwise.   This information is not intended to replace advice given to you by your health care provider. Make sure you discuss any questions you have with your health care provider.   Document Released: 12/11/2000 Document Revised: 01/04/2015 Document Reviewed: 08/21/2013 Elsevier Interactive Patient Education [employer]2016 Elsevier Inc.

## 2016-09-29 ENCOUNTER — Encounter: Payer: Managed Care, Other (non HMO) | Admitting: Cardiology

## 2016-09-29 ENCOUNTER — Ambulatory Visit: Payer: Managed Care, Other (non HMO) | Admitting: Family Medicine

## 2016-09-29 ENCOUNTER — Other Ambulatory Visit: Payer: Self-pay

## 2016-09-29 DIAGNOSIS — Q231 Congenital insufficiency of aortic valve: Secondary | ICD-10-CM

## 2016-09-29 DIAGNOSIS — I10 Essential (primary) hypertension: Secondary | ICD-10-CM

## 2016-09-29 DIAGNOSIS — I2510 Atherosclerotic heart disease of native coronary artery without angina pectoris: Secondary | ICD-10-CM

## 2016-09-29 LAB — BASIC METABOLIC PANEL
BUN/Creatinine Ratio: 27 — ABNORMAL HIGH (ref 9–23)
BUN: 26 mg/dL — ABNORMAL HIGH (ref 6–24)
CALCIUM: 9.3 mg/dL (ref 8.7–10.2)
CO2: 28 mmol/L (ref 18–29)
CREATININE: 0.95 mg/dL (ref 0.57–1.00)
Chloride: 102 mmol/L (ref 96–106)
GFR, EST AFRICAN AMERICAN: 77 mL/min/{1.73_m2} (ref >59)
GFR, EST NON AFRICAN AMERICAN: 67 mL/min/{1.73_m2} (ref >59)
Glucose: 107 mg/dL — ABNORMAL HIGH (ref 65–99)
POTASSIUM: 4 mmol/L (ref 3.5–5.2)
Sodium: 143 mmol/L (ref 134–144)

## 2016-09-29 MED ORDER — POTASSIUM CHLORIDE CRYS ER 20 MEQ PO TBCR: 10.0000 meq | EXTENDED_RELEASE_TABLET | Freq: Every day | ORAL | 3 refills | Status: DC

## 2016-10-01 ENCOUNTER — Ambulatory Visit: Payer: Managed Care, Other (non HMO) | Admitting: Family Medicine

## 2016-10-05 ENCOUNTER — Other Ambulatory Visit: Payer: Managed Care, Other (non HMO)

## 2016-10-12 ENCOUNTER — Telehealth: Payer: Self-pay | Admitting: Physician Assistant

## 2016-10-12 NOTE — Telephone Encounter (Signed)
Ok for dental fillings without further work up (recent nonobstructive cath in September 2017). No need to hold medications for dental fillings.

## 2016-10-12 NOTE — Telephone Encounter (Signed)
Routed to fax # provided. 

## 2016-10-12 NOTE — Telephone Encounter (Signed)
Request for surgical clearance:  1. What type of surgery is being performed? Fillings may need other dental fillings as well.   2. When is this surgery scheduled? Not yet   3. Are there any medications that need to be held prior to surgery and how long? Not sure would like to know if she needs to stop or hold anything.   4. Name of physician performing surgery? Dr Frederico HammanHimia Sexena   5. What is your office phone and fax number? Fax [phone]

## 2016-10-26 ENCOUNTER — Telehealth: Payer: Self-pay | Admitting: Family Medicine

## 2016-10-26 NOTE — Telephone Encounter (Signed)
Tammy at the pts dentist office called to find out if the pt needs to be pre medicated before any procedures.  Please call them to advise.Pt was in the office today.

## 2016-10-26 NOTE — Telephone Encounter (Signed)
I did not see her today.  Chart reviewed. It appears cardiology has already responded to her question.

## 2016-10-28 NOTE — Telephone Encounter (Signed)
[place] [facility] calling asking also about if patient may be able to get cleanings but this would be a deep cleaning, where numbing would be needed. Please advise.   Fax [phone]

## 2016-10-29 NOTE — Telephone Encounter (Signed)
Dr Wilson SingerSaxena would like to clarify  Does patient need to be premedicated for Extraction and or a deep cleaning  Please advise   Fax [phone]

## 2016-10-29 NOTE — Telephone Encounter (Signed)
Routed to fax # provided. 

## 2016-10-29 NOTE — Telephone Encounter (Signed)
Please see my response from 10/12/2016.

## 2016-10-29 NOTE — Telephone Encounter (Signed)
No indication for antibiotic prophylaxis in patient with native valves, no prior history of bacterial endocarditis, native heart, and no history of repaired or unrepaired congential defect. Patient has possible bicuspid aortic valve and dental prophylaxis is not recommend for this patient population. Per current guidelines no prophylaxis is indicated. If DDS is planning for multiple extractions defer to them per their standard of care regarding prophylaxis.   Patient with nonobstructive CAD by cardiac cath in 08/2016.

## 2016-11-09 ENCOUNTER — Ambulatory Visit: Payer: Managed Care, Other (non HMO) | Admitting: Family Medicine

## 2016-12-14 ENCOUNTER — Ambulatory Visit: Payer: Managed Care, Other (non HMO) | Admitting: Family Medicine

## 2016-12-16 ENCOUNTER — Ambulatory Visit (INDEPENDENT_AMBULATORY_CARE_PROVIDER_SITE_OTHER): Payer: Managed Care, Other (non HMO) | Admitting: Family Medicine

## 2016-12-16 ENCOUNTER — Encounter: Payer: Self-pay | Admitting: Family Medicine

## 2016-12-16 VITALS — BP 140/88 | HR 69 | Temp 97.9°F | Wt 136.8 lb

## 2016-12-16 DIAGNOSIS — D62 Acute posthemorrhagic anemia: Secondary | ICD-10-CM

## 2016-12-16 DIAGNOSIS — Z23 Encounter for immunization: Secondary | ICD-10-CM | POA: Diagnosis not present

## 2016-12-16 LAB — CBC WITH DIFFERENTIAL/PLATELET
BASOS PCT: 0.8 % (ref 0.0–3.0)
Basophils Absolute: 0 10*3/uL (ref 0.0–0.1)
EOS PCT: 2.2 % (ref 0.0–5.0)
Eosinophils Absolute: 0.1 10*3/uL (ref 0.0–0.7)
HEMATOCRIT: 40.8 % (ref 36.0–46.0)
Hemoglobin: 13.6 g/dL (ref 12.0–15.0)
LYMPHS ABS: 0.9 10*3/uL (ref 0.7–4.0)
LYMPHS PCT: 19.9 % (ref 12.0–46.0)
MCHC: 33.3 g/dL (ref 30.0–36.0)
MCV: 89.5 fl (ref 78.0–100.0)
MONOS PCT: 9.1 % (ref 3.0–12.0)
Monocytes Absolute: 0.4 10*3/uL (ref 0.1–1.0)
NEUTROS ABS: 3 10*3/uL (ref 1.4–7.7)
NEUTROS PCT: 68 % (ref 43.0–77.0)
PLATELETS: 202 10*3/uL (ref 150.0–400.0)
RBC: 4.56 Mil/uL (ref 3.87–5.11)
RDW: 16.6 % — ABNORMAL HIGH (ref 11.5–15.5)
WBC: 4.4 10*3/uL (ref 4.0–10.5)

## 2016-12-16 NOTE — Progress Notes (Signed)
Subjective:   Patient ID: Michelle Barnett, female    DOB: September 17, 1961, 55 y.o.   MRN: [id number]  Michelle Barnett is a pleasant 55 y.o. year old female who presents to clinic today with Follow-up  on 12/16/2016  HPI:  Visit canceled- only needed flu shot and lab. Current Outpatient Prescriptions on File Prior to Visit  Medication Sig Dispense Refill  . albuterol (PROVENTIL) (2.5 MG/3ML) 0.083% nebulizer solution Take 2.5 mg by nebulization every 2 (two) hours as needed for wheezing or shortness of breath.    Marland Kitchen. albuterol (VENTOLIN HFA) 108 (90 Base) MCG/ACT inhaler INHALE 2 PUFFS INTO THE LUNGS EVERY 6 (SIX) HOURS AS NEEDED FOR WHEEZING OR SHORTNESS OF BREATH. 18 Inhaler 1  . Ascorbic Acid (VITAMIN C) 1000 MG tablet Take 1,000 mg by mouth 2 (two) times daily.     . ferrous sulfate 325 (65 FE) MG tablet Take 325 mg by mouth 2 (two) times daily.     . folic acid (FOLVITE) 800 MCG tablet Take 800 mcg by mouth daily.     Marland Kitchen. lisinopril-hydrochlorothiazide (PRINZIDE,ZESTORETIC) 10-12.5 MG tablet Take 1 tablet by mouth daily. 90 tablet 3  . magnesium oxide (MAG-OX) 400 MG tablet Take 400 mg by mouth daily.    . Multiple Vitamins-Calcium (ONE-A-DAY WOMENS PO) Take 1 tablet by mouth daily.    . Omega-3 Fatty Acids (FISH OIL) 1200 MG CAPS Take 1 capsule by mouth daily.     . pantoprazole (PROTONIX) 40 MG tablet Take 1 tablet (40 mg total) by mouth daily at 6 (six) AM. 30 tablet 1  . potassium chloride SA (K-DUR,KLOR-CON) 20 MEQ tablet Take 0.5 tablets (10 mEq total) by mouth daily. 90 tablet 3  . vitamin B-12 (CYANOCOBALAMIN) 1000 MCG tablet Take 1,000 mcg by mouth daily.     No current facility-administered medications on file prior to visit.     Allergies  Allergen Reactions  . Aspirin Anaphylaxis  . Dairy Aid [Lactase] Swelling and Other (See Comments)    Any dairy products  . Benadryl [Diphenhydramine] Palpitations  . Penicillins Other (See Comments)    Reaction: Unknown    Past  Medical History:  Diagnosis Date  . Asthma   . Bicuspid aortic valve 09/20/2016   a. echo 09/19/16: EF 55-60%, GR1DD, possible bicuspid aortic valve without evidence of AS  . Bronchitis   . Coronary artery disease, non-occlusive    a. cath 09/21/16: ostLM to LM 20%, no evidence of aortic stenosis  . Demand ischemia (HCC) 09/20/2016  . Heart murmur   . Hiatal hernia   . History of blood transfusion   . Hypertension   . Ulcer [facility](HCC)     Past Surgical History:  Procedure Laterality Date  . abdominal tumor    . ABLATION    . CARDIAC CATHETERIZATION N/A 09/21/2016   Procedure: Left Heart Cath and Coronary Angiography;  Surgeon: Iran OuchMuhammad A Arida, MD;  Location: [facility] INVASIVE CV LAB;  Service: Cardiovascular;  Laterality: N/A;  . COLONOSCOPY N/A 08/14/2016   Procedure: COLONOSCOPY;  Surgeon: Sherrilyn RistHenry L Danis III, MD;  [facility];  Service: Endoscopy;  Laterality: N/A;  . ESOPHAGOGASTRODUODENOSCOPY N/A 08/13/2016   Procedure: ESOPHAGOGASTRODUODENOSCOPY (EGD);  Surgeon: Sherrilyn RistHenry L Danis III, MD;  [facility];  Service: Gastroenterology;  Laterality: N/A;  . TOOTH EXTRACTION      Family History  Problem Relation Age of Onset  . Stroke Mother   . Hypertension Mother   . Hypertension Father   .  Heart disease Father   . Hypertension Brother   . Breast cancer Maternal Aunt     Social History   Social History  . Marital status: Single    Spouse name: N/A  . Number of children: N/A  . Years of education: N/A   Occupational History  . Not on file.   Social History Main Topics  . Smoking status: Never Smoker  . Smokeless tobacco: Never Used  . Alcohol use No  . Drug use: No  . Sexual activity: No   Other Topics Concern  . Not on file   Social History Narrative  . No narrative on file   The PMH, PSH, Social History, Family History, Medications, and allergies have been reviewed in [facility], and have been updated if relevant.   Review of Systems     Objective:      BP 140/88   Pulse 69   Temp 97.9 F (36.6 C) (Oral)   Wt 136 lb 12 oz (62 kg)   SpO2 94%   BMI 29.85 kg/m    Physical Exam        Assessment & Plan:   No diagnosis found. No Follow-up on file.

## 2016-12-16 NOTE — Addendum Note (Signed)
Addended by: Desmond DikeKNIGHT, Cindy Brindisi H on: 12/16/2016 10:42 AM   Modules accepted: Orders

## 2016-12-16 NOTE — Progress Notes (Signed)
Pre visit review using our clinic review tool, if applicable. No additional management support is needed unless otherwise documented below in the visit note. 

## 2017-01-21 ENCOUNTER — Ambulatory Visit (INDEPENDENT_AMBULATORY_CARE_PROVIDER_SITE_OTHER): Payer: Managed Care, Other (non HMO) | Admitting: Family

## 2017-01-21 ENCOUNTER — Encounter: Payer: Self-pay | Admitting: Family

## 2017-01-21 ENCOUNTER — Ambulatory Visit (INDEPENDENT_AMBULATORY_CARE_PROVIDER_SITE_OTHER): Payer: Managed Care, Other (non HMO)

## 2017-01-21 VITALS — HR 104 | Temp 98.1°F | Resp 16 | Ht <= 58 in | Wt 139.0 lb

## 2017-01-21 DIAGNOSIS — J111 Influenza due to unidentified influenza virus with other respiratory manifestations: Secondary | ICD-10-CM

## 2017-01-21 LAB — POCT INFLUENZA A/B
INFLUENZA B, POC: POSITIVE — AB
Influenza A, POC: POSITIVE — AB

## 2017-01-21 MED ORDER — GUAIFENESIN-CODEINE 100-10 MG/5ML PO SOLN: 5.0000 mL | Freq: Every evening | ORAL | 0 refills | Status: DC | PRN

## 2017-01-21 MED ORDER — OSELTAMIVIR PHOSPHATE 75 MG PO CAPS: 75.0000 mg | ORAL_CAPSULE | Freq: Two times a day (BID) | ORAL | 0 refills | Status: DC

## 2017-01-21 MED ORDER — IPRATROPIUM-ALBUTEROL 0.5-2.5 (3) MG/3ML IN SOLN: 3.0000 mL | Freq: Once | RESPIRATORY_TRACT | Status: DC

## 2017-01-21 NOTE — Progress Notes (Signed)
Subjective:    Patient ID: Michelle Barnett, female    DOB: [birth year], 56 y.o.   MRN: [id number]  CC: Michelle Barnett is a 56 y.o. female who presents today for an acute visit.    HPI: CC: dry cough, chills, body aches x one day. Tactile warmth. Tried tussionex and used inhaler with relief. Some wheezing. No SOB, CP. Mild HA.   H/o COPD, never smoker  Works at [employer].      08/2016 hospitalized COPD exacerbation  HISTORY:  Past Medical History:  Diagnosis Date  . Asthma   . Bicuspid aortic valve 09/20/2016   a. echo 09/19/16: EF 55-60%, GR1DD, possible bicuspid aortic valve without evidence of AS  . Bronchitis   . Coronary artery disease, non-occlusive    a. cath 09/21/16: ostLM to LM 20%, no evidence of aortic stenosis  . Demand ischemia (HCC) 09/20/2016  . Heart murmur   . Hiatal hernia   . History of blood transfusion   . Hypertension   . Ulcer [facility])    Past Surgical History:  Procedure Laterality Date  . abdominal tumor    . ABLATION    . CARDIAC CATHETERIZATION N/A 09/21/2016   Procedure: Left Heart Cath and Coronary Angiography;  Surgeon: Iran Ouch, MD;  Location: [facility] INVASIVE CV LAB;  Service: Cardiovascular;  Laterality: N/A;  . COLONOSCOPY N/A 08/14/2016   Procedure: COLONOSCOPY;  Surgeon: Sherrilyn Rist, MD;  Location: [facility] ENDOSCOPY;  Service: Endoscopy;  Laterality: N/A;  . ESOPHAGOGASTRODUODENOSCOPY N/A 08/13/2016   Procedure: ESOPHAGOGASTRODUODENOSCOPY (EGD);  Surgeon: Sherrilyn Rist, MD;  Location: [facility] ENDOSCOPY;  Service: Gastroenterology;  Laterality: N/A;  . TOOTH EXTRACTION     Family History  Problem Relation Age of Onset  . Stroke Mother   . Hypertension Mother   . Hypertension Father   . Heart disease Father   . Hypertension Brother   . Breast cancer Maternal Aunt     Allergies: Aspirin; Dairy aid [lactase]; Benadryl [diphenhydramine]; and Penicillins Current Outpatient Prescriptions on File Prior to Visit  Medication Sig Dispense  Refill  . albuterol (PROVENTIL) (2.5 MG/3ML) 0.083% nebulizer solution Take 2.5 mg by nebulization every 2 (two) hours as needed for wheezing or shortness of breath.    Marland Kitchen albuterol (VENTOLIN HFA) 108 (90 Base) MCG/ACT inhaler INHALE 2 PUFFS INTO THE LUNGS EVERY 6 (SIX) HOURS AS NEEDED FOR WHEEZING OR SHORTNESS OF BREATH. 18 Inhaler 1  . Ascorbic Acid (VITAMIN C) 1000 MG tablet Take 1,000 mg by mouth 2 (two) times daily.     . ferrous sulfate 325 (65 FE) MG tablet Take 325 mg by mouth 2 (two) times daily.     . folic acid (FOLVITE) 800 MCG tablet Take 800 mcg by mouth daily.     Marland Kitchen lisinopril-hydrochlorothiazide (PRINZIDE,ZESTORETIC) 10-12.5 MG tablet Take 1 tablet by mouth daily. 90 tablet 3  . magnesium oxide (MAG-OX) 400 MG tablet Take 400 mg by mouth daily.    . Multiple Vitamins-Calcium (ONE-A-DAY WOMENS PO) Take 1 tablet by mouth daily.    . Omega-3 Fatty Acids (FISH OIL) 1200 MG CAPS Take 1 capsule by mouth daily.     . pantoprazole (PROTONIX) 40 MG tablet Take 1 tablet (40 mg total) by mouth daily at 6 (six) AM. 30 tablet 1  . potassium chloride SA (K-DUR,KLOR-CON) 20 MEQ tablet Take 0.5 tablets (10 mEq total) by mouth daily. 90 tablet 3  . vitamin B-12 (CYANOCOBALAMIN) 1000 MCG tablet Take 1,000 mcg by  mouth daily.     No current facility-administered medications on file prior to visit.     Social History  Substance Use Topics  . Smoking status: Never Smoker  . Smokeless tobacco: Never Used  . Alcohol use No    Review of Systems  Constitutional: Positive for chills and fever.  HENT: Positive for congestion. Negative for sinus pressure and sore throat.   Respiratory: Positive for cough and wheezing. Negative for shortness of breath.   Cardiovascular: Negative for chest pain and palpitations.  Gastrointestinal: Negative for nausea and vomiting.      Objective:    Pulse (!) 104   Temp 98.1 F (36.7 C) (Oral)   Resp 16   Ht 4\' 9"  (1.448 m)   Wt 139 lb (63 kg)   SpO2 94%    BMI 30.08 kg/m    Physical Exam  Constitutional: She appears well-developed and well-nourished.  HENT:  Head: Normocephalic and atraumatic.  Right Ear: Hearing, tympanic membrane, external ear and ear canal normal. No drainage, swelling or tenderness. No foreign bodies. Tympanic membrane is not erythematous and not bulging. No middle ear effusion. No decreased hearing is noted.  Left Ear: Hearing, tympanic membrane, external ear and ear canal normal. No drainage, swelling or tenderness. No foreign bodies. Tympanic membrane is not erythematous and not bulging.  No middle ear effusion. No decreased hearing is noted.  Nose: Nose normal. No rhinorrhea. Right sinus exhibits no maxillary sinus tenderness and no frontal sinus tenderness. Left sinus exhibits no maxillary sinus tenderness and no frontal sinus tenderness.  Mouth/Throat: Uvula is midline, oropharynx is clear and moist and mucous membranes are normal. No oropharyngeal exudate, posterior oropharyngeal edema, posterior oropharyngeal erythema or tonsillar abscesses.  Eyes: Conjunctivae are normal.  Cardiovascular: Regular rhythm, normal heart sounds and normal pulses.   Pulmonary/Chest: Effort normal. She has wheezes in the right middle field and the left middle field. She has no rhonchi. She has no rales.  Lymphadenopathy:       Head (right side): No submental, no submandibular, no tonsillar, no preauricular, no posterior auricular and no occipital adenopathy present.       Head (left side): No submental, no submandibular, no tonsillar, no preauricular, no posterior auricular and no occipital adenopathy present.    She has no cervical adenopathy.  Neurological: She is alert.  Skin: Skin is warm and dry.  Psychiatric: She has a normal mood and affect. Her speech is normal and behavior is normal. Thought content normal.  Vitals reviewed.  Patient felt significantly better after albuterol treatment. Lung sounds  increased     Assessment &  Plan:   1. Influenza Walking sao02 93% Afebrile. Flu positive. No labored breathing. Responded to nebulizer treatment. Pending CXR to ensure no associated PNA. Return precautions given.   - POCT Influenza A/B - guaiFENesin-codeine 100-10 MG/5ML syrup; Take 5 mLs by mouth at bedtime as needed for cough.  Dispense: 120 mL; Refill: 0 - oseltamivir (TAMIFLU) 75 MG capsule; Take 1 capsule (75 mg total) by mouth 2 (two) times daily.  Dispense: 10 capsule; Refill: 0 - ipratropium-albuterol (DUONEB) 0.5-2.5 (3) MG/3ML nebulizer solution 3 mL; Take 3 mLs by nebulization once.    I am having Michelle Barnett maintain her vitamin B-12, folic acid, ferrous sulfate, magnesium oxide, Fish Oil, albuterol, lisinopril-hydrochlorothiazide, vitamin C, Multiple Vitamins-Calcium (ONE-A-DAY WOMENS PO), pantoprazole, albuterol, and potassium chloride SA.   No orders of the defined types were placed in this encounter.   Return precautions given.  Risks, benefits, and alternatives of the medications and treatment plan prescribed today were discussed, and patient expressed understanding.   Education regarding symptom management and diagnosis given to patient on AVS.  Continue to follow with Ruthe Mannan, MD for routine health maintenance.   Cheri Guppy and I agreed with plan.   Rennie Plowman, FNP

## 2017-01-21 NOTE — Patient Instructions (Signed)
Flu positive  Work note  Please let me know if wheezing worsens as concern for your shortness of breath.   Influenza, Adult Influenza ("the flu") is an infection in the lungs, nose, and throat (respiratory tract). It is caused by a virus. The flu causes many common cold symptoms, as well as a high fever and body aches. It can make you feel very sick. The flu spreads easily from person to person (is contagious). Getting a flu shot (influenza vaccination) every year is the best way to prevent the flu. Follow these instructions at home:  Take over-the-counter and prescription medicines only as told by your doctor.  Use a cool mist humidifier to add moisture (humidity) to the air in your home. This can make it easier to breathe.  Rest as needed.  Drink enough fluid to keep your pee (urine) clear or pale yellow.  Cover your mouth and nose when you cough or sneeze.  Wash your hands with soap and water often, especially after you cough or sneeze. If you cannot use soap and water, use hand sanitizer.  Stay home from work or school as told by your doctor. Unless you are visiting your doctor, try to avoid leaving home until your fever has been gone for 24 hours without the use of medicine.  Keep all follow-up visits as told by your doctor. This is important. How is this prevented?  Getting a yearly (annual) flu shot is the best way to avoid getting the flu. You may get the flu shot in late summer, fall, or winter. Ask your doctor when you should get your flu shot.  Wash your hands often or use hand sanitizer often.  Avoid contact with people who are sick during cold and flu season.  Eat healthy foods.  Drink plenty of fluids.  Get enough sleep.  Exercise regularly. Contact a doctor if:  You get new symptoms.  You have:  Chest pain.  Watery poop (diarrhea).  A fever.  Your cough gets worse.  You start to have more mucus.  You feel sick to your stomach (nauseous).  You  throw up (vomit). Get help right away if:  You start to be short of breath or have trouble breathing.  Your skin or nails turn a bluish color.  You have very bad pain or stiffness in your neck.  You get a sudden headache.  You get sudden pain in your face or ear.  You cannot stop throwing up. This information is not intended to replace advice given to you by your health care provider. Make sure you discuss any questions you have with your health care provider. Document Released: 09/22/2008 Document Revised: 05/21/2016 Document Reviewed: 10/08/2015 Elsevier Interactive Patient Education  2017 [employer]

## 2017-01-21 NOTE — Progress Notes (Signed)
Pre-visit discussion using our clinic review tool. No additional management support is needed unless otherwise documented below in the visit note.  

## 2017-04-28 ENCOUNTER — Other Ambulatory Visit: Payer: Self-pay | Admitting: Family Medicine

## 2017-07-07 ENCOUNTER — Telehealth: Payer: Self-pay | Admitting: Cardiovascular Disease

## 2017-07-07 ENCOUNTER — Telehealth: Payer: Self-pay

## 2017-07-07 DIAGNOSIS — R5383 Other fatigue: Secondary | ICD-10-CM

## 2017-07-07 NOTE — Telephone Encounter (Signed)
Orders entered

## 2017-07-07 NOTE — Telephone Encounter (Signed)
4 attempts to schedule fu appt from recall list.   Deleting recall.  6 month fu per ckout 09/28/16 [place]

## 2017-07-07 NOTE — Telephone Encounter (Signed)
Pt left v/m wanting to know if can be tested for gluten free allergy and pt thinks it is time to have hgb ck. Pt last seen and hgb last checked on 12/16/16.Please advise. No future appts scheduled.

## 2017-07-08 NOTE — Telephone Encounter (Signed)
appt made

## 2017-07-09 ENCOUNTER — Other Ambulatory Visit (INDEPENDENT_AMBULATORY_CARE_PROVIDER_SITE_OTHER): Payer: BLUE CROSS/BLUE SHIELD

## 2017-07-09 DIAGNOSIS — R5383 Other fatigue: Secondary | ICD-10-CM

## 2017-07-09 LAB — CBC
HEMATOCRIT: 35.1 % — AB (ref 36.0–46.0)
HEMOGLOBIN: 11.6 g/dL — AB (ref 12.0–15.0)
MCHC: 33 g/dL (ref 30.0–36.0)
MCV: 93.9 fl (ref 78.0–100.0)
Platelets: 206 10*3/uL (ref 150.0–400.0)
RBC: 3.73 Mil/uL — AB (ref 3.87–5.11)
RDW: 14.1 % (ref 11.5–15.5)
WBC: 2.6 10*3/uL — ABNORMAL LOW (ref 4.0–10.5)

## 2017-07-14 LAB — CELIAC PNL 2 RFLX ENDOMYSIAL AB TTR
(tTG) Ab, IgA: <1 U/mL
(tTG) Ab, IgG: 18 U/mL — ABNORMAL HIGH
Endomysial Ab IgA: NEGATIVE
GLIADIN(DEAM) AB,IGA: 4 U (ref <20)
GLIADIN(DEAM) AB,IGG: 2 U (ref <20)
IMMUNOGLOBULIN A: 102 mg/dL (ref 81–463)

## 2017-07-22 ENCOUNTER — Telehealth: Payer: Self-pay

## 2017-07-22 NOTE — Telephone Encounter (Signed)
It could certainly play a role in anemia but I cannot say for sure if that is the cause.  Would she like me to refer her to a hematologist (blood doctor) for further work up?

## 2017-07-22 NOTE — Telephone Encounter (Signed)
Pt left v/m; pt wants to know is iron level low because pt is glutein free. Pt request cb.

## 2017-07-23 NOTE — Telephone Encounter (Signed)
Spoke to pt and advised. Pt is agreeable to referral and is requesting to be seen at a [place] location; advised to await a call with appt details

## 2017-07-29 ENCOUNTER — Other Ambulatory Visit: Payer: Self-pay | Admitting: Physician Assistant

## 2017-07-29 DIAGNOSIS — Q231 Congenital insufficiency of aortic valve: Secondary | ICD-10-CM

## 2017-07-29 DIAGNOSIS — I2510 Atherosclerotic heart disease of native coronary artery without angina pectoris: Secondary | ICD-10-CM

## 2017-07-29 DIAGNOSIS — I10 Essential (primary) hypertension: Secondary | ICD-10-CM

## 2017-07-29 DIAGNOSIS — Q2381 Bicuspid aortic valve: Secondary | ICD-10-CM

## 2017-07-30 ENCOUNTER — Other Ambulatory Visit: Payer: Self-pay | Admitting: Family Medicine

## 2017-08-30 ENCOUNTER — Other Ambulatory Visit: Payer: Self-pay | Admitting: Family Medicine

## 2017-08-31 NOTE — Telephone Encounter (Signed)
Refilled Lisinopril-Hctz 10-12.5 mg. Patient needs to set up appt for any further medications

## 2017-09-21 ENCOUNTER — Other Ambulatory Visit: Payer: Self-pay

## 2017-09-21 MED ORDER — ALBUTEROL SULFATE HFA 108 (90 BASE) MCG/ACT IN AERS: INHALATION_SPRAY | RESPIRATORY_TRACT | 0 refills | Status: DC

## 2017-09-21 MED ORDER — LISINOPRIL-HYDROCHLOROTHIAZIDE 10-12.5 MG PO TABS: 1.0000 | ORAL_TABLET | Freq: Every day | ORAL | 0 refills | Status: DC

## 2017-09-21 NOTE — Telephone Encounter (Signed)
Pt request refill on albuterol inhaler and lisinopril HCTZ [facility]. Last annual 06/25/16 and last /fu 12/16/16. Pt scheduled appt with Harlin Heys FNP 10/04/17. Advised pt will refill above meds x 1. Pt voiced understanding.

## 2017-10-04 ENCOUNTER — Ambulatory Visit: Payer: BLUE CROSS/BLUE SHIELD | Admitting: Family Medicine

## 2017-11-06 ENCOUNTER — Other Ambulatory Visit: Payer: Self-pay | Admitting: Family Medicine

## 2017-11-08 ENCOUNTER — Ambulatory Visit (INDEPENDENT_AMBULATORY_CARE_PROVIDER_SITE_OTHER): Payer: Managed Care, Other (non HMO) | Admitting: Primary Care

## 2017-11-08 ENCOUNTER — Encounter: Payer: Self-pay | Admitting: Primary Care

## 2017-11-08 VITALS — BP 120/70 | HR 85 | Temp 98.4°F | Ht <= 58 in | Wt 133.0 lb

## 2017-11-08 DIAGNOSIS — J4520 Mild intermittent asthma, uncomplicated: Secondary | ICD-10-CM | POA: Diagnosis not present

## 2017-11-08 DIAGNOSIS — D509 Iron deficiency anemia, unspecified: Secondary | ICD-10-CM

## 2017-11-08 DIAGNOSIS — Q231 Congenital insufficiency of aortic valve: Secondary | ICD-10-CM

## 2017-11-08 DIAGNOSIS — Z23 Encounter for immunization: Secondary | ICD-10-CM

## 2017-11-08 DIAGNOSIS — K219 Gastro-esophageal reflux disease without esophagitis: Secondary | ICD-10-CM

## 2017-11-08 DIAGNOSIS — I10 Essential (primary) hypertension: Secondary | ICD-10-CM

## 2017-11-08 DIAGNOSIS — J4540 Moderate persistent asthma, uncomplicated: Secondary | ICD-10-CM | POA: Insufficient documentation

## 2017-11-08 MED ORDER — ALBUTEROL SULFATE HFA 108 (90 BASE) MCG/ACT IN AERS: INHALATION_SPRAY | RESPIRATORY_TRACT | 0 refills | Status: DC

## 2017-11-08 MED ORDER — LISINOPRIL-HYDROCHLOROTHIAZIDE 10-12.5 MG PO TABS: 1.0000 | ORAL_TABLET | Freq: Every day | ORAL | 3 refills | Status: DC

## 2017-11-08 NOTE — Telephone Encounter (Signed)
Pt has OV today will fax refill at appt/thx dmf

## 2017-11-08 NOTE — Patient Instructions (Addendum)
Complete lab work prior to leaving today. I will notify you of your results once received.   Please schedule a physical with me within the next 3-6 months. You may also schedule a lab only appointment 3-4 days prior. We will discuss your lab results in detail during your physical.  It was a pleasure to see you today!

## 2017-11-08 NOTE — Assessment & Plan Note (Signed)
Stable on omeprazole 20mg. Continue same.  

## 2017-11-08 NOTE — Assessment & Plan Note (Signed)
Murmur noted on exam today, diagnosed in 1993 per patient. Reviewed echocardiogram from 2017. She is asymptomatic.

## 2017-11-08 NOTE — Assessment & Plan Note (Signed)
Uses albuterol appropriately, continue same. Refill sent to pharmacy.

## 2017-11-08 NOTE — Addendum Note (Signed)
Addended by: Tawnya CrookSAMBATH, Ciella Obi on: 11/08/2017 04:45 PM   Modules accepted: Orders

## 2017-11-08 NOTE — Assessment & Plan Note (Signed)
CBC from July 2018 stable. Repeat today. If stable then will repeat in 6 months. Continue oral iron.

## 2017-11-08 NOTE — Progress Notes (Signed)
Subjective:    Patient ID: Michelle Barnett, female    DOB: Feb 08, 1961, 56 y.o.   MRN: [id number]  HPI  [name] is a 56 year old female who presents today to transfer care from Dr. Dayton MartesAron.  1) Asthma: History of acute respiratory failure with hypoxia and "COPD" exacerbation. She denies history of COPD, never a smoker. Currently managed on ProAir inhaler for which she uses twice daily during very cold weather. She's tried Advair in the past but didn't notice improvement. She is needing a refill today.  2) Essential Hypertension: Currently managed on lisinopril-HCTZ 10-12.5 mg.  BP Readings from Last 3 Encounters:  11/08/17 120/70  12/16/16 140/88  09/28/16 140/90     3) CAD/Valve Disorder/Murmur: Previoulsy following with cardiology for potential NSTEMI. Cardiac catheterization in 2017 without evidence of blockage. Elevated troponin levels were noted to be secondary to demand ischemia from asthma exacerbation. She denies chest pain, shortness of breath, lower extremity edema. Echocardiogram in 2017 with EF of 55-60%, aortic valve calcification, tricuspid regurgitation.   4) GERD: Currently managed on omeprazole 20 mg capsules for which she takes once daily. She will experience symptoms of esophageal burning without her omeprazole. She has a history of hital hernia.   5) Chronic Anemia: Present since 2013 with history of blood transfusions. Her last transfusion was 4 years ago. She is currently managed on oral iron. She denies fatigue, palpitations.   Review of Systems  Constitutional: Negative for fatigue.  Respiratory: Negative for shortness of breath.   Cardiovascular: Negative for chest pain.  Gastrointestinal:       Denies GERD symptoms  Neurological: Negative for headaches.       Past Medical History:  Diagnosis Date  . Asthma   . Bicuspid aortic valve 09/20/2016   a. echo 09/19/16: EF 55-60%, GR1DD, possible bicuspid aortic valve without evidence of AS  . Bronchitis     . Coronary artery disease, non-occlusive    a. cath 09/21/16: ostLM to LM 20%, no evidence of aortic stenosis  . Demand ischemia (HCC) 09/20/2016  . Heart murmur   . Hiatal hernia   . History of blood transfusion   . Hypertension   . Ulcer      Social History   Socioeconomic History  . Marital status: Single    Spouse name: Not on file  . Number of children: Not on file  . Years of education: Not on file  . Highest education level: Not on file  Social Needs  . Financial resource strain: Not on file  . Food insecurity - worry: Not on file  . Food insecurity - inability: Not on file  . Transportation needs - medical: Not on file  . Transportation needs - non-medical: Not on file  Occupational History  . Not on file  Tobacco Use  . Smoking status: Never Smoker  . Smokeless tobacco: Never Used  Substance and Sexual Activity  . Alcohol use: No  . Drug use: No  . Sexual activity: No  Other Topics Concern  . Not on file  Social History Narrative  . Not on file    Past Surgical History:  Procedure Laterality Date  . abdominal tumor    . ABLATION    . TOOTH EXTRACTION      Family History  Problem Relation Age of Onset  . Stroke Mother   . Hypertension Mother   . Hypertension Father   . Heart disease Father   . Hypertension Brother   .  Breast cancer Maternal Aunt     Allergies  Allergen Reactions  . Aspirin Anaphylaxis  . Dairy Aid [Lactase] Swelling and Other (See Comments)    Any dairy products  . Benadryl [Diphenhydramine] Palpitations  . Penicillins Other (See Comments)    Reaction: Unknown    Current Outpatient Medications on File Prior to Visit  Medication Sig Dispense Refill  . Ascorbic Acid (VITAMIN C) 1000 MG tablet Take 1,000 mg by mouth 2 (two) times daily.     . ferrous sulfate 325 (65 FE) MG tablet Take 325 mg by mouth 2 (two) times daily.     . folic acid (FOLVITE) 800 MCG tablet Take 800 mcg by mouth daily.     . magnesium oxide (MAG-OX) 400  MG tablet Take 400 mg by mouth daily.    . Multiple Vitamins-Calcium (ONE-A-DAY WOMENS PO) Take 1 tablet by mouth daily.    . Omega-3 Fatty Acids (FISH OIL) 1200 MG CAPS Take 1 capsule by mouth daily.     . vitamin B-12 (CYANOCOBALAMIN) 1000 MCG tablet Take 1,000 mcg by mouth daily.     No current facility-administered medications on file prior to visit.     BP 120/70   Pulse 85   Temp 98.4 F (36.9 C) (Oral)   Ht 4\' 9"  (1.448 m)   Wt 133 lb (60.3 kg)   SpO2 94%   BMI 28.78 kg/m    Objective:   Physical Exam  Constitutional: She appears well-nourished.  Neck: Neck supple.  Cardiovascular: Normal rate and regular rhythm.  Murmur heard. Pulmonary/Chest: Effort normal and breath sounds normal.  Skin: Skin is warm and dry.  Psychiatric: She has a normal mood and affect.          Assessment & Plan:

## 2017-11-08 NOTE — Assessment & Plan Note (Signed)
Stable in the office today. BMP pending. Refills sent to pharmacy for lisinopril-HCTZ.

## 2017-11-09 LAB — COMPREHENSIVE METABOLIC PANEL
ALBUMIN: 4.1 g/dL (ref 3.5–5.2)
ALK PHOS: 43 U/L (ref 39–117)
ALT: 14 U/L (ref 0–35)
AST: 18 U/L (ref 0–37)
BILIRUBIN TOTAL: 0.2 mg/dL (ref 0.2–1.2)
BUN: 23 mg/dL (ref 6–23)
CALCIUM: 10.2 mg/dL (ref 8.4–10.5)
CO2: 37 meq/L — AB (ref 19–32)
Chloride: 100 mEq/L (ref 96–112)
Creatinine, Ser: 1.01 mg/dL (ref 0.40–1.20)
GFR: 59.91 mL/min — AB (ref >60.00)
Glucose, Bld: 103 mg/dL — ABNORMAL HIGH (ref 70–99)
Potassium: 4 mEq/L (ref 3.5–5.1)
Sodium: 144 mEq/L (ref 135–145)
Total Protein: 6.6 g/dL (ref 6.0–8.3)

## 2017-11-09 LAB — CBC
HCT: 36.5 % (ref 36.0–46.0)
HEMOGLOBIN: 11.5 g/dL — AB (ref 12.0–15.0)
MCHC: 31.5 g/dL (ref 30.0–36.0)
MCV: 97.6 fl (ref 78.0–100.0)
PLATELETS: 345 10*3/uL (ref 150.0–400.0)
RBC: 3.74 Mil/uL — AB (ref 3.87–5.11)
RDW: 14.5 % (ref 11.5–15.5)
WBC: 6.4 10*3/uL (ref 4.0–10.5)

## 2017-12-09 ENCOUNTER — Other Ambulatory Visit: Payer: Self-pay | Admitting: Primary Care

## 2017-12-09 ENCOUNTER — Telehealth: Payer: Self-pay | Admitting: Primary Care

## 2017-12-09 DIAGNOSIS — J4520 Mild intermittent asthma, uncomplicated: Secondary | ICD-10-CM

## 2017-12-09 NOTE — Telephone Encounter (Signed)
Copied from CRM [id number]. Topic: Quick Communication - See Telephone Encounter >> Dec 09, 2017  7:41 AM Oneal GroutSebastian, Jennifer S wrote: CRM for notification. See Telephone encounter for:  Requesting refill on albuterol (PROAIR HFA) 108 (90 Base) MCG/ACT inhaler, states [facility] [address]

## 2018-01-07 ENCOUNTER — Other Ambulatory Visit: Payer: Self-pay | Admitting: Primary Care

## 2018-01-07 DIAGNOSIS — J4520 Mild intermittent asthma, uncomplicated: Secondary | ICD-10-CM

## 2018-07-14 ENCOUNTER — Other Ambulatory Visit: Payer: Self-pay | Admitting: Primary Care

## 2018-07-14 DIAGNOSIS — J4520 Mild intermittent asthma, uncomplicated: Secondary | ICD-10-CM

## 2018-08-17 ENCOUNTER — Other Ambulatory Visit: Payer: Self-pay | Admitting: Primary Care

## 2018-08-17 DIAGNOSIS — J4520 Mild intermittent asthma, uncomplicated: Secondary | ICD-10-CM

## 2018-09-20 ENCOUNTER — Other Ambulatory Visit: Payer: Self-pay | Admitting: Primary Care

## 2018-09-20 DIAGNOSIS — J4520 Mild intermittent asthma, uncomplicated: Secondary | ICD-10-CM

## 2018-10-05 ENCOUNTER — Other Ambulatory Visit: Payer: Self-pay | Admitting: Primary Care

## 2018-10-05 DIAGNOSIS — I10 Essential (primary) hypertension: Secondary | ICD-10-CM

## 2018-12-12 ENCOUNTER — Ambulatory Visit (INDEPENDENT_AMBULATORY_CARE_PROVIDER_SITE_OTHER): Payer: Managed Care, Other (non HMO) | Admitting: Primary Care

## 2018-12-12 ENCOUNTER — Encounter: Payer: Self-pay | Admitting: Primary Care

## 2018-12-12 VITALS — BP 148/94 | HR 77 | Temp 98.1°F | Ht <= 58 in | Wt 141.5 lb

## 2018-12-12 DIAGNOSIS — E538 Deficiency of other specified B group vitamins: Secondary | ICD-10-CM | POA: Diagnosis not present

## 2018-12-12 DIAGNOSIS — I10 Essential (primary) hypertension: Secondary | ICD-10-CM

## 2018-12-12 DIAGNOSIS — J4520 Mild intermittent asthma, uncomplicated: Secondary | ICD-10-CM

## 2018-12-12 DIAGNOSIS — Q231 Congenital insufficiency of aortic valve: Secondary | ICD-10-CM

## 2018-12-12 DIAGNOSIS — D509 Iron deficiency anemia, unspecified: Secondary | ICD-10-CM

## 2018-12-12 DIAGNOSIS — J449 Chronic obstructive pulmonary disease, unspecified: Secondary | ICD-10-CM

## 2018-12-12 LAB — IBC PANEL
Iron: 45 ug/dL (ref 42–145)
Saturation Ratios: 13.6 % — ABNORMAL LOW (ref 20.0–50.0)
Transferrin: 236 mg/dL (ref 212.0–360.0)

## 2018-12-12 LAB — CBC
HCT: 43.3 % (ref 36.0–46.0)
Hemoglobin: 14.2 g/dL (ref 12.0–15.0)
MCHC: 32.7 g/dL (ref 30.0–36.0)
MCV: 93.2 fl (ref 78.0–100.0)
PLATELETS: 212 10*3/uL (ref 150.0–400.0)
RBC: 4.65 Mil/uL (ref 3.87–5.11)
RDW: 13 % (ref 11.5–15.5)
WBC: 4 10*3/uL (ref 4.0–10.5)

## 2018-12-12 LAB — COMPREHENSIVE METABOLIC PANEL
ALT: 15 U/L (ref 0–35)
AST: 19 U/L (ref 0–37)
Albumin: 4.2 g/dL (ref 3.5–5.2)
Alkaline Phosphatase: 44 U/L (ref 39–117)
BUN: 20 mg/dL (ref 6–23)
CALCIUM: 9.6 mg/dL (ref 8.4–10.5)
CO2: 34 mEq/L — ABNORMAL HIGH (ref 19–32)
Chloride: 100 mEq/L (ref 96–112)
Creatinine, Ser: 0.68 mg/dL (ref 0.40–1.20)
GFR: 94.22 mL/min (ref >60.00)
Glucose, Bld: 91 mg/dL (ref 70–99)
Potassium: 3.7 mEq/L (ref 3.5–5.1)
Sodium: 140 mEq/L (ref 135–145)
Total Bilirubin: 0.3 mg/dL (ref 0.2–1.2)
Total Protein: 6.8 g/dL (ref 6.0–8.3)

## 2018-12-12 LAB — VITAMIN B12: Vitamin B-12: 1479 pg/mL — ABNORMAL HIGH (ref 211–911)

## 2018-12-12 MED ORDER — LISINOPRIL-HYDROCHLOROTHIAZIDE 10-12.5 MG PO TABS: 1.0000 | ORAL_TABLET | Freq: Every day | ORAL | 0 refills | Status: DC

## 2018-12-12 MED ORDER — ALBUTEROL SULFATE HFA 108 (90 BASE) MCG/ACT IN AERS: INHALATION_SPRAY | RESPIRATORY_TRACT | 0 refills | Status: DC

## 2018-12-12 NOTE — Progress Notes (Signed)
Subjective:    Patient ID: Michelle Barnett, female    DOB: 26-Jun-1961, 57 y.o.   MRN: [id number]  HPI  Ms. Dibiasio is a 57 year old female who presents today for follow up. She was last seen in our office in November 2018.  1) Essential Hypertension: Currently managed on lisinopril-HCTZ 10-12.5 mg. She's been checking her BP at home over the last 2 weeks which has been running 130's/80's. She did take Benadryl OTC this morning.   BP Readings from Last 3 Encounters:  12/12/18 (!) 148/94  11/08/17 120/70  12/16/16 140/88    2) COPD: Currently managed on albuterol HFA inhaler. She is using her inhaler during extreme heat/cold, overall infrequently. She denies shortness of breath.   3) Iron Deficiency Anemia/Vitamin B 12 Deficiency: Currently managed on ferrous sulfate 650 mg every morning and 325 mg in the evening. She is also taking vitamin B 12 once daily. Denies constipation, vaginal bleeding, rectal bleeding.  4) Sinus Pressure: Located to the frontal and maxillary sinus region. Also with post nasal drip, ear pressure. This began 8 days ago. She denies fevers. She's taken two capsules of [employer] brand benadryl with improvement.   Review of Systems  Respiratory: Negative for shortness of breath.   Cardiovascular: Negative for chest pain.  Gastrointestinal: Negative for blood in stool, constipation and diarrhea.  Allergic/Immunologic: Positive for environmental allergies.  Neurological: Negative for dizziness and headaches.       Past Medical History:  Diagnosis Date  . Asthma   . Bicuspid aortic valve 09/20/2016   a. echo 09/19/16: EF 55-60%, GR1DD, possible bicuspid aortic valve without evidence of AS  . Bronchitis   . Coronary artery disease, non-occlusive    a. cath 09/21/16: ostLM to LM 20%, no evidence of aortic stenosis  . Demand ischemia (HCC) 09/20/2016  . Heart murmur   . Hiatal hernia   . History of blood transfusion   . Hypertension   . Ulcer      Social  History   Socioeconomic History  . Marital status: Single    Spouse name: Not on file  . Number of children: Not on file  . Years of education: Not on file  . Highest education level: Not on file  Occupational History  . Not on file  Social Needs  . Financial resource strain: Not on file  . Food insecurity:    Worry: Not on file    Inability: Not on file  . Transportation needs:    Medical: Not on file    Non-medical: Not on file  Tobacco Use  . Smoking status: Never Smoker  . Smokeless tobacco: Never Used  Substance and Sexual Activity  . Alcohol use: No  . Drug use: No  . Sexual activity: Never  Lifestyle  . Physical activity:    Days per week: Not on file    Minutes per session: Not on file  . Stress: Not on file  Relationships  . Social connections:    Talks on phone: Not on file    Gets together: Not on file    Attends religious service: Not on file    Active member of club or organization: Not on file    Attends meetings of clubs or organizations: Not on file    Relationship status: Not on file  . Intimate partner violence:    Fear of current or ex partner: Not on file    Emotionally abused: Not on file    Physically  abused: Not on file    Forced sexual activity: Not on file  Other Topics Concern  . Not on file  Social History Narrative  . Not on file    Past Surgical History:  Procedure Laterality Date  . abdominal tumor    . ABLATION    . CARDIAC CATHETERIZATION N/A 09/21/2016   Procedure: Left Heart Cath and Coronary Angiography;  Surgeon: Iran OuchMuhammad A Arida, MD;  Location: [facility] INVASIVE CV LAB;  Service: Cardiovascular;  Laterality: N/A;  . COLONOSCOPY N/A 08/14/2016   Procedure: COLONOSCOPY;  Surgeon: Sherrilyn RistHenry L Danis III, MD;  [facility];  Service: Endoscopy;  Laterality: N/A;  . ESOPHAGOGASTRODUODENOSCOPY N/A 08/13/2016   Procedure: ESOPHAGOGASTRODUODENOSCOPY (EGD);  Surgeon: Sherrilyn RistHenry L Danis III, MD;  [facility];  Service:  Gastroenterology;  Laterality: N/A;  . TOOTH EXTRACTION      Family History  Problem Relation Age of Onset  . Stroke Mother   . Hypertension Mother   . Hypertension Father   . Heart disease Father   . Hypertension Brother   . Breast cancer Maternal Aunt     Allergies  Allergen Reactions  . Aspirin Anaphylaxis  . Dairy Aid [Lactase] Swelling and Other (See Comments)    Any dairy products  . Benadryl [Diphenhydramine] Palpitations  . Penicillins Other (See Comments)    Reaction: Unknown    Current Outpatient Medications on File Prior to Visit  Medication Sig Dispense Refill  . Ascorbic Acid (VITAMIN C) 1000 MG tablet Take 1,000 mg by mouth 2 (two) times daily.     . ferrous sulfate 325 (65 FE) MG tablet Take 650 mg by mouth 2 (two) times daily.     . folic acid (FOLVITE) 800 MCG tablet Take 800 mcg by mouth daily.     . magnesium oxide (MAG-OX) 400 MG tablet Take 400 mg by mouth daily.    . Multiple Vitamins-Calcium (ONE-A-DAY WOMENS PO) Take 1 tablet by mouth daily.    . Omega-3 Fatty Acids (FISH OIL) 1200 MG CAPS Take 1 capsule by mouth daily.     . vitamin B-12 (CYANOCOBALAMIN) 1000 MCG tablet Take 1,000 mcg by mouth daily.     No current facility-administered medications on file prior to visit.     BP (!) 148/94   Pulse 77   Temp 98.1 F (36.7 C) (Oral)   Ht 4\' 9"  (1.448 m)   Wt 141 lb 8 oz (64.2 kg)   SpO2 93%   BMI 30.62 kg/m    Objective:   Physical Exam  Constitutional: She appears well-nourished.  Neck: Neck supple.  Cardiovascular: Normal rate and regular rhythm.  Murmur heard. Respiratory: Effort normal and breath sounds normal.  Skin: Skin is warm and dry.  Psychiatric: She has a normal mood and affect.           Assessment & Plan:  Viral Sinusitis:  Sinus pressure, post nasal drip x 8 days, overall feeling better. Exam today without obvious bacterial cause.  Continue OTC treatment. Presume viral etiology. She will update if symptoms  persist.  Doreene NestKatherine K Clark, NP

## 2018-12-12 NOTE — Patient Instructions (Signed)
Your blood pressure is too high today. Please monitor your blood pressure and notify me if you see readings at or above 135/90.  Stop by the lab prior to leaving today. I will notify you of your results once received.   Start exercising. You should be getting 150 minutes of moderate intensity exercise weekly.  It's important to improve your diet by reducing consumption of fast food, fried food, processed snack foods, sugary drinks. Increase consumption of fresh vegetables and fruits, whole grains, water.  Ensure you are drinking 64 ounces of water daily.  Schedule a follow up visit in 3 months for blood pressure check.  It was a pleasure to see you today!

## 2018-12-12 NOTE — Assessment & Plan Note (Signed)
Above goal in the office today, home readings seem better. Continue lisinopril-HCTZ 10-12.5 mg. Will have her monitor BP at home and report readings that are at or above 135/90. Follow up in 3 months for BP check. BMP pending today.

## 2018-12-12 NOTE — Assessment & Plan Note (Signed)
Repeat vitamin b12 pending.

## 2018-12-12 NOTE — Assessment & Plan Note (Signed)
Murmur noted on exam today, asymptomatic.  Continue to monitor.

## 2018-12-12 NOTE — Assessment & Plan Note (Signed)
Stable. Exam unremarkable. Refill provided for albuterol, she is using infrequently.

## 2018-12-12 NOTE — Assessment & Plan Note (Signed)
Compliant to iron twice daily. Repeat CBC and IBC panel pending.

## 2019-01-11 ENCOUNTER — Other Ambulatory Visit: Payer: Self-pay | Admitting: Primary Care

## 2019-01-11 DIAGNOSIS — I10 Essential (primary) hypertension: Secondary | ICD-10-CM

## 2019-01-14 ENCOUNTER — Other Ambulatory Visit: Payer: Self-pay | Admitting: Primary Care

## 2019-01-14 DIAGNOSIS — J4520 Mild intermittent asthma, uncomplicated: Secondary | ICD-10-CM

## 2019-03-14 ENCOUNTER — Other Ambulatory Visit: Payer: Self-pay | Admitting: Primary Care

## 2019-03-14 DIAGNOSIS — J4520 Mild intermittent asthma, uncomplicated: Secondary | ICD-10-CM

## 2019-04-04 ENCOUNTER — Telehealth: Payer: Self-pay

## 2019-04-04 NOTE — Telephone Encounter (Signed)
Inbound call to triage - patient requested refills for BP medication and inhaler. Advised patient she needs visit with PCP before BP medication can be refilled. Patient verbalized understanding.  Virtual video appt scheduled 04/05/19 @ 1040

## 2019-04-04 NOTE — Telephone Encounter (Signed)
Noted  

## 2019-04-05 ENCOUNTER — Ambulatory Visit (INDEPENDENT_AMBULATORY_CARE_PROVIDER_SITE_OTHER): Payer: Managed Care, Other (non HMO) | Admitting: Primary Care

## 2019-04-05 DIAGNOSIS — I10 Essential (primary) hypertension: Secondary | ICD-10-CM

## 2019-04-05 NOTE — Assessment & Plan Note (Signed)
Home blood pressure readings are stable.   Continue lisinopril-HCTZ 10-12.5 mg as prescribed. Follow up in December 2020 for labs and repeat BP check.

## 2019-04-05 NOTE — Patient Instructions (Signed)
Continue to monitor your blood pressure 2-3 times weekly, only once daily. Continue taking lisinopril-hydrochlorothiazide 10-12.5 mg tablets for blood pressure.  I would like to see you back in December 2020 for follow up, please schedule an appointment for that time.   It was nice to see you! Mayra Reel, NP-C

## 2019-04-05 NOTE — Progress Notes (Signed)
Subjective:    Patient ID: Michelle GuppyCathy F Hodge, female    DOB: 1961/08/22, 58 y.o.   MRN: [id number]  HPI  Virtual Visit via Video Note  I connected with Michelle Guppyathy F Lothrop on 04/05/19 at 10:40 AM EDT by a video enabled telemedicine application and verified that I am speaking with the correct person using two identifiers.   I discussed the limitations of evaluation and management by telemedicine and the availability of in person appointments. The patient expressed understanding and agreed to proceed.  She is at home, I am in the office.  History of Present Illness:  [name] is a 58 year old female with a history of hypertension who presents today for follow-up of hypertension.  She was last evaluated on 12/12/2018 for follow-up and it was noted at that time that her blood pressure was above goal.  Home readings did seem better.  We decided to continue her lisinopril-hydrochlorothiazide 10-12.5 mg tablets and have her monitor her blood pressure and report consistent readings at or above 135/90.   We decided it would be best for her to follow-up 3 months later for blood pressure check.  Since her last visit she's checking her BP at home three times daily which is running 130's/70's. Her BP this morning was132/77. She denies chest pain, dizziness.   Observations/Objective:  Alert and oriented. Appears well. No distress, speaking in complete senses.  Assessment and Plan:  Home blood pressure readings are stable.   Continue lisinopril-HCTZ 10-12.5 mg as prescribed. Follow up in December 2020 for labs and repeat BP check.  Follow Up Instructions:  Continue to monitor your blood pressure 2-3 times weekly, only once daily. Continue taking lisinopril-hydrochlorothiazide 10-12.5 mg tablets for blood pressure.  I would like to see you back in December 2020 for follow up, please schedule an appointment for that time.   It was nice to see you! Mayra ReelKate Jaishon Krisher, NP-C    I discussed the  assessment and treatment plan with the patient. The patient was provided an opportunity to ask questions and all were answered. The patient agreed with the plan and demonstrated an understanding of the instructions.   The patient was advised to call back or seek an in-person evaluation if the symptoms worsen or if the condition fails to improve as anticipated.     Doreene NestKatherine K Brandolyn Shortridge, NP    Review of Systems  Eyes: Negative for visual disturbance.  Respiratory: Negative for shortness of breath.   Cardiovascular: Negative for chest pain.  Neurological: Negative for dizziness and headaches.       Past Medical History:  Diagnosis Date  . Asthma   . Bicuspid aortic valve 09/20/2016   a. echo 09/19/16: EF 55-60%, GR1DD, possible bicuspid aortic valve without evidence of AS  . Bronchitis   . Coronary artery disease, non-occlusive    a. cath 09/21/16: ostLM to LM 20%, no evidence of aortic stenosis  . Demand ischemia (HCC) 09/20/2016  . Heart murmur   . Hiatal hernia   . History of blood transfusion   . Hypertension   . Ulcer      Social History   Socioeconomic History  . Marital status: Single    Spouse name: Not on file  . Number of children: Not on file  . Years of education: Not on file  . Highest education level: Not on file  Occupational History  . Not on file  Social Needs  . Financial resource strain: Not on file  . Food insecurity:  Worry: Not on file    Inability: Not on file  . Transportation needs:    Medical: Not on file    Non-medical: Not on file  Tobacco Use  . Smoking status: Never Smoker  . Smokeless tobacco: Never Used  Substance and Sexual Activity  . Alcohol use: No  . Drug use: No  . Sexual activity: Never  Lifestyle  . Physical activity:    Days per week: Not on file    Minutes per session: Not on file  . Stress: Not on file  Relationships  . Social connections:    Talks on phone: Not on file    Gets together: Not on file    Attends  religious service: Not on file    Active member of club or organization: Not on file    Attends meetings of clubs or organizations: Not on file    Relationship status: Not on file  . Intimate partner violence:    Fear of current or ex partner: Not on file    Emotionally abused: Not on file    Physically abused: Not on file    Forced sexual activity: Not on file  Other Topics Concern  . Not on file  Social History Narrative  . Not on file    Past Surgical History:  Procedure Laterality Date  . abdominal tumor    . ABLATION    . CARDIAC CATHETERIZATION N/A 09/21/2016   Procedure: Left Heart Cath and Coronary Angiography;  Surgeon: Iran Ouch, MD;  Location: [facility] INVASIVE CV LAB;  Service: Cardiovascular;  Laterality: N/A;  . COLONOSCOPY N/A 08/14/2016   Procedure: COLONOSCOPY;  Surgeon: Sherrilyn Rist, MD;  Location: [facility] ENDOSCOPY;  Service: Endoscopy;  Laterality: N/A;  . ESOPHAGOGASTRODUODENOSCOPY N/A 08/13/2016   Procedure: ESOPHAGOGASTRODUODENOSCOPY (EGD);  Surgeon: Sherrilyn Rist, MD;  Location: [facility] ENDOSCOPY;  Service: Gastroenterology;  Laterality: N/A;  . TOOTH EXTRACTION      Family History  Problem Relation Age of Onset  . Stroke Mother   . Hypertension Mother   . Hypertension Father   . Heart disease Father   . Hypertension Brother   . Breast cancer Maternal Aunt     Allergies  Allergen Reactions  . Aspirin Anaphylaxis  . Dairy Aid [Lactase] Swelling and Other (See Comments)    Any dairy products  . Benadryl [Diphenhydramine] Palpitations  . Penicillins Other (See Comments)    Reaction: Unknown    Current Outpatient Medications on File Prior to Visit  Medication Sig Dispense Refill  . albuterol (PROVENTIL HFA;VENTOLIN HFA) 108 (90 Base) MCG/ACT inhaler INHALE 2 PUFFS INTO LUNGS EVERY 6 HOURS AS NEEDED FOR WHEEZING OR SHORTNESS OF BREATH 9 g 0  . Ascorbic Acid (VITAMIN C) 1000 MG tablet Take 1,000 mg by mouth 2 (two) times daily.     . ferrous  sulfate 325 (65 FE) MG tablet Take 650 mg by mouth 2 (two) times daily.     . folic acid (FOLVITE) 800 MCG tablet Take 800 mcg by mouth daily.     Marland Kitchen lisinopril-hydrochlorothiazide (PRINZIDE,ZESTORETIC) 10-12.5 MG tablet TAKE 1 TABLET BY MOUTH DAILY. NEED APPOINTMENT FOR ANY MORE REFILLS 90 tablet 1  . magnesium oxide (MAG-OX) 400 MG tablet Take 400 mg by mouth daily.    . Multiple Vitamins-Calcium (ONE-A-DAY WOMENS PO) Take 1 tablet by mouth daily.    . Omega-3 Fatty Acids (FISH OIL) 1200 MG CAPS Take 1 capsule by mouth daily.     . vitamin  B-12 (CYANOCOBALAMIN) 1000 MCG tablet Take 1,000 mcg by mouth daily.     No current facility-administered medications on file prior to visit.     There were no vitals taken for this visit.   Objective:   Physical Exam  Constitutional: She is oriented to person, place, and time. She appears well-nourished.  Respiratory: Effort normal.  Neurological: She is alert and oriented to person, place, and time.  Psychiatric: She has a normal mood and affect.           Assessment & Plan:

## 2019-04-14 ENCOUNTER — Other Ambulatory Visit: Payer: Self-pay | Admitting: Primary Care

## 2019-04-14 ENCOUNTER — Telehealth: Payer: Self-pay | Admitting: Primary Care

## 2019-04-14 DIAGNOSIS — I10 Essential (primary) hypertension: Secondary | ICD-10-CM

## 2019-04-14 DIAGNOSIS — J4520 Mild intermittent asthma, uncomplicated: Secondary | ICD-10-CM

## 2019-04-14 MED ORDER — LISINOPRIL-HYDROCHLOROTHIAZIDE 10-12.5 MG PO TABS: ORAL_TABLET | ORAL | 1 refills | Status: DC

## 2019-04-14 MED ORDER — ALBUTEROL SULFATE HFA 108 (90 BASE) MCG/ACT IN AERS: INHALATION_SPRAY | RESPIRATORY_TRACT | 2 refills | Status: DC

## 2019-04-14 NOTE — Telephone Encounter (Signed)
Pt left v/m but Johny Drilling has already spoken with pt since pt left v/m.

## 2019-04-14 NOTE — Telephone Encounter (Signed)
Pt called wanting to get a refill on   abluterol  Lisinopril  [facility] [address]  Pt was upset she thought when she did a [facility] appointment on 4/8 the rx was going to be sent in  Best number [phone]

## 2019-04-14 NOTE — Telephone Encounter (Signed)
Refills has been as requested. Patient has been notified.  

## 2019-07-12 ENCOUNTER — Telehealth: Payer: Self-pay

## 2019-07-12 NOTE — Telephone Encounter (Signed)
Pt called for refill for albuterol inhaler; pt still has available refill. Advised to use that refill and when gets within a few days of needing med to call pharmacy for refill. Pt voiced understanding.

## 2019-08-02 ENCOUNTER — Telehealth: Payer: Self-pay | Admitting: Primary Care

## 2019-08-02 NOTE — Telephone Encounter (Signed)
Pt dropped off handicap parking placard form to be filled out. Placed in [place].

## 2019-08-02 NOTE — Telephone Encounter (Signed)
Placed in Kate Clark's inbox 

## 2019-08-02 NOTE — Telephone Encounter (Signed)
What is this in regards to? Why is she requesting the handicap placard? Form is in my inbox.

## 2019-08-03 NOTE — Telephone Encounter (Signed)
History of asthma, possible COPD but not on maintenance inhaler. Will defer to PCP

## 2019-08-03 NOTE — Telephone Encounter (Signed)
Pt called back checking on handicap sticker  She stated this for her not to walk as far at work because she gets out of breath.  She stated when it get cold she cannot walk with out getting out of breath she does use her inhaler.  She stated dr Deborra Medina recommend her get this in 2012  Best number [phone]

## 2019-08-08 NOTE — Telephone Encounter (Signed)
Please have patient schedule either a virtual or in office visit with me to discuss. We could treat her symptoms with a better inhaler so she won't struggle during longer walks or during colder seasons.

## 2019-08-08 NOTE — Telephone Encounter (Signed)
Spoken to patient and she stated that she would like to but does not insurance at the moment. She lost her insurance back in May and will not have it again until September when [place] opens up again. She will make an appointment. Also she asked about the form.

## 2019-08-08 NOTE — Telephone Encounter (Signed)
Noted. We will need to address her symptoms of exertional shortness of breath prior to me signing the placard as she may not qualify. We will see her anytime when she is ready.

## 2019-08-09 NOTE — Telephone Encounter (Signed)
Pt scheduled virtual appointment 9/14

## 2019-08-11 NOTE — Telephone Encounter (Signed)
Attempted to contact patient several times at phone number listed below, no answer, voice mail left for patient to return my call.

## 2019-08-11 NOTE — Telephone Encounter (Signed)
Mr Glennon Mac (DPR signed)called to help pt to be clear what pt is needing. Mr Glennon Mac does not understand why pt has to have any type visit to get a handicapped placard signed. Pt last seen 04/05/19. I explained that Gentry Fitz NP wants to do visit to see if any med change might help pt breathing and pt might have less problems walking any distance and also to verify pt meets requirements for a handicap placard. Mr Glennon Mac said pt has an inhaler and just needs the placard form signed. Mr Glennon Mac does not understand why pt should have to pay a copay for this. Pt got on the phone and asked for response to be called to her at [phone].

## 2019-08-14 NOTE — Telephone Encounter (Signed)
Attempted to contact patient, no answer, left voicemail.

## 2019-08-25 NOTE — Telephone Encounter (Signed)
Pt returned kates call and wanted kate to call her 8/31 she is off work  Her insurance has not come into effect yet.  She declined to make an appointment she stated she didn't see why see needed appointment for kate to ask her a question  Best number [phone]

## 2019-08-25 NOTE — Telephone Encounter (Signed)
Noted and will attempt at that time.

## 2019-08-27 ENCOUNTER — Other Ambulatory Visit: Payer: Self-pay | Admitting: Primary Care

## 2019-08-27 DIAGNOSIS — J4520 Mild intermittent asthma, uncomplicated: Secondary | ICD-10-CM

## 2019-08-29 NOTE — Telephone Encounter (Signed)
Last prescribed on 04/14/2019  . Last appointment on 04/05/2019. Next future appointment on 09/11/2019

## 2019-08-29 NOTE — Telephone Encounter (Signed)
Noted.  Refill sent to pharmacy. 

## 2019-09-11 ENCOUNTER — Ambulatory Visit: Payer: Managed Care, Other (non HMO) | Admitting: Primary Care

## 2019-10-01 ENCOUNTER — Other Ambulatory Visit: Payer: Self-pay | Admitting: Primary Care

## 2019-10-01 DIAGNOSIS — J4520 Mild intermittent asthma, uncomplicated: Secondary | ICD-10-CM

## 2019-10-03 ENCOUNTER — Other Ambulatory Visit: Payer: Self-pay | Admitting: Primary Care

## 2019-10-03 DIAGNOSIS — J4520 Mild intermittent asthma, uncomplicated: Secondary | ICD-10-CM

## 2019-10-30 ENCOUNTER — Other Ambulatory Visit: Payer: Self-pay | Admitting: Primary Care

## 2019-10-30 DIAGNOSIS — J4520 Mild intermittent asthma, uncomplicated: Secondary | ICD-10-CM

## 2019-12-07 ENCOUNTER — Telehealth: Payer: Self-pay

## 2019-12-07 NOTE — Telephone Encounter (Signed)
[place] Night - Client Nonclinical Telephone Record AccessNurse Client [place] Night - Client Client Site [place] Physician Alma Friendly - NP Contact Type Call Who Is Calling Patient / Member / Family / Caregiver Caller Name [place] Phone Number [phone] Patient Name Michelle Barnett Patient DOB September 06, 1961 Call Type Message Only Information Provided Reason for Call Request for General Office Information Initial Comment Caller needs to change her appt with NP Allie Bossier on the 12/21. She would like to keep the same day and time but to make her appt virtual instead of in-person. Additional Comment Office hours provided. Please call to confirm. Disp. Time Disposition Final User 12/07/2019 8:05:39 AM General Information Provided Yes Gokounous, Erin Call Closed By: Candy Sledge Transaction Date/Time: 12/07/2019 7:56:32 AM (ET)

## 2019-12-18 ENCOUNTER — Telehealth: Payer: Self-pay

## 2019-12-18 ENCOUNTER — Ambulatory Visit (INDEPENDENT_AMBULATORY_CARE_PROVIDER_SITE_OTHER): Payer: Managed Care, Other (non HMO) | Admitting: Primary Care

## 2019-12-18 ENCOUNTER — Encounter: Payer: Self-pay | Admitting: Primary Care

## 2019-12-18 VITALS — BP 120/65 | Temp 96.2°F | Wt 134.0 lb

## 2019-12-18 DIAGNOSIS — E538 Deficiency of other specified B group vitamins: Secondary | ICD-10-CM

## 2019-12-18 DIAGNOSIS — J4520 Mild intermittent asthma, uncomplicated: Secondary | ICD-10-CM | POA: Diagnosis not present

## 2019-12-18 DIAGNOSIS — I10 Essential (primary) hypertension: Secondary | ICD-10-CM

## 2019-12-18 DIAGNOSIS — K219 Gastro-esophageal reflux disease without esophagitis: Secondary | ICD-10-CM

## 2019-12-18 DIAGNOSIS — J449 Chronic obstructive pulmonary disease, unspecified: Secondary | ICD-10-CM

## 2019-12-18 DIAGNOSIS — D509 Iron deficiency anemia, unspecified: Secondary | ICD-10-CM | POA: Diagnosis not present

## 2019-12-18 NOTE — Assessment & Plan Note (Signed)
Denies GERD symptoms. 

## 2019-12-18 NOTE — Telephone Encounter (Signed)
Her BP earlier today was fine with the older cuff. Have her scheduled for a nurse visit when she comes in for labs. Needs BP check.

## 2019-12-18 NOTE — Telephone Encounter (Signed)
Pt said she bought a new BP cuff today and pt BP was 160/82 P 75. Pt said she feels fine and wanted Gentry Fitz NP to know.

## 2019-12-18 NOTE — Progress Notes (Signed)
Subjective:    Patient ID: Michelle Barnett, female    DOB: [birth year], 58 y.o.   MRN: [id number]  HPI  Virtual Visit via Video Note  I connected with Michelle Barnett on 12/18/19 at  2:20 PM EST by a video enabled telemedicine application and verified that I am speaking with the correct person using two identifiers.  Location: Patient: Home Provider: Office   I discussed the limitations of evaluation and management by telemedicine and the availability of in person appointments. The patient expressed understanding and agreed to proceed.  History of Present Illness:  Michelle Barnett is a 58 year old female with a history of hypertension, COPD, aortic valve disorder, asthma, GERD, anxiety and depression who presents today for follow up and medication refill.  1) Essential Hypertension: Currently managed on lisinopril-HCTZ 10-12.5 mg. She denies chest pain, dizziness, headaches.   BP Readings from Last 3 Encounters:  12/18/19 120/65  12/12/18 (!) 148/94  11/08/17 120/70   2) COPD/Asthma: Currently managed on albuterol inhaler for which she is using as needed during colder weather. She is requesting a handicap placard to use during cooler months. She has shortness of breath when walking long distances during the winter months. She works on [employer] and has to walk a great distance from her car to her job, having to use her inhaler each time she walks to and from her car some days. She doesn't have difficulty during warmer months.   3) Anemia: Chronic, compliant to oral iron once daily. She is taking B12 1-2 times weekly.    Observations/Objective:  Alert and oriented. Appears well, not sickly. No distress. Speaking in complete sentences.   Assessment and Plan:  See problem based charting.  Follow Up Instructions:  Call the main phone line to schedule a lab only appointment.  Use your albuterol inhaler only if needed for shortness of breath. Please call me if you require  use of your inhaler more than three times weekly.  It was a pleasure to see you today! Mayra Reel, NP-C    I discussed the assessment and treatment plan with the patient. The patient was provided an opportunity to ask questions and all were answered. The patient agreed with the plan and demonstrated an understanding of the instructions.   The patient was advised to call back or seek an in-person evaluation if the symptoms worsen or if the condition fails to improve as anticipated.     Doreene Nest, NP    Review of Systems  Eyes: Negative for visual disturbance.  Respiratory:       See HPI  Cardiovascular: Negative for chest pain.  Neurological: Negative for dizziness and headaches.       Past Medical History:  Diagnosis Date  . Asthma   . Bicuspid aortic valve 09/20/2016   a. echo 09/19/16: EF 55-60%, GR1DD, possible bicuspid aortic valve without evidence of AS  . Bronchitis   . Coronary artery disease, non-occlusive    a. cath 09/21/16: ostLM to LM 20%, no evidence of aortic stenosis  . Demand ischemia (HCC) 09/20/2016  . Heart murmur   . Hiatal hernia   . History of blood transfusion   . Hypertension   . Ulcer      Social History   Socioeconomic History  . Marital status: Single    Spouse name: Not on file  . Number of children: Not on file  . Years of education: Not on file  . Highest education level:  Not on file  Occupational History  . Not on file  Tobacco Use  . Smoking status: Never Smoker  . Smokeless tobacco: Never Used  Substance and Sexual Activity  . Alcohol use: No  . Drug use: No  . Sexual activity: Never  Other Topics Concern  . Not on file  Social History Narrative  . Not on file   Social Determinants of Health   Financial Resource Strain:   . Difficulty of Paying Living Expenses: Not on file  Food Insecurity:   . Worried About Charity fundraiser in the Last Year: Not on file  . Ran Out of Food in the Last Year: Not on file   Transportation Needs:   . Lack of Transportation (Medical): Not on file  . Lack of Transportation (Non-Medical): Not on file  Physical Activity:   . Days of Exercise per Week: Not on file  . Minutes of Exercise per Session: Not on file  Stress:   . Feeling of Stress : Not on file  Social Connections:   . Frequency of Communication with Friends and Family: Not on file  . Frequency of Social Gatherings with Friends and Family: Not on file  . Attends Religious Services: Not on file  . Active Member of Clubs or Organizations: Not on file  . Attends Archivist Meetings: Not on file  . Marital Status: Not on file  Intimate Partner Violence:   . Fear of Current or Ex-Partner: Not on file  . Emotionally Abused: Not on file  . Physically Abused: Not on file  . Sexually Abused: Not on file    Past Surgical History:  Procedure Laterality Date  . abdominal tumor    . ABLATION    . CARDIAC CATHETERIZATION N/A 09/21/2016   Procedure: Left Heart Cath and Coronary Angiography;  Surgeon: Wellington Hampshire, MD;  Location: [place] CV LAB;  Service: Cardiovascular;  Laterality: N/A;  . COLONOSCOPY N/A 08/14/2016   Procedure: COLONOSCOPY;  Surgeon: Doran Stabler, MD;  Location[facility] ENDOSCOPY;  Service: Endoscopy;  Laterality: N/A;  . ESOPHAGOGASTRODUODENOSCOPY N/A 08/13/2016   Procedure: ESOPHAGOGASTRODUODENOSCOPY (EGD);  Surgeon: Doran Stabler, MD;  Location: [place];  Service: Gastroenterology;  Laterality: N/A;  . TOOTH EXTRACTION      Family History  Problem Relation Age of Onset  . Stroke Mother   . Hypertension Mother   . Hypertension Father   . Heart disease Father   . Hypertension Brother   . Breast cancer Maternal Aunt     Allergies  Allergen Reactions  . Aspirin Anaphylaxis  . Dairy Aid [Lactase] Swelling and Other (See Comments)    Any dairy products  . Benadryl [Diphenhydramine] Palpitations  . Penicillins Other (See Comments)    Reaction: Unknown     Current Outpatient Medications on File Prior to Visit  Medication Sig Dispense Refill  . albuterol (VENTOLIN HFA) 108 (90 Base) MCG/ACT inhaler INHALE 2 PUFFS BY MOUTH EVERY 6 HOURS AS NEEDED FOR WHEEZING FOR SHORTNESS OF BREATH 9 g 0  . Ascorbic Acid (VITAMIN C) 1000 MG tablet Take 1,000 mg by mouth 2 (two) times daily.     . ferrous sulfate 325 (65 FE) MG tablet Take 650 mg by mouth 2 (two) times daily.     . folic acid (FOLVITE) 557 MCG tablet Take 800 mcg by mouth daily.     Marland Kitchen lisinopril-hydrochlorothiazide (ZESTORETIC) 10-12.5 MG tablet TAKE 1 TABLET BY MOUTH DAILY FOR BLOOD PRESSURE 90  tablet 1  . magnesium oxide (MAG-OX) 400 MG tablet Take 400 mg by mouth daily.    . Multiple Vitamins-Calcium (ONE-A-DAY WOMENS PO) Take 1 tablet by mouth daily.    . Omega-3 Fatty Acids (FISH OIL) 1200 MG CAPS Take 1 capsule by mouth daily.     . vitamin B-12 (CYANOCOBALAMIN) 1000 MCG tablet Take 1,000 mcg by mouth daily.     No current facility-administered medications on file prior to visit.    BP 120/65   Temp (!) 96.2 F (35.7 C) (Temporal)   Wt 134 lb (60.8 kg)   BMI 29.00 kg/m    Objective:   Physical Exam  Constitutional: She is oriented to person, place, and time. She appears well-nourished.  Respiratory: Effort normal.  Neurological: She is alert and oriented to person, place, and time.  Psychiatric: She has a normal mood and affect.           Assessment & Plan:

## 2019-12-18 NOTE — Assessment & Plan Note (Signed)
Repeat CBC pending. 

## 2019-12-18 NOTE — Assessment & Plan Note (Signed)
Compliant to B12 1-2 times weekly, continue same for now. Repeat B12 lab pending.

## 2019-12-18 NOTE — Assessment & Plan Note (Signed)
Difficulty moreso in cooler weather, using albuterol several times weekly when walking greater distances.  Agree to handicap placard to use during cooler/winter months. Continue albuterol PRN. Consider Laba if needed. She will update.

## 2019-12-18 NOTE — Assessment & Plan Note (Signed)
Stable with recent home reading, continue current regimen. CMP pending.

## 2019-12-18 NOTE — Patient Instructions (Signed)
Call the main phone line to schedule a lab only appointment.  Use your albuterol inhaler only if needed for shortness of breath. Please call me if you require use of your inhaler more than three times weekly.  It was a pleasure to see you today! Allie Bossier, NP-C

## 2019-12-19 ENCOUNTER — Other Ambulatory Visit (INDEPENDENT_AMBULATORY_CARE_PROVIDER_SITE_OTHER): Payer: Managed Care, Other (non HMO)

## 2019-12-19 ENCOUNTER — Ambulatory Visit: Payer: Managed Care, Other (non HMO)

## 2019-12-19 ENCOUNTER — Telehealth: Payer: Self-pay

## 2019-12-19 DIAGNOSIS — E538 Deficiency of other specified B group vitamins: Secondary | ICD-10-CM | POA: Diagnosis not present

## 2019-12-19 DIAGNOSIS — D509 Iron deficiency anemia, unspecified: Secondary | ICD-10-CM | POA: Diagnosis not present

## 2019-12-19 DIAGNOSIS — I10 Essential (primary) hypertension: Secondary | ICD-10-CM

## 2019-12-19 LAB — COMPREHENSIVE METABOLIC PANEL
ALT: 13 U/L (ref 0–35)
AST: 20 U/L (ref 0–37)
Albumin: 4.3 g/dL (ref 3.5–5.2)
Alkaline Phosphatase: 55 U/L (ref 39–117)
BUN: 17 mg/dL (ref 6–23)
CO2: 34 mEq/L — ABNORMAL HIGH (ref 19–32)
Calcium: 9.9 mg/dL (ref 8.4–10.5)
Chloride: 98 mEq/L (ref 96–112)
Creatinine, Ser: 0.7 mg/dL (ref 0.40–1.20)
GFR: 85.43 mL/min (ref >60.00)
Glucose, Bld: 86 mg/dL (ref 70–99)
Potassium: 3.5 mEq/L (ref 3.5–5.1)
Sodium: 139 mEq/L (ref 135–145)
Total Bilirubin: 0.5 mg/dL (ref 0.2–1.2)
Total Protein: 7.2 g/dL (ref 6.0–8.3)

## 2019-12-19 LAB — CBC
HCT: 44 % (ref 36.0–46.0)
Hemoglobin: 14.1 g/dL (ref 12.0–15.0)
MCHC: 32.2 g/dL (ref 30.0–36.0)
MCV: 95.1 fl (ref 78.0–100.0)
Platelets: 195 10*3/uL (ref 150.0–400.0)
RBC: 4.63 Mil/uL (ref 3.87–5.11)
RDW: 14.1 % (ref 11.5–15.5)
WBC: 4.7 10*3/uL (ref 4.0–10.5)

## 2019-12-19 LAB — IBC + FERRITIN
Ferritin: 85.6 ng/mL (ref 10.0–291.0)
Iron: 94 ug/dL (ref 42–145)
Saturation Ratios: 27.7 % (ref 20.0–50.0)
Transferrin: 242 mg/dL (ref 212.0–360.0)

## 2019-12-19 LAB — VITAMIN B12: Vitamin B-12: 540 pg/mL (ref 211–911)

## 2019-12-19 NOTE — Telephone Encounter (Signed)
Pt had virtual visit on 12/18/19; pt was supposed to have BP ck when in lab today but was not done and nurse visit scheduled for 12/20/19 at 11:15; Leafy Ro RN said to schedule nurse visit but send note to [facility] about no chg for visit per Allie Bossier NP.

## 2019-12-20 ENCOUNTER — Ambulatory Visit: Payer: Managed Care, Other (non HMO)

## 2019-12-20 ENCOUNTER — Other Ambulatory Visit: Payer: Self-pay

## 2019-12-20 DIAGNOSIS — I10 Essential (primary) hypertension: Secondary | ICD-10-CM

## 2019-12-20 NOTE — Progress Notes (Signed)
Pt came to office for BP check and reports she took BP meds this morning around 7am  BP--144 96 HR-- 75

## 2019-12-21 NOTE — Telephone Encounter (Signed)
Office Manager, Coleman has placed a message to be sure patient is not charged for nurse visit DOS on 12/20/19.  Thanks.

## 2019-12-27 ENCOUNTER — Other Ambulatory Visit: Payer: Self-pay | Admitting: Primary Care

## 2019-12-27 DIAGNOSIS — J4520 Mild intermittent asthma, uncomplicated: Secondary | ICD-10-CM

## 2020-01-26 ENCOUNTER — Telehealth: Payer: Self-pay

## 2020-01-26 DIAGNOSIS — J4520 Mild intermittent asthma, uncomplicated: Secondary | ICD-10-CM

## 2020-01-26 DIAGNOSIS — J4530 Mild persistent asthma, uncomplicated: Secondary | ICD-10-CM

## 2020-01-26 NOTE — Telephone Encounter (Signed)
How often is she using the albuterol inhaler?  Daily? 2-3 times daily? Weekly?  She should really be using this just as needed, not daily. If she has shortness of breath requiring daily use then she needs a different inhaler.

## 2020-01-26 NOTE — Telephone Encounter (Signed)
Last prescribed on 12/27/2019 . Last appointment on 12/18/2019. No future appointment

## 2020-01-27 ENCOUNTER — Other Ambulatory Visit: Payer: Self-pay

## 2020-01-27 DIAGNOSIS — J4520 Mild intermittent asthma, uncomplicated: Secondary | ICD-10-CM

## 2020-01-29 NOTE — Telephone Encounter (Signed)
Spoken and notified patient of Michelle Barnett comments.   Per patient, she was told when she was at the hospital that when she is out of breathe and when it is cold outside to use the inhaler.  Patient stated that she is willing to try a daily inhaler and to use the albuterol inhaler only as needed.

## 2020-01-30 NOTE — Telephone Encounter (Signed)
Message left for patient to return my call.  

## 2020-01-30 NOTE — Telephone Encounter (Signed)
Please clarify, is she using the albuterol everyday? If so then tell her that I'm sending a Symbicort inhaler to her pharmacy. 1 puff BID EVERYDAY. Use albuterol only as needed.

## 2020-01-30 NOTE — Telephone Encounter (Signed)
Patient returned your call She stated she will not be able to talk until after 4 because she is at work

## 2020-01-30 NOTE — Telephone Encounter (Signed)
Patient returned Chan's call and Johny Drilling was gone for the day. I let patient know Kate's comments.  Patient said she uses Albuterol everyday when she needs it.  I let her know the Symbicort was sent to her pharmacy.

## 2020-01-31 NOTE — Telephone Encounter (Signed)
I'd really like to meet with her virtually to discuss this as I have several additional questions. Please try to get her to schedule. If she refuses then please ask the questions below.  Please notify patient that it looks like her insurance will cover Advair not Symbicort.  I have a few additional questions.  1. Was she formally diagnosed with asthma? 2. Does she smoke? On her chart is says never smoker. I think she does smoke so please update how long and how many PPD. 3. How often does she NEED her albuterol? The comment below says she uses this "everyday when she needs it".  4. Every been on any other inhalers besides albuterol?

## 2020-02-01 ENCOUNTER — Other Ambulatory Visit: Payer: Self-pay | Admitting: Primary Care

## 2020-02-01 DIAGNOSIS — J4520 Mild intermittent asthma, uncomplicated: Secondary | ICD-10-CM

## 2020-02-01 NOTE — Telephone Encounter (Signed)
Patient returned Chan's call. Patient's requesting the Albuterol be refilled.  Patient said she's never smoked.  Patient said it's in the air.

## 2020-02-01 NOTE — Telephone Encounter (Signed)
Per DPR, left detail message of Kate Clark's comments for patient to call back 

## 2020-02-01 NOTE — Telephone Encounter (Signed)
Message left for patient to return my call.  

## 2020-02-02 ENCOUNTER — Other Ambulatory Visit: Payer: Self-pay

## 2020-02-02 DIAGNOSIS — J4520 Mild intermittent asthma, uncomplicated: Secondary | ICD-10-CM

## 2020-02-02 MED ORDER — ALBUTEROL SULFATE HFA 108 (90 BASE) MCG/ACT IN AERS: 1.0000 | INHALATION_SPRAY | Freq: Four times a day (QID) | RESPIRATORY_TRACT | 0 refills | Status: DC | PRN

## 2020-02-02 NOTE — Telephone Encounter (Signed)
Patient returned Chan's call. She stated that she goes into work alittle after 9 and will get off at 3, she can not talk in between those times .

## 2020-02-02 NOTE — Telephone Encounter (Signed)
Electronic refill request Albuterol Last office visit 12/18/19 Last refill 12/17/19 9G No upcoming appointment scheduled

## 2020-02-02 NOTE — Telephone Encounter (Signed)
Refill sent to pharmacy. See result note. Is she agreeable to pulmonology? I can't continue to refill inhaler without formal evaluation.

## 2020-02-02 NOTE — Telephone Encounter (Addendum)
Spoken to patient.  1. Was she formally diagnosed with asthma?         No but believe this was disagnoded from the hopstial back in 2012.  2. Does she smoke? On her chart is says never smoker. I think she does smoke so please update how long and how many PPD.        Never smoker   3. How often does she NEED her albuterol? The comment below says she uses this "everyday when she needs it".          Patient stated that every morning 1-2 puff  4. Every been on any other inhalers besides albuterol?          No

## 2020-02-02 NOTE — Telephone Encounter (Signed)
Please notify patient that she needs to see a pulmonologist for formal diagnosis including spirometry/PFT's. I'd like to refer her for evaluation, is she agreeable? I cannot continue to refill her inhaler if we don't have a diagnosis.

## 2020-02-06 MED ORDER — FLOVENT HFA 110 MCG/ACT IN AERO: 1.0000 | INHALATION_SPRAY | Freq: Two times a day (BID) | RESPIRATORY_TRACT | 0 refills | Status: DC

## 2020-02-06 NOTE — Telephone Encounter (Signed)
Patient has decline on the referral.

## 2020-02-06 NOTE — Telephone Encounter (Signed)
Message left for patient to return my call on 02/05/2020 and 02/06/2020

## 2020-02-06 NOTE — Telephone Encounter (Signed)
Noted.   I'm sending in a Flovent inhaler for her to use daily for Asthma. 1 puff into the lungs twice daily. I don't see in formal testing for Asthma in her chart but it is mentioned a few times.  Please have her update me in 2 weeks.

## 2020-02-07 NOTE — Telephone Encounter (Signed)
Message left for patient to return my call.  

## 2020-02-09 NOTE — Telephone Encounter (Signed)
Message left for patient to return my call.  

## 2020-02-26 ENCOUNTER — Other Ambulatory Visit: Payer: Self-pay | Admitting: Primary Care

## 2020-02-26 DIAGNOSIS — I10 Essential (primary) hypertension: Secondary | ICD-10-CM

## 2020-03-28 NOTE — Telephone Encounter (Signed)
Pt had a visit with Allayne Gitelman NP on 12/18/19.

## 2020-05-28 ENCOUNTER — Other Ambulatory Visit: Payer: Self-pay | Admitting: Primary Care

## 2020-05-28 DIAGNOSIS — I10 Essential (primary) hypertension: Secondary | ICD-10-CM

## 2020-05-28 DIAGNOSIS — J4520 Mild intermittent asthma, uncomplicated: Secondary | ICD-10-CM

## 2020-05-28 NOTE — Telephone Encounter (Signed)
Ok to refill. Last prescribed on 02/02/2020. Patient did not call back regarding this and Flovent.Last OV on 12/18/2019. No future OV scheduled

## 2020-05-28 NOTE — Telephone Encounter (Signed)
Please attempt to contact patient regarding:  1. Did she ever pick up the Flovent inhaler (intended for daily use) 2. How often is she using the albuterol inhaler? It seems like infrequent use.

## 2020-05-28 NOTE — Telephone Encounter (Signed)
Message left for patient to return my call.  

## 2020-05-30 NOTE — Telephone Encounter (Signed)
Refills sent to pharmacy. 

## 2020-05-30 NOTE — Telephone Encounter (Signed)
Patient stated that she is using the Flovent inhaler but not as she should due to she cannot rinse all the time due to work.  Patient stated that she is trying to use the albuterol inhaler less as she told to. She is running low but out yet so she asking for a refill

## 2020-09-29 ENCOUNTER — Other Ambulatory Visit: Payer: Self-pay

## 2020-09-29 DIAGNOSIS — J4520 Mild intermittent asthma, uncomplicated: Secondary | ICD-10-CM

## 2020-09-29 DIAGNOSIS — J4530 Mild persistent asthma, uncomplicated: Secondary | ICD-10-CM

## 2020-10-03 MED ORDER — ALBUTEROL SULFATE HFA 108 (90 BASE) MCG/ACT IN AERS: 2.0000 | INHALATION_SPRAY | Freq: Four times a day (QID) | RESPIRATORY_TRACT | 0 refills | Status: DC | PRN

## 2020-10-03 MED ORDER — FLOVENT HFA 110 MCG/ACT IN AERO: 1.0000 | INHALATION_SPRAY | Freq: Two times a day (BID) | RESPIRATORY_TRACT | 0 refills | Status: DC

## 2020-11-23 ENCOUNTER — Other Ambulatory Visit: Payer: Self-pay | Admitting: Primary Care

## 2020-11-23 DIAGNOSIS — I10 Essential (primary) hypertension: Secondary | ICD-10-CM

## 2020-12-25 ENCOUNTER — Other Ambulatory Visit: Payer: Self-pay | Admitting: Primary Care

## 2020-12-25 DIAGNOSIS — J4530 Mild persistent asthma, uncomplicated: Secondary | ICD-10-CM

## 2020-12-25 DIAGNOSIS — J4520 Mild intermittent asthma, uncomplicated: Secondary | ICD-10-CM

## 2020-12-30 NOTE — Telephone Encounter (Signed)
Scheduled for follow up 1/10.

## 2021-01-06 ENCOUNTER — Ambulatory Visit: Payer: Managed Care, Other (non HMO) | Admitting: Primary Care

## 2021-05-21 ENCOUNTER — Ambulatory Visit (INDEPENDENT_AMBULATORY_CARE_PROVIDER_SITE_OTHER): Payer: No Typology Code available for payment source | Admitting: Primary Care

## 2021-05-21 ENCOUNTER — Encounter: Payer: Self-pay | Admitting: Primary Care

## 2021-05-21 ENCOUNTER — Other Ambulatory Visit: Payer: Self-pay

## 2021-05-21 ENCOUNTER — Other Ambulatory Visit (HOSPITAL_COMMUNITY)
Admission: RE | Admit: 2021-05-21 | Discharge: 2021-05-21 | Disposition: A | Discharge status: Home / Self Care | Payer: No Typology Code available for payment source | Source: Ambulatory Visit | Attending: Primary Care | Admitting: Primary Care

## 2021-05-21 VITALS — BP 130/82 | HR 90 | Temp 97.0°F | Ht <= 58 in | Wt 123.0 lb

## 2021-05-21 DIAGNOSIS — R011 Cardiac murmur, unspecified: Secondary | ICD-10-CM

## 2021-05-21 DIAGNOSIS — Z124 Encounter for screening for malignant neoplasm of cervix: Secondary | ICD-10-CM | POA: Diagnosis not present

## 2021-05-21 DIAGNOSIS — Q231 Congenital insufficiency of aortic valve: Secondary | ICD-10-CM

## 2021-05-21 DIAGNOSIS — E538 Deficiency of other specified B group vitamins: Secondary | ICD-10-CM

## 2021-05-21 DIAGNOSIS — D509 Iron deficiency anemia, unspecified: Secondary | ICD-10-CM

## 2021-05-21 DIAGNOSIS — Z0000 Encounter for general adult medical examination without abnormal findings: Secondary | ICD-10-CM

## 2021-05-21 DIAGNOSIS — J449 Chronic obstructive pulmonary disease, unspecified: Secondary | ICD-10-CM | POA: Diagnosis not present

## 2021-05-21 DIAGNOSIS — K219 Gastro-esophageal reflux disease without esophagitis: Secondary | ICD-10-CM | POA: Diagnosis not present

## 2021-05-21 DIAGNOSIS — Z1231 Encounter for screening mammogram for malignant neoplasm of breast: Secondary | ICD-10-CM

## 2021-05-21 DIAGNOSIS — I10 Essential (primary) hypertension: Secondary | ICD-10-CM

## 2021-05-21 DIAGNOSIS — Z23 Encounter for immunization: Secondary | ICD-10-CM | POA: Diagnosis not present

## 2021-05-21 DIAGNOSIS — F4323 Adjustment disorder with mixed anxiety and depressed mood: Secondary | ICD-10-CM

## 2021-05-21 LAB — IBC + FERRITIN
Ferritin: 128.6 ng/mL (ref 10.0–291.0)
Iron: 81 ug/dL (ref 42–145)
Saturation Ratios: 24.8 % (ref 20.0–50.0)
Transferrin: 233 mg/dL (ref 212.0–360.0)

## 2021-05-21 LAB — COMPREHENSIVE METABOLIC PANEL
ALT: 14 U/L (ref 0–35)
AST: 21 U/L (ref 0–37)
Albumin: 4.3 g/dL (ref 3.5–5.2)
Alkaline Phosphatase: 46 U/L (ref 39–117)
BUN: 16 mg/dL (ref 6–23)
CO2: 34 mEq/L — ABNORMAL HIGH (ref 19–32)
Calcium: 9.8 mg/dL (ref 8.4–10.5)
Chloride: 99 mEq/L (ref 96–112)
Creatinine, Ser: 0.56 mg/dL (ref 0.40–1.20)
GFR: 98.69 mL/min (ref >60.00)
Glucose, Bld: 84 mg/dL (ref 70–99)
Potassium: 3.2 mEq/L — ABNORMAL LOW (ref 3.5–5.1)
Sodium: 140 mEq/L (ref 135–145)
Total Bilirubin: 0.6 mg/dL (ref 0.2–1.2)
Total Protein: 7.1 g/dL (ref 6.0–8.3)

## 2021-05-21 LAB — LIPID PANEL
Cholesterol: 269 mg/dL — ABNORMAL HIGH (ref 0–200)
HDL: 80.5 mg/dL (ref >39.00)
LDL Cholesterol: 175 mg/dL — ABNORMAL HIGH (ref 0–99)
NonHDL: 188.5
Total CHOL/HDL Ratio: 3
Triglycerides: 66 mg/dL (ref 0.0–149.0)
VLDL: 13.2 mg/dL (ref 0.0–40.0)

## 2021-05-21 LAB — CBC
HCT: 42.7 % (ref 36.0–46.0)
Hemoglobin: 14.1 g/dL (ref 12.0–15.0)
MCHC: 33.1 g/dL (ref 30.0–36.0)
MCV: 93.9 fl (ref 78.0–100.0)
Platelets: 202 10*3/uL (ref 150.0–400.0)
RBC: 4.54 Mil/uL (ref 3.87–5.11)
RDW: 14.2 % (ref 11.5–15.5)
WBC: 4.5 10*3/uL (ref 4.0–10.5)

## 2021-05-21 LAB — HEMOGLOBIN A1C: Hgb A1c MFr Bld: 5.6 % (ref 4.6–6.5)

## 2021-05-21 LAB — VITAMIN B12: Vitamin B-12: 1287 pg/mL — ABNORMAL HIGH (ref 211–911)

## 2021-05-21 NOTE — Assessment & Plan Note (Signed)
Intermittent symptoms, typically during colder and warmer months. No use of Flovent really every. Infrequent use of albuterol.   Continue PRN albuterol inhaler.  Discussed to notify if symptoms become worse.

## 2021-05-21 NOTE — Assessment & Plan Note (Signed)
Denies concerns today. °Continue to monitor. °

## 2021-05-21 NOTE — Patient Instructions (Addendum)
Stop by the lab prior to leaving today. I will notify you of your results once received.   [facility] to [facility] your [facility].   You will be contacted regarding your echocardiogram.  Please let us know if you have not been contacted within two weeks.   Schedule a nurse visit for 2-6 months to repeat your Shingrix vaccine.   It was a pleasure to see you today!   Preventive Care 27-61 Years Old, Female Preventive care refers to lifestyle choices and visits with your health care provider that can promote health and wellness. This includes:  A yearly physical exam. This is also called an annual wellness visit.  Regular dental and eye exams.  Immunizations.  Screening for certain conditions.  Healthy lifestyle choices, such as: ? Eating a healthy diet. ? Getting regular exercise. ? Not using drugs or products that contain nicotine and tobacco. ? Limiting alcohol use. What can I expect for my preventive care visit? Physical exam Your health care provider will check your:  Height and weight. These may be used to calculate your BMI (body mass index). BMI is a measurement that tells if you are at a healthy weight.  Heart rate and blood pressure.  Body temperature.  Skin for abnormal spots. Counseling Your health care provider may ask you questions about your:  Past medical problems.  Family's medical history.  Alcohol, tobacco, and drug use.  Emotional well-being.  Home life and relationship well-being.  Sexual activity.  Diet, exercise, and sleep habits.  Work and work Statistician.  Access to firearms.  Method of birth control.  Menstrual cycle.  Pregnancy history. What immunizations do I need? Vaccines are usually given at various ages, according to a schedule. Your health care provider will recommend vaccines for you based on your age, medical history, and lifestyle or other factors, such as travel or where you work.   What tests do I  need? Blood tests  Lipid and cholesterol levels. These may be checked every 5 years, or more often if you are over 65 years old.  Hepatitis C test.  Hepatitis B test. Screening  Lung cancer screening. You may have this screening every year starting at age 70 if you have a 30-pack-year history of smoking and currently smoke or have quit within the past 15 years.  Colorectal cancer screening. ? All adults should have this screening starting at age 9 and continuing until age 66. ? Your health care provider may recommend screening at age 66 if you are at increased risk. ? You will have tests every 1-10 years, depending on your results and the type of screening test.  Diabetes screening. ? This is done by checking your blood sugar (glucose) after you have not eaten for a while (fasting). ? You may have this done every 1-3 years.  Mammogram. ? This may be done every 1-2 years. ? Talk with your health care provider about when you should start having regular mammograms. This may depend on whether you have a family history of breast cancer.  BRCA-related cancer screening. This may be done if you have a family history of breast, ovarian, tubal, or peritoneal cancers.  Pelvic exam and Pap test. ? This may be done every 3 years starting at age 11. ? Starting at age 87, this may be done every 5 years if you have a Pap test in combination with an HPV test. Other tests  STD (sexually transmitted disease) testing, if you are at risk.  Bone density scan. This is done to screen for osteoporosis. You may have this scan if you are at high risk for osteoporosis. Talk with your health care provider about your test results, treatment options, and if necessary, the need for more tests. Follow these instructions at home: Eating and drinking  Eat a diet that includes fresh fruits and vegetables, whole grains, lean protein, and low-fat dairy products.  Take vitamin and mineral supplements as  recommended by your health care provider.  Do not drink alcohol if: ? Your health care provider tells you not to drink. ? You are pregnant, may be pregnant, or are planning to become pregnant.  If you drink alcohol: ? Limit how much you have to 0-1 drink a day. ? Be aware of how much alcohol is in your drink. In the [place] one drink equals one 12 oz bottle of beer (355 mL), one 5 oz glass of wine (148 mL), or one 1 oz glass of hard liquor (44 mL).   Lifestyle  Take daily care of your teeth and gums. Brush your teeth every morning and night with fluoride toothpaste. Floss one time each day.  Stay active. Exercise for at least 30 minutes 5 or more days each week.  Do not use any products that contain nicotine or tobacco, such as cigarettes, e-cigarettes, and chewing tobacco. If you need help quitting, ask your health care provider.  Do not use drugs.  If you are sexually active, practice safe sex. Use a condom or other form of protection to prevent STIs (sexually transmitted infections).  If you do not wish to become pregnant, use a form of birth control. If you plan to become pregnant, see your health care provider for a prepregnancy visit.  If told by your health care provider, take low-dose aspirin daily starting at age 72.  Find healthy ways to cope with stress, such as: ? Meditation, yoga, or listening to music. ? Journaling. ? Talking to a trusted person. ? Spending time with friends and family. Safety  Always wear your seat belt while driving or riding in a vehicle.  Do not drive: ? If you have been drinking alcohol. Do not ride with someone who has been drinking. ? When you are tired or distracted. ? While texting.  Wear a helmet and other protective equipment during sports activities.  If you have firearms in your house, make sure you follow all gun safety procedures. What's next?  Visit your health care provider once a year for an annual wellness visit.  Ask your  health care provider how often you should have your eyes and teeth checked.  Stay up to date on all vaccines. This information is not intended to replace advice given to you by your health care provider. Make sure you discuss any questions you have with your health care provider. Document Revised: 09/17/2020 Document Reviewed: 08/25/2018 Elsevier Patient Education  2021 [employer].

## 2021-05-21 NOTE — Assessment & Plan Note (Signed)
Controled in the office today, continue lisinopril-HCTZ 10-12.5 mg. Labs pending.

## 2021-05-21 NOTE — Assessment & Plan Note (Signed)
Noted on exam today, chronic. Echocardiogram from 2017 reviewed, repeat echocardiogram due and pending.

## 2021-05-21 NOTE — Assessment & Plan Note (Signed)
Repeat iron studies pending. 

## 2021-05-21 NOTE — Progress Notes (Signed)
Subjective:    Patient ID: Michelle Barnett, female    DOB: 1961-03-07, 60 y.o.   MRN: [id number]  HPI  TOSHIBA NULL is a very pleasant 60 y.o. female with a history of hypertension, demand ischemia, COPD, iron deficiency anemia, anxiety and depression who presents today for complete physical and follow up of chronic conditions. She has not been seen since 2020.  She is also requesting a handicap placard as she cannot walk long distances in the heat or cold weather which causes shortness of breath. Her handicap placard expired years ago. She has a history of bicuspid aortic valve, last echocardiogram was in 2017, it was recommended this be repeated in 2018 for which was not.   Immunizations: -Tetanus: Unsure.  -Influenza: Did not complete last season  -Covid-19: Three vaccines -Shingles: Never completed  -Pneumonia:  2017   Diet: She endorses a healthy diet.   Exercise: No regular exercise.   Eye exam: Completes annually  Dental exam: Completes semi-annually   Pap Smear:  2017, due today Mammogram:  2017 Colonoscopy: 2017, due in 2027  BP Readings from Last 3 Encounters:  05/21/21 130/82  12/18/19 120/65  12/12/18 (!) 148/94   Wt Readings from Last 3 Encounters:  05/21/21 123 lb (55.8 kg)  12/18/19 134 lb (60.8 kg)  12/12/18 141 lb 8 oz (64.2 kg)      Review of Systems  Constitutional: Negative for unexpected weight change.  HENT: Negative for rhinorrhea.   Eyes: Negative for visual disturbance.  Respiratory: Positive for shortness of breath. Negative for cough.        Chronic and exertional dyspnea.   Cardiovascular: Negative for chest pain.  Gastrointestinal: Negative for constipation and diarrhea.  Genitourinary: Negative for difficulty urinating.  Musculoskeletal: Negative for arthralgias and myalgias.  Skin: Negative for rash.  Allergic/Immunologic: Negative for environmental allergies.  Neurological: Negative for dizziness, numbness and headaches.   Psychiatric/Behavioral: The patient is not nervous/anxious.          Past Medical History:  Diagnosis Date  . Asthma   . Bicuspid aortic valve 09/20/2016   a. echo 09/19/16: EF 55-60%, GR1DD, possible bicuspid aortic valve without evidence of AS  . Bronchitis   . Coronary artery disease, non-occlusive    a. cath 09/21/16: ostLM to LM 20%, no evidence of aortic stenosis  . Demand ischemia (HCC) 09/20/2016  . Heart murmur   . Hiatal hernia   . History of blood transfusion   . Hypertension   . Ulcer     Social History   Socioeconomic History  . Marital status: Single    Spouse name: Not on file  . Number of children: Not on file  . Years of education: Not on file  . Highest education level: Not on file  Occupational History  . Not on file  Tobacco Use  . Smoking status: Never Smoker  . Smokeless tobacco: Never Used  Substance and Sexual Activity  . Alcohol use: No  . Drug use: No  . Sexual activity: Never  Other Topics Concern  . Not on file  Social History Narrative  . Not on file   Social Determinants of Health   Financial Resource Strain: Not on file  Food Insecurity: Not on file  Transportation Needs: Not on file  Physical Activity: Not on file  Stress: Not on file  Social Connections: Not on file  Intimate Partner Violence: Not on file    Past Surgical History:  Procedure Laterality Date  .  abdominal tumor    . ABLATION    . CARDIAC CATHETERIZATION N/A 09/21/2016   Procedure: Left Heart Cath and Coronary Angiography;  Surgeon: Iran Ouch, MD;  Location: [facility] INVASIVE CV LAB;  Service: Cardiovascular;  Laterality: N/A;  . COLONOSCOPY N/A 08/14/2016   Procedure: COLONOSCOPY;  Surgeon: Sherrilyn Rist, MD;  Location: [facility] ENDOSCOPY;  Service: Endoscopy;  Laterality: N/A;  . ESOPHAGOGASTRODUODENOSCOPY N/A 08/13/2016   Procedure: ESOPHAGOGASTRODUODENOSCOPY (EGD);  Surgeon: Sherrilyn Rist, MD;  Location: [facility] ENDOSCOPY;  Service: Gastroenterology;   Laterality: N/A;  . TOOTH EXTRACTION      Family History  Problem Relation Age of Onset  . Stroke Mother   . Hypertension Mother   . Hypertension Father   . Heart disease Father   . Hypertension Brother   . Breast cancer Maternal Aunt     Allergies  Allergen Reactions  . Aspirin Anaphylaxis  . Dairy Aid [Lactase] Swelling and Other (See Comments)    Any dairy products  . Benadryl [Diphenhydramine] Palpitations  . Penicillins Other (See Comments)    Reaction: Unknown    Current Outpatient Medications on File Prior to Visit  Medication Sig Dispense Refill  . albuterol (VENTOLIN HFA) 108 (90 Base) MCG/ACT inhaler INHALE 2 PUFFS BY MOUTH EVERY 6 HOURS AS NEEDED FOR WHEEZING FOR SHORTNESS OF BREATH 9 g 0  . Ascorbic Acid (VITAMIN C) 1000 MG tablet Take 1,000 mg by mouth 2 (two) times daily.     . ferrous sulfate 325 (65 FE) MG tablet Take 650 mg by mouth 2 (two) times daily.     Marland Kitchen lisinopril-hydrochlorothiazide (ZESTORETIC) 10-12.5 MG tablet Take 1 tablet by mouth once daily for blood pressure 90 tablet 1  . magnesium oxide (MAG-OX) 400 MG tablet Take 400 mg by mouth daily.    . Multiple Vitamins-Calcium (ONE-A-DAY WOMENS PO) Take 1 tablet by mouth daily.    . Omega-3 Fatty Acids (FISH OIL) 1200 MG CAPS Take 1 capsule by mouth daily.     . vitamin B-12 (CYANOCOBALAMIN) 1000 MCG tablet Take 1,000 mcg by mouth daily.     No current facility-administered medications on file prior to visit.    BP 130/82 (BP Location: Left Arm, Patient Position: Sitting, Cuff Size: Normal)   Pulse 90   Temp (!) 97 F (36.1 C) (Temporal)   Ht 4\' 9"  (1.448 m)   Wt 123 lb (55.8 kg)   SpO2 94%   BMI 26.62 kg/m  Objective:   Physical Exam HENT:     Right Ear: Tympanic membrane and ear canal normal.     Left Ear: Tympanic membrane and ear canal normal.     Nose: Nose normal.  Eyes:     Conjunctiva/sclera: Conjunctivae normal.     Pupils: Pupils are equal, round, and reactive to light.  Neck:      Thyroid: No thyromegaly.  Cardiovascular:     Rate and Rhythm: Normal rate and regular rhythm.     Heart sounds: No murmur heard.   Pulmonary:     Effort: Pulmonary effort is normal.     Breath sounds: Normal breath sounds. No rales.  Abdominal:     General: Bowel sounds are normal.     Palpations: Abdomen is soft.     Tenderness: There is no abdominal tenderness.  Genitourinary:    Labia:        Right: No tenderness or lesion.        Left: No tenderness or lesion.  Cervix: No cervical motion tenderness or discharge.     Uterus: Normal.      Adnexa: Right adnexa normal and left adnexa normal.  Musculoskeletal:        General: Normal range of motion.     Cervical back: Neck supple.  Lymphadenopathy:     Cervical: No cervical adenopathy.  Skin:    General: Skin is warm and dry.     Findings: No rash.  Neurological:     Mental Status: She is alert and oriented to person, place, and time.     Cranial Nerves: No cranial nerve deficit.     Deep Tendon Reflexes: Reflexes are normal and symmetric.           Assessment & Plan:      This visit occurred during the SARS-CoV-2 public health emergency.  Safety protocols were in place, including screening questions prior to the visit, additional usage of staff PPE, and extensive cleaning of exam room while observing appropriate contact time as indicated for disinfecting solutions.

## 2021-05-21 NOTE — Assessment & Plan Note (Signed)
Shingrix and tetanus due, provided today. Pap smear due, completed today. Mammogram overdue, orders placed. Colonoscopy UTD, due in 2027.  Exam today stable. Labs pending.

## 2021-05-21 NOTE — Assessment & Plan Note (Signed)
Repeat B12 level pending. 

## 2021-05-21 NOTE — Assessment & Plan Note (Signed)
Denies concerns today. Tries to avoid triggers.

## 2021-05-22 ENCOUNTER — Other Ambulatory Visit: Payer: Self-pay | Admitting: Primary Care

## 2021-05-22 ENCOUNTER — Telehealth: Payer: Self-pay | Admitting: *Deleted

## 2021-05-22 DIAGNOSIS — E785 Hyperlipidemia, unspecified: Secondary | ICD-10-CM

## 2021-05-22 DIAGNOSIS — E876 Hypokalemia: Secondary | ICD-10-CM

## 2021-05-22 DIAGNOSIS — I10 Essential (primary) hypertension: Secondary | ICD-10-CM

## 2021-05-22 LAB — CYTOLOGY - PAP
Comment: NEGATIVE
Diagnosis: NEGATIVE
High risk HPV: NEGATIVE

## 2021-05-22 MED ORDER — POTASSIUM CHLORIDE CRYS ER 20 MEQ PO TBCR: 20.0000 meq | EXTENDED_RELEASE_TABLET | Freq: Two times a day (BID) | ORAL | 0 refills | Status: DC

## 2021-05-22 MED ORDER — ATORVASTATIN CALCIUM 20 MG PO TABS: 20.0000 mg | ORAL_TABLET | Freq: Every day | ORAL | 3 refills | Status: DC

## 2021-05-22 NOTE — Telephone Encounter (Signed)
Please thank patient for getting back to me. I sent a prescription for atorvastatin 20 mg to her pharmacy. Take 1 tablet once daily. We need to recheck her cholesterol in a few months, okay to wait until she regains insurance. Please go ahead and schedule a lab appointment for that time

## 2021-05-22 NOTE — Telephone Encounter (Signed)
Michelle Barnett left voicemail on triage stating [name] wanted to start her on cholesterol medication.  She is agreeable and states prescription can be sent to [facility] on [address]

## 2021-05-23 NOTE — Telephone Encounter (Signed)
Patient has been notified and appointment has been schedule.

## 2021-05-27 ENCOUNTER — Telehealth: Payer: Self-pay

## 2021-05-27 NOTE — Telephone Encounter (Signed)
Spoke with Ladona Ridgel at [facility] office and was advised as long as the order is not closed it will stay active in their defer WQ and they will call patient at the requested time to reschedule. Do not need to re order.

## 2021-05-27 NOTE — Telephone Encounter (Signed)
Noted, that's fine. Will they schedule her for that time or do we have to place the order closer to her preferred time?

## 2021-05-27 NOTE — Telephone Encounter (Signed)
Noted  

## 2021-05-27 NOTE — Telephone Encounter (Signed)
Cardiology office reached out to the patient to scheduled Echo and patient advised them that she wanted to wait till September when her insurance gets re activated.  FYI to PCP

## 2021-09-22 ENCOUNTER — Other Ambulatory Visit: Payer: Self-pay

## 2021-09-22 ENCOUNTER — Other Ambulatory Visit (INDEPENDENT_AMBULATORY_CARE_PROVIDER_SITE_OTHER): Payer: No Typology Code available for payment source

## 2021-09-22 ENCOUNTER — Other Ambulatory Visit: Payer: No Typology Code available for payment source

## 2021-09-22 DIAGNOSIS — E785 Hyperlipidemia, unspecified: Secondary | ICD-10-CM

## 2021-09-22 LAB — HEPATIC FUNCTION PANEL
ALT: 12 U/L (ref 0–35)
AST: 19 U/L (ref 0–37)
Albumin: 4.3 g/dL (ref 3.5–5.2)
Alkaline Phosphatase: 54 U/L (ref 39–117)
Bilirubin, Direct: 0.1 mg/dL (ref 0.0–0.3)
Total Bilirubin: 0.5 mg/dL (ref 0.2–1.2)
Total Protein: 6.7 g/dL (ref 6.0–8.3)

## 2021-09-22 LAB — LIPID PANEL
Cholesterol: 252 mg/dL — ABNORMAL HIGH (ref 0–200)
HDL: 100.2 mg/dL (ref >39.00)
LDL Cholesterol: 144 mg/dL — ABNORMAL HIGH (ref 0–99)
NonHDL: 151.74
Total CHOL/HDL Ratio: 3
Triglycerides: 38 mg/dL (ref 0.0–149.0)
VLDL: 7.6 mg/dL (ref 0.0–40.0)

## 2021-11-19 ENCOUNTER — Other Ambulatory Visit: Payer: Self-pay | Admitting: Primary Care

## 2021-11-19 DIAGNOSIS — J4520 Mild intermittent asthma, uncomplicated: Secondary | ICD-10-CM

## 2021-11-19 DIAGNOSIS — J4530 Mild persistent asthma, uncomplicated: Secondary | ICD-10-CM

## 2022-05-02 ENCOUNTER — Other Ambulatory Visit: Payer: Self-pay | Admitting: Primary Care

## 2022-05-02 DIAGNOSIS — E876 Hypokalemia: Secondary | ICD-10-CM

## 2022-05-05 ENCOUNTER — Other Ambulatory Visit: Payer: Self-pay | Admitting: Primary Care

## 2022-05-05 DIAGNOSIS — I10 Essential (primary) hypertension: Secondary | ICD-10-CM

## 2022-05-05 DIAGNOSIS — E785 Hyperlipidemia, unspecified: Secondary | ICD-10-CM

## 2022-05-07 ENCOUNTER — Other Ambulatory Visit: Payer: Self-pay | Admitting: Primary Care

## 2022-05-07 DIAGNOSIS — I10 Essential (primary) hypertension: Secondary | ICD-10-CM

## 2022-05-19 ENCOUNTER — Ambulatory Visit (INDEPENDENT_AMBULATORY_CARE_PROVIDER_SITE_OTHER): Payer: No Typology Code available for payment source | Admitting: Primary Care

## 2022-05-19 ENCOUNTER — Encounter: Payer: Self-pay | Admitting: Primary Care

## 2022-05-19 ENCOUNTER — Ambulatory Visit (INDEPENDENT_AMBULATORY_CARE_PROVIDER_SITE_OTHER)
Admission: RE | Admit: 2022-05-19 | Discharge: 2022-05-19 | Disposition: A | Discharge status: Home / Self Care | Payer: No Typology Code available for payment source | Source: Ambulatory Visit | Attending: Primary Care | Admitting: Primary Care

## 2022-05-19 VITALS — BP 140/82 | HR 100 | Temp 98.6°F | Ht <= 58 in | Wt 121.0 lb

## 2022-05-19 DIAGNOSIS — R053 Chronic cough: Secondary | ICD-10-CM

## 2022-05-19 DIAGNOSIS — I10 Essential (primary) hypertension: Secondary | ICD-10-CM

## 2022-05-19 DIAGNOSIS — Q231 Congenital insufficiency of aortic valve: Secondary | ICD-10-CM

## 2022-05-19 DIAGNOSIS — E785 Hyperlipidemia, unspecified: Secondary | ICD-10-CM | POA: Diagnosis not present

## 2022-05-19 DIAGNOSIS — R011 Cardiac murmur, unspecified: Secondary | ICD-10-CM

## 2022-05-19 DIAGNOSIS — F4323 Adjustment disorder with mixed anxiety and depressed mood: Secondary | ICD-10-CM

## 2022-05-19 DIAGNOSIS — J4520 Mild intermittent asthma, uncomplicated: Secondary | ICD-10-CM

## 2022-05-19 DIAGNOSIS — E538 Deficiency of other specified B group vitamins: Secondary | ICD-10-CM

## 2022-05-19 DIAGNOSIS — D509 Iron deficiency anemia, unspecified: Secondary | ICD-10-CM

## 2022-05-19 DIAGNOSIS — J449 Chronic obstructive pulmonary disease, unspecified: Secondary | ICD-10-CM

## 2022-05-19 DIAGNOSIS — R051 Acute cough: Secondary | ICD-10-CM | POA: Insufficient documentation

## 2022-05-19 HISTORY — DX: Chronic cough: R05.3

## 2022-05-19 LAB — LIPID PANEL
Cholesterol: 197 mg/dL (ref 0–200)
HDL: 43 mg/dL (ref >39.00)
LDL Cholesterol: 136 mg/dL — ABNORMAL HIGH (ref 0–99)
NonHDL: 153.54
Total CHOL/HDL Ratio: 5
Triglycerides: 87 mg/dL (ref 0.0–149.0)
VLDL: 17.4 mg/dL (ref 0.0–40.0)

## 2022-05-19 LAB — COMPREHENSIVE METABOLIC PANEL
ALT: 16 U/L (ref 0–35)
AST: 20 U/L (ref 0–37)
Albumin: 3.7 g/dL (ref 3.5–5.2)
Alkaline Phosphatase: 61 U/L (ref 39–117)
BUN: 15 mg/dL (ref 6–23)
CO2: 33 mEq/L — ABNORMAL HIGH (ref 19–32)
Calcium: 9.6 mg/dL (ref 8.4–10.5)
Chloride: 92 mEq/L — ABNORMAL LOW (ref 96–112)
Creatinine, Ser: 0.65 mg/dL (ref 0.40–1.20)
GFR: 94.54 mL/min (ref >60.00)
Glucose, Bld: 94 mg/dL (ref 70–99)
Potassium: 3.7 mEq/L (ref 3.5–5.1)
Sodium: 135 mEq/L (ref 135–145)
Total Bilirubin: 0.5 mg/dL (ref 0.2–1.2)
Total Protein: 7.3 g/dL (ref 6.0–8.3)

## 2022-05-19 LAB — IBC + FERRITIN
Ferritin: 422.7 ng/mL — ABNORMAL HIGH (ref 10.0–291.0)
Iron: 35 ug/dL — ABNORMAL LOW (ref 42–145)
Saturation Ratios: 16.7 % — ABNORMAL LOW (ref 20.0–50.0)
TIBC: 210 ug/dL — ABNORMAL LOW (ref 250.0–450.0)
Transferrin: 150 mg/dL — ABNORMAL LOW (ref 212.0–360.0)

## 2022-05-19 LAB — CBC
HCT: 40.7 % (ref 36.0–46.0)
Hemoglobin: 13.8 g/dL (ref 12.0–15.0)
MCHC: 33.9 g/dL (ref 30.0–36.0)
MCV: 93.4 fl (ref 78.0–100.0)
Platelets: 323 10*3/uL (ref 150.0–400.0)
RBC: 4.35 Mil/uL (ref 3.87–5.11)
RDW: 13.9 % (ref 11.5–15.5)
WBC: 12.9 10*3/uL — ABNORMAL HIGH (ref 4.0–10.5)

## 2022-05-19 LAB — VITAMIN B12: Vitamin B-12: >1504 pg/mL — ABNORMAL HIGH (ref 211–911)

## 2022-05-19 LAB — HEMOGLOBIN A1C: Hgb A1c MFr Bld: 5.8 % (ref 4.6–6.5)

## 2022-05-19 NOTE — Progress Notes (Signed)
Subjective:    Patient ID: Michelle Barnett, female    DOB: May 05, 1961, 61 y.o.   MRN: [id number]  HPI  Michelle GuppyCathy F Toelle is a very pleasant 61 y.o. female with a history of hypertension, COPD, iron deficiency anemia, vitamin B12 deficiency, anxiety and depression who presents today for follow-up of chronic conditions.  1) Hypertension: Currently managed on lisinopril-hydrochlorothiazide 10-12.5 mg once daily.  BP Readings from Last 3 Encounters:  05/19/22 140/82  05/21/21 130/82  12/18/19 120/65     2) Hyperlipidemia: Currently managed on atorvastatin 20 mg daily.  She is due for repeat lipid panel today.  3) COPD/Asthma: Currently managed on albuterol inhaler as needed, Flovent HFA 110 mcg BID. She endorses compliance to her Flovent inhaler BID. She was using her albuterol inhaler once weekly up until three weeks ago.   Symptoms including post nasal drip which began about 6 weeks ago. Over the last 3 weeks she's noticed increased symptoms. She's now experienced chest congestion. She denies fatigue, increased mucous production, nasal congestion, sinus pressure, fevers. She's been taking Benadryl 75 mg every four hours with temporary improvement.   4) Iron Deficiency Anemia/B12 Deficiency: Prior history. Currently managed on vitamin B12 1000 mcg four to five days weekly on average. She is managed on ferrous sulfate 325 mg daily. She denies constipation, vaginal bleeding, rectal bleeding.   5) Murmur: Chronic for years. No cardiology follow up in years. Last echocardiogram on file is from 2017. She was told years ago "they told me there was nothing to worry about". An echocardiogram was ordered in May of 2022, patient never followed through. Today she agrees to repeat echocardiogram.   She does notice exertional dyspnea with walking during humid hot weather and colder weather.    Review of Systems  Constitutional:  Negative for fatigue and fever.  HENT:  Positive for congestion and  postnasal drip. Negative for rhinorrhea, sinus pressure and sore throat.   Respiratory:  Positive for cough and shortness of breath.   Gastrointestinal:  Negative for constipation and diarrhea.  Neurological:  Positive for headaches. Negative for dizziness.  Hematological:  Negative for adenopathy.        Past Medical History:  Diagnosis Date   Asthma    Bicuspid aortic valve 09/20/2016   a. echo 09/19/16: EF 55-60%, GR1DD, possible bicuspid aortic valve without evidence of AS   Bronchitis    Coronary artery disease, non-occlusive    a. cath 09/21/16: ostLM to LM 20%, no evidence of aortic stenosis   Demand ischemia (HCC) 09/20/2016   Heart murmur    Hiatal hernia    History of blood transfusion    Hypertension    Ulcer     Social History   Socioeconomic History   Marital status: Single    Spouse name: Not on file   Number of children: Not on file   Years of education: Not on file   Highest education level: Not on file  Occupational History   Not on file  Tobacco Use   Smoking status: Never   Smokeless tobacco: Never  Substance and Sexual Activity   Alcohol use: No   Drug use: No   Sexual activity: Never  Other Topics Concern   Not on file  Social History Narrative   Not on file   Social Determinants of Health   Financial Resource Strain: Not on file  Food Insecurity: Not on file  Transportation Needs: Not on file  Physical Activity: Not on file  Stress: Not on file  Social Connections: Not on file  Intimate Partner Violence: Not on file    Past Surgical History:  Procedure Laterality Date   abdominal tumor     ABLATION     CARDIAC CATHETERIZATION N/A 09/21/2016   Procedure: Left Heart Cath and Coronary Angiography;  Surgeon: Iran Ouch, MD;  Location: [facility] INVASIVE CV LAB;  Service: Cardiovascular;  Laterality: N/A;   COLONOSCOPY N/A 08/14/2016   Procedure: COLONOSCOPY;  Surgeon: Sherrilyn Rist, MD;  Location: [facility] ENDOSCOPY;  Service: Endoscopy;   Laterality: N/A;   ESOPHAGOGASTRODUODENOSCOPY N/A 08/13/2016   Procedure: ESOPHAGOGASTRODUODENOSCOPY (EGD);  Surgeon: Sherrilyn Rist, MD;  Location: [facility] ENDOSCOPY;  Service: Gastroenterology;  Laterality: N/A;   TOOTH EXTRACTION      Family History  Problem Relation Age of Onset   Stroke Mother    Hypertension Mother    Hypertension Father    Heart disease Father    Hypertension Brother    Breast cancer Maternal Aunt     Allergies  Allergen Reactions   Aspirin Anaphylaxis   Dairy Aid [Tilactase] Swelling and Other (See Comments)    Any dairy products   Benadryl [Diphenhydramine] Palpitations   Penicillins Other (See Comments)    Reaction: Unknown    Current Outpatient Medications on File Prior to Visit  Medication Sig Dispense Refill   albuterol (VENTOLIN HFA) 108 (90 Base) MCG/ACT inhaler INHALE 2 PUFFS BY MOUTH EVERY 6 HOURS AS NEEDED FOR WHEEZING FOR SHORTNESS OF BREATH 9 g 0   Ascorbic Acid (VITAMIN C) 1000 MG tablet Take 1,000 mg by mouth 2 (two) times daily.      atorvastatin (LIPITOR) 20 MG tablet Take 1 tablet (20 mg total) by mouth daily. for cholesterol. Office visit required for further refills. 30 tablet 0   ferrous sulfate 325 (65 FE) MG tablet Take 650 mg by mouth 2 (two) times daily.      fluticasone (FLOVENT HFA) 110 MCG/ACT inhaler Inhale 1 puff into the lungs 2 (two) times daily. Use everyday for maintenance of breathing. 12 g 2   lisinopril-hydrochlorothiazide (ZESTORETIC) 10-12.5 MG tablet Take 1 tablet by mouth daily. for blood pressure. Office visit required for further refills. 30 tablet 0   magnesium oxide (MAG-OX) 400 MG tablet Take 400 mg by mouth daily.     Multiple Vitamins-Calcium (ONE-A-DAY WOMENS PO) Take 1 tablet by mouth daily.     Omega-3 Fatty Acids (FISH OIL) 1200 MG CAPS Take 1 capsule by mouth daily.      vitamin B-12 (CYANOCOBALAMIN) 1000 MCG tablet Take 1,000 mcg by mouth daily.     potassium chloride SA (KLOR-CON) 20 MEQ tablet Take 1  tablet (20 mEq total) by mouth 2 (two) times daily. For low potassium. (Patient not taking: Reported on 05/19/2022) 10 tablet 0   No current facility-administered medications on file prior to visit.    BP 140/82   Pulse 100   Temp 98.6 F (37 C) (Oral)   Ht 4\' 9"  (1.448 m)   Wt 121 lb (54.9 kg)   SpO2 90%   BMI 26.18 kg/m  Objective:   Physical Exam Constitutional:      General: She is not in acute distress.    Appearance: She is not ill-appearing.  Cardiovascular:     Rate and Rhythm: Normal rate and regular rhythm.  Pulmonary:     Effort: Pulmonary effort is normal.     Breath sounds: Normal breath sounds.  Comments: Dry cough noted throughout exam Musculoskeletal:     Cervical back: Neck supple.  Skin:    General: Skin is warm and dry.  Neurological:     Mental Status: She is alert and oriented to person, place, and time.  Psychiatric:        Mood and Affect: Mood normal.          Assessment & Plan:

## 2022-05-19 NOTE — Assessment & Plan Note (Signed)
Denies concerns today. °Continue to monitor. °

## 2022-05-19 NOTE — Assessment & Plan Note (Signed)
Overall controlled. Continue Flovent 110 mcg BID, albuterol PRN

## 2022-05-19 NOTE — Assessment & Plan Note (Signed)
Continue ferrous sulfate 325 mg daily. Repeat IBC and CBC pending.

## 2022-05-19 NOTE — Assessment & Plan Note (Signed)
Overall controlled.  Continue Flovent 110 mcg BID and albuterol inhaler as needed.  Discussed to stop Benadryl. Start Zyrtec 10 mg HS. Consider addition of Singulair 10 mg HS.

## 2022-05-19 NOTE — Assessment & Plan Note (Signed)
Continue atorvastatin 20 mg daily. Repeat lipid panel pending. 

## 2022-05-19 NOTE — Assessment & Plan Note (Signed)
No recent follow up on file. Very loud murmur on exam today. Reviewed echocardiogram from 2017. Repeat echocardiogram ordered today.

## 2022-05-19 NOTE — Patient Instructions (Signed)
Stop by the lab and xray prior to leaving today. I will notify you of your results once received.   You will be contacted regarding your echocardiogram.  Please let us know if you have not been contacted within two weeks.   Stop taking Benadryl. Start taking cetrizine (Zyrtec) 10 mg daily for allergies and drainage.  It was a pleasure to see you today!

## 2022-05-19 NOTE — Assessment & Plan Note (Signed)
No recent follow up on file. Very loud murmur on exam today. Reviewed echocardiogram from 2017. Repeat echocardiogram ordered today. 

## 2022-05-19 NOTE — Assessment & Plan Note (Signed)
Controlled.  Continue lisinopril-HCTZ 10-12.5 mg daily. CMP pending.

## 2022-05-19 NOTE — Assessment & Plan Note (Signed)
Likely allergies. Treat with Zyrtec 10 mg, discontinue Benadryl.  She doesn't appear acutely ill today.  Checking chest xray. Consider removal of ACE-I.  Await results.

## 2022-05-20 ENCOUNTER — Other Ambulatory Visit: Payer: Self-pay | Admitting: Primary Care

## 2022-05-20 DIAGNOSIS — J189 Pneumonia, unspecified organism: Secondary | ICD-10-CM

## 2022-05-20 MED ORDER — AZITHROMYCIN 250 MG PO TABS: ORAL_TABLET | ORAL | 0 refills | Status: DC

## 2022-05-21 ENCOUNTER — Other Ambulatory Visit: Payer: Self-pay | Admitting: Primary Care

## 2022-05-21 DIAGNOSIS — E785 Hyperlipidemia, unspecified: Secondary | ICD-10-CM

## 2022-05-21 MED ORDER — ATORVASTATIN CALCIUM 40 MG PO TABS: 40.0000 mg | ORAL_TABLET | Freq: Every day | ORAL | 3 refills | Status: DC

## 2022-05-27 ENCOUNTER — Ambulatory Visit (INDEPENDENT_AMBULATORY_CARE_PROVIDER_SITE_OTHER): Payer: No Typology Code available for payment source

## 2022-05-27 DIAGNOSIS — R011 Cardiac murmur, unspecified: Secondary | ICD-10-CM

## 2022-05-27 DIAGNOSIS — Q231 Congenital insufficiency of aortic valve: Secondary | ICD-10-CM | POA: Diagnosis not present

## 2022-05-27 LAB — ECHOCARDIOGRAM COMPLETE
AR max vel: 2.34 cm2
AV Area VTI: 2.24 cm2
AV Area mean vel: 2.07 cm2
AV Mean grad: 7 mmHg
AV Peak grad: 11.7 mmHg
Ao pk vel: 1.71 m/s
Area-P 1/2: 4.26 cm2
S' Lateral: 2.4 cm

## 2022-06-02 ENCOUNTER — Other Ambulatory Visit: Payer: Self-pay | Admitting: Primary Care

## 2022-06-02 DIAGNOSIS — I10 Essential (primary) hypertension: Secondary | ICD-10-CM

## 2022-07-17 NOTE — Telephone Encounter (Signed)
This encounter was created in error - please disregard.

## 2022-08-24 ENCOUNTER — Other Ambulatory Visit: Payer: No Typology Code available for payment source

## 2022-11-16 ENCOUNTER — Other Ambulatory Visit: Payer: Self-pay | Admitting: Primary Care

## 2022-11-16 DIAGNOSIS — J4530 Mild persistent asthma, uncomplicated: Secondary | ICD-10-CM

## 2022-11-17 ENCOUNTER — Other Ambulatory Visit (HOSPITAL_COMMUNITY): Payer: Self-pay

## 2022-11-17 ENCOUNTER — Telehealth: Payer: Self-pay

## 2022-11-17 DIAGNOSIS — J4520 Mild intermittent asthma, uncomplicated: Secondary | ICD-10-CM

## 2022-11-17 MED ORDER — BECLOMETHASONE DIPROP HFA 80 MCG/ACT IN AERB: 2.0000 | INHALATION_SPRAY | Freq: Two times a day (BID) | RESPIRATORY_TRACT | 5 refills | Status: DC

## 2022-11-17 NOTE — Telephone Encounter (Signed)
Pharmacy Patient Advocate Encounter  Received notification from [facility] that the request for prior authorization for Flovent HFA 110 mcg/act has been denied due to .    Specialty Pharmacy Patient Advocate Fax:  [phone]

## 2022-11-17 NOTE — Telephone Encounter (Signed)
Unable to reach patient. Left voicemail to return call to our office.   

## 2022-11-17 NOTE — Telephone Encounter (Signed)
Pt called returning Kellie's missed call. Pt stated she did not pick up inhaler from [facility]. Pt stated she has [employer] as of 07/2022 & pt mentioned what would be the price for beclomethasone (Qvar)? Call back # [phone].

## 2022-11-17 NOTE — Telephone Encounter (Signed)
Please call patient:  Let her know that the Flovent inhaler for asthma was rejected by [facility]. I'm somewhat confused.   I sent her inhaler to [facility]. Did she pick it up at [facility]? I have no insurance information on file for her. Does she have insurance?  I can send a different inhaler that may be better in price called beclomethasone (Qvar), inhale 2 puffs BID, everyday.  Let me know what she says.

## 2022-11-17 NOTE — Telephone Encounter (Signed)
Patient Advocate Encounter   Received notification from [facility] that prior authorization for Flovent HFA 110 MCG/ACT is required.  PREFERRED PRODUCTS: Marti Sleigh HFA, QVAR REDIHALER   PA submitted on 11/17/2022 Key [id number] Status is pending       Kristie Cowman, CPHT Pharmacy Patient Advocate Specialist [facility] Patient Advocate Team Direct Number: [phone] Fax: [phone]

## 2022-11-17 NOTE — Telephone Encounter (Signed)
[facility] pharmacy they verified that the Flovent inhaler would need a PA, but the Qvar inhaler is covered by her insurance.  Called patient and notified her of this, she agrees for [place] to send the Qvar inhaler [facility] and she will let us know if it is too expensive.

## 2022-11-30 ENCOUNTER — Telehealth: Payer: Self-pay | Admitting: Primary Care

## 2022-11-30 NOTE — Telephone Encounter (Signed)
Spoke with patient, she does not like how large the Qvar inhaler is. She says it is very different than the one she has used before. She wants to take it back and see if the pharmacy will give her a refund. She has not used it, but has opened the box. She is going to ask the pharmacy if there is a comparable inhaler that is smaller that she would be comfortable using.

## 2022-11-30 NOTE — Telephone Encounter (Signed)
Patient called in and had questions regarding her inhaler. She stated that she is off today and available all day. She can be reached at [phone]. Thank you!

## 2022-12-11 NOTE — Telephone Encounter (Signed)
Patient called and stated she had question bout an inhaler. Call back number [phone].

## 2022-12-11 NOTE — Telephone Encounter (Signed)
Called and spoke with patient, she wants the inhaler in the grey box to be reordered because she likes that one and not the one that was ordered most recently. She states that one feels like its spraying forcefully in the back of her throat. She tried to take that one back to the pharmacy and they would not accept the return. She cannot remember the names of the inhalers just what they look like. I advised I needed to know the specific names so we can ensure we order the correct one.  She is not at home currently, she will call up 12/18 or 12/19 with an update for the exact names of the inhalers she is referring to.

## 2022-12-11 NOTE — Telephone Encounter (Signed)
Attempted to call patient. Phone number listed as callback number was unable to connect.

## 2022-12-15 NOTE — Telephone Encounter (Addendum)
Called and spoke to [facility] pharmacy verified that the patient picked up   Albuterol inhaler on 11/21/2021- to be used PRN Flovent inhaler on [facility] pharmacy did not show that a PA was completed for this recently  Qvar inhaler on 11/18/2022- to be used BID  Need to get insurance information updated for patient in her chart. Stated as of 07/2022 she has [employer].  Patient does not like the Qvar inhaler she recently got, she was switched to this one from Flovent because we were informed that a PA was denied for the Flovent inhaler; however, [facility] does not have on file that a PA was recently done for the Flovent inhaler.

## 2022-12-15 NOTE — Addendum Note (Signed)
Addended by: Doreene Nest on: 12/15/2022 03:50 PM   Modules accepted: Orders

## 2022-12-15 NOTE — Telephone Encounter (Signed)
Spoke with pharmacy, they do not have the correct insurance on file for patient. Pharmacy tech stated patient would need to come and show the card to verify coverage.  Called patient and notified of this, she stated she will [facility] and show them her insurance card.

## 2022-12-15 NOTE — Telephone Encounter (Signed)
Patient called and stated she had more questions about her inhaler.

## 2022-12-15 NOTE — Telephone Encounter (Signed)
Spoke to patient on the phone and advised that the insurance had been updated in her chart.

## 2022-12-15 NOTE — Telephone Encounter (Signed)
It looks like her pharmacy has the [employer] on file.  She just needs to request the Flovent inhaler refill from [facility].  You may want to contact [facility] for her.  I will discontinue the Qvar inhaler from her list.

## 2022-12-15 NOTE — Telephone Encounter (Signed)
Patient called in to let Jae Dire know that the inhaler name was flovent fla 110 mg

## 2022-12-15 NOTE — Telephone Encounter (Addendum)
Spoke with patient, I walked her through sending a MyChart message with the insurance cards attached to get her chart updated.  She would like the Flovent inhaler sent back in for her instead of the Qvar, she does not like this one as its too forceful when she uses it. [employer] has been added to patients chart.

## 2023-05-21 ENCOUNTER — Ambulatory Visit (INDEPENDENT_AMBULATORY_CARE_PROVIDER_SITE_OTHER): Payer: No Typology Code available for payment source | Admitting: Primary Care

## 2023-05-21 ENCOUNTER — Encounter: Payer: Self-pay | Admitting: Primary Care

## 2023-05-21 VITALS — BP 172/96 | HR 76 | Temp 98.4°F | Ht <= 58 in | Wt 127.0 lb

## 2023-05-21 DIAGNOSIS — R011 Cardiac murmur, unspecified: Secondary | ICD-10-CM

## 2023-05-21 DIAGNOSIS — Q2381 Bicuspid aortic valve: Secondary | ICD-10-CM

## 2023-05-21 DIAGNOSIS — J449 Chronic obstructive pulmonary disease, unspecified: Secondary | ICD-10-CM

## 2023-05-21 DIAGNOSIS — Z0000 Encounter for general adult medical examination without abnormal findings: Secondary | ICD-10-CM

## 2023-05-21 DIAGNOSIS — Q231 Congenital insufficiency of aortic valve: Secondary | ICD-10-CM | POA: Diagnosis not present

## 2023-05-21 DIAGNOSIS — F4323 Adjustment disorder with mixed anxiety and depressed mood: Secondary | ICD-10-CM

## 2023-05-21 DIAGNOSIS — E785 Hyperlipidemia, unspecified: Secondary | ICD-10-CM

## 2023-05-21 DIAGNOSIS — I10 Essential (primary) hypertension: Secondary | ICD-10-CM

## 2023-05-21 DIAGNOSIS — D509 Iron deficiency anemia, unspecified: Secondary | ICD-10-CM

## 2023-05-21 DIAGNOSIS — Z1231 Encounter for screening mammogram for malignant neoplasm of breast: Secondary | ICD-10-CM

## 2023-05-21 DIAGNOSIS — K219 Gastro-esophageal reflux disease without esophagitis: Secondary | ICD-10-CM

## 2023-05-21 DIAGNOSIS — J4520 Mild intermittent asthma, uncomplicated: Secondary | ICD-10-CM

## 2023-05-21 DIAGNOSIS — Z23 Encounter for immunization: Secondary | ICD-10-CM | POA: Diagnosis not present

## 2023-05-21 LAB — IBC + FERRITIN
Ferritin: 45.3 ng/mL (ref 10.0–291.0)
Iron: 69 ug/dL (ref 42–145)
Saturation Ratios: 21.4 % (ref 20.0–50.0)
TIBC: 322 ug/dL (ref 250.0–450.0)
Transferrin: 230 mg/dL (ref 212.0–360.0)

## 2023-05-21 LAB — COMPREHENSIVE METABOLIC PANEL
ALT: 27 U/L (ref 0–35)
AST: 25 U/L (ref 0–37)
Albumin: 4 g/dL (ref 3.5–5.2)
Alkaline Phosphatase: 47 U/L (ref 39–117)
BUN: 13 mg/dL (ref 6–23)
CO2: 38 mEq/L — ABNORMAL HIGH (ref 19–32)
Calcium: 9.2 mg/dL (ref 8.4–10.5)
Chloride: 95 mEq/L — ABNORMAL LOW (ref 96–112)
Creatinine, Ser: 0.57 mg/dL (ref 0.40–1.20)
GFR: 96.9 mL/min (ref >60.00)
Glucose, Bld: 87 mg/dL (ref 70–99)
Potassium: 4 mEq/L (ref 3.5–5.1)
Sodium: 140 mEq/L (ref 135–145)
Total Bilirubin: 0.5 mg/dL (ref 0.2–1.2)
Total Protein: 6.3 g/dL (ref 6.0–8.3)

## 2023-05-21 LAB — LIPID PANEL
Cholesterol: 212 mg/dL — ABNORMAL HIGH (ref 0–200)
HDL: 89.5 mg/dL (ref >39.00)
LDL Cholesterol: 112 mg/dL — ABNORMAL HIGH (ref 0–99)
NonHDL: 122.74
Total CHOL/HDL Ratio: 2
Triglycerides: 54 mg/dL (ref 0.0–149.0)
VLDL: 10.8 mg/dL (ref 0.0–40.0)

## 2023-05-21 LAB — CBC
HCT: 44.1 % (ref 36.0–46.0)
Hemoglobin: 14.2 g/dL (ref 12.0–15.0)
MCHC: 32.2 g/dL (ref 30.0–36.0)
MCV: 95.9 fl (ref 78.0–100.0)
Platelets: 183 10*3/uL (ref 150.0–400.0)
RBC: 4.6 Mil/uL (ref 3.87–5.11)
RDW: 14.7 % (ref 11.5–15.5)
WBC: 4.4 10*3/uL (ref 4.0–10.5)

## 2023-05-21 MED ORDER — ARNUITY ELLIPTA 100 MCG/ACT IN AEPB: 1.0000 | INHALATION_SPRAY | Freq: Every day | RESPIRATORY_TRACT | 3 refills | Status: DC

## 2023-05-21 MED ORDER — ALBUTEROL SULFATE HFA 108 (90 BASE) MCG/ACT IN AERS: INHALATION_SPRAY | RESPIRATORY_TRACT | 0 refills | Status: DC

## 2023-05-21 NOTE — Assessment & Plan Note (Signed)
Above goal today, also on recheck. I've asked her to start monitoring BP at home and return in 1 week for BP check.  Continue lisinopril-HCTZ 10-12.5 mg daily for now.

## 2023-05-21 NOTE — Progress Notes (Addendum)
Subjective:    Patient ID: Michelle Barnett, female    DOB: 03-20-1961, 62 y.o.   MRN: [id number]  HPI  Michelle Barnett is a very pleasant 62 y.o. female who presents today for complete physical and follow up of chronic conditions.  Immunizations: -Tetanus: Completed in 2022 -Shingles: Completed Shingrix x 1 vaccine -Pneumonia: Completed in 2017  Diet: Fair diet.  Exercise: No regular exercise.  Eye exam: Completed years ago  Dental exam: Completed years ago    Pap Smear: 2022 Mammogram: 2017  Colonoscopy: Completed in 2017, due 2027  BP Readings from Last 3 Encounters:  05/21/23 (!) 172/96  05/19/22 140/82  05/21/21 130/82         Review of Systems  Constitutional:  Negative for unexpected weight change.  HENT:  Negative for rhinorrhea.   Respiratory:  Negative for cough and shortness of breath.   Cardiovascular:  Negative for chest pain.  Gastrointestinal:  Negative for constipation and diarrhea.  Genitourinary:  Negative for difficulty urinating.  Musculoskeletal:  Negative for arthralgias and myalgias.  Skin:  Negative for rash.  Allergic/Immunologic: Negative for environmental allergies.  Neurological:  Negative for dizziness, numbness and headaches.  Psychiatric/Behavioral:  The patient is not nervous/anxious.          Past Medical History:  Diagnosis Date   Asthma    Bicuspid aortic valve 09/20/2016   a. echo 09/19/16: EF 55-60%, GR1DD, possible bicuspid aortic valve without evidence of AS   Bronchitis    Coronary artery disease, non-occlusive    a. cath 09/21/16: ostLM to LM 20%, no evidence of aortic stenosis   Demand ischemia 09/20/2016   Heart murmur    Hiatal hernia    History of blood transfusion    Hypertension    Persistent cough for 3 weeks or longer 05/19/2022   Ulcer     Social History   Socioeconomic History   Marital status: Single    Spouse name: Not on file   Number of children: Not on file   Years of education: Not on  file   Highest education level: Not on file  Occupational History   Not on file  Tobacco Use   Smoking status: Never   Smokeless tobacco: Never  Substance and Sexual Activity   Alcohol use: No   Drug use: No   Sexual activity: Never  Other Topics Concern   Not on file  Social History Narrative   Not on file   Social Determinants of Health   Financial Resource Strain: Not on file  Food Insecurity: Not on file  Transportation Needs: Not on file  Physical Activity: Not on file  Stress: Not on file  Social Connections: Not on file  Intimate Partner Violence: Not on file    Past Surgical History:  Procedure Laterality Date   abdominal tumor     ABLATION     CARDIAC CATHETERIZATION N/A 09/21/2016   Procedure: Left Heart Cath and Coronary Angiography;  Surgeon: Iran Ouch, MD;  Location: [facility] INVASIVE CV LAB;  Service: Cardiovascular;  Laterality: N/A;   COLONOSCOPY N/A 08/14/2016   Procedure: COLONOSCOPY;  Surgeon: Sherrilyn Rist, MD;  Location: [facility] ENDOSCOPY;  Service: Endoscopy;  Laterality: N/A;   ESOPHAGOGASTRODUODENOSCOPY N/A 08/13/2016   Procedure: ESOPHAGOGASTRODUODENOSCOPY (EGD);  Surgeon: Sherrilyn Rist, MD;  Location: [facility] ENDOSCOPY;  Service: Gastroenterology;  Laterality: N/A;   TOOTH EXTRACTION      Family History  Problem Relation Age of Onset  Stroke Mother    Hypertension Mother    Hypertension Father    Heart disease Father    Hypertension Brother    Breast cancer Maternal Aunt     Allergies  Allergen Reactions   Aspirin Anaphylaxis   Dairy Aid [Tilactase] Swelling and Other (See Comments)    Any dairy products   Benadryl [Diphenhydramine] Palpitations   Penicillins Other (See Comments)    Reaction: Unknown    Current Outpatient Medications on File Prior to Visit  Medication Sig Dispense Refill   Ascorbic Acid (VITAMIN C) 1000 MG tablet Take 1,000 mg by mouth 2 (two) times daily.      atorvastatin (LIPITOR) 40 MG tablet Take 1 tablet  (40 mg total) by mouth daily. For cholesterol 90 tablet 3   lisinopril-hydrochlorothiazide (ZESTORETIC) 10-12.5 MG tablet Take 1 tablet by mouth once daily for blood pressure 90 tablet 3   magnesium oxide (MAG-OX) 400 MG tablet Take 400 mg by mouth daily.     Multiple Vitamins-Calcium (ONE-A-DAY WOMENS PO) Take 1 tablet by mouth daily.     Omega-3 Fatty Acids (FISH OIL) 1200 MG CAPS Take 1 capsule by mouth daily.      ferrous sulfate 325 (65 FE) MG tablet Take 650 mg by mouth 2 (two) times daily.  (Patient not taking: Reported on 05/21/2023)     vitamin B-12 (CYANOCOBALAMIN) 1000 MCG tablet Take 1,000 mcg by mouth daily. (Patient not taking: Reported on 05/21/2023)     No current facility-administered medications on file prior to visit.    BP (!) 172/96   Pulse 76   Temp 98.4 F (36.9 C) (Temporal)   Ht 4\' 9"  (1.448 m)   Wt 127 lb (57.6 kg)   SpO2 91%   BMI 27.48 kg/m  Objective:   Physical Exam HENT:     Right Ear: Tympanic membrane and ear canal normal.     Left Ear: Tympanic membrane and ear canal normal.     Nose: Nose normal.  Eyes:     Conjunctiva/sclera: Conjunctivae normal.     Pupils: Pupils are equal, round, and reactive to light.  Neck:     Thyroid: No thyromegaly.  Cardiovascular:     Rate and Rhythm: Normal rate and regular rhythm.     Heart sounds: Murmur heard.  Pulmonary:     Effort: Pulmonary effort is normal.     Breath sounds: Normal breath sounds. No rales.  Abdominal:     General: Bowel sounds are normal.     Palpations: Abdomen is soft.     Tenderness: There is no abdominal tenderness.  Musculoskeletal:        General: Normal range of motion.     Cervical back: Neck supple.  Lymphadenopathy:     Cervical: No cervical adenopathy.  Skin:    General: Skin is warm and dry.     Findings: No rash.  Neurological:     Mental Status: She is alert and oriented to person, place, and time.     Cranial Nerves: No cranial nerve deficit.     Deep Tendon  Reflexes: Reflexes are normal and symmetric.  Psychiatric:        Mood and Affect: Mood normal.           Assessment & Plan:  Preventative health care Assessment & Plan: Due for second Shingrix.  Pap smear UTD. Mammogram overdue, orders placed. Colonoscopy UTD, due 2027  Discussed the importance of a healthy diet and regular exercise in order  for weight loss, and to reduce the risk of further co-morbidity.  Exam stable. Labs pending.  Follow up in 1 year for repeat physical.    Screening mammogram for breast cancer -     3D Screening Mammogram, Left and Right; Future  Bicuspid aortic valve Assessment & Plan: Stable. Reviewed echocardiogram from May 2023. Continue to monitor.   COPD, severity to be determined [facility]) Assessment & Plan: Overall controlled.  Discussed importance of regular use of ICS.  Will switch to Arnuity Ellipta 100 mcg daily for insurance coverage. Refill provided for albuterol inhaler.  Orders: -     Arnuity Ellipta; Inhale 1 puff into the lungs daily.  Dispense: 90 each; Refill: 3  Mild intermittent asthma without complication Assessment & Plan: Overall controlled.  Discussed importance of regular use of ICS.  Will switch to Arnuity Ellipta 100 mcg daily for insurance coverage. Refill provided for albuterol inhaler.  Orders: -     Arnuity Ellipta; Inhale 1 puff into the lungs daily.  Dispense: 90 each; Refill: 3 -     Albuterol Sulfate HFA; INHALE 2 PUFFS BY MOUTH EVERY 6 HOURS AS NEEDED FOR WHEEZING FOR SHORTNESS OF BREATH  Dispense: 8 g; Refill: 0  Gastroesophageal reflux disease, unspecified whether esophagitis present Assessment & Plan: Controlled.  Continue to monitor.    Adjustment disorder with mixed anxiety and depressed mood Assessment & Plan: No concerns today. Continue to monitor   Iron deficiency anemia, unspecified iron deficiency anemia type Assessment & Plan: Non compliant to ferrous sulfate. Repeat iron  studies pending.  Orders: -     CBC -     IBC + Ferritin  Hyperlipidemia, unspecified hyperlipidemia type Assessment & Plan: Repeat lipid panel pending.  Discussed the importance of a healthy diet and regular exercise in order for weight loss, and to reduce the risk of further co-morbidity. Continue atorvastatin 40 mg daily.  Orders: -     Lipid panel -     CBC -     Comprehensive metabolic panel  Murmur Assessment & Plan: Present on exam. Reviewed echocardiogram from 2023 which does not show significant changes since 2017. Continue to monitor.   Essential hypertension Assessment & Plan: Above goal today, also on recheck. I've asked her to start monitoring BP at home and return in 1 week for BP check.  Continue lisinopril-HCTZ 10-12.5 mg daily for now.         Doreene Nest, NP

## 2023-05-21 NOTE — Assessment & Plan Note (Signed)
Overall controlled.  Discussed importance of regular use of ICS.  Will switch to Arnuity Ellipta 100 mcg daily for insurance coverage. Refill provided for albuterol inhaler. 

## 2023-05-21 NOTE — Assessment & Plan Note (Signed)
No concerns today. Continue to monitor. 

## 2023-05-21 NOTE — Assessment & Plan Note (Signed)
Overall controlled.  Discussed importance of regular use of ICS.  Will switch to Arnuity Ellipta 100 mcg daily for insurance coverage. Refill provided for albuterol inhaler.

## 2023-05-21 NOTE — Patient Instructions (Addendum)
Stop by the lab prior to leaving today. I will notify you of your results once received.   Use the Arnuity Ellipta inhaler once daily, everyday for asthma control.  Use the albuterol inhaler (rescue inhaler) only if needed for shortness of breath.   [facility] to [facility] your [facility].   Start monitoring your blood pressure daily, around the same time of day, for the next 2-3 weeks.  Ensure that you have rested for 30 minutes prior to checking your blood pressure.   Record your readings and bring them to your appointment.  Please schedule a follow up visit to meet back with me in 1 week for blood pressure check.   It was a pleasure to see you today!

## 2023-05-21 NOTE — Assessment & Plan Note (Signed)
Controlled.  Continue to monitor.  

## 2023-05-21 NOTE — Assessment & Plan Note (Signed)
Non compliant to ferrous sulfate. Repeat iron studies pending.

## 2023-05-21 NOTE — Assessment & Plan Note (Signed)
Repeat lipid panel pending.  Discussed the importance of a healthy diet and regular exercise in order for weight loss, and to reduce the risk of further co-morbidity. Continue atorvastatin 40 mg daily.  

## 2023-05-21 NOTE — Assessment & Plan Note (Signed)
Due for second Shingrix.  Pap smear UTD. Mammogram overdue, orders placed. Colonoscopy UTD, due 2027  Discussed the importance of a healthy diet and regular exercise in order for weight loss, and to reduce the risk of further co-morbidity.  Exam stable. Labs pending.  Follow up in 1 year for repeat physical.

## 2023-05-21 NOTE — Assessment & Plan Note (Signed)
Present on exam. Reviewed echocardiogram from 2023 which does not show significant changes since 2017. Continue to monitor.

## 2023-05-21 NOTE — Assessment & Plan Note (Signed)
Stable. Reviewed echocardiogram from May 2023. Continue to monitor.

## 2023-05-25 ENCOUNTER — Ambulatory Visit: Payer: No Typology Code available for payment source

## 2023-05-27 ENCOUNTER — Encounter: Payer: Self-pay | Admitting: Primary Care

## 2023-05-27 ENCOUNTER — Ambulatory Visit (INDEPENDENT_AMBULATORY_CARE_PROVIDER_SITE_OTHER): Payer: No Typology Code available for payment source | Admitting: Primary Care

## 2023-05-27 ENCOUNTER — Telehealth: Payer: Self-pay | Admitting: Primary Care

## 2023-05-27 VITALS — BP 152/82 | HR 54 | Temp 98.4°F | Ht <= 58 in | Wt 122.0 lb

## 2023-05-27 DIAGNOSIS — R011 Cardiac murmur, unspecified: Secondary | ICD-10-CM

## 2023-05-27 DIAGNOSIS — I10 Essential (primary) hypertension: Secondary | ICD-10-CM | POA: Diagnosis not present

## 2023-05-27 MED ORDER — AMLODIPINE BESYLATE 5 MG PO TABS
5.0000 mg | ORAL_TABLET | Freq: Every day | ORAL | 0 refills | Status: DC
Start: 2023-05-27 — End: 2023-08-23

## 2023-05-27 NOTE — Assessment & Plan Note (Addendum)
Above goal today, also on recheck. Home readings are above goal.  She will lose insurance beginning tomorrow so she cannot return for follow up until September 2024.   Continue lisinopril-HCTZ 10-12.5 mg daily. Add amlodipine 5 mg daily since she cannot return for repeat BMP.  She will continue to monitor BP at home. She will call us with BP readings in 2 weeks.

## 2023-05-27 NOTE — Telephone Encounter (Signed)
Error

## 2023-05-27 NOTE — Patient Instructions (Signed)
Start taking amlodipine 5 mg every evening for blood pressure.  Continue taking lisinopril-hydrochlorothiazide for blood pressure.  Please call me with blood pressure readings in 2 weeks.  Please schedule a follow-up visit for soon as you can.  It was a pleasure to see you today!

## 2023-05-27 NOTE — Assessment & Plan Note (Signed)
Present on exam today.  Abdominal bruit also noted on exam.  Will review prior echocardiograms and imaging.  She may need an abdominal ultrasound to evaluate for AAA. Will contact patient.

## 2023-05-27 NOTE — Progress Notes (Signed)
Subjective:    Patient ID: Michelle Barnett, female    DOB: 1961/11/09, 62 y.o.   MRN: [id number]  HPI  GENAVIEVE Barnett is a very pleasant 62 y.o. female with a history of hypertension, COPD, iron deficiency anemia, hyperlipidemia, asthma who presents today for follow-up of hypertension.  She was last evaluated 6 days ago for her annual physical.  During this visit her blood pressure was noted to be hypertensive at 172/96 despite compliance to lisinopril-HCTZ 10-12.5 mg.  One year prior her blood pressure was 140/82.  Given this unexpected high reading, even on recheck, she was advised to start monitoring blood pressure and follow-up today for recheck.  Since her last visit she continues to remain compliant to lisinopril-hydrochlorothiazide 10-12.5 mg daily. She is checking her BP at home which is running 140's-170's/70's-80's.   She denies headaches, dizziness. She has noticed some blurred vision.   BP Readings from Last 3 Encounters:  05/27/23 (!) 152/82  05/21/23 (!) 172/96  05/19/22 140/82      Review of Systems  Eyes:  Positive for visual disturbance.  Respiratory:  Negative for shortness of breath.   Cardiovascular:  Negative for chest pain.  Neurological:  Negative for dizziness and headaches.         Past Medical History:  Diagnosis Date   Asthma    Bicuspid aortic valve 09/20/2016   a. echo 09/19/16: EF 55-60%, GR1DD, possible bicuspid aortic valve without evidence of AS   Bronchitis    Coronary artery disease, non-occlusive    a. cath 09/21/16: ostLM to LM 20%, no evidence of aortic stenosis   Demand ischemia 09/20/2016   Heart murmur    Hiatal hernia    History of blood transfusion    Hypertension    Persistent cough for 3 weeks or longer 05/19/2022   Ulcer     Social History   Socioeconomic History   Marital status: Single    Spouse name: Not on file   Number of children: Not on file   Years of education: Not on file   Highest education level: Not  on file  Occupational History   Not on file  Tobacco Use   Smoking status: Never   Smokeless tobacco: Never  Substance and Sexual Activity   Alcohol use: No   Drug use: No   Sexual activity: Never  Other Topics Concern   Not on file  Social History Narrative   Not on file   Social Determinants of Health   Financial Resource Strain: Not on file  Food Insecurity: Not on file  Transportation Needs: Not on file  Physical Activity: Not on file  Stress: Not on file  Social Connections: Not on file  Intimate Partner Violence: Not on file    Past Surgical History:  Procedure Laterality Date   abdominal tumor     ABLATION     CARDIAC CATHETERIZATION N/A 09/21/2016   Procedure: Left Heart Cath and Coronary Angiography;  Surgeon: Iran Ouch, MD;  Location: [facility] INVASIVE CV LAB;  Service: Cardiovascular;  Laterality: N/A;   COLONOSCOPY N/A 08/14/2016   Procedure: COLONOSCOPY;  Surgeon: Sherrilyn Rist, MD;  Location: [facility] ENDOSCOPY;  Service: Endoscopy;  Laterality: N/A;   ESOPHAGOGASTRODUODENOSCOPY N/A 08/13/2016   Procedure: ESOPHAGOGASTRODUODENOSCOPY (EGD);  Surgeon: Sherrilyn Rist, MD;  Location: [facility] ENDOSCOPY;  Service: Gastroenterology;  Laterality: N/A;   TOOTH EXTRACTION      Family History  Problem Relation Age of Onset   Stroke  Mother    Hypertension Mother    Hypertension Father    Heart disease Father    Hypertension Brother    Breast cancer Maternal Aunt     Allergies  Allergen Reactions   Aspirin Anaphylaxis   Dairy Aid [Tilactase] Swelling and Other (See Comments)    Any dairy products   Benadryl [Diphenhydramine] Palpitations   Penicillins Other (See Comments)    Reaction: Unknown    Current Outpatient Medications on File Prior to Visit  Medication Sig Dispense Refill   albuterol (VENTOLIN HFA) 108 (90 Base) MCG/ACT inhaler INHALE 2 PUFFS BY MOUTH EVERY 6 HOURS AS NEEDED FOR WHEEZING FOR SHORTNESS OF BREATH 8 g 0   Ascorbic Acid (VITAMIN C) 1000  MG tablet Take 1,000 mg by mouth 2 (two) times daily.      atorvastatin (LIPITOR) 40 MG tablet Take 1 tablet (40 mg total) by mouth daily. For cholesterol 90 tablet 3   Fluticasone Furoate (ARNUITY ELLIPTA) 100 MCG/ACT AEPB Inhale 1 puff into the lungs daily. 90 each 3   lisinopril-hydrochlorothiazide (ZESTORETIC) 10-12.5 MG tablet Take 1 tablet by mouth once daily for blood pressure 90 tablet 3   magnesium oxide (MAG-OX) 400 MG tablet Take 400 mg by mouth daily.     Multiple Vitamins-Calcium (ONE-A-DAY WOMENS PO) Take 1 tablet by mouth daily.     Omega-3 Fatty Acids (FISH OIL) 1200 MG CAPS Take 1 capsule by mouth daily.      ferrous sulfate 325 (65 FE) MG tablet Take 650 mg by mouth 2 (two) times daily.  (Patient not taking: Reported on 05/21/2023)     vitamin B-12 (CYANOCOBALAMIN) 1000 MCG tablet Take 1,000 mcg by mouth daily. (Patient not taking: Reported on 05/21/2023)     No current facility-administered medications on file prior to visit.    BP (!) 152/82   Pulse (!) 54   Temp 98.4 F (36.9 C) (Temporal)   Ht 4\' 9"  (1.448 m)   Wt 122 lb (55.3 kg)   SpO2 92%   BMI 26.40 kg/m  Objective:   Physical Exam Cardiovascular:     Rate and Rhythm: Normal rate and regular rhythm.     Heart sounds: Murmur heard.     Comments: Abdominal bruit  Pulmonary:     Effort: Pulmonary effort is normal.     Breath sounds: Normal breath sounds.  Musculoskeletal:     Cervical back: Neck supple.  Skin:    General: Skin is warm and dry.           Assessment & Plan:  Essential hypertension Assessment & Plan: Above goal today, also on recheck. Home readings are above goal.  She will lose insurance beginning tomorrow so she cannot return for follow up until September 2024.   Continue lisinopril-HCTZ 10-12.5 mg daily. Add amlodipine 5 mg daily since she cannot return for repeat BMP.  She will continue to monitor BP at home. She will call us with BP readings in 2 weeks.   Orders: -      amLODIPine Besylate; Take 1 tablet (5 mg total) by mouth daily. for blood pressure.  Dispense: 90 tablet; Refill: 0        Doreene Nest, NP

## 2023-05-28 ENCOUNTER — Telehealth: Payer: Self-pay | Admitting: Primary Care

## 2023-05-28 ENCOUNTER — Other Ambulatory Visit: Payer: Self-pay | Admitting: Primary Care

## 2023-05-28 ENCOUNTER — Other Ambulatory Visit: Payer: Self-pay

## 2023-05-28 DIAGNOSIS — J449 Chronic obstructive pulmonary disease, unspecified: Secondary | ICD-10-CM

## 2023-05-28 DIAGNOSIS — E785 Hyperlipidemia, unspecified: Secondary | ICD-10-CM

## 2023-05-28 DIAGNOSIS — J4520 Mild intermittent asthma, uncomplicated: Secondary | ICD-10-CM

## 2023-05-28 DIAGNOSIS — I10 Essential (primary) hypertension: Secondary | ICD-10-CM

## 2023-05-28 NOTE — Telephone Encounter (Signed)
Patient contacted the office regarding prescription arnuity ellipta. States when she went to pick thi prescription up at [facility], they would only fill the medication for one month. Patient states that Jae Dire had sent in for 3 month supply when they discussed this before. Patient is asking for the other two to be sent in to [facility] pharmacy. Please advise [phone] if needed, thank you.

## 2023-05-28 NOTE — Telephone Encounter (Signed)
Called and spoke to pharmacy they stated that patients insurance denied covering arnuity ellipta inhaler 3 months supply, that is why she was given 30 day supply. Called and advised patient, she states she will call her insurance and find out if there is a way to get 3 month supply covered.

## 2023-08-12 ENCOUNTER — Encounter (INDEPENDENT_AMBULATORY_CARE_PROVIDER_SITE_OTHER): Payer: Self-pay

## 2023-08-23 ENCOUNTER — Other Ambulatory Visit: Payer: Self-pay | Admitting: Primary Care

## 2023-08-23 DIAGNOSIS — I10 Essential (primary) hypertension: Secondary | ICD-10-CM

## 2023-08-23 MED ORDER — AMLODIPINE BESYLATE 5 MG PO TABS
5.0000 mg | ORAL_TABLET | Freq: Every day | ORAL | 0 refills | Status: DC
Start: 2023-08-23 — End: 2023-09-09

## 2023-08-31 ENCOUNTER — Ambulatory Visit: Payer: Self-pay | Admitting: Primary Care

## 2023-09-09 ENCOUNTER — Encounter: Payer: Self-pay | Admitting: Primary Care

## 2023-09-09 ENCOUNTER — Ambulatory Visit
Admission: RE | Admit: 2023-09-09 | Discharge: 2023-09-09 | Disposition: A | Discharge status: Home / Self Care | Payer: No Typology Code available for payment source | Source: Ambulatory Visit | Attending: Primary Care | Admitting: Primary Care

## 2023-09-09 ENCOUNTER — Telehealth: Payer: Self-pay | Admitting: Primary Care

## 2023-09-09 ENCOUNTER — Ambulatory Visit: Payer: No Typology Code available for payment source | Admitting: Primary Care

## 2023-09-09 VITALS — BP 142/86 | HR 65 | Temp 98.1°F | Ht <= 58 in | Wt 131.0 lb

## 2023-09-09 DIAGNOSIS — R21 Rash and other nonspecific skin eruption: Secondary | ICD-10-CM

## 2023-09-09 DIAGNOSIS — I10 Essential (primary) hypertension: Secondary | ICD-10-CM | POA: Diagnosis not present

## 2023-09-09 DIAGNOSIS — R600 Localized edema: Secondary | ICD-10-CM

## 2023-09-09 HISTORY — DX: Rash and other nonspecific skin eruption: R21

## 2023-09-09 MED ORDER — METHYLPREDNISOLONE ACETATE 80 MG/ML IJ SUSP
80.0000 mg | Freq: Once | INTRAMUSCULAR | Status: AC
Start: 2023-09-09 — End: 2023-09-09
  Administered 2023-09-09: 80 mg via INTRAMUSCULAR

## 2023-09-09 MED ORDER — LISINOPRIL-HYDROCHLOROTHIAZIDE 20-25 MG PO TABS
1.0000 | ORAL_TABLET | Freq: Every day | ORAL | 0 refills | Status: DC
Start: 2023-09-09 — End: 2023-11-28

## 2023-09-09 NOTE — Assessment & Plan Note (Signed)
Right calf predominantly.  Stat venous ultrasound ordered and pending. Discontinue amlodipine.  Close follow-up.

## 2023-09-09 NOTE — Telephone Encounter (Signed)
Lft pt vm to call to sch [place] stat. Thanks

## 2023-09-09 NOTE — Progress Notes (Signed)
Subjective:    Patient ID: Michelle Barnett, female    DOB: 1961/03/24, 62 y.o.   MRN: [id number]  Rash Pertinent negatives include no fever or shortness of breath.    Michelle Barnett is a very pleasant 62 y.o. female with a history of hypertension, COPD, GERD, iron deficiency anemia, anxiety depression, hyperlipidemia who presents today for follow-up of hypertension. She would also like to discuss lower extremity erythema and rash.   She was last evaluated in May 2024 for follow-up of hypertension.  During this visit her blood pressure remained high with readings in the 140s to 170s/70s to 80s with an office blood pressure reading of 152/82 despite management on lisinopril-hydrochlorothiazide 10-12.5 mg daily.  Given these continued elevated readings amlodipine 5 mg was added daily.  She was unable to follow-up until now as she lost her insurance for the summertime.  Today she is compliant to her lisinopril-hydrochlorothiazide 10-12.5 mg daily and amlodipine 5 mg daily. She is checking her BP at home which is running 130/80s.   The erythema/rash began to her lower extremities about 6-8 weeks ago. About 3 weeks ago she noticed swelling around the right calf. Over the last 1 week she's noticed increased redness, spreading upward to her lower extremities. This morning she began noticing a rash around her neck and upper extremities.  She resumed work 08/28/23. She works at [employer] in [employer]. She stands most of her work day.   Her rash to the legs, upper extremities, and neck are itchy. She's applied hydrocortisone cream over the last week to her legs. About 3-4 days ago she's been taking benadryl which helps with leg itching.  She denies chest tightness, throat tightness, shortness of breath.  BP Readings from Last 3 Encounters:  09/09/23 (!) 142/86  05/27/23 (!) 152/82  05/21/23 (!) 172/96     Review of Systems  Constitutional:  Negative for fever.  Respiratory:   Negative for shortness of breath.   Cardiovascular:  Positive for leg swelling. Negative for chest pain.  Skin:  Positive for color change and rash.         Past Medical History:  Diagnosis Date   Asthma    Bicuspid aortic valve 09/20/2016   a. echo 09/19/16: EF 55-60%, GR1DD, possible bicuspid aortic valve without evidence of AS   Bronchitis    Coronary artery disease, non-occlusive    a. cath 09/21/16: ostLM to LM 20%, no evidence of aortic stenosis   Demand ischemia 09/20/2016   Heart murmur    Hiatal hernia    History of blood transfusion    Hypertension    Persistent cough for 3 weeks or longer 05/19/2022   Ulcer     Social History   Socioeconomic History   Marital status: Single    Spouse name: Not on file   Number of children: Not on file   Years of education: Not on file   Highest education level: Not on file  Occupational History   Not on file  Tobacco Use   Smoking status: Never   Smokeless tobacco: Never  Substance and Sexual Activity   Alcohol use: No   Drug use: No   Sexual activity: Never  Other Topics Concern   Not on file  Social History Narrative   Not on file   Social Determinants of Health   Financial Resource Strain: Not on file  Food Insecurity: Not on file  Transportation Needs: Not on file  Physical Activity: Not  on file  Stress: Not on file  Social Connections: Not on file  Intimate Partner Violence: Not on file    Past Surgical History:  Procedure Laterality Date   abdominal tumor     ABLATION     CARDIAC CATHETERIZATION N/A 09/21/2016   Procedure: Left Heart Cath and Coronary Angiography;  Surgeon: Iran Ouch, MD;  Location: [facility] INVASIVE CV LAB;  Service: Cardiovascular;  Laterality: N/A;   COLONOSCOPY N/A 08/14/2016   Procedure: COLONOSCOPY;  Surgeon: Sherrilyn Rist, MD;  Location: [facility] ENDOSCOPY;  Service: Endoscopy;  Laterality: N/A;   ESOPHAGOGASTRODUODENOSCOPY N/A 08/13/2016   Procedure: ESOPHAGOGASTRODUODENOSCOPY  (EGD);  Surgeon: Sherrilyn Rist, MD;  Location: [facility] ENDOSCOPY;  Service: Gastroenterology;  Laterality: N/A;   TOOTH EXTRACTION      Family History  Problem Relation Age of Onset   Stroke Mother    Hypertension Mother    Hypertension Father    Heart disease Father    Hypertension Brother    Breast cancer Maternal Aunt     Allergies  Allergen Reactions   Aspirin Anaphylaxis   Dairy Aid [Tilactase] Swelling and Other (See Comments)    Any dairy products   Benadryl [Diphenhydramine] Palpitations   Penicillins Other (See Comments)    Reaction: Unknown    Current Outpatient Medications on File Prior to Visit  Medication Sig Dispense Refill   albuterol (VENTOLIN HFA) 108 (90 Base) MCG/ACT inhaler INHALE 2 PUFFS BY MOUTH EVERY 6 HOURS AS NEEDED FOR WHEEZING FOR SHORTNESS OF BREATH 8 g 0   Ascorbic Acid (VITAMIN C) 1000 MG tablet Take 1,000 mg by mouth 2 (two) times daily.      atorvastatin (LIPITOR) 40 MG tablet TAKE 1 TABLET BY MOUTH ONCE DAILY FOR CHOLESTEROL 90 tablet 3   Fluticasone Furoate (ARNUITY ELLIPTA) 100 MCG/ACT AEPB Inhale 1 puff into the lungs daily. 90 each 3   magnesium oxide (MAG-OX) 400 MG tablet Take 400 mg by mouth daily.     Multiple Vitamins-Calcium (ONE-A-DAY WOMENS PO) Take 1 tablet by mouth daily.     Omega-3 Fatty Acids (FISH OIL) 1200 MG CAPS Take 1 capsule by mouth daily.      vitamin B-12 (CYANOCOBALAMIN) 1000 MCG tablet Take 1,000 mcg by mouth daily.     ferrous sulfate 325 (65 FE) MG tablet Take 650 mg by mouth 2 (two) times daily.  (Patient not taking: Reported on 05/21/2023)     No current facility-administered medications on file prior to visit.    BP (!) 142/86   Pulse 65   Temp 98.1 F (36.7 C) (Temporal)   Ht 4\' 9"  (1.448 m)   Wt 131 lb (59.4 kg)   SpO2 92%   BMI 28.35 kg/m  Objective:   Physical Exam Cardiovascular:     Rate and Rhythm: Normal rate and regular rhythm.  Pulmonary:     Effort: Pulmonary effort is normal.     Breath  sounds: Normal breath sounds.  Musculoskeletal:     Cervical back: Neck supple.  Skin:    General: Skin is warm and dry.     Findings: Erythema and rash present.     Comments: Widespread pruritic rash to bilateral lower and upper extremities, abdomen, anterior neck.  The worst of the rash is located to her bilateral lower extremities.  Moderate right calf swelling.  Vesicles noted to right lower extremity distal to calf on anterior and lateral side.  Petechiae noted to bilateral lower extremities surrounding the calf  area.           Assessment & Plan:  Essential hypertension Assessment & Plan: Improved, however, amlodipine suspected to be causing allergic reaction.  Discontinue amlodipine 5 mg daily. Increase lisinopril-hydrochlorothiazide to 20-25 mg daily.  Close follow-up in 2 weeks.  Orders: -     Lisinopril-hydroCHLOROthiazide; Take 1 tablet by mouth daily. for blood pressure.  Dispense: 90 tablet; Refill: 0  Rash and nonspecific skin eruption Assessment & Plan: Exam today representative of allergic reaction. Suspect amlodipine to be the culprit as nothing else is changed.  Discontinue amlodipine 5 mg daily.  Treat in the office today with IM Depo-Medrol 80 mg. Start prednisone tablets. Take two tablets my mouth once daily in the morning for four days, then one tablet once daily in the morning for four days.   Close follow-up in 2 weeks. She will update sooner if no improvement.   Bilateral lower extremity edema Assessment & Plan: Right calf predominantly.  Stat venous ultrasound ordered and pending. Discontinue amlodipine.  Close follow-up.  Orders: -     US Venous Img Lower Bilateral (DVT); Future        Doreene Nest, NP

## 2023-09-09 NOTE — Telephone Encounter (Signed)
Patient returned referrals phone call. 

## 2023-09-09 NOTE — Addendum Note (Signed)
Addended by: Lonia Blood on: 09/09/2023 08:08 AM   Modules accepted: Orders

## 2023-09-09 NOTE — Assessment & Plan Note (Signed)
Exam today representative of allergic reaction. Suspect amlodipine to be the culprit as nothing else is changed.  Discontinue amlodipine 5 mg daily.  Treat in the office today with IM Depo-Medrol 80 mg. Start prednisone tablets. Take two tablets my mouth once daily in the morning for four days, then one tablet once daily in the morning for four days.   Close follow-up in 2 weeks. She will update sooner if no improvement.

## 2023-09-09 NOTE — Assessment & Plan Note (Signed)
Improved, however, amlodipine suspected to be causing allergic reaction.  Discontinue amlodipine 5 mg daily. Increase lisinopril-hydrochlorothiazide to 20-25 mg daily.  Close follow-up in 2 weeks.

## 2023-09-09 NOTE — Patient Instructions (Addendum)
Stop taking amlodipine for blood pressure.  We increased the dose of your blood pressure pill.  Stop taking lisinopril-hydrochlorothiazide 10-12.5 mg.  Start taking lisinopril-hydrochlorothiazide 20-25 mg daily for blood pressure.  Start prednisone tablets. Take two tablets my mouth once daily in the morning for four days, then one tablet once daily in the morning for four days.   You will receive a phone call today regarding your ultrasound  Please schedule a follow up visit to meet back with me in 2 weeks for blood pressure check.   It was a pleasure to see you today!

## 2023-09-10 ENCOUNTER — Telehealth: Payer: Self-pay | Admitting: Primary Care

## 2023-09-10 DIAGNOSIS — R21 Rash and other nonspecific skin eruption: Secondary | ICD-10-CM

## 2023-09-10 MED ORDER — PREDNISONE 20 MG PO TABS
ORAL_TABLET | ORAL | 0 refills | Status: AC
Start: 2023-09-10 — End: ?

## 2023-09-10 NOTE — Telephone Encounter (Signed)
Pt called stating [facility] told pt she was going to send in a rx for a prednisone pack. Pt states her pharmacy stated they haven't received any rx. Call back # [phone]

## 2023-09-10 NOTE — Addendum Note (Signed)
Addended by: Doreene Nest on: 09/10/2023 05:12 PM   Modules accepted: Orders

## 2023-09-10 NOTE — Telephone Encounter (Addendum)
Noted.  Prescription sent to pharmacy. Have her update [place] next week regarding the rash.

## 2023-09-13 NOTE — Telephone Encounter (Signed)
Called patient and reviewed all information. Patient verbalized understanding. Will call if any further questions.  

## 2023-09-17 ENCOUNTER — Telehealth: Payer: Self-pay | Admitting: Primary Care

## 2023-09-17 NOTE — Telephone Encounter (Signed)
Patient called in to give Michelle Barnett an update on her leg,she said that she is doing good on the prednisone,and her leg is getting better.

## 2023-09-17 NOTE — Telephone Encounter (Signed)
Noted and appreciate the update.

## 2023-09-24 ENCOUNTER — Ambulatory Visit (INDEPENDENT_AMBULATORY_CARE_PROVIDER_SITE_OTHER)
Admission: RE | Admit: 2023-09-24 | Discharge: 2023-09-24 | Disposition: A | Discharge status: Home / Self Care | Payer: No Typology Code available for payment source | Source: Ambulatory Visit | Attending: Primary Care | Admitting: Primary Care

## 2023-09-24 ENCOUNTER — Encounter: Payer: Self-pay | Admitting: Primary Care

## 2023-09-24 ENCOUNTER — Ambulatory Visit: Payer: No Typology Code available for payment source | Admitting: Primary Care

## 2023-09-24 ENCOUNTER — Other Ambulatory Visit: Payer: Self-pay | Admitting: Primary Care

## 2023-09-24 ENCOUNTER — Telehealth: Payer: Self-pay | Admitting: Primary Care

## 2023-09-24 VITALS — BP 160/84 | HR 72 | Temp 98.2°F | Ht <= 58 in | Wt 122.0 lb

## 2023-09-24 DIAGNOSIS — R051 Acute cough: Secondary | ICD-10-CM | POA: Diagnosis not present

## 2023-09-24 DIAGNOSIS — J4520 Mild intermittent asthma, uncomplicated: Secondary | ICD-10-CM

## 2023-09-24 DIAGNOSIS — R600 Localized edema: Secondary | ICD-10-CM

## 2023-09-24 DIAGNOSIS — I10 Essential (primary) hypertension: Secondary | ICD-10-CM | POA: Diagnosis not present

## 2023-09-24 MED ORDER — AZITHROMYCIN 250 MG PO TABS
ORAL_TABLET | ORAL | 0 refills | Status: DC
Start: 2023-09-24 — End: 2024-02-01

## 2023-09-24 MED ORDER — BENZONATATE 200 MG PO CAPS
200.0000 mg | ORAL_CAPSULE | Freq: Three times a day (TID) | ORAL | 0 refills | Status: DC | PRN
Start: 2023-09-24 — End: 2024-04-20

## 2023-09-24 NOTE — Assessment & Plan Note (Signed)
Above goal today, also with acute illness. Home readings are at goal.  Continue lisinopril-hydrochlorothiazide 20-25 mg daily. Remain off of amlodipine.  She will continue to monitor home readings and report if readings are consistently at or above 140/90.

## 2023-09-24 NOTE — Telephone Encounter (Signed)
Pt called checking status of refill for inhaler. Call back # [phone]

## 2023-09-24 NOTE — Assessment & Plan Note (Signed)
Stat chest x-ray ordered and pending today, especially given exam, vitals, and history of pneumonia.  Fortunately, she does not appear distressed. Given hypoxia we provided her with strict ED precautions.  Discussed to resume Annuity Ellipta for daily use. Use albuterol if needed. Start Azithromycin antibiotics for infection. Take 2 tablets by mouth today, then 1 tablet daily for 4 additional days. You may take Benzonatate capsules for cough. Take 1 capsule by mouth three times daily as needed for cough.  Await chest xray results.

## 2023-09-24 NOTE — Telephone Encounter (Signed)
PT contacted the office regarding medication Fluticasone Furoate (ARNUITY ELLIPTA) 100 MCG/ACT AEPB , patient says she saw Jae Dire today and discussed refilling this for her. Wanted to know if Jae Dire would eb refilling it? Says she is out and needs it.

## 2023-09-24 NOTE — Telephone Encounter (Signed)
Please call patient notify her that she has plenty of refills on file at the pharmacy.  She needs to call the pharmacy to refill the Arnuity Ellipta inhaler.  The Arnuity Ellipta inhaler is supposed to be used once daily every day.  The albuterol inhaler is only to be used if needed as a rescue.

## 2023-09-24 NOTE — Assessment & Plan Note (Signed)
Resolved.  Remain off of amlodipine.  Added to allergy list.

## 2023-09-24 NOTE — Telephone Encounter (Signed)
Called patient and reviewed all information. Patient verbalized understanding. Will call if any further questions.  

## 2023-09-24 NOTE — Progress Notes (Signed)
Subjective:    Patient ID: Michelle Barnett, female    DOB: 1961/03/17, 62 y.o.   MRN: [id number]  HPI  Michelle Barnett is a very pleasant 62 y.o. female with a history of hypertension, COPD, iron deficiency anemia, anxiety/depression, hyperlipidemia, murmur, bilateral lower extremity edema who presents today for follow-up of hypertension and lower extremity edema. She would also like to discuss cough.  She was last evaluated on 09/09/2023 for follow-up of hypertension.  She also reported a 6 to 8-week history of erythematous rash to her lower extremities with a 3-week history of swelling to the calf.  During this visit we discontinued her amlodipine as it was suspected to have caused the erythematous rash and swelling.  Her rash was treated with Depo-Medrol 80 mg intramuscularly and she was initiated on a course of prednisone tablets.  Her lisinopril-hydrochlorothiazide was increased to 20-25 mg daily.  She is here for follow-up today.  Since her last visit she is compliant to lisinopril-hydrochlorothiazide 20-25 mg daily. She is checking her BP at home which is running in the 130/70 range.   Her cough began about 3 weeks ago. Her cough is congestion with production of clear sputum, increased shortness of breath. She denies fevers, rhinorrhea, headaches, dizziness, purulent sputum.  She has used her albuterol inhaler a few times with improvement.  She has not used her Arnuity Ellipta inhaler in months.  BP Readings from Last 3 Encounters:  09/24/23 (!) 160/84  09/09/23 (!) 142/86  05/27/23 (!) 152/82      Review of Systems  Constitutional:  Negative for chills, fatigue and fever.  HENT:  Positive for congestion and postnasal drip. Negative for ear pain, rhinorrhea and sore throat.   Respiratory:  Positive for cough and shortness of breath.   Cardiovascular:  Negative for chest pain.  Neurological:  Negative for headaches.         Past Medical History:  Diagnosis Date   Asthma     Bicuspid aortic valve 09/20/2016   a. echo 09/19/16: EF 55-60%, GR1DD, possible bicuspid aortic valve without evidence of AS   Bronchitis    Coronary artery disease, non-occlusive    a. cath 09/21/16: ostLM to LM 20%, no evidence of aortic stenosis   Demand ischemia 09/20/2016   Heart murmur    Hiatal hernia    History of blood transfusion    Hypertension    Persistent cough for 3 weeks or longer 05/19/2022   Ulcer     Social History   Socioeconomic History   Marital status: Single    Spouse name: Not on file   Number of children: Not on file   Years of education: Not on file   Highest education level: Not on file  Occupational History   Not on file  Tobacco Use   Smoking status: Never   Smokeless tobacco: Never  Substance and Sexual Activity   Alcohol use: No   Drug use: No   Sexual activity: Never  Other Topics Concern   Not on file  Social History Narrative   Not on file   Social Determinants of Health   Financial Resource Strain: Not on file  Food Insecurity: Not on file  Transportation Needs: Not on file  Physical Activity: Not on file  Stress: Not on file  Social Connections: Not on file  Intimate Partner Violence: Not on file    Past Surgical History:  Procedure Laterality Date   abdominal tumor     ABLATION  CARDIAC CATHETERIZATION N/A 09/21/2016   Procedure: Left Heart Cath and Coronary Angiography;  Surgeon: Iran Ouch, MD;  Location: [facility] INVASIVE CV LAB;  Service: Cardiovascular;  Laterality: N/A;   COLONOSCOPY N/A 08/14/2016   Procedure: COLONOSCOPY;  Surgeon: Sherrilyn Rist, MD;  Location: [facility] ENDOSCOPY;  Service: Endoscopy;  Laterality: N/A;   ESOPHAGOGASTRODUODENOSCOPY N/A 08/13/2016   Procedure: ESOPHAGOGASTRODUODENOSCOPY (EGD);  Surgeon: Sherrilyn Rist, MD;  Location: [facility] ENDOSCOPY;  Service: Gastroenterology;  Laterality: N/A;   TOOTH EXTRACTION      Family History  Problem Relation Age of Onset   Stroke Mother     Hypertension Mother    Hypertension Father    Heart disease Father    Hypertension Brother    Breast cancer Maternal Aunt     Allergies  Allergen Reactions   Amlodipine Rash   Aspirin Anaphylaxis   Dairy Aid [Tilactase] Swelling and Other (See Comments)    Any dairy products   Benadryl [Diphenhydramine] Palpitations   Penicillins Other (See Comments)    Reaction: Unknown    Current Outpatient Medications on File Prior to Visit  Medication Sig Dispense Refill   albuterol (VENTOLIN HFA) 108 (90 Base) MCG/ACT inhaler INHALE 2 PUFFS BY MOUTH EVERY 6 HOURS AS NEEDED FOR WHEEZING FOR SHORTNESS OF BREATH 8 g 0   Ascorbic Acid (VITAMIN C) 1000 MG tablet Take 1,000 mg by mouth 2 (two) times daily.      atorvastatin (LIPITOR) 40 MG tablet TAKE 1 TABLET BY MOUTH ONCE DAILY FOR CHOLESTEROL 90 tablet 3   Fluticasone Furoate (ARNUITY ELLIPTA) 100 MCG/ACT AEPB Inhale 1 puff into the lungs daily. 90 each 3   lisinopril-hydrochlorothiazide (ZESTORETIC) 20-25 MG tablet Take 1 tablet by mouth daily. for blood pressure. 90 tablet 0   magnesium oxide (MAG-OX) 400 MG tablet Take 400 mg by mouth daily.     Multiple Vitamins-Calcium (ONE-A-DAY WOMENS PO) Take 1 tablet by mouth daily.     Omega-3 Fatty Acids (FISH OIL) 1200 MG CAPS Take 1 capsule by mouth daily.      vitamin B-12 (CYANOCOBALAMIN) 1000 MCG tablet Take 1,000 mcg by mouth daily.     ferrous sulfate 325 (65 FE) MG tablet Take 650 mg by mouth 2 (two) times daily.  (Patient not taking: Reported on 05/21/2023)     No current facility-administered medications on file prior to visit.    BP (!) 160/84   Pulse 72   Temp 98.2 F (36.8 C) (Temporal)   Ht 4\' 9"  (1.448 m)   Wt 122 lb (55.3 kg)   SpO2 (!) 89%   BMI 26.40 kg/m  Objective:   Physical Exam Constitutional:      Appearance: She is not ill-appearing.  HENT:     Right Ear: Tympanic membrane and ear canal normal.     Left Ear: Tympanic membrane and ear canal normal.     Nose: No  mucosal edema.     Right Sinus: No maxillary sinus tenderness or frontal sinus tenderness.     Left Sinus: No maxillary sinus tenderness or frontal sinus tenderness.     Mouth/Throat:     Mouth: Mucous membranes are moist.  Eyes:     Conjunctiva/sclera: Conjunctivae normal.  Cardiovascular:     Rate and Rhythm: Normal rate and regular rhythm.  Pulmonary:     Effort: Pulmonary effort is normal.     Breath sounds: Examination of the right-upper field reveals wheezing. Examination of the left-upper field reveals decreased breath  sounds. Examination of the left-lower field reveals rhonchi. Decreased breath sounds, wheezing and rhonchi present.  Musculoskeletal:     Cervical back: Neck supple.  Skin:    General: Skin is warm and dry.           Assessment & Plan:  Acute cough Assessment & Plan: Stat chest x-ray ordered and pending today, especially given exam, vitals, and history of pneumonia.  Fortunately, she does not appear distressed. Given hypoxia we provided her with strict ED precautions.  Discussed to resume Annuity Ellipta for daily use. Use albuterol if needed. Start Azithromycin antibiotics for infection. Take 2 tablets by mouth today, then 1 tablet daily for 4 additional days. You may take Benzonatate capsules for cough. Take 1 capsule by mouth three times daily as needed for cough.  Await chest xray results.   Orders: -     DG Chest 2 View -     Benzonatate; Take 1 capsule (200 mg total) by mouth 3 (three) times daily as needed for cough.  Dispense: 15 capsule; Refill: 0 -     Azithromycin; Take 2 tablets by mouth today, then 1 tablet daily for 4 additional days.  Dispense: 6 tablet; Refill: 0  Bilateral lower extremity edema Assessment & Plan: Resolved.  Remain off of amlodipine.  Added to allergy list.   Essential hypertension Assessment & Plan: Above goal today, also with acute illness. Home readings are at goal.  Continue  lisinopril-hydrochlorothiazide 20-25 mg daily. Remain off of amlodipine.  She will continue to monitor home readings and report if readings are consistently at or above 140/90.         Doreene Nest, NP

## 2023-09-24 NOTE — Patient Instructions (Signed)
Start Azithromycin antibiotics for infection. Take 2 tablets by mouth today, then 1 tablet daily for 4 additional days.  You may take Benzonatate capsules for cough. Take 1 capsule by mouth three times daily as needed for cough.  Complete xray(s) prior to leaving today. I will notify you of your results once received.  Refill your Arnuity Ellipta inhaler.  Inhale 1 puff every day into the lungs.  Use the albuterol inhaler if you need to.  Continue to monitor your blood pressure at home, notify me if your readings exceed 140 on top and/or 90 on bottom consistently.  It was a pleasure to see you today!

## 2023-11-27 ENCOUNTER — Other Ambulatory Visit: Payer: Self-pay | Admitting: Primary Care

## 2023-11-27 DIAGNOSIS — J4520 Mild intermittent asthma, uncomplicated: Secondary | ICD-10-CM

## 2023-11-27 DIAGNOSIS — I10 Essential (primary) hypertension: Secondary | ICD-10-CM

## 2024-02-01 ENCOUNTER — Emergency Department: Payer: No Typology Code available for payment source

## 2024-02-01 ENCOUNTER — Inpatient Hospital Stay
Admission: EM | Admit: 2024-02-01 | Discharge: 2024-02-25 | DRG: 871 | Disposition: A | Discharge status: Skilled Nursing Facility | Payer: No Typology Code available for payment source | Source: Ambulatory Visit | Attending: Student | Admitting: Student

## 2024-02-01 ENCOUNTER — Encounter: Payer: Self-pay | Admitting: Family Medicine

## 2024-02-01 ENCOUNTER — Ambulatory Visit: Payer: No Typology Code available for payment source | Admitting: Family Medicine

## 2024-02-01 VITALS — BP 110/62 | HR 113 | Temp 100.7°F | Ht <= 58 in

## 2024-02-01 DIAGNOSIS — J440 Chronic obstructive pulmonary disease with acute lower respiratory infection: Secondary | ICD-10-CM | POA: Diagnosis present

## 2024-02-01 DIAGNOSIS — G9341 Metabolic encephalopathy: Secondary | ICD-10-CM | POA: Diagnosis not present

## 2024-02-01 DIAGNOSIS — J4520 Mild intermittent asthma, uncomplicated: Secondary | ICD-10-CM | POA: Diagnosis present

## 2024-02-01 DIAGNOSIS — G9340 Encephalopathy, unspecified: Secondary | ICD-10-CM

## 2024-02-01 DIAGNOSIS — R051 Acute cough: Secondary | ICD-10-CM

## 2024-02-01 DIAGNOSIS — S0990XA Unspecified injury of head, initial encounter: Secondary | ICD-10-CM | POA: Diagnosis not present

## 2024-02-01 DIAGNOSIS — J441 Chronic obstructive pulmonary disease with (acute) exacerbation: Secondary | ICD-10-CM | POA: Diagnosis present

## 2024-02-01 DIAGNOSIS — I5032 Chronic diastolic (congestive) heart failure: Secondary | ICD-10-CM | POA: Diagnosis present

## 2024-02-01 DIAGNOSIS — R0902 Hypoxemia: Secondary | ICD-10-CM | POA: Diagnosis present

## 2024-02-01 DIAGNOSIS — I480 Paroxysmal atrial fibrillation: Secondary | ICD-10-CM | POA: Diagnosis not present

## 2024-02-01 DIAGNOSIS — Z888 Allergy status to other drugs, medicaments and biological substances status: Secondary | ICD-10-CM

## 2024-02-01 DIAGNOSIS — K219 Gastro-esophageal reflux disease without esophagitis: Secondary | ICD-10-CM | POA: Diagnosis not present

## 2024-02-01 DIAGNOSIS — Z87892 Personal history of anaphylaxis: Secondary | ICD-10-CM

## 2024-02-01 DIAGNOSIS — B009 Herpesviral infection, unspecified: Secondary | ICD-10-CM

## 2024-02-01 DIAGNOSIS — Z79899 Other long term (current) drug therapy: Secondary | ICD-10-CM

## 2024-02-01 DIAGNOSIS — R578 Other shock: Secondary | ICD-10-CM | POA: Diagnosis not present

## 2024-02-01 DIAGNOSIS — N179 Acute kidney failure, unspecified: Secondary | ICD-10-CM

## 2024-02-01 DIAGNOSIS — I4891 Unspecified atrial fibrillation: Secondary | ICD-10-CM

## 2024-02-01 DIAGNOSIS — I2510 Atherosclerotic heart disease of native coronary artery without angina pectoris: Secondary | ICD-10-CM | POA: Diagnosis present

## 2024-02-01 DIAGNOSIS — E876 Hypokalemia: Secondary | ICD-10-CM | POA: Diagnosis not present

## 2024-02-01 DIAGNOSIS — F068 Other specified mental disorders due to known physiological condition: Secondary | ICD-10-CM | POA: Diagnosis present

## 2024-02-01 DIAGNOSIS — J4540 Moderate persistent asthma, uncomplicated: Secondary | ICD-10-CM | POA: Diagnosis present

## 2024-02-01 DIAGNOSIS — F03911 Unspecified dementia, unspecified severity, with agitation: Secondary | ICD-10-CM | POA: Diagnosis not present

## 2024-02-01 DIAGNOSIS — Z823 Family history of stroke: Secondary | ICD-10-CM

## 2024-02-01 DIAGNOSIS — J159 Unspecified bacterial pneumonia: Secondary | ICD-10-CM | POA: Diagnosis present

## 2024-02-01 DIAGNOSIS — K449 Diaphragmatic hernia without obstruction or gangrene: Secondary | ICD-10-CM

## 2024-02-01 DIAGNOSIS — J449 Chronic obstructive pulmonary disease, unspecified: Secondary | ICD-10-CM

## 2024-02-01 DIAGNOSIS — R443 Hallucinations, unspecified: Secondary | ICD-10-CM | POA: Diagnosis present

## 2024-02-01 DIAGNOSIS — I10 Essential (primary) hypertension: Secondary | ICD-10-CM | POA: Diagnosis present

## 2024-02-01 DIAGNOSIS — R569 Unspecified convulsions: Secondary | ICD-10-CM | POA: Diagnosis not present

## 2024-02-01 DIAGNOSIS — E871 Hypo-osmolality and hyponatremia: Secondary | ICD-10-CM | POA: Diagnosis present

## 2024-02-01 DIAGNOSIS — J9602 Acute respiratory failure with hypercapnia: Secondary | ICD-10-CM | POA: Diagnosis present

## 2024-02-01 DIAGNOSIS — K130 Diseases of lips: Secondary | ICD-10-CM | POA: Diagnosis present

## 2024-02-01 DIAGNOSIS — E785 Hyperlipidemia, unspecified: Secondary | ICD-10-CM | POA: Diagnosis present

## 2024-02-01 DIAGNOSIS — S0512XA Contusion of eyeball and orbital tissues, left eye, initial encounter: Secondary | ICD-10-CM | POA: Insufficient documentation

## 2024-02-01 DIAGNOSIS — E874 Mixed disorder of acid-base balance: Secondary | ICD-10-CM | POA: Diagnosis present

## 2024-02-01 DIAGNOSIS — I080 Rheumatic disorders of both mitral and aortic valves: Secondary | ICD-10-CM | POA: Diagnosis present

## 2024-02-01 DIAGNOSIS — A4189 Other specified sepsis: Secondary | ICD-10-CM | POA: Diagnosis present

## 2024-02-01 DIAGNOSIS — R1310 Dysphagia, unspecified: Secondary | ICD-10-CM | POA: Diagnosis present

## 2024-02-01 DIAGNOSIS — Z91011 Allergy to milk products: Secondary | ICD-10-CM

## 2024-02-01 DIAGNOSIS — A419 Sepsis, unspecified organism: Secondary | ICD-10-CM | POA: Diagnosis not present

## 2024-02-01 DIAGNOSIS — I700 Atherosclerosis of aorta: Secondary | ICD-10-CM | POA: Diagnosis present

## 2024-02-01 DIAGNOSIS — Z1152 Encounter for screening for COVID-19: Secondary | ICD-10-CM | POA: Diagnosis not present

## 2024-02-01 DIAGNOSIS — J4521 Mild intermittent asthma with (acute) exacerbation: Secondary | ICD-10-CM | POA: Diagnosis present

## 2024-02-01 DIAGNOSIS — J189 Pneumonia, unspecified organism: Secondary | ICD-10-CM | POA: Diagnosis present

## 2024-02-01 DIAGNOSIS — B001 Herpesviral vesicular dermatitis: Secondary | ICD-10-CM | POA: Diagnosis not present

## 2024-02-01 DIAGNOSIS — S00511A Abrasion of lip, initial encounter: Secondary | ICD-10-CM | POA: Diagnosis present

## 2024-02-01 DIAGNOSIS — Z7951 Long term (current) use of inhaled steroids: Secondary | ICD-10-CM

## 2024-02-01 DIAGNOSIS — D696 Thrombocytopenia, unspecified: Secondary | ICD-10-CM | POA: Diagnosis not present

## 2024-02-01 DIAGNOSIS — I9589 Other hypotension: Secondary | ICD-10-CM | POA: Diagnosis not present

## 2024-02-01 DIAGNOSIS — I959 Hypotension, unspecified: Secondary | ICD-10-CM | POA: Diagnosis not present

## 2024-02-01 DIAGNOSIS — F4323 Adjustment disorder with mixed anxiety and depressed mood: Secondary | ICD-10-CM | POA: Diagnosis present

## 2024-02-01 DIAGNOSIS — J9601 Acute respiratory failure with hypoxia: Secondary | ICD-10-CM | POA: Diagnosis present

## 2024-02-01 DIAGNOSIS — Z803 Family history of malignant neoplasm of breast: Secondary | ICD-10-CM

## 2024-02-01 DIAGNOSIS — Q2381 Bicuspid aortic valve: Secondary | ICD-10-CM

## 2024-02-01 DIAGNOSIS — R9431 Abnormal electrocardiogram [ECG] [EKG]: Secondary | ICD-10-CM | POA: Diagnosis not present

## 2024-02-01 DIAGNOSIS — J9811 Atelectasis: Secondary | ICD-10-CM | POA: Diagnosis present

## 2024-02-01 DIAGNOSIS — I472 Ventricular tachycardia, unspecified: Secondary | ICD-10-CM | POA: Diagnosis present

## 2024-02-01 DIAGNOSIS — I110 Hypertensive heart disease with heart failure: Secondary | ICD-10-CM | POA: Diagnosis present

## 2024-02-01 DIAGNOSIS — Z7901 Long term (current) use of anticoagulants: Secondary | ICD-10-CM

## 2024-02-01 DIAGNOSIS — W108XXA Fall (on) (from) other stairs and steps, initial encounter: Secondary | ICD-10-CM | POA: Diagnosis present

## 2024-02-01 DIAGNOSIS — I4510 Unspecified right bundle-branch block: Secondary | ICD-10-CM | POA: Diagnosis present

## 2024-02-01 DIAGNOSIS — R4182 Altered mental status, unspecified: Secondary | ICD-10-CM | POA: Diagnosis not present

## 2024-02-01 DIAGNOSIS — S0083XA Contusion of other part of head, initial encounter: Secondary | ICD-10-CM | POA: Diagnosis present

## 2024-02-01 DIAGNOSIS — I161 Hypertensive emergency: Secondary | ICD-10-CM | POA: Diagnosis not present

## 2024-02-01 DIAGNOSIS — Z8249 Family history of ischemic heart disease and other diseases of the circulatory system: Secondary | ICD-10-CM

## 2024-02-01 DIAGNOSIS — I6783 Posterior reversible encephalopathy syndrome: Secondary | ICD-10-CM | POA: Diagnosis not present

## 2024-02-01 DIAGNOSIS — Y92098 Other place in other non-institutional residence as the place of occurrence of the external cause: Secondary | ICD-10-CM

## 2024-02-01 DIAGNOSIS — I2489 Other forms of acute ischemic heart disease: Secondary | ICD-10-CM | POA: Diagnosis present

## 2024-02-01 DIAGNOSIS — F05 Delirium due to known physiological condition: Secondary | ICD-10-CM | POA: Diagnosis not present

## 2024-02-01 DIAGNOSIS — Z880 Allergy status to penicillin: Secondary | ICD-10-CM

## 2024-02-01 DIAGNOSIS — E538 Deficiency of other specified B group vitamins: Secondary | ICD-10-CM | POA: Diagnosis present

## 2024-02-01 DIAGNOSIS — S0012XA Contusion of left eyelid and periocular area, initial encounter: Secondary | ICD-10-CM | POA: Diagnosis present

## 2024-02-01 DIAGNOSIS — E870 Hyperosmolality and hypernatremia: Secondary | ICD-10-CM | POA: Diagnosis not present

## 2024-02-01 DIAGNOSIS — Z886 Allergy status to analgesic agent status: Secondary | ICD-10-CM

## 2024-02-01 DIAGNOSIS — Z781 Physical restraint status: Secondary | ICD-10-CM

## 2024-02-01 HISTORY — DX: Unspecified injury of head, initial encounter: S09.90XA

## 2024-02-01 HISTORY — DX: Acute kidney failure, unspecified: N17.9

## 2024-02-01 HISTORY — DX: Pneumonia, unspecified organism: J18.9

## 2024-02-01 LAB — CBC WITH DIFFERENTIAL/PLATELET
Abs Immature Granulocytes: 0.12 10*3/uL — ABNORMAL HIGH (ref 0.00–0.07)
Basophils Absolute: 0 10*3/uL (ref 0.0–0.1)
Basophils Relative: 0 %
Eosinophils Absolute: 0 10*3/uL (ref 0.0–0.5)
Eosinophils Relative: 0 %
HCT: 41.5 % (ref 36.0–46.0)
Hemoglobin: 13.9 g/dL (ref 12.0–15.0)
Immature Granulocytes: 1 %
Lymphocytes Relative: 5 %
Lymphs Abs: 0.5 10*3/uL — ABNORMAL LOW (ref 0.7–4.0)
MCH: 31 pg (ref 26.0–34.0)
MCHC: 33.5 g/dL (ref 30.0–36.0)
MCV: 92.6 fL (ref 80.0–100.0)
Monocytes Absolute: 0.7 10*3/uL (ref 0.1–1.0)
Monocytes Relative: 8 %
Neutro Abs: 7.5 10*3/uL (ref 1.7–7.7)
Neutrophils Relative %: 86 %
Platelets: 115 10*3/uL — ABNORMAL LOW (ref 150–400)
RBC: 4.48 MIL/uL (ref 3.87–5.11)
RDW: 14.7 % (ref 11.5–15.5)
WBC: 8.8 10*3/uL (ref 4.0–10.5)
nRBC: 0 % (ref 0.0–0.2)

## 2024-02-01 LAB — COMPREHENSIVE METABOLIC PANEL
ALT: 26 U/L (ref 0–44)
AST: 65 U/L — ABNORMAL HIGH (ref 15–41)
Albumin: 3.1 g/dL — ABNORMAL LOW (ref 3.5–5.0)
Alkaline Phosphatase: 30 U/L — ABNORMAL LOW (ref 38–126)
Anion gap: 13 (ref 5–15)
BUN: 71 mg/dL — ABNORMAL HIGH (ref 8–23)
CO2: 30 mmol/L (ref 22–32)
Calcium: 9.2 mg/dL (ref 8.9–10.3)
Chloride: 89 mmol/L — ABNORMAL LOW (ref 98–111)
Creatinine, Ser: 1.19 mg/dL — ABNORMAL HIGH (ref 0.44–1.00)
GFR, Estimated: 51 mL/min — ABNORMAL LOW (ref >60)
Glucose, Bld: 137 mg/dL — ABNORMAL HIGH (ref 70–99)
Potassium: 3.5 mmol/L (ref 3.5–5.1)
Sodium: 132 mmol/L — ABNORMAL LOW (ref 135–145)
Total Bilirubin: 0.6 mg/dL (ref 0.0–1.2)
Total Protein: 6.4 g/dL — ABNORMAL LOW (ref 6.5–8.1)

## 2024-02-01 LAB — LACTIC ACID, PLASMA
Lactic Acid, Venous: 1 mmol/L (ref 0.5–1.9)
Lactic Acid, Venous: 0.8 mmol/L (ref 0.5–1.9)

## 2024-02-01 LAB — BLOOD GAS, VENOUS
Acid-Base Excess: 9.3 mmol/L — ABNORMAL HIGH (ref 0.0–2.0)
Bicarbonate: 35.7 mmol/L — ABNORMAL HIGH (ref 20.0–28.0)
O2 Saturation: 82.6 %
Patient temperature: 37
pCO2, Ven: 55 mm[Hg] (ref 44–60)
pH, Ven: 7.42 (ref 7.25–7.43)
pO2, Ven: 48 mm[Hg] — ABNORMAL HIGH (ref 32–45)

## 2024-02-01 LAB — RESP PANEL BY RT-PCR (RSV, FLU A&B, COVID)  RVPGX2
Influenza A by PCR: NEGATIVE
Influenza B by PCR: NEGATIVE
Resp Syncytial Virus by PCR: NEGATIVE
SARS Coronavirus 2 by RT PCR: NEGATIVE

## 2024-02-01 LAB — APTT: aPTT: 28 s (ref 24–36)

## 2024-02-01 LAB — PROTIME-INR
INR: 1 (ref 0.8–1.2)
Prothrombin Time: 13.7 s (ref 11.4–15.2)

## 2024-02-01 MED ORDER — BUDESONIDE 0.5 MG/2ML IN SUSP: 0.5000 mg | Freq: Two times a day (BID) | RESPIRATORY_TRACT | Status: DC

## 2024-02-01 MED ORDER — GUAIFENESIN 100 MG/5ML PO LIQD: 5.0000 mL | Freq: Four times a day (QID) | ORAL | Status: AC | PRN

## 2024-02-01 MED ORDER — SODIUM CHLORIDE 0.9 % IV SOLN: INTRAVENOUS | Status: AC

## 2024-02-01 MED ORDER — HYDROCOD POLI-CHLORPHE POLI ER 10-8 MG/5ML PO SUER: 5.0000 mL | Freq: Every evening | ORAL | Status: AC | PRN

## 2024-02-01 MED ORDER — VITAMIN B-12 1000 MCG PO TABS
1000.0000 ug | ORAL_TABLET | Freq: Every day | ORAL | Status: DC
  Administered 2024-02-02 – 2024-02-07 (×5): 1000 ug via ORAL
  Filled 2024-02-01 (×4): qty 1
  Filled 2024-02-01: qty 2
  Filled 2024-02-01: qty 1
  Filled 2024-02-01: qty 2

## 2024-02-01 MED ORDER — SODIUM CHLORIDE 0.9 % IV SOLN
500.0000 mg | Freq: Once | INTRAVENOUS | Status: AC
  Administered 2024-02-01: 500 mg via INTRAVENOUS
  Filled 2024-02-01: qty 5

## 2024-02-01 MED ORDER — ACETAMINOPHEN 325 MG PO TABS
650.0000 mg | ORAL_TABLET | Freq: Four times a day (QID) | ORAL | Status: AC | PRN
  Administered 2024-02-01 – 2024-02-05 (×2): 650 mg via ORAL
  Filled 2024-02-01 (×2): qty 2

## 2024-02-01 MED ORDER — HYDRALAZINE HCL 20 MG/ML IJ SOLN
5.0000 mg | Freq: Four times a day (QID) | INTRAMUSCULAR | Status: AC | PRN
Start: 2024-02-01 — End: 2024-02-06
  Administered 2024-02-03 – 2024-02-05 (×2): 5 mg via INTRAVENOUS
  Filled 2024-02-01 (×2): qty 1

## 2024-02-01 MED ORDER — SENNOSIDES-DOCUSATE SODIUM 8.6-50 MG PO TABS: 1.0000 | ORAL_TABLET | Freq: Every evening | ORAL | Status: DC | PRN

## 2024-02-01 MED ORDER — SODIUM CHLORIDE 0.9 % IV BOLUS (SEPSIS)
1000.0000 mL | Freq: Once | INTRAVENOUS | Status: AC
  Administered 2024-02-01: 1000 mL via INTRAVENOUS

## 2024-02-01 MED ORDER — HEPARIN SODIUM (PORCINE) 5000 UNIT/ML IJ SOLN
5000.0000 [IU] | Freq: Three times a day (TID) | INTRAMUSCULAR | Status: DC
  Administered 2024-02-02: 5000 [IU] via SUBCUTANEOUS
  Filled 2024-02-01: qty 1

## 2024-02-01 MED ORDER — ONDANSETRON HCL 4 MG PO TABS
4.0000 mg | ORAL_TABLET | Freq: Four times a day (QID) | ORAL | Status: AC | PRN
  Filled 2024-02-01: qty 1

## 2024-02-01 MED ORDER — IPRATROPIUM-ALBUTEROL 0.5-2.5 (3) MG/3ML IN SOLN
3.0000 mL | Freq: Once | RESPIRATORY_TRACT | Status: AC
  Administered 2024-02-01: 3 mL via RESPIRATORY_TRACT
  Filled 2024-02-01: qty 3

## 2024-02-01 MED ORDER — ACETAMINOPHEN 650 MG RE SUPP: 650.0000 mg | Freq: Four times a day (QID) | RECTAL | Status: AC | PRN

## 2024-02-01 MED ORDER — SODIUM CHLORIDE 0.9 % IV SOLN
2.0000 g | INTRAVENOUS | Status: AC
  Administered 2024-02-02 – 2024-02-05 (×4): 2 g via INTRAVENOUS
  Filled 2024-02-01 (×4): qty 20

## 2024-02-01 MED ORDER — IPRATROPIUM-ALBUTEROL 0.5-2.5 (3) MG/3ML IN SOLN
3.0000 mL | Freq: Four times a day (QID) | RESPIRATORY_TRACT | Status: AC
  Administered 2024-02-01 – 2024-02-02 (×3): 3 mL via RESPIRATORY_TRACT
  Filled 2024-02-01 (×3): qty 3

## 2024-02-01 MED ORDER — ATORVASTATIN CALCIUM 20 MG PO TABS
40.0000 mg | ORAL_TABLET | Freq: Every day | ORAL | Status: DC
  Administered 2024-02-02 – 2024-02-08 (×6): 40 mg via ORAL
  Filled 2024-02-01 (×6): qty 2

## 2024-02-01 MED ORDER — SODIUM CHLORIDE 0.9 % IV SOLN
2.0000 g | Freq: Once | INTRAVENOUS | Status: AC
Start: 2024-02-01 — End: 2024-02-01
  Administered 2024-02-01: 2 g via INTRAVENOUS
  Filled 2024-02-01: qty 20

## 2024-02-01 MED ORDER — ALBUTEROL SULFATE (2.5 MG/3ML) 0.083% IN NEBU
2.5000 mg | INHALATION_SOLUTION | Freq: Four times a day (QID) | RESPIRATORY_TRACT | Status: DC | PRN
  Administered 2024-02-05: 2.5 mg via RESPIRATORY_TRACT
  Filled 2024-02-01: qty 3

## 2024-02-01 MED ORDER — SODIUM CHLORIDE 0.9 % IV SOLN
500.0000 mg | INTRAVENOUS | Status: DC
  Administered 2024-02-02: 500 mg via INTRAVENOUS
  Filled 2024-02-01: qty 5

## 2024-02-01 MED ORDER — ONDANSETRON HCL 4 MG/2ML IJ SOLN: 4.0000 mg | Freq: Four times a day (QID) | INTRAMUSCULAR | Status: AC | PRN

## 2024-02-01 MED ORDER — FLUTICASONE FUROATE 100 MCG/ACT IN AEPB: 1.0000 | INHALATION_SPRAY | Freq: Every day | RESPIRATORY_TRACT | Status: DC

## 2024-02-01 NOTE — Assessment & Plan Note (Signed)
 Worsened shortness of breath today with cough and low grade temp Hypoxia pulse ox 66% RA, improves with 02 Reviewed pcp notes/plan  Reviewed most recent labs  Per family pt lost her anuity inhaler a while ago  Distant bs on exam/ mild wheezing  EMS called for transport to hospital (remained stable with 02)

## 2024-02-01 NOTE — ED Provider Notes (Signed)
 [facility] Provider Note    Event Date/Time   First MD Initiated Contact with Patient 02/01/24 1205     (approximate)   History   Respiratory Distress   HPI Michelle Barnett is a 63 y.o. female with history of COPD, GERD, HTN, iron deficiency anemia presenting today for shortness of breath.  Patient was seeing her primary care provider in the clinic when she was found to be hypoxic at 66% on room air.  Improved on 2 to 3 L up to 88%.  EMS was called to transport her to the hospital.  Patient notes cough and congestion recently.  Has run out of her breathing treatments for COPD at home.  Otherwise denies fever, chest pain, nausea, vomiting, abdominal pain, diarrhea.  Separately, patient also had a fall 2 days ago while going down her stairs.  Clemens and hit the front side of her face.  Did lose consciousness.  Did not get evaluated after this injury.     Physical Exam   Triage Vital Signs: ED Triage Vitals  Encounter Vitals Group     BP --      Systolic BP Percentile --      Diastolic BP Percentile --      Pulse --      Resp --      Temp --      Temp src --      SpO2 02/01/24 1211 95 %     Weight 02/01/24 1212 127 lb (57.6 kg)     Height 02/01/24 1212 4' 9 (1.448 m)     Head Circumference --      Peak Flow --      Pain Score 02/01/24 1212 0     Pain Loc --      Pain Education --      Exclude from Growth Chart --     Most recent vital signs: Vitals:   02/01/24 1300 02/01/24 1330  BP: 97/86 (!) 119/54  Pulse: 95 92  Resp: 20 15  Temp:    SpO2: 96% 100%   Physical Exam: I have reviewed the vital signs and nursing notes. General: Awake, alert, no acute distress.  Nontoxic appearing. Head: Hematoma with bruising to the left eyebrow and left cheek, dried blood on the lips, normocephalic.   ENT:  EOM intact, PERRL. Oral mucosa is pink and moist with no lesions. Neck: Neck is supple with full range of motion, No meningeal signs. Cardiovascular:   RRR, No murmurs. Peripheral pulses palpable and equal bilaterally. Respiratory:  Symmetrical chest wall expansion.  No rhonchi, rales, or wheezes.  Good air movement throughout.  No use of accessory muscles.   Musculoskeletal:  No cyanosis or edema. Moving extremities with full ROM Abdomen:  Soft, nontender, nondistended. Neuro:  GCS 15, moving all four extremities, interacting appropriately. Speech clear. Psych:  Calm, appropriate.   Skin:  Warm, dry, no rash.    ED Results / Procedures / Treatments   Labs (all labs ordered are listed, but only abnormal results are displayed) Labs Reviewed  COMPREHENSIVE METABOLIC PANEL - Abnormal; Notable for the following components:      Result Value   Sodium 132 (*)    Chloride 89 (*)    Glucose, Bld 137 (*)    BUN 71 (*)    Creatinine, Ser 1.19 (*)    Total Protein 6.4 (*)    Albumin 3.1 (*)    AST 65 (*)    Alkaline  Phosphatase 30 (*)    GFR, Estimated 51 (*)    All other components within normal limits  CBC WITH DIFFERENTIAL/PLATELET - Abnormal; Notable for the following components:   Platelets 115 (*)    Lymphs Abs 0.5 (*)    Abs Immature Granulocytes 0.12 (*)    All other components within normal limits  BLOOD GAS, VENOUS - Abnormal; Notable for the following components:   pO2, Ven 48 (*)    Bicarbonate 35.7 (*)    Acid-Base Excess 9.3 (*)    All other components within normal limits  RESP PANEL BY RT-PCR (RSV, FLU A&B, COVID)  RVPGX2  CULTURE, BLOOD (ROUTINE X 2)  CULTURE, BLOOD (ROUTINE X 2)  LACTIC ACID, PLASMA  PROTIME-INR  APTT  LACTIC ACID, PLASMA     EKG    RADIOLOGY Independently interpreted chest x-ray with evidence of right lower lobe pneumonia   PROCEDURES:  Critical Care performed: Yes, see critical care procedure note(s)  .Critical Care  Performed by: Malvina Alm DASEN, MD Authorized by: Malvina Alm DASEN, MD   Critical care provider statement:    Critical care time (minutes):  30   Critical care was  necessary to treat or prevent imminent or life-threatening deterioration of the following conditions:  Respiratory failure and sepsis   Critical care was time spent personally by me on the following activities:  Development of treatment plan with patient or surrogate, discussions with consultants, evaluation of patient's response to treatment, examination of patient, ordering and review of laboratory studies, ordering and review of radiographic studies, ordering and performing treatments and interventions, pulse oximetry, re-evaluation of patient's condition and review of old charts   I assumed direction of critical care for this patient from another provider in my specialty: no     Care discussed with: admitting provider      MEDICATIONS ORDERED IN ED: Medications  sodium chloride  0.9 % bolus 1,000 mL (1,000 mLs Intravenous New Bag/Given 02/01/24 1246)  azithromycin  (ZITHROMAX ) 500 mg in sodium chloride  0.9 % 250 mL IVPB (500 mg Intravenous New Bag/Given 02/01/24 1309)  ipratropium-albuterol  (DUONEB) 0.5-2.5 (3) MG/3ML nebulizer solution 3 mL (3 mLs Nebulization Given 02/01/24 1246)  cefTRIAXone  (ROCEPHIN ) 2 g in sodium chloride  0.9 % 100 mL IVPB (0 g Intravenous Stopped 02/01/24 1309)     IMPRESSION / MDM / ASSESSMENT AND PLAN / ED COURSE  I reviewed the triage vital signs and the nursing notes.                              Differential diagnosis includes, but is not limited to, pneumonia, COVID, flu, RSV, COPD exacerbation, pneumothorax  Patient's presentation is most consistent with acute presentation with potential threat to life or bodily function.  Patient is a 63 year old female presenting today for acute hypoxic respiratory failure in the setting of COPD.  Reportedly had cough and congestion recently with shortness of breath.  Was at her PCP office when she was found to be hypoxic around 80 on her baseline room air.  Required 2 to 3 L to maintain greater than 88%.  Was placed on  nonrebreather with EMS.  On arrival here transition back to 4 L and saturating well.  Chest x-ray shows evidence of right lower lobe pneumonia.  Patient was meeting sepsis criteria based on her heart rate and respiratory rate on arrival and started on community-acquired pneumonia antibiotic treatment.  Was also given a DuoNeb.  Other laboratory  workup largely reassuring.  Will admit to hospitalist for ongoing care of her pneumonia.  The patient is on the cardiac monitor to evaluate for evidence of arrhythmia and/or significant heart rate changes. Clinical Course as of 02/01/24 1341  Tue Feb 01, 2024  1304 DG Chest [facility] 1 View Right-sided infiltrates to the chest indicating pneumonia [DW]    Clinical Course User Index [DW] Malvina Alm DASEN, MD     FINAL CLINICAL IMPRESSION(S) / ED DIAGNOSES   Final diagnoses:  Acute hypoxic respiratory failure (HCC)  Pneumonia of right lower lobe due to infectious organism  Sepsis, due to unspecified organism, unspecified whether acute organ dysfunction present [facility])     Rx / DC Orders   ED Discharge Orders     None        Note:  This document was prepared using Dragon voice recognition software and may include unintentional dictation errors.   Malvina Alm DASEN, MD 02/01/24 1344

## 2024-02-01 NOTE — ED Notes (Signed)
Called RT to notify of VBG sent to the lab

## 2024-02-01 NOTE — Assessment & Plan Note (Signed)
In the left side of forehead Present on admission secondary to falling at home

## 2024-02-01 NOTE — Assessment & Plan Note (Signed)
 Acute on chronic -this weekend  Followed by shortness of breath  Clemens and injured head Sunday  States she lost her anuity inhaler   Today low grade temp at 100.7  Hypoxic- pulse ox 66% RA    Ems called to transfer to hospital for shortness of breath/cough/fever and head injury from fall

## 2024-02-01 NOTE — Assessment & Plan Note (Addendum)
Status duo-neb nebulization per EDP.  Duoneb QID, 4 doses scheduled we ordered on admisison Albuterol nebulizer q6h prn for wheezing, shortness of breath resumed

## 2024-02-01 NOTE — Consult Note (Signed)
 CODE SEPSIS - PHARMACY COMMUNICATION  **Broad-spectrum antimicrobials should be administered within one hour of sepsis diagnosis**  Time Code Sepsis call or page was received: 1213  Antibiotics ordered: Ceftriaxone , Azithromycin   Time of first antibiotic administration: 1247  Additional action taken by pharmacy: N/A  If necessary, name of provider/nurse contacted: N/A    Will M. Lenon, PharmD Clinical Pharmacist 02/01/2024 12:22 PM

## 2024-02-01 NOTE — Sepsis Progress Note (Signed)
 Sepsis protocol monitored by eLink ?

## 2024-02-01 NOTE — ED Notes (Signed)
Pt cleaned up and placed on bedpan. Pt urinated and brief placed.

## 2024-02-01 NOTE — Assessment & Plan Note (Addendum)
Secondary to right middle lobe pneumonia Continue oxygen supplementation to maintain SpO2 greater than 92% Continuous pulse oximetry ordered on admission

## 2024-02-01 NOTE — Assessment & Plan Note (Signed)
Left temple/forehead- bruised / lips abraded   Followed by confusion/some hallucinations but pt denied headache or dizziness or nausea   Per caregiver improved today   Is hypoxic/possible pna - transport to hospital via EMS for further eval

## 2024-02-01 NOTE — Assessment & Plan Note (Signed)
In setting of cough and copd (ran out of her anuity ellipta) Pulse ox 66% on RA Went up to 88% on 2-3 liters here with improved breathing effort  Has acute on chronic cough- then a fall on Sunday   Called EMS for transport to hospital

## 2024-02-01 NOTE — Assessment & Plan Note (Signed)
Home lisinopril-hydrochlorothiazide 20-25 mg were not resumed on admission in setting of AKI AM team to resume when the benefits outweigh the risk Hydralazine 5 mg every 6 hours as needed for SBP > 170, 5 days ordered

## 2024-02-01 NOTE — Assessment & Plan Note (Signed)
Community-acquired pneumonia Continue with azithromycin 2 g IV daily, azithromycin 500 mg IV daily, to complete 5-day course Flutter valve, incentive spirometry Continue oxygen supplementation to maintain SpO2 greater than 92% Continuous pulse oximetry

## 2024-02-01 NOTE — Assessment & Plan Note (Signed)
Atorvastatin 40 mg at bedtime resumed on admission

## 2024-02-01 NOTE — ED Notes (Signed)
Assisted patient with bedpan at this time.

## 2024-02-01 NOTE — ED Notes (Signed)
 Pt gone to CT

## 2024-02-01 NOTE — ED Notes (Signed)
 Called [facility] for cardiac monitoring

## 2024-02-01 NOTE — ED Notes (Signed)
Pt resting in bed at this time. Bed alarm on. Fall risk bracelet in place

## 2024-02-01 NOTE — Progress Notes (Signed)
 Subjective:    Patient ID: Michelle Barnett, female    DOB: 06/10/1961, 63 y.o.   MRN: [id number]  HPI  Wt Readings from Last 3 Encounters:  09/24/23 122 lb (55.3 kg)  09/09/23 131 lb (59.4 kg)  05/27/23 122 lb (55.3 kg)   26.40 kg/m  Vitals:   02/01/24 1055  BP: 110/62  Pulse: (!) 113  Temp: (!) 100.7 F (38.2 C)  SpO2: (!) 66%    63 yo pt of NP Clark presents for follow up of a fall Sunday night  Facial swelling   Cognitive changes since fall   Fell Sunday  Hit head outside on pc of wood   Since then some confusion   Hallucinating during night/in sleep? -talking to dead family members Fiddling with things -fingers  Felt miserable   No blood thinners  Goose egg near left eye  Abraded lips-healing now   Denied headache but had a sore head  No dizziness  No n/v    Has copd with chronic cough Uses anuity ellipta  Tesalon   Ran out of her inhaler  Unsure which one she ran out of   Cough worse lately (acute on chronic)  Not productive  No wheezing / can hear rattling when lying down  Wondered if she had pneumonia  May have started before the fall         Lab Results  Component Value Date   WBC 4.4 05/21/2023   HGB 14.2 05/21/2023   HCT 44.1 05/21/2023   MCV 95.9 05/21/2023   PLT 183.0 05/21/2023   Lab Results  Component Value Date   NA 140 05/21/2023   K 4.0 05/21/2023   CO2 38 (H) 05/21/2023   GLUCOSE 87 05/21/2023   BUN 13 05/21/2023   CREATININE 0.57 05/21/2023   CALCIUM  9.2 05/21/2023   GFR 96.90 05/21/2023   GFRNONAA 67 09/28/2016       Patient Active Problem List   Diagnosis Date Noted   Head injury 02/01/2024   Hypoxia 02/01/2024   Rash and nonspecific skin eruption 09/09/2023   Bilateral lower extremity edema 09/09/2023   Hyperlipidemia 05/19/2022   Acute cough 05/19/2022   Murmur 05/21/2021   GERD (gastroesophageal reflux disease) 11/08/2017   Mild intermittent asthma without complication 11/08/2017   Demand  ischemia (HCC) 09/20/2016   Bicuspid aortic valve 09/20/2016   Preventative health care 06/15/2016   Obesity 01/27/2016   Anemia, iron deficiency 07/30/2014   Essential hypertension 07/30/2014   COPD, severity to be determined (HCC) 07/30/2014   Vitamin B12 deficiency 07/30/2014   Adjustment disorder with mixed anxiety and depressed mood 07/30/2014   Past Medical History:  Diagnosis Date   Asthma    Bicuspid aortic valve 09/20/2016   a. echo 09/19/16: EF 55-60%, GR1DD, possible bicuspid aortic valve without evidence of AS   Bronchitis    Coronary artery disease, non-occlusive    a. cath 09/21/16: ostLM to LM 20%, no evidence of aortic stenosis   Demand ischemia (HCC) 09/20/2016   Heart murmur    Hiatal hernia    History of blood transfusion    Hypertension    Persistent cough for 3 weeks or longer 05/19/2022   Ulcer    Past Surgical History:  Procedure Laterality Date   abdominal tumor     ABLATION     CARDIAC CATHETERIZATION N/A 09/21/2016   Procedure: Left Heart Cath and Coronary Angiography;  Surgeon: Deatrice DELENA Cage, MD;  Location: [facility] INVASIVE CV LAB;  Service: Cardiovascular;  Laterality: N/A;   COLONOSCOPY N/A 08/14/2016   Procedure: COLONOSCOPY;  Surgeon: Victory LITTIE Legrand DOUGLAS, MD;  Location: [facility] ENDOSCOPY;  Service: Endoscopy;  Laterality: N/A;   ESOPHAGOGASTRODUODENOSCOPY N/A 08/13/2016   Procedure: ESOPHAGOGASTRODUODENOSCOPY (EGD);  Surgeon: Victory LITTIE Legrand DOUGLAS, MD;  Location: [facility] ENDOSCOPY;  Service: Gastroenterology;  Laterality: N/A;   TOOTH EXTRACTION     Social History   Tobacco Use   Smoking status: Never   Smokeless tobacco: Never  Substance Use Topics   Alcohol use: No   Drug use: No   Family History  Problem Relation Age of Onset   Stroke Mother    Hypertension Mother    Hypertension Father    Heart disease Father    Hypertension Brother    Breast cancer Maternal Aunt    Allergies  Allergen Reactions   Amlodipine  Rash   Aspirin Anaphylaxis   Dairy  Aid [Tilactase] Swelling and Other (See Comments)    Any dairy products   Benadryl [Diphenhydramine] Palpitations   Penicillins Other (See Comments)    Reaction: Unknown   Current Outpatient Medications on File Prior to Visit  Medication Sig Dispense Refill   albuterol  (VENTOLIN  HFA) 108 (90 Base) MCG/ACT inhaler INHALE 2 PUFFS BY MOUTH EVERY 6 HOURS AS NEEDED FOR WHEEZING FOR SHORTNESS OF BREATH 9 g 0   Ascorbic Acid  (VITAMIN C ) 1000 MG tablet Take 1,000 mg by mouth 2 (two) times daily.      atorvastatin  (LIPITOR) 40 MG tablet TAKE 1 TABLET BY MOUTH ONCE DAILY FOR CHOLESTEROL 90 tablet 3   benzonatate  (TESSALON ) 200 MG capsule Take 1 capsule (200 mg total) by mouth 3 (three) times daily as needed for cough. 15 capsule 0   ferrous sulfate  325 (65 FE) MG tablet Take 650 mg by mouth 2 (two) times daily.     Fluticasone  Furoate (ARNUITY ELLIPTA ) 100 MCG/ACT AEPB Inhale 1 puff into the lungs daily. 90 each 3   lisinopril -hydrochlorothiazide  (ZESTORETIC ) 20-25 MG tablet Take 1 tablet by mouth once daily for blood pressure 90 tablet 0   magnesium  oxide (MAG-OX) 400 MG tablet Take 400 mg by mouth daily.     Multiple Vitamins-Calcium  (ONE-A-DAY WOMENS PO) Take 1 tablet by mouth daily.     Omega-3 Fatty Acids (FISH OIL) 1200 MG CAPS Take 1 capsule by mouth daily.      vitamin B-12 (CYANOCOBALAMIN ) 1000 MCG tablet Take 1,000 mcg by mouth daily.     No current facility-administered medications on file prior to visit.    Review of Systems  Constitutional:  Positive for activity change, fatigue and fever. Negative for appetite change and unexpected weight change.  HENT:  Positive for congestion. Negative for ear pain, rhinorrhea, sinus pressure and sore throat.        Head and face injury and bruising - left forehead Also lips  Eyes:  Negative for pain, redness and visual disturbance.  Respiratory:  Positive for cough, chest tightness and shortness of breath. Negative for wheezing and stridor.    Cardiovascular:  Positive for leg swelling. Negative for chest pain and palpitations.  Gastrointestinal:  Negative for abdominal pain, blood in stool, constipation and diarrhea.  Endocrine: Negative for polydipsia and polyuria.  Genitourinary:  Negative for dysuria, frequency and urgency.  Musculoskeletal:  Negative for arthralgias, back pain and myalgias.  Skin:  Negative for pallor and rash.  Allergic/Immunologic: Negative for environmental allergies.  Neurological:  Negative for dizziness, syncope, facial asymmetry and headaches.  Hematological:  Negative  for adenopathy. Does not bruise/bleed easily.  Psychiatric/Behavioral:  Positive for confusion, decreased concentration and sleep disturbance. Negative for dysphoric mood. The patient is not nervous/anxious.        Objective:   Physical Exam Constitutional:      General: She is not in acute distress.    Appearance: Normal appearance. She is ill-appearing. She is not diaphoretic.     Comments: In wheelchair  Breathing fast / fatigued    HENT:     Head: Normocephalic.     Comments: Bruising over left eye with swelling  Ecchymosis under eye as well  Some tenderness   Crusted blood on lips with abrasion       Nose: No congestion.     Mouth/Throat:     Comments: MM are slightly dry   Dried blood around lips -dry appearing  Eyes:     General:        Right eye: No discharge.        Left eye: No discharge.     Conjunctiva/sclera: Conjunctivae normal.     Pupils: Pupils are equal, round, and reactive to light.  Cardiovascular:     Rate and Rhythm: Tachycardia present.     Heart sounds: Murmur heard.  Pulmonary:     Effort: Respiratory distress present.     Breath sounds: Rhonchi present.     Comments: Diffusely distant bs Mildly prolonged exp phase  Rapid breathing -improves with 02 mask  Chest:     Chest wall: No tenderness.  Musculoskeletal:     Cervical back: No rigidity.     Right lower leg: No edema.      Left lower leg: No edema.  Skin:    Coloration: Skin is not jaundiced.     Findings: Bruising present.     Comments: Fair complexion   Neurological:     Mental Status: She is alert.     Comments: Generalized weakness  Psychiatric:     Comments: Pleasant Talks in one to two word sentences due to shortness of breath    States she feels better with oxygen   No agitation              Assessment & Plan:   Problem List Items Addressed This Visit       Respiratory   Hypoxia - Primary   In setting of cough and copd (ran out of her anuity ellipta) Pulse ox 66% on RA Went up to 88% on 2-3 liters here with improved breathing effort  Has acute on chronic cough- then a fall on Sunday   Called EMS for transport to hospital      COPD, severity to be determined (HCC)   Worsened shortness of breath today with cough and low grade temp Hypoxia pulse ox 66% RA, improves with 02 Reviewed pcp notes/plan  Reviewed most recent labs  Per family pt lost her anuity inhaler a while ago  Distant bs on exam/ mild wheezing  EMS called for transport to hospital (remained stable with 02)           Other   Head injury   Left temple/forehead- bruised / lips abraded   Followed by confusion/some hallucinations but pt denied headache or dizziness or nausea   Per caregiver improved today   Is hypoxic/possible pna - transport to hospital via EMS for further eval       Acute cough   Acute on chronic -this weekend  Followed by shortness of breath  [place]  and injured head Sunday  States she lost her anuity inhaler   Today low grade temp at 100.7  Hypoxic- pulse ox 66% RA    Ems called to transfer to hospital for shortness of breath/cough/fever and head injury from fall

## 2024-02-01 NOTE — Hospital Course (Addendum)
 [name] is a 63 year old female with history of hypertension, hyperlipidemia, iron deficiency anemia, who presents emergency department for chief concerns of acute hypoxia PCPs clinic.  Vitals in the ED showed temperature of 100.7, improved to 98.1, respiration rate of 20, improved to 15, heart rate initially 113 and improved to 92, blood pressure 110/62, SpO2 initially at PCP office was noted to be 66% on room air, improved to 100% on 5 L nasal cannula.  Serum Na 132, K 3.5, chloride 89, bicarb 30, BUN of 71, sCr 1.19, EGFR 51, nonfasting blood glucose 137, WBC 8.8, hemoglobin 13.9, platelets 115.  Lactic acid is 1.0.  VBG: 7.4 2/55/48  COVID/influenza A/influenza B/RSV PCR were negative.  Imagings in ED: CT head wo contrast; CT cervical spine wo contrast; CT maxillofacial wo contrast were read as: no CT evidence of intracranial injury.  No acute facial bone fracture.  No acute fracture or traumatic subluxation of cervical spine.  Soft tissue swelling in the periorbital soft tissues on the left and along the left frontal scalp.  ED treatment: DuoNebs one-time treatment, azithromycin  500 mg IV one-time dose, ceftriaxone  2 g IV one-time dose, sodium chloride  1 L bolus.

## 2024-02-01 NOTE — Assessment & Plan Note (Addendum)
 Presumed secondary to prerenal in setting of poor p.o. intake On no prior CKD Baseline serum creatinine is 0.57-0.65/eGFR of 96.8 Status post sodium chloride  1 L bolus per EDP On admission I ordered sodium chloride  infusion at 100 mL/h, 1 day ordered Recheck BMP in the a.m.

## 2024-02-01 NOTE — H&P (Signed)
 History and Physical   Michelle Barnett FMW:[id number] DOB: 03/28/1961 DOA: 02/01/2024  PCP: Gretta Comer POUR, NP  Patient coming from: PCP via EMS  I have personally briefly reviewed patient's old medical records in [facility] EMR.  Chief Concern: Fall on Sunday; hypoxia  HPI: [name] is a 63 year old female with history of hypertension, hyperlipidemia, iron deficiency anemia, who presents emergency department for chief concerns of acute hypoxia PCPs clinic.  Vitals in the ED showed temperature of 100.7, improved to 98.1, respiration rate of 20, improved to 15, heart rate initially 113 and improved to 92, blood pressure 110/62, SpO2 initially at PCP office was noted to be 66% on room air, improved to 100% on 5 L nasal cannula.  Serum Na 132, K 3.5, chloride 89, bicarb 30, BUN of 71, sCr 1.19, EGFR 51, nonfasting blood glucose 137, WBC 8.8, hemoglobin 13.9, platelets 115.  Lactic acid is 1.0.  VBG: 7.4 2/55/48  COVID/influenza A/influenza B/RSV PCR were negative.  Imagings in ED: CT head wo contrast; CT cervical spine wo contrast; CT maxillofacial wo contrast were read as: no CT evidence of intracranial injury.  No acute facial bone fracture.  No acute fracture or traumatic subluxation of cervical spine.  Soft tissue swelling in the periorbital soft tissues on the left and along the left frontal scalp.  ED treatment: DuoNebs one-time treatment, azithromycin  500 mg IV one-time dose, ceftriaxone  2 g IV one-time dose, sodium chloride  1 L bolus. --------------------------------- At bedside, patient is able to tell me her first and last name, age, location, current calendar year.  Patient reports that she fell on Sunday and fell on a piece of 2 x 4 plywood that was laying on the yard.  Patient hit her face including her lips and the left eye and forehead onto this piece of wood.  Patient denies loss of consciousness to me.  She endorses coughing with productive sputum but was not  able to tell me the color of the sputum.  She denies chest pain, shortness of breath, dysuria, hematuria, diarrhea, blood in her stool, nausea, vomiting.  Her boyfriend, at bedside states that he noted she has not been eating very well over the last 2 days.  Social history: She lives at home with her boyfriend.  She denies tobacco, EtOH, recreational drug use.  She currently works for at [employer] at [employer].  ROS: Constitutional: no weight change, no fever ENT/Mouth: no sore throat, no rhinorrhea Eyes: no eye pain, no vision changes Cardiovascular: no chest pain, + dyspnea,  no edema, no palpitations Respiratory: + cough, + sputum, no wheezing Gastrointestinal: no nausea, no vomiting, no diarrhea, no constipation Genitourinary: no urinary incontinence, no dysuria, no hematuria Musculoskeletal: no arthralgias, no myalgias Skin: no skin lesions, no pruritus, Neuro: + weakness, no loss of consciousness, no syncope Psych: no anxiety, no depression, + decrease appetite Heme/Lymph: no bruising, no bleeding  ED Course: Discussed with EDP, patient requiring hospitalization for chief concerns of right middle lobe pneumonia.  Assessment/Plan  Principal Problem:   Right middle lobe pneumonia Active Problems:   AKI (acute kidney injury) (HCC)   Essential hypertension   Vitamin B12 deficiency   Adjustment disorder with mixed anxiety and depressed mood   GERD (gastroesophageal reflux disease)   Mild intermittent asthma without complication   Hyperlipidemia   Hypoxia   Ecchymosis of left eye   Assessment and Plan:  * Right middle lobe pneumonia Community-acquired pneumonia Continue with azithromycin  2 g IV daily, azithromycin   500 mg IV daily, to complete 5-day course Flutter valve, incentive spirometry Continue oxygen supplementation to maintain SpO2 greater than 92% Continuous pulse oximetry  AKI (acute kidney injury) (HCC) Presumed secondary to prerenal in setting of poor  p.o. intake On no prior CKD Baseline serum creatinine is 0.57-0.65/eGFR of 96.8 Status post sodium chloride  1 L bolus per EDP On admission I ordered sodium chloride  infusion at 100 mL/h, 1 day ordered Recheck BMP in the a.m.  Ecchymosis of left eye In the left side of forehead Present on admission secondary to falling at home  Hypoxia Secondary to right middle lobe pneumonia Continue oxygen supplementation to maintain SpO2 greater than 92% Continuous pulse oximetry ordered on admission  Hyperlipidemia Atorvastatin  40 mg at bedtime resumed on admission  Mild intermittent asthma without complication Status duo-neb nebulization per EDP.  Duoneb QID, 4 doses scheduled we ordered on admisison Albuterol  nebulizer q6h prn for wheezing, shortness of breath resumed  Essential hypertension Home lisinopril -hydrochlorothiazide  20-25 mg were not resumed on admission in setting of AKI AM team to resume when the benefits outweigh the risk Hydralazine  5 mg every 6 hours as needed for SBP > 170, 5 days ordered  Chart reviewed.   DVT prophylaxis: Heparin  5000 units subcutaneous every 8 hours Code Status: Full code Diet: Heart healthy diet; encourage PO intake Family Communication: Updated boyfriend, Mr. Kimberlee Mace, should be going walking and going outside breathing in the fresh at bedside with patient's permission Disposition Plan: Pending clinical course Consults called: None at this time Admission status: Telemetry medical, inpatient  Past Medical History:  Diagnosis Date   Asthma    Bicuspid aortic valve 09/20/2016   a. echo 09/19/16: EF 55-60%, GR1DD, possible bicuspid aortic valve without evidence of AS   Bronchitis    Coronary artery disease, non-occlusive    a. cath 09/21/16: ostLM to LM 20%, no evidence of aortic stenosis   Demand ischemia (HCC) 09/20/2016   Heart murmur    Hiatal hernia    History of blood transfusion    Hypertension    Persistent cough for 3 weeks or  longer 05/19/2022   Ulcer    Past Surgical History:  Procedure Laterality Date   abdominal tumor     ABLATION     CARDIAC CATHETERIZATION N/A 09/21/2016   Procedure: Left Heart Cath and Coronary Angiography;  Surgeon: Deatrice DELENA Cage, MD;  Location: [facility] INVASIVE CV LAB;  Service: Cardiovascular;  Laterality: N/A;   COLONOSCOPY N/A 08/14/2016   Procedure: COLONOSCOPY;  Surgeon: Victory LITTIE Legrand DOUGLAS, MD;  Location[facility] ENDOSCOPY;  Service: Endoscopy;  Laterality: N/A;   ESOPHAGOGASTRODUODENOSCOPY N/A 08/13/2016   Procedure: ESOPHAGOGASTRODUODENOSCOPY (EGD);  Surgeon: Victory LITTIE Legrand DOUGLAS, MD;  Location: [facility] ENDOSCOPY;  Service: Gastroenterology;  Laterality: N/A;   TOOTH EXTRACTION     Social History:  reports that she has never smoked. She has never used smokeless tobacco. She reports that she does not drink alcohol and does not use drugs.  Allergies  Allergen Reactions   Amlodipine  Rash   Aspirin Anaphylaxis   Dairy Aid [Tilactase] Swelling and Other (See Comments)    Any dairy products   Benadryl [Diphenhydramine] Palpitations   Penicillins Other (See Comments)    Reaction: Unknown   Family History  Problem Relation Age of Onset   Stroke Mother    Hypertension Mother    Hypertension Father    Heart disease Father    Hypertension Brother    Breast cancer Maternal Aunt    Family  history: Family history reviewed and not pertinent.  Prior to Admission medications   Medication Sig Start Date End Date Taking? Authorizing Provider  albuterol  (VENTOLIN  HFA) 108 (90 Base) MCG/ACT inhaler INHALE 2 PUFFS BY MOUTH EVERY 6 HOURS AS NEEDED FOR WHEEZING FOR SHORTNESS OF BREATH 11/28/23   Clark, Katherine K, NP  Ascorbic Acid  (VITAMIN C ) 1000 MG tablet Take 1,000 mg by mouth 2 (two) times daily.     [provider]  atorvastatin  (LIPITOR) 40 MG tablet TAKE 1 TABLET BY MOUTH ONCE DAILY FOR CHOLESTEROL 05/30/23   Clark, Katherine K, NP  benzonatate  (TESSALON ) 200 MG capsule Take 1 capsule (200  mg total) by mouth 3 (three) times daily as needed for cough. 09/24/23   Gretta Comer POUR, NP  ferrous sulfate  325 (65 FE) MG tablet Take 650 mg by mouth 2 (two) times daily.    [provider]  Fluticasone  Furoate (ARNUITY ELLIPTA ) 100 MCG/ACT AEPB Inhale 1 puff into the lungs daily. 05/21/23   Clark, Katherine K, NP  lisinopril -hydrochlorothiazide  (ZESTORETIC ) 20-25 MG tablet Take 1 tablet by mouth once daily for blood pressure 11/28/23   Clark, Katherine K, NP  magnesium  oxide (MAG-OX) 400 MG tablet Take 400 mg by mouth daily.    [provider]  Multiple Vitamins-Calcium  (ONE-A-DAY WOMENS PO) Take 1 tablet by mouth daily.    [provider]  Omega-3 Fatty Acids (FISH OIL) 1200 MG CAPS Take 1 capsule by mouth daily.     [provider]  vitamin B-12 (CYANOCOBALAMIN ) 1000 MCG tablet Take 1,000 mcg by mouth daily.    [provider]   Physical Exam: Vitals:   02/01/24 1212 02/01/24 1216 02/01/24 1300 02/01/24 1330  BP:  (!) 126/57 97/86 (!) 119/54  Pulse:  (!) 104 95 92  Resp:  (!) 25 20 15   Temp:  98.1 F (36.7 C)    TempSrc:  Oral    SpO2:  95% 96% 100%  Weight: 57.6 kg     Height: 4' 9 (1.448 m)      Constitutional: appears older than chronological age, frail Eyes: Right periorbital ecchymosis, PERRL HENMT: Mucous membranes are dry with cracking/peeling lips with dried blood. Posterior pharynx clear of any exudate or lesions. Age-appropriate dentition. Hearing appropriate. Right forehead ecchymosis with green/purple Neck: normal, supple, no masses, no thyromegaly Respiratory: clear to auscultation bilaterally, no wheezing, no crackles. Normal respiratory effort. No accessory muscle use.  Cardiovascular: Regular rate and rhythm, no murmurs / rubs / gallops. No extremity edema. 2+ pedal pulses. No carotid bruits.  Abdomen: no tenderness, no masses palpated, no hepatosplenomegaly. Bowel sounds positive.  Musculoskeletal: no clubbing /  cyanosis. No joint deformity upper and lower extremities. Good ROM, no contractures, no atrophy. Normal muscle tone.  Skin: no rashes, lesions, ulcers. No induration Neurologic: Sensation intact. Strength 5/5 in all 4.  Psychiatric: Normal judgment and insight. Alert and oriented x 3. Depressed mood.   EKG: independently reviewed, showing sinus tachycardia with rate of 96, QTc 524, right bundle branch block  Chest x-ray on Admission: I personally reviewed and I agree with radiologist reading as below.  DG Chest Port 1 View Result Date: 02/01/2024 CLINICAL DATA:  Questionable sepsis - evaluate for abnormality. Fall. EXAM: PORTABLE CHEST 1 VIEW COMPARISON:  09/24/2023. FINDINGS: There are heterogeneous opacities overlying the right mid lower lung zones without significant volume loss, concerning for pneumonia. There is subtle blunting of bilateral lateral costophrenic angles, which may represent trace pleural effusions. Elevated left hemidiaphragm noted.  There are probable atelectatic changes at the left lung base. No pneumothorax. Stable cardio-mediastinal silhouette. No acute osseous abnormalities. The soft tissues are within normal limits. IMPRESSION: *Heterogeneous opacities overlying the right mid lower lung zones without significant volume loss, concerning for pneumonia. Follow-up to clearing is recommended. *Subtle blunting of bilateral lateral costophrenic angles may represent trace pleural effusions. Electronically Signed   By: Ree Molt M.D.   On: 02/01/2024 13:11   CT Head Wo Contrast Result Date: 02/01/2024 CLINICAL DATA:  Ground-level fall with obvious head injury 2 days ago. Positive LOC. Ongoing headache.; Ground-level fall with facial injury. Hematoma and swelling over the left eyebrow and left cheek. Dried blood around the mouth; Ground-level fall with head neck trauma EXAM: CT HEAD WITHOUT CONTRAST CT MAXILLOFACIAL WITHOUT CONTRAST CT CERVICAL SPINE WITHOUT CONTRAST TECHNIQUE:  Multidetector CT imaging of the head, cervical spine, and maxillofacial structures were performed using the standard protocol without intravenous contrast. Multiplanar CT image reconstructions of the cervical spine and maxillofacial structures were also generated. RADIATION DOSE REDUCTION: This exam was performed according to the departmental dose-optimization program which includes automated exposure control, adjustment of the mA and/or kV according to patient size and/or use of iterative reconstruction technique. COMPARISON:  Head CT 11/29/09 FINDINGS: CT HEAD FINDINGS Brain: No hemorrhage. No hydrocephalus. No extra-axial fluid collection. No mass effect. No mass lesion. No CT evidence of an acute cortical infarct. Vascular: No hyperdense vessel or unexpected calcification. Skull: Normal. Negative for fracture or focal lesion. Other: None. CT MAXILLOFACIAL FINDINGS Osseous: No fracture or mandibular dislocation. No destructive process. There is a lucent lesion of the sphenoid wing on the right, unchanged compared to 11/29/2009 Orbits: Negative. No traumatic or inflammatory finding. Sinuses: No middle ear or mastoid effusion. Paranasal sinuses are clear. Soft tissues: There is soft tissue swelling in the periorbital soft tissues on the left and along left frontal scalp CT CERVICAL SPINE FINDINGS Alignment: Grade 1 anterolisthesis of C3 on C4 and C4 on C5. Skull base and vertebrae: No acute fracture. No primary bone lesion or focal pathologic process. Soft tissues and spinal canal: No prevertebral fluid or swelling. No visible canal hematoma. Disc levels:  No CT evidence high-grade spinal canal stenosis Upper chest: Negative. Other: None IMPRESSION: 1. No CT evidence of intracranial injury. 2. No acute facial bone fracture. 3. No acute fracture or traumatic subluxation of the cervical spine. 4. Soft tissue swelling in the periorbital soft tissues on the left and along the left frontal scalp. Electronically Signed    By: Lyndall Gore M.D.   On: 02/01/2024 13:02   CT Maxillofacial Wo Contrast Result Date: 02/01/2024 CLINICAL DATA:  Ground-level fall with obvious head injury 2 days ago. Positive LOC. Ongoing headache.; Ground-level fall with facial injury. Hematoma and swelling over the left eyebrow and left cheek. Dried blood around the mouth; Ground-level fall with head neck trauma EXAM: CT HEAD WITHOUT CONTRAST CT MAXILLOFACIAL WITHOUT CONTRAST CT CERVICAL SPINE WITHOUT CONTRAST TECHNIQUE: Multidetector CT imaging of the head, cervical spine, and maxillofacial structures were performed using the standard protocol without intravenous contrast. Multiplanar CT image reconstructions of the cervical spine and maxillofacial structures were also generated. RADIATION DOSE REDUCTION: This exam was performed according to the departmental dose-optimization program which includes automated exposure control, adjustment of the mA and/or kV according to patient size and/or use of iterative reconstruction technique. COMPARISON:  Head CT 11/29/09 FINDINGS: CT HEAD FINDINGS Brain: No hemorrhage. No hydrocephalus. No extra-axial fluid collection. No mass effect. No mass lesion.  No CT evidence of an acute cortical infarct. Vascular: No hyperdense vessel or unexpected calcification. Skull: Normal. Negative for fracture or focal lesion. Other: None. CT MAXILLOFACIAL FINDINGS Osseous: No fracture or mandibular dislocation. No destructive process. There is a lucent lesion of the sphenoid wing on the right, unchanged compared to 11/29/2009 Orbits: Negative. No traumatic or inflammatory finding. Sinuses: No middle ear or mastoid effusion. Paranasal sinuses are clear. Soft tissues: There is soft tissue swelling in the periorbital soft tissues on the left and along left frontal scalp CT CERVICAL SPINE FINDINGS Alignment: Grade 1 anterolisthesis of C3 on C4 and C4 on C5. Skull base and vertebrae: No acute fracture. No primary bone lesion or focal  pathologic process. Soft tissues and spinal canal: No prevertebral fluid or swelling. No visible canal hematoma. Disc levels:  No CT evidence high-grade spinal canal stenosis Upper chest: Negative. Other: None IMPRESSION: 1. No CT evidence of intracranial injury. 2. No acute facial bone fracture. 3. No acute fracture or traumatic subluxation of the cervical spine. 4. Soft tissue swelling in the periorbital soft tissues on the left and along the left frontal scalp. Electronically Signed   By: Lyndall Gore M.D.   On: 02/01/2024 13:02   CT Cervical Spine Wo Contrast Result Date: 02/01/2024 CLINICAL DATA:  Ground-level fall with obvious head injury 2 days ago. Positive LOC. Ongoing headache.; Ground-level fall with facial injury. Hematoma and swelling over the left eyebrow and left cheek. Dried blood around the mouth; Ground-level fall with head neck trauma EXAM: CT HEAD WITHOUT CONTRAST CT MAXILLOFACIAL WITHOUT CONTRAST CT CERVICAL SPINE WITHOUT CONTRAST TECHNIQUE: Multidetector CT imaging of the head, cervical spine, and maxillofacial structures were performed using the standard protocol without intravenous contrast. Multiplanar CT image reconstructions of the cervical spine and maxillofacial structures were also generated. RADIATION DOSE REDUCTION: This exam was performed according to the departmental dose-optimization program which includes automated exposure control, adjustment of the mA and/or kV according to patient size and/or use of iterative reconstruction technique. COMPARISON:  Head CT 11/29/09 FINDINGS: CT HEAD FINDINGS Brain: No hemorrhage. No hydrocephalus. No extra-axial fluid collection. No mass effect. No mass lesion. No CT evidence of an acute cortical infarct. Vascular: No hyperdense vessel or unexpected calcification. Skull: Normal. Negative for fracture or focal lesion. Other: None. CT MAXILLOFACIAL FINDINGS Osseous: No fracture or mandibular dislocation. No destructive process. There is a lucent  lesion of the sphenoid wing on the right, unchanged compared to 11/29/2009 Orbits: Negative. No traumatic or inflammatory finding. Sinuses: No middle ear or mastoid effusion. Paranasal sinuses are clear. Soft tissues: There is soft tissue swelling in the periorbital soft tissues on the left and along left frontal scalp CT CERVICAL SPINE FINDINGS Alignment: Grade 1 anterolisthesis of C3 on C4 and C4 on C5. Skull base and vertebrae: No acute fracture. No primary bone lesion or focal pathologic process. Soft tissues and spinal canal: No prevertebral fluid or swelling. No visible canal hematoma. Disc levels:  No CT evidence high-grade spinal canal stenosis Upper chest: Negative. Other: None IMPRESSION: 1. No CT evidence of intracranial injury. 2. No acute facial bone fracture. 3. No acute fracture or traumatic subluxation of the cervical spine. 4. Soft tissue swelling in the periorbital soft tissues on the left and along the left frontal scalp. Electronically Signed   By: Lyndall Gore M.D.   On: 02/01/2024 13:02   Labs on Admission: I have personally reviewed following labs  CBC: Recent Labs  Lab 02/01/24 1221  WBC 8.8  NEUTROABS 7.5  HGB 13.9  HCT 41.5  MCV 92.6  PLT 115*   Basic Metabolic Panel: Recent Labs  Lab 02/01/24 1221  NA 132*  K 3.5  CL 89*  CO2 30  GLUCOSE 137*  BUN 71*  CREATININE 1.19*  CALCIUM  9.2   GFR: Estimated Creatinine Clearance: 35.3 mL/min (A) (by C-G formula based on SCr of 1.19 mg/dL (H)).  Liver Function Tests: Recent Labs  Lab 02/01/24 1221  AST 65*  ALT 26  ALKPHOS 30*  BILITOT 0.6  PROT 6.4*  ALBUMIN 3.1*   Coagulation Profile: Recent Labs  Lab 02/01/24 1221  INR 1.0   Urine analysis:    Component Value Date/Time   COLORURINE Yellow 04/27/2013 2013   APPEARANCEUR Cloudy 04/27/2013 2013   LABSPEC 1.020 04/27/2013 2013   PHURINE 5.0 04/27/2013 2013   GLUCOSEU Negative 04/27/2013 2013   HGBUR Negative 04/27/2013 2013   BILIRUBINUR  Negative 04/27/2013 2013   KETONESUR Negative 04/27/2013 2013   PROTEINUR Negative 04/27/2013 2013   NITRITE Negative 04/27/2013 2013   LEUKOCYTESUR 3+ 04/27/2013 2013   This document was prepared using Dragon Voice Recognition software and may include unintentional dictation errors.  Dr. Sherre [facility]  If 7PM-7AM, please contact overnight-coverage provider If 7AM-7PM, please contact day attending provider [email or web address]  02/01/2024, 2:47 PM

## 2024-02-01 NOTE — ED Triage Notes (Signed)
 Pt presents to the ED via [facility] from home. Pt went to PCP today for a cough where they found her RA sat to be 64%. Pt was placed on NRB by EMS. Per EMS they were able to titrate down to 8L NRB. Upon arrival to facility NRB removed immediately and 5L O2 via [place] was placed.   Pt had a fall Sunday evening where she hit her head. Bruising noted to left eye. +LOC per pt.

## 2024-02-02 ENCOUNTER — Inpatient Hospital Stay (HOSPITAL_COMMUNITY)
Admit: 2024-02-02 | Discharge: 2024-02-02 | Disposition: A | Discharge status: Home / Self Care | Payer: No Typology Code available for payment source | Attending: Family Medicine

## 2024-02-02 ENCOUNTER — Other Ambulatory Visit (HOSPITAL_COMMUNITY): Payer: Self-pay

## 2024-02-02 ENCOUNTER — Telehealth (HOSPITAL_COMMUNITY): Payer: Self-pay | Admitting: Pharmacy Technician

## 2024-02-02 ENCOUNTER — Other Ambulatory Visit: Payer: Self-pay

## 2024-02-02 DIAGNOSIS — E785 Hyperlipidemia, unspecified: Secondary | ICD-10-CM | POA: Diagnosis not present

## 2024-02-02 DIAGNOSIS — J189 Pneumonia, unspecified organism: Secondary | ICD-10-CM | POA: Diagnosis not present

## 2024-02-02 DIAGNOSIS — I4891 Unspecified atrial fibrillation: Secondary | ICD-10-CM

## 2024-02-02 DIAGNOSIS — N179 Acute kidney failure, unspecified: Secondary | ICD-10-CM | POA: Diagnosis not present

## 2024-02-02 DIAGNOSIS — I480 Paroxysmal atrial fibrillation: Secondary | ICD-10-CM

## 2024-02-02 DIAGNOSIS — K219 Gastro-esophageal reflux disease without esophagitis: Secondary | ICD-10-CM | POA: Diagnosis not present

## 2024-02-02 DIAGNOSIS — A419 Sepsis, unspecified organism: Secondary | ICD-10-CM

## 2024-02-02 DIAGNOSIS — J9601 Acute respiratory failure with hypoxia: Secondary | ICD-10-CM | POA: Diagnosis not present

## 2024-02-02 DIAGNOSIS — R9431 Abnormal electrocardiogram [ECG] [EKG]: Secondary | ICD-10-CM

## 2024-02-02 HISTORY — DX: Unspecified atrial fibrillation: I48.91

## 2024-02-02 HISTORY — DX: Sepsis, unspecified organism: A41.9

## 2024-02-02 LAB — BASIC METABOLIC PANEL
Anion gap: 9 (ref 5–15)
BUN: 54 mg/dL — ABNORMAL HIGH (ref 8–23)
CO2: 28 mmol/L (ref 22–32)
Calcium: 8.2 mg/dL — ABNORMAL LOW (ref 8.9–10.3)
Chloride: 99 mmol/L (ref 98–111)
Creatinine, Ser: 1.17 mg/dL — ABNORMAL HIGH (ref 0.44–1.00)
GFR, Estimated: 52 mL/min — ABNORMAL LOW (ref >60)
Glucose, Bld: 105 mg/dL — ABNORMAL HIGH (ref 70–99)
Potassium: 3.5 mmol/L (ref 3.5–5.1)
Sodium: 136 mmol/L (ref 135–145)

## 2024-02-02 LAB — CBC
HCT: 40.2 % (ref 36.0–46.0)
Hemoglobin: 13 g/dL (ref 12.0–15.0)
MCH: 31.1 pg (ref 26.0–34.0)
MCHC: 32.3 g/dL (ref 30.0–36.0)
MCV: 96.2 fL (ref 80.0–100.0)
Platelets: 95 10*3/uL — ABNORMAL LOW (ref 150–400)
RBC: 4.18 MIL/uL (ref 3.87–5.11)
RDW: 15.2 % (ref 11.5–15.5)
WBC: 9.6 10*3/uL (ref 4.0–10.5)
nRBC: 0.2 % (ref 0.0–0.2)

## 2024-02-02 LAB — ECHOCARDIOGRAM COMPLETE
AR max vel: 1.56 cm2
AV Area VTI: 1.71 cm2
AV Area mean vel: 1.42 cm2
AV Mean grad: 22 mm[Hg]
AV Peak grad: 35.5 mm[Hg]
Ao pk vel: 2.98 m/s
Area-P 1/2: 4.21 cm2
Height: 57 in
MV VTI: 2.67 cm2
S' Lateral: 2.3 cm
Weight: 2032 [oz_av]

## 2024-02-02 LAB — TSH: TSH: 0.228 u[IU]/mL — ABNORMAL LOW (ref 0.350–4.500)

## 2024-02-02 LAB — VITAMIN B12: Vitamin B-12: 6774 pg/mL — ABNORMAL HIGH (ref 180–914)

## 2024-02-02 LAB — MAGNESIUM: Magnesium: 1.7 mg/dL (ref 1.7–2.4)

## 2024-02-02 LAB — PHOSPHORUS
Phosphorus: 1.5 mg/dL — ABNORMAL LOW (ref 2.5–4.6)
Phosphorus: 2.6 mg/dL (ref 2.5–4.6)

## 2024-02-02 LAB — HEPARIN LEVEL (UNFRACTIONATED): Heparin Unfractionated: 0.7 [IU]/mL (ref 0.30–0.70)

## 2024-02-02 LAB — HIV ANTIBODY (ROUTINE TESTING W REFLEX): HIV Screen 4th Generation wRfx: NONREACTIVE

## 2024-02-02 MED ORDER — AMIODARONE HCL IN DEXTROSE 360-4.14 MG/200ML-% IV SOLN
INTRAVENOUS | Status: AC
  Administered 2024-02-02: 60 mg/h via INTRAVENOUS
  Filled 2024-02-02: qty 200

## 2024-02-02 MED ORDER — METOPROLOL SUCCINATE ER 25 MG PO TB24
12.5000 mg | ORAL_TABLET | Freq: Two times a day (BID) | ORAL | Status: DC
Start: 2024-02-02 — End: 2024-02-03
  Administered 2024-02-02: 12.5 mg via ORAL
  Filled 2024-02-02: qty 1

## 2024-02-02 MED ORDER — HEPARIN (PORCINE) 25000 UT/250ML-% IV SOLN
700.0000 [IU]/h | INTRAVENOUS | Status: DC
  Administered 2024-02-02: 750 [IU]/h via INTRAVENOUS
  Filled 2024-02-02 (×2): qty 250

## 2024-02-02 MED ORDER — METOPROLOL SUCCINATE ER 25 MG PO TB24
12.5000 mg | ORAL_TABLET | Freq: Every day | ORAL | Status: DC
  Administered 2024-02-02: 12.5 mg via ORAL
  Filled 2024-02-02: qty 1

## 2024-02-02 MED ORDER — AMIODARONE IV BOLUS ONLY 150 MG/100ML: 150.0000 mg | Freq: Once | INTRAVENOUS | Status: AC

## 2024-02-02 MED ORDER — DOXYCYCLINE HYCLATE 100 MG IV SOLR
100.0000 mg | Freq: Two times a day (BID) | INTRAVENOUS | Status: DC
  Administered 2024-02-03 – 2024-02-07 (×9): 100 mg via INTRAVENOUS
  Filled 2024-02-02 (×10): qty 100

## 2024-02-02 MED ORDER — AMIODARONE IV BOLUS ONLY 150 MG/100ML
INTRAVENOUS | Status: AC
  Administered 2024-02-02: 150 mg via INTRAVENOUS
  Filled 2024-02-02: qty 100

## 2024-02-02 MED ORDER — MAGNESIUM SULFATE 2 GM/50ML IV SOLN
2.0000 g | Freq: Once | INTRAVENOUS | Status: AC
Start: 2024-02-02 — End: 2024-02-02
  Administered 2024-02-02: 2 g via INTRAVENOUS
  Filled 2024-02-02: qty 50

## 2024-02-02 MED ORDER — AMIODARONE HCL IN DEXTROSE 360-4.14 MG/200ML-% IV SOLN
60.0000 mg/h | INTRAVENOUS | Status: AC
  Administered 2024-02-02: 60 mg/h via INTRAVENOUS
  Filled 2024-02-02: qty 200

## 2024-02-02 MED ORDER — HEPARIN BOLUS VIA INFUSION
3000.0000 [IU] | Freq: Once | INTRAVENOUS | Status: AC
  Administered 2024-02-02: 3000 [IU] via INTRAVENOUS
  Filled 2024-02-02: qty 3000

## 2024-02-02 MED ORDER — METOPROLOL TARTRATE 5 MG/5ML IV SOLN
5.0000 mg | Freq: Once | INTRAVENOUS | Status: AC
Start: 2024-02-02 — End: 2024-02-02
  Administered 2024-02-02: 5 mg via INTRAVENOUS
  Filled 2024-02-02: qty 5

## 2024-02-02 MED ORDER — AMIODARONE HCL IN DEXTROSE 360-4.14 MG/200ML-% IV SOLN
30.0000 mg/h | INTRAVENOUS | Status: DC
  Administered 2024-02-02: 59.94 mg/h via INTRAVENOUS
  Administered 2024-02-03 – 2024-02-06 (×6): 30 mg/h via INTRAVENOUS
  Filled 2024-02-02 (×7): qty 200

## 2024-02-02 MED ORDER — K PHOS MONO-SOD PHOS DI & MONO 155-852-130 MG PO TABS
500.0000 mg | ORAL_TABLET | ORAL | Status: AC
  Administered 2024-02-02 – 2024-02-03 (×4): 500 mg via ORAL
  Filled 2024-02-02 (×5): qty 2

## 2024-02-02 NOTE — Consult Note (Signed)
 Pharmacy Consult Note - Anticoagulation  Pharmacy Consult for heparin  Indication: atrial fibrillation  PATIENT MEASUREMENTS: Height: 4' 9 (144.8 cm) Weight: 57.6 kg (127 lb) IBW/kg (Calculated) : 38.6 HEPARIN  DW (KG): 51.1  VITAL SIGNS: Temp: 98.1 F (36.7 C) (02/05 0915) Temp Source: Axillary (02/05 0915) BP: 116/87 (02/05 1400) Pulse Rate: 89 (02/05 1500)  Recent Labs    02/01/24 1221 02/02/24 0437  HGB 13.9 13.0  HCT 41.5 40.2  PLT 115* 95*  APTT 28  --   LABPROT 13.7  --   INR 1.0  --   CREATININE 1.19* 1.17*    Estimated Creatinine Clearance: 35.9 mL/min (A) (by C-G formula based on SCr of 1.17 mg/dL (H)).  PAST MEDICAL HISTORY: Past Medical History:  Diagnosis Date   Asthma    Bicuspid aortic valve 09/20/2016   a. echo 09/19/16: EF 55-60%, GR1DD, possible bicuspid aortic valve without evidence of AS   Bronchitis    Coronary artery disease, non-occlusive    a. cath 09/21/16: ostLM to LM 20%, no evidence of aortic stenosis   Demand ischemia (HCC) 09/20/2016   Heart murmur    Hiatal hernia    History of blood transfusion    Hypertension    Persistent cough for 3 weeks or longer 05/19/2022   Ulcer     ASSESSMENT: 63 y.o. female with PMH including HTN, HFpEF, mild aortic calcification is presenting with new-onset atrial fibrillation with RVR. Patient is not on chronic anticoagulation per chart review. CHA2DS2VASc is 3 (HTN, CHF, female sex). Pharmacy has been consulted to initiate and manage heparin  intravenous infusion.  Pertinent medications: No chronic anticoagulation PTA per chart review  Goal(s) of therapy: Heparin  level 0.3 - 0.7 units/mL Monitor platelets by anticoagulation protocol: Yes   Baseline anticoagulation labs: Recent Labs    02/01/24 1221 02/02/24 0437  APTT 28  --   INR 1.0  --   HGB 13.9 13.0  PLT 115* 95*    Date Time aPTT/HL Rate/Comment      PLAN: Give 3000 units bolus x1; then start heparin  infusion at 750  units/hour. Check heparin  level in 6 hours, then daily once at least two levels are consecutively therapeutic. Monitor CBC daily while on heparin  infusion.   Will M. Lenon, PharmD Clinical Pharmacist 02/02/2024 3:04 PM

## 2024-02-02 NOTE — Consult Note (Signed)
 Pharmacy Consult Note - Anticoagulation  Pharmacy Consult for heparin  Indication: atrial fibrillation  PATIENT MEASUREMENTS: Height: 4' 9 (144.8 cm) Weight: 57.6 kg (127 lb) IBW/kg (Calculated) : 38.6 HEPARIN  DW (KG): 51.1  VITAL SIGNS: Temp: 99.5 F (37.5 C) (02/05 2316) Temp Source: Oral (02/05 2316) BP: 120/68 (02/05 2316) Pulse Rate: 84 (02/05 2316)  Recent Labs    02/01/24 1221 02/02/24 0437 02/02/24 2252  HGB 13.9 13.0  --   HCT 41.5 40.2  --   PLT 115* 95*  --   APTT 28  --   --   LABPROT 13.7  --   --   INR 1.0  --   --   HEPARINUNFRC  --   --  0.70  CREATININE 1.19* 1.17*  --     Estimated Creatinine Clearance: 35.9 mL/min (A) (by C-G formula based on SCr of 1.17 mg/dL (H)).  PAST MEDICAL HISTORY: Past Medical History:  Diagnosis Date   Asthma    Bicuspid aortic valve 09/20/2016   a. echo 09/19/16: EF 55-60%, GR1DD, possible bicuspid aortic valve without evidence of AS   Bronchitis    Coronary artery disease, non-occlusive    a. cath 09/21/16: ostLM to LM 20%, no evidence of aortic stenosis   Demand ischemia (HCC) 09/20/2016   Heart murmur    Hiatal hernia    History of blood transfusion    Hypertension    Persistent cough for 3 weeks or longer 05/19/2022   Ulcer     ASSESSMENT: 63 y.o. female with PMH including HTN, HFpEF, mild aortic calcification is presenting with new-onset atrial fibrillation with RVR. Patient is not on chronic anticoagulation per chart review. CHA2DS2VASc is 3 (HTN, CHF, female sex). Pharmacy has been consulted to initiate and manage heparin  intravenous infusion.  Pertinent medications: No chronic anticoagulation PTA per chart review  Goal(s) of therapy: Heparin  level 0.3 - 0.7 units/mL Monitor platelets by anticoagulation protocol: Yes   Baseline anticoagulation labs: Recent Labs    02/01/24 1221 02/02/24 0437  APTT 28  --   INR 1.0  --   HGB 13.9 13.0  PLT 115* 95*    02/05 2252 HL 0.7, therapeutic x  1  PLAN: Continue heparin  infusion at 750 units/hour. Recheck HL w/ AM labs to confirm, then daily once at least two levels are consecutively therapeutic. Monitor CBC daily while on heparin  infusion.  Rankin CANDIE Dills, PharmD, [facility] 02/02/2024 11:56 PM

## 2024-02-02 NOTE — ED Notes (Signed)
 MD made aware of pt having multiple runs of vtach. Pt in no acute distress at this time.

## 2024-02-02 NOTE — ED Notes (Signed)
 Pt contact made and myself introduced. Pt is CAOx4 and tachypneic but adequate. Pt is normal in color. Pt was assisted out of bed, assisted walking to toilet to void. Pt's bedding then changed, pt cleaned, and placed in clean gown and brief. Pt assisted back to bed and is now resting.

## 2024-02-02 NOTE — ED Notes (Signed)
DO at bedside

## 2024-02-02 NOTE — ED Notes (Signed)
 MD made aware of pt's HR up to 180 at this time. MD notified of cardiac monitoring strip in chart.  Pt HR returning to 106 at this time. MD made aware that HR increases up to 180bpm and then returns to 105-110bpm. MD made aware that this occurrence is frequent occurring every 5-10 minutes.

## 2024-02-02 NOTE — ED Notes (Signed)
 Pt assisted walking to the toilet and back. Pt placed in a new brief and settled back in bed.

## 2024-02-02 NOTE — ED Notes (Signed)
 Cardiologist at bedside.

## 2024-02-02 NOTE — Consult Note (Signed)
 Cardiology Consultation   Patient ID: Michelle Barnett MRN: [id number]; DOB: 1961-11-11  Admit date: 02/01/2024 Date of Consult: 02/02/2024  PCP:  Gretta Comer POUR, NP   [facility] Providers Cardiologist:  None        Patient Profile:   Michelle Barnett is a 63 y.o. female with a hx of asthma/COPD, bicuspid aortic valve, non-occlusive CAD, and hypertension who is being seen 02/02/2024 for the evaluation of abnormal EKG at the request of Dr. Leesa.  History of Present Illness:   [name] was hospitalized 08/2016 with acute respiratory failure with hypoxia in the setting of COPD exacerbation and bacteremia/sepsis. Echo showed EF 55-60%, GIDD, possible bicuspid aortic valve without aortic stenosis. She underwent LHC which showed non-occlusive CAD with ostial LM to LM lesion, 20% stenosis. She did follow up in our office 09/2016 and was doing well at that time. Not on ASA due to anaphylaxis allergy. No BB due to asthma. No medication changes were made. She was lost to follow up with our office.   History is obtained by patient and family member. She reports cough and shortness of breath ongoing for the past few weeks with associated fever. She also fell this past Sunday and did hit her head with some confusion since then. She was seen by her PCP office 2/4 for these symptoms. She was found to be hypoxic with SpO2 80% on room air. She was then transferred to the ED via EMS. Denies chest pain, palpitations, nausea, lightheadedness, and diaphoresis.   In the ED, BP 126/57, HR 104, RR 25, T 98.1 F, and SpO2 95% on 5 L supplemental O2. BMP with BUN 71, Cr 1.19. CBC largely unremarkable. Respiratory panel negative. CXR with evidence of right lower lobe pneumonia. Patient was started on antibiotics and given DuoNeb treatment.  Past Medical History:  Diagnosis Date   Asthma    Bicuspid aortic valve 09/20/2016   a. echo 09/19/16: EF 55-60%, GR1DD, possible bicuspid aortic valve without  evidence of AS   Bronchitis    Coronary artery disease, non-occlusive    a. cath 09/21/16: ostLM to LM 20%, no evidence of aortic stenosis   Demand ischemia (HCC) 09/20/2016   Heart murmur    Hiatal hernia    History of blood transfusion    Hypertension    Persistent cough for 3 weeks or longer 05/19/2022   Ulcer     Past Surgical History:  Procedure Laterality Date   abdominal tumor     ABLATION     CARDIAC CATHETERIZATION N/A 09/21/2016   Procedure: Left Heart Cath and Coronary Angiography;  Surgeon: Deatrice DELENA Cage, MD;  Location: [facility] INVASIVE CV LAB;  Service: Cardiovascular;  Laterality: N/A;   COLONOSCOPY N/A 08/14/2016   Procedure: COLONOSCOPY;  Surgeon: Victory LITTIE Legrand DOUGLAS, MD;  Location: [facility] ENDOSCOPY;  Service: Endoscopy;  Laterality: N/A;   ESOPHAGOGASTRODUODENOSCOPY N/A 08/13/2016   Procedure: ESOPHAGOGASTRODUODENOSCOPY (EGD);  Surgeon: Victory LITTIE Legrand DOUGLAS, MD;  Location: [facility] ENDOSCOPY;  Service: Gastroenterology;  Laterality: N/A;   TOOTH EXTRACTION         Inpatient Medications: Scheduled Meds:  atorvastatin   40 mg Oral QHS   cyanocobalamin   1,000 mcg Oral Daily   heparin   5,000 Units Subcutaneous Q8H   ipratropium-albuterol   3 mL Nebulization QID   metoprolol  succinate  12.5 mg Oral Daily   Continuous Infusions:  sodium chloride  100 mL/hr at 02/02/24 1005   amiodarone      amiodarone   amiodarone      cefTRIAXone  (ROCEPHIN )  IV Stopped (02/02/24 1005)   [START ON 02/03/2024] doxycycline  (VIBRAMYCIN ) IV     PRN Meds: acetaminophen  **OR** acetaminophen , albuterol , amiodarone , chlorpheniramine-HYDROcodone , guaiFENesin , hydrALAZINE , ondansetron  **OR** ondansetron  (ZOFRAN ) IV, senna-docusate  Allergies:    Allergies  Allergen Reactions   Amlodipine  Rash   Aspirin Anaphylaxis   Dairy Aid [Tilactase] Swelling and Other (See Comments)    Any dairy products   Benadryl [Diphenhydramine] Palpitations   Penicillins Other (See Comments)    Reaction: Unknown     Social History:   Social History   Socioeconomic History   Marital status: Single    Spouse name: Not on file   Number of children: Not on file   Years of education: Not on file   Highest education level: Not on file  Occupational History   Not on file  Tobacco Use   Smoking status: Never   Smokeless tobacco: Never  Substance and Sexual Activity   Alcohol use: No   Drug use: No   Sexual activity: Not Currently  Other Topics Concern   Not on file  Social History Narrative   Not on file   Social Drivers of Health   Financial Resource Strain: Not on file  Food Insecurity: Not on file  Transportation Needs: Not on file  Physical Activity: Not on file  Stress: Not on file  Social Connections: Not on file  Intimate Partner Violence: Not on file    Family History:    Family History  Problem Relation Age of Onset   Stroke Mother    Hypertension Mother    Hypertension Father    Heart disease Father    Hypertension Brother    Breast cancer Maternal Aunt      ROS:  Please see the history of present illness.   Physical Exam/Data:   Vitals:   02/02/24 1230 02/02/24 1300 02/02/24 1310 02/02/24 1330  BP: 110/64 120/89 130/82   Pulse: (!) 115 73  (!) 137  Resp: (!) 21 20 (!) 26 (!) 22  Temp:      TempSrc:      SpO2: 95% 93%  94%  Weight:      Height:        Intake/Output Summary (Last 24 hours) at 02/02/2024 1333 Last data filed at 02/02/2024 0125 Gross per 24 hour  Intake 3222.11 ml  Output --  Net 3222.11 ml      02/01/2024   12:12 PM 02/01/2024   10:55 AM 09/24/2023    8:25 AM  Last 3 Weights  Weight (lbs) 127 lb -- 122 lb  Weight (kg) 57.607 kg -- 55.339 kg     Body mass index is 27.48 kg/m.  General:  Well nourished, well developed, in no acute distress HEENT: normal Neck: no JVD Cardiac:  normal S1, S2; irregular; + murmur  Lungs: Crackles noted, worse on right  Abd: soft, nontender, no hepatomegaly  Ext: trace LE edema Skin: warm and dry   Psych:  Normal affect   EKG:  The EKG was personally reviewed and demonstrates:  sinus rhythm with RBBB and PACs, rate 96 bpm Telemetry:  Telemetry was personally reviewed and demonstrates:  sinus rhythm with RBBB, frequent PACs, with conversion to atrial fibrillation with RVR ~1230, rate averaging 100-120 bpm, up to 180 bpm  Relevant CV Studies:  05/27/2022 TTE 1. Left ventricular ejection fraction, by estimation, is 60 to 65%. The  left ventricle has normal function. The left ventricle has no regional  wall motion abnormalities. There is mild left ventricular hypertrophy.  Left ventricular diastolic parameters  are consistent with Grade II diastolic dysfunction (pseudonormalization).   2. Right ventricular systolic function is normal. The right ventricular  size is normal.   3. Left atrial size was mildly dilated.   4. The mitral valve is grossly normal. Mild mitral valve regurgitation.   5. The aortic valve is calcified. Aortic valve regurgitation is not  visualized. Mild aortic valve stenosis.   6. The inferior vena cava is normal in size with greater than 50%  respiratory variability, suggesting right atrial pressure of 3 mmHg.   09/13/2016 LHC Ost LM to LM lesion, 20 %stenosed. There is no aortic valve stenosis.   1. Mild ostial left main stenosis due to slight kinking of the vessel. Otherwise no evidence of obstructive coronary artery disease. 2. Normal left ventricular end-diastolic pressure with minimal gradient across the aortic valve. Normal ejection fraction by echo.  Laboratory Data:  High Sensitivity Troponin:  No results for input(s): TROPONINIHS in the last 720 hours.   Chemistry Recent Labs  Lab 02/01/24 1221 02/02/24 0437  NA 132* 136  K 3.5 3.5  CL 89* 99  CO2 30 28  GLUCOSE 137* 105*  BUN 71* 54*  CREATININE 1.19* 1.17*  CALCIUM  9.2 8.2*  GFRNONAA 51* 52*  ANIONGAP 13 9    Recent Labs  Lab 02/01/24 1221  PROT 6.4*  ALBUMIN 3.1*  AST 65*  ALT  26  ALKPHOS 30*  BILITOT 0.6   Lipids No results for input(s): CHOL, TRIG, HDL, LABVLDL, LDLCALC, CHOLHDL in the last 168 hours.  Hematology Recent Labs  Lab 02/01/24 1221 02/02/24 0437  WBC 8.8 9.6  RBC 4.48 4.18  HGB 13.9 13.0  HCT 41.5 40.2  MCV 92.6 96.2  MCH 31.0 31.1  MCHC 33.5 32.3  RDW 14.7 15.2  PLT 115* 95*   Thyroid  No results for input(s): TSH, FREET4 in the last 168 hours.  BNPNo results for input(s): BNP, PROBNP in the last 168 hours.  DDimer No results for input(s): DDIMER in the last 168 hours.   Radiology/Studies:  DG Chest Port 1 View Result Date: 02/01/2024 IMPRESSION: *Heterogeneous opacities overlying the right mid lower lung zones without significant volume loss, concerning for pneumonia. Follow-up to clearing is recommended. *Subtle blunting of bilateral lateral costophrenic angles may represent trace pleural effusions. Electronically Signed   By: Ree Molt M.D.   On: 02/01/2024 13:11   CT Head Wo Contrast Result Date: 02/01/2024 IMPRESSION: 1. No CT evidence of intracranial injury. 2. No acute facial bone fracture. 3. No acute fracture or traumatic subluxation of the cervical spine. 4. Soft tissue swelling in the periorbital soft tissues on the left and along the left frontal scalp. Electronically Signed   By: Lyndall Gore M.D.   On: 02/01/2024 13:02   CT Maxillofacial Wo Contrast Result Date: 02/01/2024 IMPRESSION: 1. No CT evidence of intracranial injury. 2. No acute facial bone fracture. 3. No acute fracture or traumatic subluxation of the cervical spine. 4. Soft tissue swelling in the periorbital soft tissues on the left and along the left frontal scalp. Electronically Signed   By: Lyndall Gore M.D.   On: 02/01/2024 13:02   CT Cervical Spine Wo Contrast Result Date: 02/01/2024 IMPRESSION: 1. No CT evidence of intracranial injury. 2. No acute facial bone fracture. 3. No acute fracture or traumatic subluxation of the cervical  spine. 4. Soft tissue swelling in the periorbital soft tissues on  the left and along the left frontal scalp. Electronically Signed   By: Lyndall Gore M.D.   On: 02/01/2024 13:02     Assessment and Plan:   Right middle lobe pneumonia Hypoxia Asthma - Presented 2/4 with cough, dyspnea, and hypoxia - CXR with evidence of pneumonia - Management per IM  New onset atrial fibrillation with RVR - Patient's chart reviewed - no known history of atrial fibrillation - Telemetry shows conversion to atrial fibrillation with RVR ~1230 on 2/5. Rates average 100-120 bpm with some runs up to 180 bpm.  - Started on IV amiodarone  bolus and infusion per IM with conversion to sinus rhythm with frequent PACs, rate 90-100 bpm - CHA2DS2VASc 4 - Started IV heparin  - Continue IV amiodarone  infusion - Echo ordered  Acute kidney injury - Cr 1.17, likely secondary to poor intake - Management per IM  Ecchymosis of left eye - Present on admission secondary to fall at home  Hyperlipidemia - Most recent lipid panel 04/2023 with LDL 112 - Continue atorvastatin  40 mg daily  Essential hypertension - PTA BP medications on hold in the setting of AKI - BP stable  For questions or updates, please contact [facility] Please consult [email or web address] for contact info under    Signed, Lesley LITTIE Maffucci, PA-C  02/02/2024 1:33 PM

## 2024-02-02 NOTE — ED Notes (Signed)
 MD made aware of pt's HR getting up to 170 bpm for <2 minutes. Pt in no distress. MD made aware that strip was exported to chart.

## 2024-02-02 NOTE — ED Notes (Signed)
 DO Dezii made aware pt is having more frequent runs with less than 2 minutes between runs and maintaining more than 1 minute.

## 2024-02-02 NOTE — Telephone Encounter (Addendum)
 Patient Product/process development scientist completed.    The patient is insured through [employer]. Patient has [employer], may use a copay card, and/or apply for patient assistance if available.    Ran test claim for Eliquis  5 mg and Requires Prior Authorization  Ran test claim for Xarelto 20 mg and Requires Prior Authorization  This test claim was processed through [employer]- copay amounts may vary at other pharmacies due to [employer], or as the patient moves through the different stages of their insurance plan.     Reyes Sharps, CPHT Pharmacy Technician III Certified Patient Advocate [facility] Patient Advocate Team Direct Number: [phone]  Fax: [phone]

## 2024-02-02 NOTE — ED Notes (Addendum)
 DO Dezii paged at this time de to pt maintaining HR 150-170bpm. DO states she is coming to bedside. Pt alert an in NAD at this time.

## 2024-02-02 NOTE — ED Notes (Signed)
 EKG ordered by MD. RN obtained EKG at this time. MD aware that EKG is completed.

## 2024-02-02 NOTE — ED Notes (Signed)
 Pt has increased work of breathing AND o2 SATS 86% ON HUMidified O2 at 5L RT contacted and suggests pt be placed on Heated high flow O2 at 10L. Pt's O2 sats increased to 95% after this change. Will make provider aWARE

## 2024-02-02 NOTE — ED Notes (Signed)
 MD made aware RN exported new EKG for review.

## 2024-02-02 NOTE — Progress Notes (Addendum)
 PROGRESS NOTE    Michelle Barnett  FMW:[id number] DOB: 07/04/1961 DOA: 02/01/2024 PCP: Gretta Comer POUR, NP  Chief Complaint  Patient presents with   Respiratory Distress    Hospital Course:  Michelle Barnett is 63 y.o. female with hypertension, hyperlipidemia, iron deficiency anemia, who presents to the ED with acute hypoxia.  Patient was previously in her PCP office where she was noted to be 66% on room air, this improved on 3 L O2 but could not get higher than 88%.  EMS was called and brought the patient to the hospital.  In the ED she was found to be septic with a temperature of 100.7, respirations 20, tachycardic to 113 and hypoxic.  Labs were mostly unrevealing.  Patient underwent CT head, CT cervical spine, CT maxillofacial which revealed soft tissue swelling of the periorbital soft tissues of the left and along the left frontal scalp.  She received DuoNebs, azithromycin , ceftriaxone .  Subjective: On evaluation this morning patient reports she is feeling somewhat better.  She is having intermittent tachycardia.  Monitor being read as V. tach though it looks like wide-complex A-fib.  Rapid response called 1254 and I returned to bedside.  At the time of my evaluation heart rate is 150-1 80.  Patient is in no acute distress, denies any chest pain, no shortness of breath.  Blood pressure remained stable.  I have consulted with cardiology who was present at bedside.  Patient was started on amio bolus followed by infusion.  She was also given 5 mg IV metoprolol  push.  Heart rate has been responsive to this and is slowing now.   Objective: Vitals:   02/02/24 0610 02/02/24 0700 02/02/24 0730 02/02/24 0800  BP: (!) 100/50 (!) 108/55 (!) 103/55 114/66  Pulse: 86 75 79 73  Resp: (!) 24 (!) 21 (!) 24 19  Temp:      TempSrc:      SpO2: 100% 99% 100% 97%  Weight:      Height:        Intake/Output Summary (Last 24 hours) at 02/02/2024 0910 Last data filed at 02/02/2024 0125 Gross per 24 hour   Intake 3322.11 ml  Output --  Net 3322.11 ml   Filed Weights   02/01/24 1212  Weight: 57.6 kg    Examination: General exam: Appears calm and comfortable, NAD  Respiratory system: No work of breathing, symmetric chest wall expansion Cardiovascular system: Rapid irregular rhythm Gastrointestinal system: Abdomen is nondistended, soft and nontender.  Neuro: Alert and oriented to self and situation.  Disoriented to more complex questions including time and date. Extremities: Symmetric, expected ROM Skin: dried blood around lower lip and scattered lesions in upper lips, no mucosal lesions seen. Ecchymosis over left forehead and orbit Psychiatry: Demonstrates appropriate judgement and insight. Mood & affect appropriate for situation.   Assessment & Plan:  Principal Problem:   Right middle lobe pneumonia Active Problems:   AKI (acute kidney injury) (HCC)   Essential hypertension   Vitamin B12 deficiency   Adjustment disorder with mixed anxiety and depressed mood   GERD (gastroesophageal reflux disease)   Mild intermittent asthma without complication   Hyperlipidemia   Hypoxia   Ecchymosis of left eye    A-fib RVR - Likely exacerbated by hypoxia, and breathing treatments. - On amio infusion, will continue -- TSH ordered - Keep tight control of electrolytes, replace Phos and mag today - Appreciate cardiology consult - CHA2DS2-VASc: 4, currently on IV heparin  - Echo pending  Hypomagnesemia Hypophosphatemia -  Replace as needed  Right middle lobe pneumonia - Was on azithromycin  and ceftriaxone , will now change this to doxycycline  in light of amiodarone  - Continue flutter valve and incentive spirometry - Continue supplemental O2 and wean as tolerated  Hypoxia - Secondary to right middle lobe pneumonia - Continue supplemental O2, maintain sats above 92% - Continuous pulse ox - Patient meant to be on anuity inhaler outpatient but is not taking.  Asthma exacerbation -  Exacerbated by flu with superimposed bacterial pneumonia. - Continue DuoNebs 4 times daily - Albuterol  as needed for wheezing  AKI - Presumed prerenal in setting of poor p.o. intake - Baseline creatinine appears to be 0.57 - Status post IV fluids, will complete today - Creatinine improving - Avoid nephrotoxic meds - Renally dose when needed  Ecchymosis of left eye and forehead - Present on admission secondary to fall at home - No intracranial abnormalities  Hypertension - Lisinopril  and HCTZ held at time of admission for AKI. - Continue to monitor, titrate as needed  Memory loss - Patient's boyfriend at bedside reports that she has had increasing difficulty with memory outpatient.  He recently took her to PCP for this evaluation - Will obtain TSH, HIV, RPR, B12, thiamine  - Head CT negative for acute intracranial abnormalities, may benefit from brain MRI outpatient with formal neuropsych testing.  DVT prophylaxis: Heparin  infusion   Code Status: Full Code Family Communication: Boyfriend at bedside Disposition:  Status is: Inpatient Remains inpatient appropriate because: Workup ongoing, not medically cleared yet.  May eventually require discharge to SNF.  Will consult PT/OT when medically stable    Consultants:  Cardiology    Procedures:  N/a  Antimicrobials:  Anti-infectives (From admission, onward)    Start     Dose/Rate Route Frequency Ordered Stop   02/02/24 1100  azithromycin  (ZITHROMAX ) 500 mg in sodium chloride  0.9 % 250 mL IVPB        500 mg 250 mL/hr over 60 Minutes Intravenous Every 24 hours 02/01/24 1402 02/06/24 1059   02/02/24 1000  cefTRIAXone  (ROCEPHIN ) 2 g in sodium chloride  0.9 % 100 mL IVPB        2 g 200 mL/hr over 30 Minutes Intravenous Every 24 hours 02/01/24 1402 02/06/24 0959   02/01/24 1230  cefTRIAXone  (ROCEPHIN ) 2 g in sodium chloride  0.9 % 100 mL IVPB        2 g 200 mL/hr over 30 Minutes Intravenous  Once 02/01/24 1218 02/01/24 1309    02/01/24 1230  azithromycin  (ZITHROMAX ) 500 mg in sodium chloride  0.9 % 250 mL IVPB        500 mg 250 mL/hr over 60 Minutes Intravenous  Once 02/01/24 1218 02/01/24 1421       Data Reviewed: I have personally reviewed following labs and imaging studies CBC: Recent Labs  Lab 02/01/24 1221 02/02/24 0437  WBC 8.8 9.6  NEUTROABS 7.5  --   HGB 13.9 13.0  HCT 41.5 40.2  MCV 92.6 96.2  PLT 115* 95*   Basic Metabolic Panel: Recent Labs  Lab 02/01/24 1221 02/02/24 0437  NA 132* 136  K 3.5 3.5  CL 89* 99  CO2 30 28  GLUCOSE 137* 105*  BUN 71* 54*  CREATININE 1.19* 1.17*  CALCIUM  9.2 8.2*   GFR: Estimated Creatinine Clearance: 35.9 mL/min (A) (by C-G formula based on SCr of 1.17 mg/dL (H)). Liver Function Tests: Recent Labs  Lab 02/01/24 1221  AST 65*  ALT 26  ALKPHOS 30*  BILITOT 0.6  PROT  6.4*  ALBUMIN 3.1*   CBG: No results for input(s): GLUCAP in the last 168 hours.  Recent Results (from the past 240 hours)  Resp panel by RT-PCR (RSV, Flu A&B, Covid) Anterior Nasal Swab     Status: None   Collection Time: 02/01/24 12:21 PM   Specimen: Anterior Nasal Swab  Result Value Ref Range Status   SARS Coronavirus 2 by RT PCR NEGATIVE NEGATIVE Final    Comment: (NOTE) SARS-CoV-2 target nucleic acids are NOT DETECTED.  The SARS-CoV-2 RNA is generally detectable in upper respiratory specimens during the acute phase of infection. The lowest concentration of SARS-CoV-2 viral copies this assay can detect is 138 copies/mL. A negative result does not preclude SARS-Cov-2 infection and should not be used as the sole basis for treatment or other patient management decisions. A negative result may occur with  improper specimen collection/handling, submission of specimen other than nasopharyngeal swab, presence of viral mutation(s) within the areas targeted by this assay, and inadequate number of viral copies(<138 copies/mL). A negative result must be combined with clinical  observations, patient history, and epidemiological information. The expected result is Negative.  Fact Sheet for Patients:  bloggercourse.com  Fact Sheet for Healthcare Providers:  seriousbroker.it  This test is no t yet approved or cleared by the [place]  FDA and  has been authorized for detection and/or diagnosis of SARS-CoV-2 by FDA under an Emergency Use Authorization (EUA). This EUA will remain  in effect (meaning this test can be used) for the duration of the COVID-19 declaration under Section 564(b)(1) of the Act, 21 U.S.C.section 360bbb-3(b)(1), unless the authorization is terminated  or revoked sooner.       Influenza A by PCR NEGATIVE NEGATIVE Final   Influenza B by PCR NEGATIVE NEGATIVE Final    Comment: (NOTE) The Xpert Xpress SARS-CoV-2/FLU/RSV plus assay is intended as an aid in the diagnosis of influenza from Nasopharyngeal swab specimens and should not be used as a sole basis for treatment. Nasal washings and aspirates are unacceptable for Xpert Xpress SARS-CoV-2/FLU/RSV testing.  Fact Sheet for Patients: bloggercourse.com  Fact Sheet for Healthcare Providers: seriousbroker.it  This test is not yet approved or cleared by the [place]  FDA and has been authorized for detection and/or diagnosis of SARS-CoV-2 by FDA under an Emergency Use Authorization (EUA). This EUA will remain in effect (meaning this test can be used) for the duration of the COVID-19 declaration under Section 564(b)(1) of the Act, 21 U.S.C. section 360bbb-3(b)(1), unless the authorization is terminated or revoked.     Resp Syncytial Virus by PCR NEGATIVE NEGATIVE Final    Comment: (NOTE) Fact Sheet for Patients: bloggercourse.com  Fact Sheet for Healthcare Providers: seriousbroker.it  This test is not yet approved or cleared by  the [place]  FDA and has been authorized for detection and/or diagnosis of SARS-CoV-2 by FDA under an Emergency Use Authorization (EUA). This EUA will remain in effect (meaning this test can be used) for the duration of the COVID-19 declaration under Section 564(b)(1) of the Act, 21 U.S.C. section 360bbb-3(b)(1), unless the authorization is terminated or revoked.  Performed at [facility], [address], [place], [place] [address]   Blood Culture (routine x 2)     Status: None (Preliminary result)   Collection Time: 02/01/24 12:21 PM   Specimen: BLOOD  Result Value Ref Range Status   Specimen Description BLOOD LEFT ANTECUBITAL  Final   Special Requests   Final    BOTTLES DRAWN AEROBIC AND ANAEROBIC Blood  Culture results may not be optimal due to an inadequate volume of blood received in culture bottles   Culture   Final    NO GROWTH < 24 HOURS Performed at [facility], [address] [place], [place] [address]    Report Status PENDING  Incomplete  Blood Culture (routine x 2)     Status: None (Preliminary result)   Collection Time: 02/01/24 12:21 PM   Specimen: BLOOD  Result Value Ref Range Status   Specimen Description BLOOD BLOOD RIGHT FOREARM  Final   Special Requests   Final    BOTTLES DRAWN AEROBIC AND ANAEROBIC Blood Culture results may not be optimal due to an inadequate volume of blood received in culture bottles   Culture   Final    NO GROWTH < 24 HOURS Performed at [facility], [address], [place], [place] [address]    Report Status PENDING  Incomplete     Radiology Studies: DG Chest Port 1 View Result Date: 02/01/2024 CLINICAL DATA:  Questionable sepsis - evaluate for abnormality. Fall. EXAM: PORTABLE CHEST 1 VIEW COMPARISON:  09/24/2023. FINDINGS: There are heterogeneous opacities overlying the right mid lower lung zones without significant volume loss, concerning for pneumonia. There is subtle blunting of bilateral lateral  costophrenic angles, which may represent trace pleural effusions. Elevated left hemidiaphragm noted. There are probable atelectatic changes at the left lung base. No pneumothorax. Stable cardio-mediastinal silhouette. No acute osseous abnormalities. The soft tissues are within normal limits. IMPRESSION: *Heterogeneous opacities overlying the right mid lower lung zones without significant volume loss, concerning for pneumonia. Follow-up to clearing is recommended. *Subtle blunting of bilateral lateral costophrenic angles may represent trace pleural effusions. Electronically Signed   By: Ree Molt M.D.   On: 02/01/2024 13:11   CT Head Wo Contrast Result Date: 02/01/2024 CLINICAL DATA:  Ground-level fall with obvious head injury 2 days ago. Positive LOC. Ongoing headache.; Ground-level fall with facial injury. Hematoma and swelling over the left eyebrow and left cheek. Dried blood around the mouth; Ground-level fall with head neck trauma EXAM: CT HEAD WITHOUT CONTRAST CT MAXILLOFACIAL WITHOUT CONTRAST CT CERVICAL SPINE WITHOUT CONTRAST TECHNIQUE: Multidetector CT imaging of the head, cervical spine, and maxillofacial structures were performed using the standard protocol without intravenous contrast. Multiplanar CT image reconstructions of the cervical spine and maxillofacial structures were also generated. RADIATION DOSE REDUCTION: This exam was performed according to the departmental dose-optimization program which includes automated exposure control, adjustment of the mA and/or kV according to patient size and/or use of iterative reconstruction technique. COMPARISON:  Head CT 11/29/09 FINDINGS: CT HEAD FINDINGS Brain: No hemorrhage. No hydrocephalus. No extra-axial fluid collection. No mass effect. No mass lesion. No CT evidence of an acute cortical infarct. Vascular: No hyperdense vessel or unexpected calcification. Skull: Normal. Negative for fracture or focal lesion. Other: None. CT MAXILLOFACIAL FINDINGS  Osseous: No fracture or mandibular dislocation. No destructive process. There is a lucent lesion of the sphenoid wing on the right, unchanged compared to 11/29/2009 Orbits: Negative. No traumatic or inflammatory finding. Sinuses: No middle ear or mastoid effusion. Paranasal sinuses are clear. Soft tissues: There is soft tissue swelling in the periorbital soft tissues on the left and along left frontal scalp CT CERVICAL SPINE FINDINGS Alignment: Grade 1 anterolisthesis of C3 on C4 and C4 on C5. Skull base and vertebrae: No acute fracture. No primary bone lesion or focal pathologic process. Soft tissues and spinal canal: No prevertebral fluid or swelling. No visible canal hematoma. Disc  levels:  No CT evidence high-grade spinal canal stenosis Upper chest: Negative. Other: None IMPRESSION: 1. No CT evidence of intracranial injury. 2. No acute facial bone fracture. 3. No acute fracture or traumatic subluxation of the cervical spine. 4. Soft tissue swelling in the periorbital soft tissues on the left and along the left frontal scalp. Electronically Signed   By: Lyndall Gore M.D.   On: 02/01/2024 13:02   CT Maxillofacial Wo Contrast Result Date: 02/01/2024 CLINICAL DATA:  Ground-level fall with obvious head injury 2 days ago. Positive LOC. Ongoing headache.; Ground-level fall with facial injury. Hematoma and swelling over the left eyebrow and left cheek. Dried blood around the mouth; Ground-level fall with head neck trauma EXAM: CT HEAD WITHOUT CONTRAST CT MAXILLOFACIAL WITHOUT CONTRAST CT CERVICAL SPINE WITHOUT CONTRAST TECHNIQUE: Multidetector CT imaging of the head, cervical spine, and maxillofacial structures were performed using the standard protocol without intravenous contrast. Multiplanar CT image reconstructions of the cervical spine and maxillofacial structures were also generated. RADIATION DOSE REDUCTION: This exam was performed according to the departmental dose-optimization program which includes  automated exposure control, adjustment of the mA and/or kV according to patient size and/or use of iterative reconstruction technique. COMPARISON:  Head CT 11/29/09 FINDINGS: CT HEAD FINDINGS Brain: No hemorrhage. No hydrocephalus. No extra-axial fluid collection. No mass effect. No mass lesion. No CT evidence of an acute cortical infarct. Vascular: No hyperdense vessel or unexpected calcification. Skull: Normal. Negative for fracture or focal lesion. Other: None. CT MAXILLOFACIAL FINDINGS Osseous: No fracture or mandibular dislocation. No destructive process. There is a lucent lesion of the sphenoid wing on the right, unchanged compared to 11/29/2009 Orbits: Negative. No traumatic or inflammatory finding. Sinuses: No middle ear or mastoid effusion. Paranasal sinuses are clear. Soft tissues: There is soft tissue swelling in the periorbital soft tissues on the left and along left frontal scalp CT CERVICAL SPINE FINDINGS Alignment: Grade 1 anterolisthesis of C3 on C4 and C4 on C5. Skull base and vertebrae: No acute fracture. No primary bone lesion or focal pathologic process. Soft tissues and spinal canal: No prevertebral fluid or swelling. No visible canal hematoma. Disc levels:  No CT evidence high-grade spinal canal stenosis Upper chest: Negative. Other: None IMPRESSION: 1. No CT evidence of intracranial injury. 2. No acute facial bone fracture. 3. No acute fracture or traumatic subluxation of the cervical spine. 4. Soft tissue swelling in the periorbital soft tissues on the left and along the left frontal scalp. Electronically Signed   By: Lyndall Gore M.D.   On: 02/01/2024 13:02   CT Cervical Spine Wo Contrast Result Date: 02/01/2024 CLINICAL DATA:  Ground-level fall with obvious head injury 2 days ago. Positive LOC. Ongoing headache.; Ground-level fall with facial injury. Hematoma and swelling over the left eyebrow and left cheek. Dried blood around the mouth; Ground-level fall with head neck trauma EXAM: CT  HEAD WITHOUT CONTRAST CT MAXILLOFACIAL WITHOUT CONTRAST CT CERVICAL SPINE WITHOUT CONTRAST TECHNIQUE: Multidetector CT imaging of the head, cervical spine, and maxillofacial structures were performed using the standard protocol without intravenous contrast. Multiplanar CT image reconstructions of the cervical spine and maxillofacial structures were also generated. RADIATION DOSE REDUCTION: This exam was performed according to the departmental dose-optimization program which includes automated exposure control, adjustment of the mA and/or kV according to patient size and/or use of iterative reconstruction technique. COMPARISON:  Head CT 11/29/09 FINDINGS: CT HEAD FINDINGS Brain: No hemorrhage. No hydrocephalus. No extra-axial fluid collection. No mass effect. No mass lesion. No CT  evidence of an acute cortical infarct. Vascular: No hyperdense vessel or unexpected calcification. Skull: Normal. Negative for fracture or focal lesion. Other: None. CT MAXILLOFACIAL FINDINGS Osseous: No fracture or mandibular dislocation. No destructive process. There is a lucent lesion of the sphenoid wing on the right, unchanged compared to 11/29/2009 Orbits: Negative. No traumatic or inflammatory finding. Sinuses: No middle ear or mastoid effusion. Paranasal sinuses are clear. Soft tissues: There is soft tissue swelling in the periorbital soft tissues on the left and along left frontal scalp CT CERVICAL SPINE FINDINGS Alignment: Grade 1 anterolisthesis of C3 on C4 and C4 on C5. Skull base and vertebrae: No acute fracture. No primary bone lesion or focal pathologic process. Soft tissues and spinal canal: No prevertebral fluid or swelling. No visible canal hematoma. Disc levels:  No CT evidence high-grade spinal canal stenosis Upper chest: Negative. Other: None IMPRESSION: 1. No CT evidence of intracranial injury. 2. No acute facial bone fracture. 3. No acute fracture or traumatic subluxation of the cervical spine. 4. Soft tissue swelling  in the periorbital soft tissues on the left and along the left frontal scalp. Electronically Signed   By: Lyndall Gore M.D.   On: 02/01/2024 13:02    Scheduled Meds:  atorvastatin   40 mg Oral QHS   cyanocobalamin   1,000 mcg Oral Daily   heparin   5,000 Units Subcutaneous Q8H   ipratropium-albuterol   3 mL Nebulization QID   Continuous Infusions:  sodium chloride  100 mL/hr at 02/02/24 0130   azithromycin      cefTRIAXone  (ROCEPHIN )  IV 2 g (02/02/24 0905)     LOS: 1 day   CRITICAL CARE Total critical care time: 55 minutes Critical care was time spent personally by me on the following activities: development of treatment plan with patient and/or surrogate as well as nursing, discussions with consultants, evaluation of patient's response to treatment, examination of patient, obtaining history from patient or surrogate, ordering and performing treatments and interventions, ordering and review of laboratory studies, ordering and review of radiographic studies, pulse oximetry and re-evaluation of patient's condition.     Omar Gayden, DO [facility]  To contact the attending physician between 7A-7P please use Epic Chat. To contact the covering physician during after hours 7P-7A, please review Amion.   02/02/2024, 9:10 AM   *This document has been created with the assistance of dictation software. Please excuse typographical errors. *

## 2024-02-03 ENCOUNTER — Encounter: Payer: Self-pay | Admitting: Internal Medicine

## 2024-02-03 ENCOUNTER — Telehealth (HOSPITAL_COMMUNITY): Payer: Self-pay | Admitting: Pharmacy Technician

## 2024-02-03 ENCOUNTER — Inpatient Hospital Stay: Payer: No Typology Code available for payment source

## 2024-02-03 ENCOUNTER — Other Ambulatory Visit (HOSPITAL_COMMUNITY): Payer: Self-pay

## 2024-02-03 DIAGNOSIS — K449 Diaphragmatic hernia without obstruction or gangrene: Secondary | ICD-10-CM

## 2024-02-03 DIAGNOSIS — J189 Pneumonia, unspecified organism: Secondary | ICD-10-CM | POA: Diagnosis not present

## 2024-02-03 DIAGNOSIS — E785 Hyperlipidemia, unspecified: Secondary | ICD-10-CM | POA: Diagnosis not present

## 2024-02-03 DIAGNOSIS — J9601 Acute respiratory failure with hypoxia: Secondary | ICD-10-CM | POA: Diagnosis not present

## 2024-02-03 DIAGNOSIS — I480 Paroxysmal atrial fibrillation: Secondary | ICD-10-CM | POA: Diagnosis not present

## 2024-02-03 LAB — CBC
HCT: 40.2 % (ref 36.0–46.0)
Hemoglobin: 12.8 g/dL (ref 12.0–15.0)
MCH: 31 pg (ref 26.0–34.0)
MCHC: 31.8 g/dL (ref 30.0–36.0)
MCV: 97.3 fL (ref 80.0–100.0)
Platelets: 114 10*3/uL — ABNORMAL LOW (ref 150–400)
RBC: 4.13 MIL/uL (ref 3.87–5.11)
RDW: 15.3 % (ref 11.5–15.5)
WBC: 7.3 10*3/uL (ref 4.0–10.5)
nRBC: 0.5 % — ABNORMAL HIGH (ref 0.0–0.2)

## 2024-02-03 LAB — COMPREHENSIVE METABOLIC PANEL
ALT: 24 U/L (ref 0–44)
AST: 60 U/L — ABNORMAL HIGH (ref 15–41)
Albumin: 2.3 g/dL — ABNORMAL LOW (ref 3.5–5.0)
Alkaline Phosphatase: 27 U/L — ABNORMAL LOW (ref 38–126)
Anion gap: 9 (ref 5–15)
BUN: 33 mg/dL — ABNORMAL HIGH (ref 8–23)
CO2: 34 mmol/L — ABNORMAL HIGH (ref 22–32)
Calcium: 8.1 mg/dL — ABNORMAL LOW (ref 8.9–10.3)
Chloride: 100 mmol/L (ref 98–111)
Creatinine, Ser: 0.9 mg/dL (ref 0.44–1.00)
GFR, Estimated: >60 mL/min (ref >60)
Glucose, Bld: 141 mg/dL — ABNORMAL HIGH (ref 70–99)
Potassium: 3.3 mmol/L — ABNORMAL LOW (ref 3.5–5.1)
Sodium: 142 mmol/L (ref 135–145)
Total Bilirubin: 0.5 mg/dL (ref 0.0–1.2)
Total Protein: 5.5 g/dL — ABNORMAL LOW (ref 6.5–8.1)

## 2024-02-03 LAB — BLOOD GAS, ARTERIAL
Acid-Base Excess: 5.9 mmol/L — ABNORMAL HIGH (ref 0.0–2.0)
Acid-Base Excess: 9.1 mmol/L — ABNORMAL HIGH (ref 0.0–2.0)
Bicarbonate: 35.1 mmol/L — ABNORMAL HIGH (ref 20.0–28.0)
Bicarbonate: 36.3 mmol/L — ABNORMAL HIGH (ref 20.0–28.0)
Delivery systems: POSITIVE
Expiratory PAP: 8 cm[H2O]
FIO2: 60 %
Inspiratory PAP: 20 cm[H2O]
O2 Saturation: 98.5 %
O2 Saturation: 99.1 %
Patient temperature: 37
Patient temperature: 37
pCO2 arterial: 73 mm[Hg] (ref 32–48)
pCO2 arterial: 60 mm[Hg] — ABNORMAL HIGH (ref 32–48)
pH, Arterial: 7.29 — ABNORMAL LOW (ref 7.35–7.45)
pH, Arterial: 7.39 (ref 7.35–7.45)
pO2, Arterial: 110 mm[Hg] — ABNORMAL HIGH (ref 83–108)
pO2, Arterial: 98 mm[Hg] (ref 83–108)

## 2024-02-03 LAB — RPR: RPR Ser Ql: NONREACTIVE

## 2024-02-03 LAB — PHOSPHORUS: Phosphorus: 3.1 mg/dL (ref 2.5–4.6)

## 2024-02-03 LAB — HEPARIN LEVEL (UNFRACTIONATED)
Heparin Unfractionated: 0.75 [IU]/mL — ABNORMAL HIGH (ref 0.30–0.70)
Heparin Unfractionated: >1.1 [IU]/mL — ABNORMAL HIGH (ref 0.30–0.70)

## 2024-02-03 LAB — T4, FREE: Free T4: 0.88 ng/dL (ref 0.61–1.12)

## 2024-02-03 LAB — MAGNESIUM: Magnesium: 2.4 mg/dL (ref 1.7–2.4)

## 2024-02-03 MED ORDER — POTASSIUM CHLORIDE CRYS ER 20 MEQ PO TBCR
20.0000 meq | EXTENDED_RELEASE_TABLET | Freq: Once | ORAL | Status: AC
  Administered 2024-02-03: 20 meq via ORAL

## 2024-02-03 MED ORDER — HEPARIN (PORCINE) 25000 UT/250ML-% IV SOLN
550.0000 [IU]/h | INTRAVENOUS | Status: DC
  Administered 2024-02-03 – 2024-02-05 (×2): 550 [IU]/h via INTRAVENOUS
  Filled 2024-02-03: qty 250

## 2024-02-03 MED ORDER — METOPROLOL SUCCINATE ER 50 MG PO TB24
25.0000 mg | ORAL_TABLET | Freq: Two times a day (BID) | ORAL | Status: DC
  Administered 2024-02-03 – 2024-02-08 (×10): 25 mg via ORAL
  Filled 2024-02-03 (×12): qty 1

## 2024-02-03 MED ORDER — METHYLPREDNISOLONE SODIUM SUCC 125 MG IJ SOLR
80.0000 mg | Freq: Every day | INTRAMUSCULAR | Status: DC
  Administered 2024-02-03 – 2024-02-07 (×5): 80 mg via INTRAVENOUS
  Filled 2024-02-03 (×5): qty 2

## 2024-02-03 MED ORDER — FUROSEMIDE 10 MG/ML IJ SOLN
40.0000 mg | Freq: Once | INTRAMUSCULAR | Status: AC
  Administered 2024-02-03: 40 mg via INTRAVENOUS
  Filled 2024-02-03: qty 4

## 2024-02-03 MED ORDER — MUPIROCIN 2 % EX OINT
TOPICAL_OINTMENT | Freq: Two times a day (BID) | CUTANEOUS | Status: DC
  Administered 2024-02-08 – 2024-02-13 (×4): 1 via NASAL
  Filled 2024-02-03 (×2): qty 22

## 2024-02-03 MED ORDER — HALOPERIDOL LACTATE 5 MG/ML IJ SOLN
2.0000 mg | Freq: Once | INTRAMUSCULAR | Status: AC
  Administered 2024-02-03: 2 mg via INTRAVENOUS
  Filled 2024-02-03: qty 1

## 2024-02-03 NOTE — Consult Note (Signed)
 Pharmacy Consult Note - Anticoagulation  Pharmacy Consult for heparin  Indication: atrial fibrillation  PATIENT MEASUREMENTS: Height: 4' 9 (144.8 cm) Weight: 57.6 kg (127 lb) IBW/kg (Calculated) : 38.6 HEPARIN  DW (KG): 51.1  VITAL SIGNS: Temp: 99.5 F (37.5 C) (02/06 0515) Temp Source: Oral (02/06 0515) BP: 144/89 (02/06 0515) Pulse Rate: 71 (02/06 0515)  Recent Labs    02/01/24 1221 02/02/24 0437 02/02/24 2252 02/03/24 0550  HGB 13.9 13.0  --  12.8  HCT 41.5 40.2  --  40.2  PLT 115* 95*  --  114*  APTT 28  --   --   --   LABPROT 13.7  --   --   --   INR 1.0  --   --   --   HEPARINUNFRC  --   --    < > 0.75*  CREATININE 1.19* 1.17*  --   --    < > = values in this interval not displayed.    Estimated Creatinine Clearance: 35.9 mL/min (A) (by C-G formula based on SCr of 1.17 mg/dL (H)).  PAST MEDICAL HISTORY: Past Medical History:  Diagnosis Date   Asthma    Bicuspid aortic valve 09/20/2016   a. echo 09/19/16: EF 55-60%, GR1DD, possible bicuspid aortic valve without evidence of AS   Bronchitis    Coronary artery disease, non-occlusive    a. cath 09/21/16: ostLM to LM 20%, no evidence of aortic stenosis   Demand ischemia (HCC) 09/20/2016   Heart murmur    Hiatal hernia    History of blood transfusion    Hypertension    Persistent cough for 3 weeks or longer 05/19/2022   Ulcer     ASSESSMENT: 63 y.o. female with PMH including HTN, HFpEF, mild aortic calcification is presenting with new-onset atrial fibrillation with RVR. Patient is not on chronic anticoagulation per chart review. CHA2DS2VASc is 3 (HTN, CHF, female sex). Pharmacy has been consulted to initiate and manage heparin  intravenous infusion.  Pertinent medications: No chronic anticoagulation PTA per chart review  Goal(s) of therapy: Heparin  level 0.3 - 0.7 units/mL Monitor platelets by anticoagulation protocol: Yes   Baseline anticoagulation labs: Recent Labs    02/01/24 1221 02/02/24 0437  02/03/24 0550  APTT 28  --   --   INR 1.0  --   --   HGB 13.9 13.0 12.8  PLT 115* 95* 114*    02/05 2252 HL 0.7, therapeutic x 1 02/06 0550 HL 0.75, supratherapeutic  PLAN: Decrease heparin  infusion to 700 units/hour. Recheck HL in 6 hrs after rate change, then daily once at least two levels are consecutively therapeutic. Monitor CBC daily while on heparin  infusion.  Rankin CANDIE Dills, PharmD, [facility] 02/03/2024 6:41 AM

## 2024-02-03 NOTE — ED Notes (Signed)
 Admission MD messaged in regard to Rns concerns of pt being restless and continues to pick/pull at monitoring cords, [place] and IV lines despite safety mitts in place.

## 2024-02-03 NOTE — Progress Notes (Signed)
 PROGRESS NOTE    Michelle Barnett  FMW:[id number] DOB: 27-Aug-1961 DOA: 02/01/2024 PCP: Gretta Comer POUR, NP  Chief Complaint  Patient presents with   Respiratory Distress    Hospital Course:  Michelle Barnett is 63 y.o. female with hypertension, hyperlipidemia, iron deficiency anemia, who presents to the ED with acute hypoxia.  Patient was previously in her PCP office where she was noted to be 66% on room air, this improved on 3 L O2 but could not get higher than 88%.  EMS was called and brought the patient to the hospital.  In the ED she was found to be septic with a temperature of 100.7, respirations 20, tachycardic to 113 and hypoxic.  Labs were mostly unrevealing.  Patient underwent CT head, CT cervical spine, CT maxillofacial which revealed soft tissue swelling of the periorbital soft tissues of the left and along the left frontal scalp.  She received DuoNebs, azithromycin , ceftriaxone .  Subjective: Patient has become increasingly restless and agitated overnight.  Bedside RN reports that she has pulled out 4 different IVs.  IV team required to replace IV.  Patient had brief period where she was off of amiodarone  due to lack of IV access.  This morning we have placed her in soft wrist restraints, given 2 mg of Haldol .  She is also had increasing oxygen requirement.  Repeat chest x-ray ordered and revealing persistent effusions. On my evaluation she is drowsy but arousable.    Objective: Vitals:   02/03/24 0515 02/03/24 0730 02/03/24 0800 02/03/24 0845  BP: (!) 144/89  127/66   Pulse: 71 93 77 92  Resp: (!) 29 19 (!) 27 19  Temp: 99.5 F (37.5 C)  98.1 F (36.7 C)   TempSrc: Oral  Axillary   SpO2: 96% 98% 98% 98%  Weight:      Height:        Intake/Output Summary (Last 24 hours) at 02/03/2024 0934 Last data filed at 02/02/2024 1953 Gross per 24 hour  Intake 1286.47 ml  Output 600 ml  Net 686.47 ml   Filed Weights   02/01/24 1212  Weight: 57.6 kg    Examination: General  exam: Appears calm and comfortable, NAD  Respiratory system: tachypnea, crackles lower lung bases. Cardiovascular system:  irregular rhythm Gastrointestinal system: Abdomen is nondistended, soft and nontender.  Neuro: Drowsy but arousable, disoriented when asking questions.  Requires significant prompting Extremities: Symmetric, expected ROM Skin: dried blood around lower lip and scattered lesions in upper lips, some honey crusting appreciated, no mucosal lesions seen. Ecchymosis over left forehead and orbit Psychiatry: calm. Otherwise difficult to assess  Assessment & Plan:  Principal Problem:   Pneumonia of right lower lobe due to infectious organism Active Problems:   AKI (acute kidney injury) (HCC)   Essential hypertension   Vitamin B12 deficiency   Adjustment disorder with mixed anxiety and depressed mood   Acute hypoxic respiratory failure (HCC)   GERD (gastroesophageal reflux disease)   Mild intermittent asthma without complication   Hyperlipidemia   Hypoxia   Ecchymosis of left eye   Sepsis (HCC)   Paroxysmal atrial fibrillation (HCC)   Atrial fibrillation with RVR (HCC)     Right middle lobe pneumonia - Was on azithromycin  and ceftriaxone , now changed to doxycycline  in light of amiodarone  - Continue flutter valve and incentive spirometry - Continue supplemental O2 and wean as tolerated  Acute hypoxic respiratory failure - Multifactorial:  right middle lobe pneumonia, also some evidence of vascular congestion/pulmonary edema on chest  x-ray.  Echo does not reveal evidence of heart failure or diastolic dysfunction.  Will trial one-time dose of Lasix  today.  Pulmonary edema may be secondary to RVR yesterday - Worsening O2 requirement today.  Repeat chest x-ray: Similar to prior. - Continue supplemental O2, maintain sats above 92% -- ABG confirms respiratory acidosis with some metabolic compensation.  Have requested respiratory therapy evaluation to consider BiPAP. If not  improving will need ICU - Continuous pulse ox - Patient meant to be on anuity inhaler outpatient but is not taking.  Chronic hiatal hernia - Quite large, seen on CXR.  Likely contributing to atelectasis of left lung.  Observe for now.  Would benefit from general surgery consultation outpatient  A-fib RVR - Now resolved - Likely exacerbated by hypoxia, and breathing treatments. - On amio infusion, will continue -- TSH low, T4 WNL - Keep tight control of electrolytes - Appreciate cardiology consult - CHA2DS2-VASc: 4, currently on IV heparin  - Echo without RWMA, EF 55 to 60%.  No diastolic or valvular dysfunction noted.  Hypomagnesemia Hypophosphatemia - Replace as needed  Asthma exacerbation - Start prednisone . - Continue DuoNebs 4 times daily - Albuterol  as needed for wheezing  Acute metabolic encephalopathy - Likely superimposed on history of dementia.  Acute delirium multifactorial given she is still bedded in the ER with no windows, complicated by hypoxia, sepsis - Status post one-time dose of Haldol , now on wrist restraints.  Family members at bedside and have been educated  AKI - Presumed prerenal in setting of poor p.o. intake - Baseline creatinine appears to be 0.57 - Status post IV fluids - Creatinine improved - Avoid nephrotoxic meds - Renally dose when needed  Ecchymosis of left eye and forehead - Present on admission secondary to fall at home - No intracranial abnormalities  Perioral blood encrusted lesions - Boyfriend reports this is secondary to fall, attempted bedside cleansing today and there does appear to be some underlying honey crusting.  These lesions may have secondary infection with impetigo.  Will apply some local mupirocin , however ceftriaxone  for pneumonia as above should also be providing adequate treatment.  Hypertension - Lisinopril  and HCTZ held at time of admission for AKI. - Continue to monitor, titrate as needed  Memory loss - Patient's  boyfriend at bedside reports that she has had increasing difficulty with memory outpatient.  He recently took her to PCP for this evaluation - HIV negative, RPR nonreactive, B12 elevated, TSH low, T4 WNL, thiamine  pending - Head CT negative for acute intracranial abnormalities, may benefit from brain MRI outpatient with formal neuropsych testing.  DVT prophylaxis: Heparin  infusion   Code Status: Full Code Family Communication: none at bedside today. Family has gone home to rest after being at bedside all night. Disposition:  Status is: Inpatient Remains inpatient appropriate because: Workup ongoing, not medically cleared yet.  May eventually require discharge to SNF.  Will consult PT/OT when medically stable    Consultants:  Cardiology  Treatment Team:  Consulting Physician: Perla Evalene PARAS, MD  Procedures:  N/a  Antimicrobials:  Anti-infectives (From admission, onward)    Start     Dose/Rate Route Frequency Ordered Stop   02/03/24 1000  doxycycline  (VIBRAMYCIN ) 100 mg in sodium chloride  0.9 % 250 mL IVPB        100 mg 125 mL/hr over 120 Minutes Intravenous Every 12 hours 02/02/24 1328     02/02/24 1100  azithromycin  (ZITHROMAX ) 500 mg in sodium chloride  0.9 % 250 mL IVPB  Status:  Discontinued        500 mg 250 mL/hr over 60 Minutes Intravenous Every 24 hours 02/01/24 1402 02/02/24 1328   02/02/24 1000  cefTRIAXone  (ROCEPHIN ) 2 g in sodium chloride  0.9 % 100 mL IVPB        2 g 200 mL/hr over 30 Minutes Intravenous Every 24 hours 02/01/24 1402 02/06/24 0959   02/01/24 1230  cefTRIAXone  (ROCEPHIN ) 2 g in sodium chloride  0.9 % 100 mL IVPB        2 g 200 mL/hr over 30 Minutes Intravenous  Once 02/01/24 1218 02/01/24 1309   02/01/24 1230  azithromycin  (ZITHROMAX ) 500 mg in sodium chloride  0.9 % 250 mL IVPB        500 mg 250 mL/hr over 60 Minutes Intravenous  Once 02/01/24 1218 02/01/24 1421       Data Reviewed: I have personally reviewed following labs and imaging  studies CBC: Recent Labs  Lab 02/01/24 1221 02/02/24 0437 02/03/24 0550  WBC 8.8 9.6 7.3  NEUTROABS 7.5  --   --   HGB 13.9 13.0 12.8  HCT 41.5 40.2 40.2  MCV 92.6 96.2 97.3  PLT 115* 95* 114*   Basic Metabolic Panel: Recent Labs  Lab 02/01/24 1221 02/02/24 0437 02/02/24 1349 02/02/24 2252  NA 132* 136  --   --   K 3.5 3.5  --   --   CL 89* 99  --   --   CO2 30 28  --   --   GLUCOSE 137* 105*  --   --   BUN 71* 54*  --   --   CREATININE 1.19* 1.17*  --   --   CALCIUM  9.2 8.2*  --   --   MG  --   --  1.7  --   PHOS  --   --  1.5* 2.6   GFR: Estimated Creatinine Clearance: 35.9 mL/min (A) (by C-G formula based on SCr of 1.17 mg/dL (H)). Liver Function Tests: Recent Labs  Lab 02/01/24 1221  AST 65*  ALT 26  ALKPHOS 30*  BILITOT 0.6  PROT 6.4*  ALBUMIN 3.1*   CBG: No results for input(s): GLUCAP in the last 168 hours.  Recent Results (from the past 240 hours)  Resp panel by RT-PCR (RSV, Flu A&B, Covid) Anterior Nasal Swab     Status: None   Collection Time: 02/01/24 12:21 PM   Specimen: Anterior Nasal Swab  Result Value Ref Range Status   SARS Coronavirus 2 by RT PCR NEGATIVE NEGATIVE Final    Comment: (NOTE) SARS-CoV-2 target nucleic acids are NOT DETECTED.  The SARS-CoV-2 RNA is generally detectable in upper respiratory specimens during the acute phase of infection. The lowest concentration of SARS-CoV-2 viral copies this assay can detect is 138 copies/mL. A negative result does not preclude SARS-Cov-2 infection and should not be used as the sole basis for treatment or other patient management decisions. A negative result may occur with  improper specimen collection/handling, submission of specimen other than nasopharyngeal swab, presence of viral mutation(s) within the areas targeted by this assay, and inadequate number of viral copies(<138 copies/mL). A negative result must be combined with clinical observations, patient history, and  epidemiological information. The expected result is Negative.  Fact Sheet for Patients:  bloggercourse.com  Fact Sheet for Healthcare Providers:  seriousbroker.it  This test is no t yet approved or cleared by the [place]  FDA and  has been authorized for detection and/or diagnosis of SARS-CoV-2 by FDA  under an Emergency Use Authorization (EUA). This EUA will remain  in effect (meaning this test can be used) for the duration of the COVID-19 declaration under Section 564(b)(1) of the Act, 21 U.S.C.section 360bbb-3(b)(1), unless the authorization is terminated  or revoked sooner.       Influenza A by PCR NEGATIVE NEGATIVE Final   Influenza B by PCR NEGATIVE NEGATIVE Final    Comment: (NOTE) The Xpert Xpress SARS-CoV-2/FLU/RSV plus assay is intended as an aid in the diagnosis of influenza from Nasopharyngeal swab specimens and should not be used as a sole basis for treatment. Nasal washings and aspirates are unacceptable for Xpert Xpress SARS-CoV-2/FLU/RSV testing.  Fact Sheet for Patients: bloggercourse.com  Fact Sheet for Healthcare Providers: seriousbroker.it  This test is not yet approved or cleared by the [place]  FDA and has been authorized for detection and/or diagnosis of SARS-CoV-2 by FDA under an Emergency Use Authorization (EUA). This EUA will remain in effect (meaning this test can be used) for the duration of the COVID-19 declaration under Section 564(b)(1) of the Act, 21 U.S.C. section 360bbb-3(b)(1), unless the authorization is terminated or revoked.     Resp Syncytial Virus by PCR NEGATIVE NEGATIVE Final    Comment: (NOTE) Fact Sheet for Patients: bloggercourse.com  Fact Sheet for Healthcare Providers: seriousbroker.it  This test is not yet approved or cleared by the [place]  FDA and has been  authorized for detection and/or diagnosis of SARS-CoV-2 by FDA under an Emergency Use Authorization (EUA). This EUA will remain in effect (meaning this test can be used) for the duration of the COVID-19 declaration under Section 564(b)(1) of the Act, 21 U.S.C. section 360bbb-3(b)(1), unless the authorization is terminated or revoked.  Performed at [facility], [address] [place], [place] [address]   Blood Culture (routine x 2)     Status: None (Preliminary result)   Collection Time: 02/01/24 12:21 PM   Specimen: BLOOD  Result Value Ref Range Status   Specimen Description BLOOD LEFT ANTECUBITAL  Final   Special Requests   Final    BOTTLES DRAWN AEROBIC AND ANAEROBIC Blood Culture results may not be optimal due to an inadequate volume of blood received in culture bottles   Culture   Final    NO GROWTH 2 DAYS Performed at [facility] [address], [place], [place] [address]    Report Status PENDING  Incomplete  Blood Culture (routine x 2)     Status: None (Preliminary result)   Collection Time: 02/01/24 12:21 PM   Specimen: BLOOD  Result Value Ref Range Status   Specimen Description BLOOD BLOOD RIGHT FOREARM  Final   Special Requests   Final    BOTTLES DRAWN AEROBIC AND ANAEROBIC Blood Culture results may not be optimal due to an inadequate volume of blood received in culture bottles   Culture   Final    NO GROWTH 2 DAYS Performed at [facility], [address] [place], [place] [address]    Report Status PENDING  Incomplete     Radiology Studies: DG Chest Port 1 View Result Date: 02/03/2024 CLINICAL DATA:  63 year old female with pneumonia, hypoxia. EXAM: PORTABLE CHEST 1 VIEW COMPARISON:  Cervical spine CT and portable chest x-ray yesterday. FINDINGS: Portable AP semi upright view at 0745 hours. Similar lung volumes. Ongoing veiling opacity at both lung bases, superimposed on large chronic gastric hiatal hernia which is felt to explain the  bowel-gas lucency at the left lung base. Left hemidiaphragm is obscured.  No superimposed pneumothorax. Upper lung pulmonary vascularity appears within normal limits. No air bronchograms. No acute osseous abnormality identified. Negative visible bowel gas *CRASH* that nonobstructed visible bowel gas. IMPRESSION: Stable ventilation from yesterday. Ongoing veiling opacity at both lung bases, superimposed on large chronic gastric hiatal hernia. Differential considerations include atelectasis, effusions, infection. Electronically Signed   By: VEAR Hurst M.D.   On: 02/03/2024 08:00   ECHOCARDIOGRAM COMPLETE Result Date: 02/02/2024    ECHOCARDIOGRAM REPORT   Patient Name:   Michelle Barnett Date of Exam: 02/02/2024 Medical Rec #:  [id number]        Height:       57.0 in Accession #:    [id number]       Weight:       127.0 lb Date of Birth:  1961/04/09        BSA:          1.483 m Patient Age:    62 years         BP:           136/92 mmHg Patient Gender: F                HR:           147 bpm. Exam Location:  [facility] Procedure: 2D Echo, Cardiac Doppler and Color Doppler Indications:     Abnormal ECG R94.31  History:         Patient has prior history of Echocardiogram examinations, most                  recent 05/27/2022.  Sonographer:     Christopher Furnace Referring Phys:  [id number] LORANE POLAND Diagnosing Phys: Evalene Lunger MD IMPRESSIONS  1. Left ventricular ejection fraction, by estimation, is 55 to 60%. Left ventricular ejection fraction by PLAX is 56 %. The left ventricle has normal function. The left ventricle has no regional wall motion abnormalities. There is mild left ventricular hypertrophy. Left ventricular diastolic parameters were normal.  2. Right ventricular systolic function is normal. The right ventricular size is moderately enlarged. There is normal pulmonary artery systolic pressure. The estimated right ventricular systolic pressure is 30.0 mmHg.  3. The mitral valve is normal in structure. Moderate mitral valve  regurgitation. No evidence of mitral stenosis.  4. Tricuspid valve regurgitation is moderate.  5. The aortic valve has an indeterminant number of cusps. There is moderate calcification of the aortic valve. Aortic valve regurgitation is not visualized. Moderate aortic valve stenosis. Aortic valve mean gradient measures 22.0 mmHg.  6. The inferior vena cava is normal in size with greater than 50% respiratory variability, suggesting right atrial pressure of 3 mmHg. FINDINGS  Left Ventricle: Left ventricular ejection fraction, by estimation, is 55 to 60%. Left ventricular ejection fraction by PLAX is 56 %. The left ventricle has normal function. The left ventricle has no regional wall motion abnormalities. The left ventricular internal cavity size was normal in size. There is mild left ventricular hypertrophy. Left ventricular diastolic parameters were normal. Right Ventricle: The right ventricular size is moderately enlarged. No increase in right ventricular wall thickness. Right ventricular systolic function is normal. There is normal pulmonary artery systolic pressure. The tricuspid regurgitant velocity is 2.50 m/s, and with an assumed right atrial pressure of 5 mmHg, the estimated right ventricular systolic pressure is 30.0 mmHg. Left Atrium: Left atrial size was normal in size. Right Atrium: Right atrial size was normal in size. Pericardium: There is no evidence of pericardial effusion.  Mitral Valve: The mitral valve is normal in structure. Moderate mitral valve regurgitation. No evidence of mitral valve stenosis. MV peak gradient, 7.2 mmHg. The mean mitral valve gradient is 3.0 mmHg. Tricuspid Valve: The tricuspid valve is normal in structure. Tricuspid valve regurgitation is moderate . No evidence of tricuspid stenosis. Aortic Valve: The aortic valve has an indeterminant number of cusps. There is moderate calcification of the aortic valve. Aortic valve regurgitation is not visualized. Moderate aortic stenosis is  present. Aortic valve mean gradient measures 22.0 mmHg. Aortic valve peak gradient measures 35.5 mmHg. Aortic valve area, by VTI measures 1.71 cm. Pulmonic Valve: The pulmonic valve was normal in structure. Pulmonic valve regurgitation is not visualized. No evidence of pulmonic stenosis. Aorta: The aortic root is normal in size and structure. Venous: The inferior vena cava is normal in size with greater than 50% respiratory variability, suggesting right atrial pressure of 3 mmHg. IAS/Shunts: No atrial level shunt detected by color flow Doppler.  LEFT VENTRICLE PLAX 2D LV EF:         Left            Diastology                ventricular     LV e' medial:    5.98 cm/s                ejection        LV E/e' medial:  16.3                fraction by     LV e' lateral:   7.29 cm/s                PLAX is 56      LV E/e' lateral: 13.4                %. LVIDd:         3.20 cm LVIDs:         2.30 cm LV PW:         1.20 cm LV IVS:        1.40 cm LVOT diam:     2.00 cm LV SV:         87 LV SV Index:   59 LVOT Area:     3.14 cm  RIGHT VENTRICLE RV Basal diam:  4.50 cm RV Mid diam:    3.10 cm RV S prime:     14.10 cm/s TAPSE (M-mode): 2.5 cm LEFT ATRIUM             Index        RIGHT ATRIUM           Index LA diam:        2.70 cm 1.82 cm/m   RA Area:     13.00 cm LA Vol (A2C):   50.6 ml 34.12 ml/m  RA Volume:   33.80 ml  22.79 ml/m LA Vol (A4C):   58.9 ml 39.72 ml/m LA Biplane Vol: 55.6 ml 37.49 ml/m  AORTIC VALVE AV Area (Vmax):    1.56 cm AV Area (Vmean):   1.42 cm AV Area (VTI):     1.71 cm AV Vmax:           298.00 cm/s AV Vmean:          221.000 cm/s AV VTI:            0.511 m AV Peak Grad:  35.5 mmHg AV Mean Grad:      22.0 mmHg LVOT Vmax:         148.00 cm/s LVOT Vmean:        99.900 cm/s LVOT VTI:          0.278 m LVOT/AV VTI ratio: 0.54  AORTA Ao Root diam: 2.80 cm MITRAL VALVE               TRICUSPID VALVE MV Area (PHT): 4.21 cm    TR Peak grad:   25.0 mmHg MV Area VTI:   2.67 cm    TR Vmax:         250.00 cm/s MV Peak grad:  7.2 mmHg MV Mean grad:  3.0 mmHg    SHUNTS MV Vmax:       1.34 m/s    Systemic VTI:  0.28 m MV Vmean:      73.6 cm/s   Systemic Diam: 2.00 cm MV Decel Time: 180 msec MV E velocity: 97.50 cm/s MV A velocity: 80.70 cm/s MV E/A ratio:  1.21 Evalene Lunger MD Electronically signed by Evalene Lunger MD Signature Date/Time: 02/02/2024/4:30:33 PM    Final    DG Chest Port 1 View Result Date: 02/01/2024 CLINICAL DATA:  Questionable sepsis - evaluate for abnormality. Fall. EXAM: PORTABLE CHEST 1 VIEW COMPARISON:  09/24/2023. FINDINGS: There are heterogeneous opacities overlying the right mid lower lung zones without significant volume loss, concerning for pneumonia. There is subtle blunting of bilateral lateral costophrenic angles, which may represent trace pleural effusions. Elevated left hemidiaphragm noted. There are probable atelectatic changes at the left lung base. No pneumothorax. Stable cardio-mediastinal silhouette. No acute osseous abnormalities. The soft tissues are within normal limits. IMPRESSION: *Heterogeneous opacities overlying the right mid lower lung zones without significant volume loss, concerning for pneumonia. Follow-up to clearing is recommended. *Subtle blunting of bilateral lateral costophrenic angles may represent trace pleural effusions. Electronically Signed   By: Ree Molt M.D.   On: 02/01/2024 13:11   CT Head Wo Contrast Result Date: 02/01/2024 CLINICAL DATA:  Ground-level fall with obvious head injury 2 days ago. Positive LOC. Ongoing headache.; Ground-level fall with facial injury. Hematoma and swelling over the left eyebrow and left cheek. Dried blood around the mouth; Ground-level fall with head neck trauma EXAM: CT HEAD WITHOUT CONTRAST CT MAXILLOFACIAL WITHOUT CONTRAST CT CERVICAL SPINE WITHOUT CONTRAST TECHNIQUE: Multidetector CT imaging of the head, cervical spine, and maxillofacial structures were performed using the standard protocol without  intravenous contrast. Multiplanar CT image reconstructions of the cervical spine and maxillofacial structures were also generated. RADIATION DOSE REDUCTION: This exam was performed according to the departmental dose-optimization program which includes automated exposure control, adjustment of the mA and/or kV according to patient size and/or use of iterative reconstruction technique. COMPARISON:  Head CT 11/29/09 FINDINGS: CT HEAD FINDINGS Brain: No hemorrhage. No hydrocephalus. No extra-axial fluid collection. No mass effect. No mass lesion. No CT evidence of an acute cortical infarct. Vascular: No hyperdense vessel or unexpected calcification. Skull: Normal. Negative for fracture or focal lesion. Other: None. CT MAXILLOFACIAL FINDINGS Osseous: No fracture or mandibular dislocation. No destructive process. There is a lucent lesion of the sphenoid wing on the right, unchanged compared to 11/29/2009 Orbits: Negative. No traumatic or inflammatory finding. Sinuses: No middle ear or mastoid effusion. Paranasal sinuses are clear. Soft tissues: There is soft tissue swelling in the periorbital soft tissues on the left and along left frontal scalp CT CERVICAL SPINE FINDINGS Alignment: Grade 1 anterolisthesis of  C3 on C4 and C4 on C5. Skull base and vertebrae: No acute fracture. No primary bone lesion or focal pathologic process. Soft tissues and spinal canal: No prevertebral fluid or swelling. No visible canal hematoma. Disc levels:  No CT evidence high-grade spinal canal stenosis Upper chest: Negative. Other: None IMPRESSION: 1. No CT evidence of intracranial injury. 2. No acute facial bone fracture. 3. No acute fracture or traumatic subluxation of the cervical spine. 4. Soft tissue swelling in the periorbital soft tissues on the left and along the left frontal scalp. Electronically Signed   By: Lyndall Gore M.D.   On: 02/01/2024 13:02   CT Maxillofacial Wo Contrast Result Date: 02/01/2024 CLINICAL DATA:  Ground-level  fall with obvious head injury 2 days ago. Positive LOC. Ongoing headache.; Ground-level fall with facial injury. Hematoma and swelling over the left eyebrow and left cheek. Dried blood around the mouth; Ground-level fall with head neck trauma EXAM: CT HEAD WITHOUT CONTRAST CT MAXILLOFACIAL WITHOUT CONTRAST CT CERVICAL SPINE WITHOUT CONTRAST TECHNIQUE: Multidetector CT imaging of the head, cervical spine, and maxillofacial structures were performed using the standard protocol without intravenous contrast. Multiplanar CT image reconstructions of the cervical spine and maxillofacial structures were also generated. RADIATION DOSE REDUCTION: This exam was performed according to the departmental dose-optimization program which includes automated exposure control, adjustment of the mA and/or kV according to patient size and/or use of iterative reconstruction technique. COMPARISON:  Head CT 11/29/09 FINDINGS: CT HEAD FINDINGS Brain: No hemorrhage. No hydrocephalus. No extra-axial fluid collection. No mass effect. No mass lesion. No CT evidence of an acute cortical infarct. Vascular: No hyperdense vessel or unexpected calcification. Skull: Normal. Negative for fracture or focal lesion. Other: None. CT MAXILLOFACIAL FINDINGS Osseous: No fracture or mandibular dislocation. No destructive process. There is a lucent lesion of the sphenoid wing on the right, unchanged compared to 11/29/2009 Orbits: Negative. No traumatic or inflammatory finding. Sinuses: No middle ear or mastoid effusion. Paranasal sinuses are clear. Soft tissues: There is soft tissue swelling in the periorbital soft tissues on the left and along left frontal scalp CT CERVICAL SPINE FINDINGS Alignment: Grade 1 anterolisthesis of C3 on C4 and C4 on C5. Skull base and vertebrae: No acute fracture. No primary bone lesion or focal pathologic process. Soft tissues and spinal canal: No prevertebral fluid or swelling. No visible canal hematoma. Disc levels:  No CT  evidence high-grade spinal canal stenosis Upper chest: Negative. Other: None IMPRESSION: 1. No CT evidence of intracranial injury. 2. No acute facial bone fracture. 3. No acute fracture or traumatic subluxation of the cervical spine. 4. Soft tissue swelling in the periorbital soft tissues on the left and along the left frontal scalp. Electronically Signed   By: Lyndall Gore M.D.   On: 02/01/2024 13:02   CT Cervical Spine Wo Contrast Result Date: 02/01/2024 CLINICAL DATA:  Ground-level fall with obvious head injury 2 days ago. Positive LOC. Ongoing headache.; Ground-level fall with facial injury. Hematoma and swelling over the left eyebrow and left cheek. Dried blood around the mouth; Ground-level fall with head neck trauma EXAM: CT HEAD WITHOUT CONTRAST CT MAXILLOFACIAL WITHOUT CONTRAST CT CERVICAL SPINE WITHOUT CONTRAST TECHNIQUE: Multidetector CT imaging of the head, cervical spine, and maxillofacial structures were performed using the standard protocol without intravenous contrast. Multiplanar CT image reconstructions of the cervical spine and maxillofacial structures were also generated. RADIATION DOSE REDUCTION: This exam was performed according to the departmental dose-optimization program which includes automated exposure control, adjustment of the mA and/or  kV according to patient size and/or use of iterative reconstruction technique. COMPARISON:  Head CT 11/29/09 FINDINGS: CT HEAD FINDINGS Brain: No hemorrhage. No hydrocephalus. No extra-axial fluid collection. No mass effect. No mass lesion. No CT evidence of an acute cortical infarct. Vascular: No hyperdense vessel or unexpected calcification. Skull: Normal. Negative for fracture or focal lesion. Other: None. CT MAXILLOFACIAL FINDINGS Osseous: No fracture or mandibular dislocation. No destructive process. There is a lucent lesion of the sphenoid wing on the right, unchanged compared to 11/29/2009 Orbits: Negative. No traumatic or inflammatory finding.  Sinuses: No middle ear or mastoid effusion. Paranasal sinuses are clear. Soft tissues: There is soft tissue swelling in the periorbital soft tissues on the left and along left frontal scalp CT CERVICAL SPINE FINDINGS Alignment: Grade 1 anterolisthesis of C3 on C4 and C4 on C5. Skull base and vertebrae: No acute fracture. No primary bone lesion or focal pathologic process. Soft tissues and spinal canal: No prevertebral fluid or swelling. No visible canal hematoma. Disc levels:  No CT evidence high-grade spinal canal stenosis Upper chest: Negative. Other: None IMPRESSION: 1. No CT evidence of intracranial injury. 2. No acute facial bone fracture. 3. No acute fracture or traumatic subluxation of the cervical spine. 4. Soft tissue swelling in the periorbital soft tissues on the left and along the left frontal scalp. Electronically Signed   By: Lyndall Gore M.D.   On: 02/01/2024 13:02    Scheduled Meds:  atorvastatin   40 mg Oral QHS   cyanocobalamin   1,000 mcg Oral Daily   metoprolol  succinate  12.5 mg Oral BID   phosphorus  500 mg Oral Q4H   Continuous Infusions:  amiodarone  30 mg/hr (02/03/24 0610)   cefTRIAXone  (ROCEPHIN )  IV Stopped (02/02/24 1005)   doxycycline  (VIBRAMYCIN ) IV     heparin  700 Units/hr (02/03/24 0657)     LOS: 2 days   CRITICAL CARE Total critical care time: 55 minutes Critical care was time spent personally by me on the following activities: development of treatment plan with patient and/or surrogate as well as nursing, discussions with consultants, evaluation of patient's response to treatment, examination of patient, obtaining history from patient or surrogate, ordering and performing treatments and interventions, ordering and review of laboratory studies, ordering and review of radiographic studies, pulse oximetry and re-evaluation of patient's condition.     Brannon Levene, DO [facility]  To contact the attending physician between 7A-7P please use Epic Chat.  To contact the covering physician during after hours 7P-7A, please review Amion.   02/03/2024, 9:34 AM   *This document has been created with the assistance of dictation software. Please excuse typographical errors. *

## 2024-02-03 NOTE — ED Notes (Signed)
 Pt repositioned in bed by this RN and Industrial/product designer

## 2024-02-03 NOTE — ED Notes (Signed)
 Geradine Girt, NP at bedside

## 2024-02-03 NOTE — Progress Notes (Signed)
 Rounding Note    Patient Name: Michelle Barnett Date of Encounter: 02/03/2024  [facility] Cardiologist: None   Subjective   Patient is somnolent on exam. Increased work of breathing, on 10 L HFNC. Remains in sinus rhythm with frequent PACs.  Inpatient Medications    Scheduled Meds:  atorvastatin   40 mg Oral QHS   cyanocobalamin   1,000 mcg Oral Daily   metoprolol  succinate  12.5 mg Oral BID   phosphorus  500 mg Oral Q4H   Continuous Infusions:  amiodarone  30 mg/hr (02/03/24 0610)   cefTRIAXone  (ROCEPHIN )  IV Stopped (02/02/24 1005)   doxycycline  (VIBRAMYCIN ) IV     heparin  700 Units/hr (02/03/24 0657)   PRN Meds: acetaminophen  **OR** acetaminophen , albuterol , chlorpheniramine-HYDROcodone , guaiFENesin , hydrALAZINE , ondansetron  **OR** ondansetron  (ZOFRAN ) IV, senna-docusate   Vital Signs    Vitals:   02/03/24 0200 02/03/24 0328 02/03/24 0515 02/03/24 0730  BP: (!) 161/70 139/87 (!) 144/89   Pulse: 92 86 71 93  Resp: (!) 21 (!) 22 (!) 29 19  Temp:  99.4 F (37.4 C) 99.5 F (37.5 C)   TempSrc:  Oral Oral   SpO2: 95% 95% 96% 98%  Weight:      Height:        Intake/Output Summary (Last 24 hours) at 02/03/2024 0801 Last data filed at 02/02/2024 1953 Gross per 24 hour  Intake 1286.47 ml  Output 600 ml  Net 686.47 ml      02/01/2024   12:12 PM 02/01/2024   10:55 AM 09/24/2023    8:25 AM  Last 3 Weights  Weight (lbs) 127 lb -- 122 lb  Weight (kg) 57.607 kg -- 55.339 kg      Telemetry    Sinus rhythm with frequent PACs - Personally Reviewed  Physical Exam   GEN: Somnolent. No acute distress.   Neck: No JVD Cardiac: RRR, no murmurs, rubs, or gallops.  Respiratory: Increased work of breathing, diffuse rhonchi bilaterally GI: Soft, nontender, non-distended  MS: No edema; No deformity. Neuro:  Nonfocal  Psych: Normal affect   Labs    High Sensitivity Troponin:  No results for input(s): TROPONINIHS in the last 720 hours.   Chemistry Recent Labs   Lab 02/01/24 1221 02/02/24 0437 02/02/24 1349  NA 132* 136  --   K 3.5 3.5  --   CL 89* 99  --   CO2 30 28  --   GLUCOSE 137* 105*  --   BUN 71* 54*  --   CREATININE 1.19* 1.17*  --   CALCIUM  9.2 8.2*  --   MG  --   --  1.7  PROT 6.4*  --   --   ALBUMIN 3.1*  --   --   AST 65*  --   --   ALT 26  --   --   ALKPHOS 30*  --   --   BILITOT 0.6  --   --   GFRNONAA 51* 52*  --   ANIONGAP 13 9  --     Lipids No results for input(s): CHOL, TRIG, HDL, LABVLDL, LDLCALC, CHOLHDL in the last 168 hours.  Hematology Recent Labs  Lab 02/01/24 1221 02/02/24 0437 02/03/24 0550  WBC 8.8 9.6 7.3  RBC 4.48 4.18 4.13  HGB 13.9 13.0 12.8  HCT 41.5 40.2 40.2  MCV 92.6 96.2 97.3  MCH 31.0 31.1 31.0  MCHC 33.5 32.3 31.8  RDW 14.7 15.2 15.3  PLT 115* 95* 114*   Thyroid   Recent  Labs  Lab 02/02/24 1812  TSH 0.228*    BNPNo results for input(s): BNP, PROBNP in the last 168 hours.  DDimer No results for input(s): DDIMER in the last 168 hours.   Radiology    ECHOCARDIOGRAM COMPLETE Result Date: 02/02/2024 IMPRESSIONS  1. Left ventricular ejection fraction, by estimation, is 55 to 60%. Left ventricular ejection fraction by PLAX is 56 %. The left ventricle has normal function. The left ventricle has no regional wall motion abnormalities. There is mild left ventricular hypertrophy. Left ventricular diastolic parameters were normal.  2. Right ventricular systolic function is normal. The right ventricular size is moderately enlarged. There is normal pulmonary artery systolic pressure. The estimated right ventricular systolic pressure is 30.0 mmHg.  3. The mitral valve is normal in structure. Moderate mitral valve regurgitation. No evidence of mitral stenosis.  4. Tricuspid valve regurgitation is moderate.  5. The aortic valve has an indeterminant number of cusps. There is moderate calcification of the aortic valve. Aortic valve regurgitation is not visualized. Moderate aortic valve  stenosis. Aortic valve mean gradient measures 22.0 mmHg.  6. The inferior vena cava is normal in size with greater than 50% respiratory variability, suggesting right atrial pressure of 3 mmHg.Electronically signed by Evalene Lunger MD Signature Date/Time: 02/02/2024/4:30:33 PM    Final    DG Chest Port 1 View Result Date: 02/01/2024 IMPRESSION: *Heterogeneous opacities overlying the right mid lower lung zones without significant volume loss, concerning for pneumonia. Follow-up to clearing is recommended. *Subtle blunting of bilateral lateral costophrenic angles may represent trace pleural effusions. Electronically Signed   By: Ree Molt M.D.   On: 02/01/2024 13:11   CT Head Wo Contrast Result Date: 02/01/2024 IMPRESSION: 1. No CT evidence of intracranial injury. 2. No acute facial bone fracture. 3. No acute fracture or traumatic subluxation of the cervical spine. 4. Soft tissue swelling in the periorbital soft tissues on the left and along the left frontal scalp. Electronically Signed   By: Lyndall Gore M.D.   On: 02/01/2024 13:02   CT Maxillofacial Wo Contrast Result Date: 02/01/2024 IMPRESSION: 1. No CT evidence of intracranial injury. 2. No acute facial bone fracture. 3. No acute fracture or traumatic subluxation of the cervical spine. 4. Soft tissue swelling in the periorbital soft tissues on the left and along the left frontal scalp. Electronically Signed   By: Lyndall Gore M.D.   On: 02/01/2024 13:02   CT Cervical Spine Wo Contrast Result Date: 02/01/2024 IMPRESSION: 1. No CT evidence of intracranial injury. 2. No acute facial bone fracture. 3. No acute fracture or traumatic subluxation of the cervical spine. 4. Soft tissue swelling in the periorbital soft tissues on the left and along the left frontal scalp. Electronically Signed   By: Lyndall Gore M.D.   On: 02/01/2024 13:02    Cardiac Studies   05/27/2022 TTE 1. Left ventricular ejection fraction, by estimation, is 60 to 65%. The  left  ventricle has normal function. The left ventricle has no regional  wall motion abnormalities. There is mild left ventricular hypertrophy.  Left ventricular diastolic parameters  are consistent with Grade II diastolic dysfunction (pseudonormalization).   2. Right ventricular systolic function is normal. The right ventricular  size is normal.   3. Left atrial size was mildly dilated.   4. The mitral valve is grossly normal. Mild mitral valve regurgitation.   5. The aortic valve is calcified. Aortic valve regurgitation is not  visualized. Mild aortic valve stenosis.   6. The  inferior vena cava is normal in size with greater than 50%  respiratory variability, suggesting right atrial pressure of 3 mmHg.    09/13/2016 LHC Ost LM to LM lesion, 20 %stenosed. There is no aortic valve stenosis.   1. Mild ostial left main stenosis due to slight kinking of the vessel. Otherwise no evidence of obstructive coronary artery disease. 2. Normal left ventricular end-diastolic pressure with minimal gradient across the aortic valve. Normal ejection fraction by echo.  Patient Profile     Michelle Barnett is a 63 y.o. female with a hx of asthma/COPD, bicuspid aortic valve, non-occlusive CAD, and hypertension who is being seen for the continued evaluation of atrial fibrillation with RVR.   Assessment & Plan    Right middle lobe pneumonia Hypoxia Asthma - Presented 2/4 with cough, dyspnea, and hypoxia - CXR with evidence of pneumonia - Increased work of breathing, may need BiPAP - Management per IM  New onset atrial fibrillation with RVR - Patient's chart reviewed - no known history of atrial fibrillation - Sinus rhythm on arrival, converted to atrial fibrillation with RVR 2/5 ~1230. Started on IV amiodarone  bolus and infusion with successful conversion to sinus rhythm 2/5.  - Remains in sinus rhythm with frequent PACs - High risk for reoccurrence of atrial fibrillation in the setting of pneumonia  -  Echo 2/5 showed LVEF 55-60% without RWMA. Appears euvolemic on exam. Will hold off on diuretic in this setting and given AKI.  - CHA2DS2VASc 4, will likely be discharged with Eliquis  - Continue IV heparin  - Continue IV amiodarone  and metoprolol  succinate 12.5 mg oral BID  Acute kidney injury - Cr 1.17 yesterday, likely secondary to poor intake - Management per IM  Ecchymosis of the left eye - Present on admission secondary to fall at home  Hyperlipidemia - Most recent lipid panel 04/2023 with LDL 112 - Continue atorvastatin  40 mg daily  Essential hypertension - BP stable - Continue metoprolol  as above - PTA lisinopril -hydrochlorothiazide  on hold in the setting of AKI  For questions or updates, please contact [facility] Please consult [email or web address] for contact info under        Signed, Lesley LITTIE Maffucci, PA-C  02/03/2024, 8:01 AM

## 2024-02-03 NOTE — ED Notes (Addendum)
 RN entered pt room to obtain AM labs. Pt PIV lying on her bed fully intact. Amiodarone  medication paused as no patent IV to infuse through at this time. RN and primary RN each attempted to place new IV with no success. IV team consult placed.

## 2024-02-03 NOTE — Telephone Encounter (Signed)
 Pharmacy Patient Advocate Encounter  Received notification from [facility] that Prior Authorization for ELIQUIS  5MG  has been APPROVED from 01/04/2024 to 02/02/2025   PA #/Case ID/Reference #: [id number] KEY# [id number]

## 2024-02-03 NOTE — ED Notes (Signed)
 Pt awakened and was agitated and confused and removed cardiac monitor leads. Pt's significant other at bedside and pt redirected and calm now. Aaron Aas

## 2024-02-03 NOTE — ED Notes (Signed)
 RT called for ABG.

## 2024-02-03 NOTE — Consult Note (Signed)
 Pharmacy Consult Note - Anticoagulation  Pharmacy Consult for heparin  Indication: atrial fibrillation  PATIENT MEASUREMENTS: Height: 4' 9 (144.8 cm) Weight: 57.6 kg (127 lb) IBW/kg (Calculated) : 38.6 HEPARIN  DW (KG): 51.1  VITAL SIGNS: Temp: 98.1 F (36.7 C) (02/06 0800) Temp Source: Axillary (02/06 0800) BP: 143/77 (02/06 1530) Pulse Rate: 91 (02/06 1530)  Recent Labs    02/01/24 1221 02/02/24 0437 02/03/24 0550 02/03/24 1034 02/03/24 1415  HGB 13.9   < > 12.8  --   --   HCT 41.5   < > 40.2  --   --   PLT 115*   < > 114*  --   --   APTT 28  --   --   --   --   LABPROT 13.7  --   --   --   --   INR 1.0  --   --   --   --   HEPARINUNFRC  --    < > 0.75*  --  >1.10*  CREATININE 1.19*   < >  --  0.90  --    < > = values in this interval not displayed.    Estimated Creatinine Clearance: 46.7 mL/min (by C-G formula based on SCr of 0.9 mg/dL).  PAST MEDICAL HISTORY: Past Medical History:  Diagnosis Date   Asthma    Bicuspid aortic valve 09/20/2016   a. echo 09/19/16: EF 55-60%, GR1DD, possible bicuspid aortic valve without evidence of AS   Bronchitis    Coronary artery disease, non-occlusive    a. cath 09/21/16: ostLM to LM 20%, no evidence of aortic stenosis   Demand ischemia (HCC) 09/20/2016   Heart murmur    Hiatal hernia    History of blood transfusion    Hypertension    Persistent cough for 3 weeks or longer 05/19/2022   Ulcer     ASSESSMENT: 63 y.o. female with PMH including HTN, HFpEF, mild aortic calcification is presenting with new-onset atrial fibrillation with RVR. Patient is not on chronic anticoagulation per chart review. CHA2DS2VASc is 3 (HTN, CHF, female sex). Pharmacy has been consulted to initiate and manage heparin  intravenous infusion.  Pertinent medications: No chronic anticoagulation PTA per chart review  Goal(s) of therapy: Heparin  level 0.3 - 0.7 units/mL Monitor platelets by anticoagulation protocol: Yes   Baseline anticoagulation  labs: Recent Labs    02/01/24 1221 02/02/24 0437 02/03/24 0550  APTT 28  --   --   INR 1.0  --   --   HGB 13.9 13.0 12.8  PLT 115* 95* 114*   Date/time Results Comments 02/05 2252  HL 0.7  Therapeutic x 1 02/06 0550 HL 0.75 Supra-therapeutic 02/06 1415 HL >1.10 Supra-therapeutic @ 700 units/hr  PLAN: HOLD infusion for 1hr, then restart at 550 units/hr Recheck HL in 6 hrs after infusion re-started, then daily once at least two levels are consecutively therapeutic. Monitor CBC daily while on heparin  infusion.  Kincade Granberg Rodriguez-Guzman PharmD, BCPS 02/03/2024 4:08 PM

## 2024-02-03 NOTE — ED Notes (Signed)
 When this RN went to assess pt and draw blood pt was found to have wrist restraints on but not buckled to the bed, pt able to freely move arms but still have cuffs around wrist. Pts skin warm dry and intact. Cap refill WNL. Restraints removed from pt at this time. Pt asleep in bed. Family at bedside.

## 2024-02-03 NOTE — ED Notes (Signed)
 Entered room, pt had black stool in gown and brief.  Pt was changed, bedding changed, new gown given and pt was taken out of soft restraints during providing care.   Pt has dried blood--possible herpes? All over lips and nose.

## 2024-02-03 NOTE — ED Notes (Signed)
 Safety mitts placed on patient bilateral hands due to continuously attempting to remove PIV's and monitoring cords. Family at bedside to assist with safety and agreeable with plan and necessity of mitts at this time.

## 2024-02-03 NOTE — ED Notes (Signed)
 Non-violent soft wrist restraints applied. Pt given agitation medication. Pt tolerating well. Family member educated. VS obtained

## 2024-02-03 NOTE — ED Notes (Signed)
 Pt was cleaned, new brief placed on and pure wick placed on pt. Pt appears to be tolerating bipap well

## 2024-02-03 NOTE — ED Notes (Signed)
 Not able to get a temp on pt. Rn notified.

## 2024-02-03 NOTE — ED Notes (Signed)
 pt's oxygen started to decrease while she was on high flow--placed pt on a non-rebreather and RT was called.  RT thinks that she should be on bi-pap, especially with her ABG results earlier, so he's going to place her on bi-pap.  restraints have been removed, for now and pt does not appear to be fighting care at this time--there is a visitor at bedside, pt is close to nurse's station and a tech is nearby as well. Pt does sound extremely congested at chest and has been raised so if she coughs, she is able to clear secretions.  Dr. Leesa has been made aware of the above

## 2024-02-03 NOTE — ED Notes (Signed)
 Pt's oxygen started to decrease on high flow.  RT has been called--pt was placed on non-rebreather and is sating well at 97% at 15L.

## 2024-02-04 ENCOUNTER — Inpatient Hospital Stay: Payer: No Typology Code available for payment source

## 2024-02-04 DIAGNOSIS — I480 Paroxysmal atrial fibrillation: Secondary | ICD-10-CM

## 2024-02-04 DIAGNOSIS — N179 Acute kidney failure, unspecified: Secondary | ICD-10-CM | POA: Diagnosis not present

## 2024-02-04 DIAGNOSIS — S0512XA Contusion of eyeball and orbital tissues, left eye, initial encounter: Secondary | ICD-10-CM | POA: Diagnosis not present

## 2024-02-04 DIAGNOSIS — J189 Pneumonia, unspecified organism: Secondary | ICD-10-CM | POA: Diagnosis present

## 2024-02-04 DIAGNOSIS — J9601 Acute respiratory failure with hypoxia: Secondary | ICD-10-CM | POA: Diagnosis not present

## 2024-02-04 DIAGNOSIS — I4891 Unspecified atrial fibrillation: Secondary | ICD-10-CM | POA: Diagnosis not present

## 2024-02-04 LAB — COMPREHENSIVE METABOLIC PANEL
ALT: 23 U/L (ref 0–44)
AST: 50 U/L — ABNORMAL HIGH (ref 15–41)
Albumin: 2.3 g/dL — ABNORMAL LOW (ref 3.5–5.0)
Alkaline Phosphatase: 28 U/L — ABNORMAL LOW (ref 38–126)
Anion gap: 11 (ref 5–15)
BUN: 47 mg/dL — ABNORMAL HIGH (ref 8–23)
CO2: 32 mmol/L (ref 22–32)
Calcium: 8.1 mg/dL — ABNORMAL LOW (ref 8.9–10.3)
Chloride: 101 mmol/L (ref 98–111)
Creatinine, Ser: 1.47 mg/dL — ABNORMAL HIGH (ref 0.44–1.00)
GFR, Estimated: 40 mL/min — ABNORMAL LOW (ref >60)
Glucose, Bld: 141 mg/dL — ABNORMAL HIGH (ref 70–99)
Potassium: 3.2 mmol/L — ABNORMAL LOW (ref 3.5–5.1)
Sodium: 144 mmol/L (ref 135–145)
Total Bilirubin: 0.2 mg/dL (ref 0.0–1.2)
Total Protein: 5.5 g/dL — ABNORMAL LOW (ref 6.5–8.1)

## 2024-02-04 LAB — CBC WITH DIFFERENTIAL/PLATELET
Abs Immature Granulocytes: 0.34 10*3/uL — ABNORMAL HIGH (ref 0.00–0.07)
Basophils Absolute: 0 10*3/uL (ref 0.0–0.1)
Basophils Relative: 1 %
Eosinophils Absolute: 0.1 10*3/uL (ref 0.0–0.5)
Eosinophils Relative: 1 %
HCT: 35.4 % — ABNORMAL LOW (ref 36.0–46.0)
Hemoglobin: 11.1 g/dL — ABNORMAL LOW (ref 12.0–15.0)
Immature Granulocytes: 5 %
Lymphocytes Relative: 2 %
Lymphs Abs: 0.1 10*3/uL — ABNORMAL LOW (ref 0.7–4.0)
MCH: 31.2 pg (ref 26.0–34.0)
MCHC: 31.4 g/dL (ref 30.0–36.0)
MCV: 99.4 fL (ref 80.0–100.0)
Monocytes Absolute: 0.2 10*3/uL (ref 0.1–1.0)
Monocytes Relative: 2 %
Neutro Abs: 5.8 10*3/uL (ref 1.7–7.7)
Neutrophils Relative %: 89 %
Platelets: 142 10*3/uL — ABNORMAL LOW (ref 150–400)
RBC: 3.56 MIL/uL — ABNORMAL LOW (ref 3.87–5.11)
RDW: 15.3 % (ref 11.5–15.5)
Smear Review: NORMAL
WBC: 6.5 10*3/uL (ref 4.0–10.5)
nRBC: 2.6 % — ABNORMAL HIGH (ref 0.0–0.2)

## 2024-02-04 LAB — HEPARIN LEVEL (UNFRACTIONATED)
Heparin Unfractionated: 0.64 [IU]/mL (ref 0.30–0.70)
Heparin Unfractionated: 0.65 [IU]/mL (ref 0.30–0.70)
Heparin Unfractionated: 0.76 [IU]/mL — ABNORMAL HIGH (ref 0.30–0.70)

## 2024-02-04 LAB — GLUCOSE, CAPILLARY
Glucose-Capillary: 142 mg/dL — ABNORMAL HIGH (ref 70–99)
Glucose-Capillary: 146 mg/dL — ABNORMAL HIGH (ref 70–99)

## 2024-02-04 LAB — PHOSPHORUS: Phosphorus: 3.4 mg/dL (ref 2.5–4.6)

## 2024-02-04 LAB — PROCALCITONIN: Procalcitonin: 0.4 ng/mL

## 2024-02-04 LAB — MAGNESIUM: Magnesium: 2.3 mg/dL (ref 1.7–2.4)

## 2024-02-04 MED ORDER — IOHEXOL 350 MG/ML SOLN
75.0000 mL | Freq: Once | INTRAVENOUS | Status: AC | PRN
  Administered 2024-02-04: 65 mL via INTRAVENOUS

## 2024-02-04 MED ORDER — QUETIAPINE FUMARATE 25 MG PO TABS
12.5000 mg | ORAL_TABLET | Freq: Once | ORAL | Status: AC
  Administered 2024-02-04: 12.5 mg via ORAL
  Filled 2024-02-04: qty 1

## 2024-02-04 MED ORDER — POTASSIUM CHLORIDE CRYS ER 20 MEQ PO TBCR: 40.0000 meq | EXTENDED_RELEASE_TABLET | Freq: Once | ORAL | Status: DC

## 2024-02-04 NOTE — Consult Note (Signed)
 Pharmacy Consult Note - Anticoagulation  Pharmacy Consult for heparin  Indication: atrial fibrillation  PATIENT MEASUREMENTS: Height: 4' 9 (144.8 cm) Weight: 57.6 kg (127 lb) IBW/kg (Calculated) : 38.6 HEPARIN  DW (KG): 51.1  VITAL SIGNS: Temp: 99.8 F (37.7 C) (02/06 1739) Temp Source: Axillary (02/06 1739) BP: 111/52 (02/06 2330) Pulse Rate: 77 (02/06 2330)  Recent Labs    02/01/24 1221 02/02/24 0437 02/03/24 0550 02/03/24 1034 02/03/24 1415 02/03/24 2339  HGB 13.9   < > 12.8  --   --   --   HCT 41.5   < > 40.2  --   --   --   PLT 115*   < > 114*  --   --   --   APTT 28  --   --   --   --   --   LABPROT 13.7  --   --   --   --   --   INR 1.0  --   --   --   --   --   HEPARINUNFRC  --    < > 0.75*  --    < > 0.64  CREATININE 1.19*   < >  --  0.90  --   --    < > = values in this interval not displayed.    Estimated Creatinine Clearance: 46.7 mL/min (by C-G formula based on SCr of 0.9 mg/dL).  PAST MEDICAL HISTORY: Past Medical History:  Diagnosis Date   Asthma    Bicuspid aortic valve 09/20/2016   a. echo 09/19/16: EF 55-60%, GR1DD, possible bicuspid aortic valve without evidence of AS   Bronchitis    Coronary artery disease, non-occlusive    a. cath 09/21/16: ostLM to LM 20%, no evidence of aortic stenosis   Demand ischemia (HCC) 09/20/2016   Heart murmur    Hiatal hernia    History of blood transfusion    Hypertension    Persistent cough for 3 weeks or longer 05/19/2022   Ulcer     ASSESSMENT: 63 y.o. female with PMH including HTN, HFpEF, mild aortic calcification is presenting with new-onset atrial fibrillation with RVR. Patient is not on chronic anticoagulation per chart review. CHA2DS2VASc is 3 (HTN, CHF, female sex). Pharmacy has been consulted to initiate and manage heparin  intravenous infusion.  Pertinent medications: No chronic anticoagulation PTA per chart review  Goal(s) of therapy: Heparin  level 0.3 - 0.7 units/mL Monitor platelets by  anticoagulation protocol: Yes   Baseline anticoagulation labs: Recent Labs    02/01/24 1221 02/02/24 0437 02/03/24 0550  APTT 28  --   --   INR 1.0  --   --   HGB 13.9 13.0 12.8  PLT 115* 95* 114*   Date/time Results Comments 02/05 2252  HL 0.7  Therapeutic x 1 02/06 0550 HL 0.75 Supra-therapeutic 02/06 1415 HL >1.10 Supra-therapeutic @ 700 units/hr 02/06 2339 HL 0.64 Therapeutic x 1  PLAN: Continue heparin  infusion at 550 units/hr Recheck HL in 6 hrs to confirm, then daily once at least two levels are consecutively therapeutic. Monitor CBC daily while on heparin  infusion.  Rankin CANDIE Dills, PharmD, [facility] 02/04/2024 12:19 AM

## 2024-02-04 NOTE — ED Notes (Signed)
 Patient agitated and pulling at iv's and monitoring cords.

## 2024-02-04 NOTE — Progress Notes (Signed)
 Called by RN that patient is hypoxic and requesting to go back on BIPAP. Asked RN if she needed any help, she stated no, no further changes at this time.

## 2024-02-04 NOTE — Progress Notes (Addendum)
 PROGRESS NOTE    Michelle Barnett  FMW:[id number] DOB: 07-Jan-1961 DOA: 02/01/2024 PCP: Gretta Comer POUR, NP  Chief Complaint  Patient presents with   Respiratory Distress    Hospital Course:  Michelle Barnett is 63 y.o. female with hypertension, hyperlipidemia, iron deficiency anemia, who presents to the ED with acute hypoxia.  Patient was previously in her PCP office where she was noted to be 66% on room air, this improved on 3 L O2 but could not get higher than 88%.  EMS was called and brought the patient to the hospital.  In the ED she was found to be septic with a temperature of 100.7, respirations 20, tachycardic to 113 and hypoxic.  Labs were mostly unrevealing.  Patient underwent CT head, CT cervical spine, CT maxillofacial which revealed soft tissue swelling of the periorbital soft tissues of the left and along the left frontal scalp.  She received DuoNebs, azithromycin , ceftriaxone .  Subjective: Some intermittent hypoxia overnight requiring intermittent BiPAP.  She reports she is otherwise feeling much better this morning.  At the time of my evaluation she is more oriented than she has been on prior exams.  Less work of breathing appreciated.  She does report that she has been biting her lip while on the BiPAP.  She has some fresh blood around her lips/teeth as well as old crusting blood.  Objective: Vitals:   02/04/24 0700 02/04/24 0715 02/04/24 0730 02/04/24 0800  BP: (!) 153/72  (!) 169/82 (!) 156/85  Pulse: 80 81 81 83  Resp: (!) 22 16 14 18   Temp:      TempSrc:      SpO2: 98% 97% 96% 95%  Weight:      Height:        Intake/Output Summary (Last 24 hours) at 02/04/2024 9092 Last data filed at 02/03/2024 2000 Gross per 24 hour  Intake --  Output 800 ml  Net -800 ml   Filed Weights   02/01/24 1212  Weight: 57.6 kg    Examination: General exam: Appears calm and comfortable, NAD  Respiratory system: tachypnea, crackles lower lung bases. Cardiovascular system:   irregular rhythm Gastrointestinal system: Abdomen is nondistended, soft and nontender.  Neuro: alert and oriented today. Extremities: Symmetric, expected ROM Skin: dried blood around lower lip and scattered lesions in upper lips, some honey crusting appreciated, no mucosal lesions seen. Fresh blood on teeth, lips and hands. Ecchymosis over left forehead and orbit Psychiatry: calm.  Mood and affect appropriate.       Assessment & Plan:  Principal Problem:   Pneumonia of right lower lobe due to infectious organism Active Problems:   AKI (acute kidney injury) (HCC)   Essential hypertension   Vitamin B12 deficiency   Adjustment disorder with mixed anxiety and depressed mood   Acute hypoxic respiratory failure (HCC)   GERD (gastroesophageal reflux disease)   Mild intermittent asthma without complication   Hyperlipidemia   Hypoxia   Ecchymosis of left eye   Sepsis (HCC)   Paroxysmal atrial fibrillation (HCC)   Atrial fibrillation with RVR (HCC)   Hiatal hernia     Right middle lobe pneumonia - Was on azithromycin  and ceftriaxone , now changed to doxycycline  in light of amiodarone  - Continue flutter valve and incentive spirometry - Continue supplemental O2 and wean as tolerated  Acute hypoxic respiratory failure - Multifactorial:  right middle lobe pneumonia, also some evidence of vascular congestion/pulmonary edema on chest x-ray.  Echo does not reveal evidence of heart failure or  diastolic dysfunction.  Status post trial of Lasix , minimal improvement on chest x-ray. - Continue supplemental O2, maintain sats above 92% -- ABG 2/6 confirmed respiratory acidosis with metabolic compensation.  Patient underwent BiPAP trial and had significant improvement on repeat ABG.  May still require intermittent BiPAP today for work of breathing.  Chest x-ray mostly unchanged.   -- Patient could benefit from CT scan to better evaluate, unclear if she will be able to lie flat for scan at this time.   If respiratory status remains stable today we will attempt - Continuous pulse ox - Patient meant to be on anuity inhaler outpatient but is not taking.  Chronic hiatal hernia - Quite large, seen on CXR.  Likely contributing to atelectasis of left lung, initially obscuring additional pathology.  Observe for now.  Would benefit from general surgery consultation outpatient  A-fib RVR - Now resolved - Likely exacerbated by hypoxia, and breathing treatments. - On amio infusion, will continue -- TSH low, T4 WNL - Keep tight control of electrolytes - Appreciate cardiology consult - CHA2DS2-VASc: 4, currently on IV heparin  - Echo without RWMA, EF 55 to 60%.  No diastolic or valvular dysfunction noted.  Hypomagnesemia Hypophosphatemia - Replace as needed  Asthma exacerbation - Start prednisone . - Continue DuoNebs 4 times daily - Albuterol  as needed for wheezing  Acute metabolic encephalopathy - Likely superimposed on history of dementia.  Acute delirium multifactorial given she is still bedded in the ER with no windows, complicated by hypoxia, sepsis - Status post one-time dose of Haldol , now on wrist restraints.  Family members at bedside and have been educated. Doing better.   AKI - Presumed prerenal in setting of poor p.o. intake - Baseline creatinine appears to be 0.57 - Status post IV fluids - Creatinine improved - Avoid nephrotoxic meds - Renally dose when needed  Ecchymosis of left eye and forehead - Present on admission secondary to fall at home - No intracranial abnormalities  Perioral blood encrusted lesions - Boyfriend reports this is secondary to fall, but patient does have some fresh blood on teeth and lips today and tells me she has been biting.  attempted bedside cleansing and there does appear to be some underlying honey crusting.  These lesions may have secondary infection with impetigo.  Will apply some local mupirocin , however ceftriaxone  for pneumonia as above  should also be providing adequate treatment. No mucosal involvement, no lesions in other locations.  -- Cannot r/o VZV/HSV, PCR swabs ordered -- RIME possibility, mycoplasma IgM pending  Hypertension - Lisinopril  and HCTZ held at time of admission for AKI. - Continue to monitor, titrate as needed  Memory loss - Patient's boyfriend at bedside reports that she has had increasing difficulty with memory outpatient.  He recently took her to PCP for this evaluation - HIV negative, RPR nonreactive, B12 elevated, TSH low, T4 WNL, thiamine  pending - Head CT negative for acute intracranial abnormalities, may benefit from brain MRI outpatient with formal neuropsych testing.  DVT prophylaxis: Heparin  infusion   Code Status: Full Code Family Communication: none at bedside today. Family has gone home to rest after being at bedside all night. Disposition:  Status is: Inpatient Remains inpatient appropriate because: Workup ongoing, not medically cleared yet.  May eventually require discharge to SNF.  Will consult PT/OT when medically stable    Consultants:  Cardiology  Treatment Team:  Consulting Physician: Perla Evalene PARAS, MD  Procedures:  N/a  Antimicrobials:  Anti-infectives (From admission, onward)    Start  Dose/Rate Route Frequency Ordered Stop   02/03/24 1000  doxycycline  (VIBRAMYCIN ) 100 mg in sodium chloride  0.9 % 250 mL IVPB        100 mg 125 mL/hr over 120 Minutes Intravenous Every 12 hours 02/02/24 1328     02/02/24 1100  azithromycin  (ZITHROMAX ) 500 mg in sodium chloride  0.9 % 250 mL IVPB  Status:  Discontinued        500 mg 250 mL/hr over 60 Minutes Intravenous Every 24 hours 02/01/24 1402 02/02/24 1328   02/02/24 1000  cefTRIAXone  (ROCEPHIN ) 2 g in sodium chloride  0.9 % 100 mL IVPB        2 g 200 mL/hr over 30 Minutes Intravenous Every 24 hours 02/01/24 1402 02/06/24 0959   02/01/24 1230  cefTRIAXone  (ROCEPHIN ) 2 g in sodium chloride  0.9 % 100 mL IVPB        2 g 200  mL/hr over 30 Minutes Intravenous  Once 02/01/24 1218 02/01/24 1309   02/01/24 1230  azithromycin  (ZITHROMAX ) 500 mg in sodium chloride  0.9 % 250 mL IVPB        500 mg 250 mL/hr over 60 Minutes Intravenous  Once 02/01/24 1218 02/01/24 1421       Data Reviewed: I have personally reviewed following labs and imaging studies CBC: Recent Labs  Lab 02/01/24 1221 02/02/24 0437 02/03/24 0550 02/04/24 0555  WBC 8.8 9.6 7.3 6.5  NEUTROABS 7.5  --   --  5.8  HGB 13.9 13.0 12.8 11.1*  HCT 41.5 40.2 40.2 35.4*  MCV 92.6 96.2 97.3 99.4  PLT 115* 95* 114* 142*   Basic Metabolic Panel: Recent Labs  Lab 02/01/24 1221 02/02/24 0437 02/02/24 1349 02/02/24 2252 02/03/24 1034 02/04/24 0555  NA 132* 136  --   --  142 144  K 3.5 3.5  --   --  3.3* 3.2*  CL 89* 99  --   --  100 101  CO2 30 28  --   --  34* 32  GLUCOSE 137* 105*  --   --  141* 141*  BUN 71* 54*  --   --  33* 47*  CREATININE 1.19* 1.17*  --   --  0.90 1.47*  CALCIUM  9.2 8.2*  --   --  8.1* 8.1*  MG  --   --  1.7  --  2.4 2.3  PHOS  --   --  1.5* 2.6 3.1 3.4   GFR: Estimated Creatinine Clearance: 28.6 mL/min (A) (by C-G formula based on SCr of 1.47 mg/dL (H)). Liver Function Tests: Recent Labs  Lab 02/01/24 1221 02/03/24 1034 02/04/24 0555  AST 65* 60* 50*  ALT 26 24 23   ALKPHOS 30* 27* 28*  BILITOT 0.6 0.5 0.2  PROT 6.4* 5.5* 5.5*  ALBUMIN 3.1* 2.3* 2.3*   CBG: No results for input(s): GLUCAP in the last 168 hours.  Recent Results (from the past 240 hours)  Resp panel by RT-PCR (RSV, Flu A&B, Covid) Anterior Nasal Swab     Status: None   Collection Time: 02/01/24 12:21 PM   Specimen: Anterior Nasal Swab  Result Value Ref Range Status   SARS Coronavirus 2 by RT PCR NEGATIVE NEGATIVE Final    Comment: (NOTE) SARS-CoV-2 target nucleic acids are NOT DETECTED.  The SARS-CoV-2 RNA is generally detectable in upper respiratory specimens during the acute phase of infection. The lowest concentration of  SARS-CoV-2 viral copies this assay can detect is 138 copies/mL. A negative result does not preclude SARS-Cov-2 infection and  should not be used as the sole basis for treatment or other patient management decisions. A negative result may occur with  improper specimen collection/handling, submission of specimen other than nasopharyngeal swab, presence of viral mutation(s) within the areas targeted by this assay, and inadequate number of viral copies(<138 copies/mL). A negative result must be combined with clinical observations, patient history, and epidemiological information. The expected result is Negative.  Fact Sheet for Patients:  bloggercourse.com  Fact Sheet for Healthcare Providers:  seriousbroker.it  This test is no t yet approved or cleared by the [place]  FDA and  has been authorized for detection and/or diagnosis of SARS-CoV-2 by FDA under an Emergency Use Authorization (EUA). This EUA will remain  in effect (meaning this test can be used) for the duration of the COVID-19 declaration under Section 564(b)(1) of the Act, 21 U.S.C.section 360bbb-3(b)(1), unless the authorization is terminated  or revoked sooner.       Influenza A by PCR NEGATIVE NEGATIVE Final   Influenza B by PCR NEGATIVE NEGATIVE Final    Comment: (NOTE) The Xpert Xpress SARS-CoV-2/FLU/RSV plus assay is intended as an aid in the diagnosis of influenza from Nasopharyngeal swab specimens and should not be used as a sole basis for treatment. Nasal washings and aspirates are unacceptable for Xpert Xpress SARS-CoV-2/FLU/RSV testing.  Fact Sheet for Patients: bloggercourse.com  Fact Sheet for Healthcare Providers: seriousbroker.it  This test is not yet approved or cleared by the [place]  FDA and has been authorized for detection and/or diagnosis of SARS-CoV-2 by FDA under an Emergency Use  Authorization (EUA). This EUA will remain in effect (meaning this test can be used) for the duration of the COVID-19 declaration under Section 564(b)(1) of the Act, 21 U.S.C. section 360bbb-3(b)(1), unless the authorization is terminated or revoked.     Resp Syncytial Virus by PCR NEGATIVE NEGATIVE Final    Comment: (NOTE) Fact Sheet for Patients: bloggercourse.com  Fact Sheet for Healthcare Providers: seriousbroker.it  This test is not yet approved or cleared by the [place]  FDA and has been authorized for detection and/or diagnosis of SARS-CoV-2 by FDA under an Emergency Use Authorization (EUA). This EUA will remain in effect (meaning this test can be used) for the duration of the COVID-19 declaration under Section 564(b)(1) of the Act, 21 U.S.C. section 360bbb-3(b)(1), unless the authorization is terminated or revoked.  Performed at [facility], [address] [place], [place] [address]   Blood Culture (routine x 2)     Status: None (Preliminary result)   Collection Time: 02/01/24 12:21 PM   Specimen: BLOOD  Result Value Ref Range Status   Specimen Description BLOOD LEFT ANTECUBITAL  Final   Special Requests   Final    BOTTLES DRAWN AEROBIC AND ANAEROBIC Blood Culture results may not be optimal due to an inadequate volume of blood received in culture bottles   Culture   Final    NO GROWTH 3 DAYS Performed at [facility], [address], [place], [place] [address]    Report Status PENDING  Incomplete  Blood Culture (routine x 2)     Status: None (Preliminary result)   Collection Time: 02/01/24 12:21 PM   Specimen: BLOOD  Result Value Ref Range Status   Specimen Description BLOOD BLOOD RIGHT FOREARM  Final   Special Requests   Final    BOTTLES DRAWN AEROBIC AND ANAEROBIC Blood Culture results may not be optimal due to an inadequate volume of blood received in culture bottles  Culture   Final     NO GROWTH 3 DAYS Performed at [facility], [address] [place], [place] [address]    Report Status PENDING  Incomplete     Radiology Studies: DG Chest [facility] 2 Center 1 View Result Date: 02/04/2024 CLINICAL DATA:  63 year old female with possible pleural effusions. EXAM: PORTABLE CHEST 1 VIEW COMPARISON:  Portable chest yesterday and earlier. FINDINGS: Portable AP upright view at 0706 hours. Chronic large gastric hiatal hernia partially containing gas redemonstrated. Stable mediastinal contours. Stable lung volumes with unchanged veiling lung base opacity, more conspicuous on the right. No pneumothorax. No upper lung pulmonary edema. No definite air bronchograms. Negative visible bowel gas in the abdomen. No acute osseous abnormality identified. IMPRESSION: Ventilation not significantly changed since 02/01/2024. Veiling lung base opacity superimposed on large gastric hiatal hernia. Differential considerations remain pneumonia, atelectasis, pleural effusions. Chest PA and lateral views would be helpful when feasible. Electronically Signed   By: VEAR Hurst M.D.   On: 02/04/2024 07:22   DG Chest Port 1 View Result Date: 02/03/2024 CLINICAL DATA:  63 year old female with pneumonia, hypoxia. EXAM: PORTABLE CHEST 1 VIEW COMPARISON:  Cervical spine CT and portable chest x-ray yesterday. FINDINGS: Portable AP semi upright view at 0745 hours. Similar lung volumes. Ongoing veiling opacity at both lung bases, superimposed on large chronic gastric hiatal hernia which is felt to explain the bowel-gas lucency at the left lung base. Left hemidiaphragm is obscured. No superimposed pneumothorax. Upper lung pulmonary vascularity appears within normal limits. No air bronchograms. No acute osseous abnormality identified. Negative visible bowel gas *CRASH* that nonobstructed visible bowel gas. IMPRESSION: Stable ventilation from yesterday. Ongoing veiling opacity at both lung bases, superimposed on large chronic gastric  hiatal hernia. Differential considerations include atelectasis, effusions, infection. Electronically Signed   By: VEAR Hurst M.D.   On: 02/03/2024 08:00   ECHOCARDIOGRAM COMPLETE Result Date: 02/02/2024    ECHOCARDIOGRAM REPORT   Patient Name:   ALLYSON TINEO Date of Exam: 02/02/2024 Medical Rec #:  [id number]        Height:       57.0 in Accession #:    [id number]       Weight:       127.0 lb Date of Birth:  02-Feb-1961        BSA:          1.483 m Patient Age:    62 years         BP:           136/92 mmHg Patient Gender: F                HR:           147 bpm. Exam Location:  [facility] Procedure: 2D Echo, Cardiac Doppler and Color Doppler Indications:     Abnormal ECG R94.31  History:         Patient has prior history of Echocardiogram examinations, most                  recent 05/27/2022.  Sonographer:     Christopher Furnace Referring Phys:  [id number] LORANE POLAND Diagnosing Phys: Evalene Lunger MD IMPRESSIONS  1. Left ventricular ejection fraction, by estimation, is 55 to 60%. Left ventricular ejection fraction by PLAX is 56 %. The left ventricle has normal function. The left ventricle has no regional wall motion abnormalities. There is mild left ventricular hypertrophy. Left ventricular diastolic parameters were normal.  2. Right ventricular systolic function is normal.  The right ventricular size is moderately enlarged. There is normal pulmonary artery systolic pressure. The estimated right ventricular systolic pressure is 30.0 mmHg.  3. The mitral valve is normal in structure. Moderate mitral valve regurgitation. No evidence of mitral stenosis.  4. Tricuspid valve regurgitation is moderate.  5. The aortic valve has an indeterminant number of cusps. There is moderate calcification of the aortic valve. Aortic valve regurgitation is not visualized. Moderate aortic valve stenosis. Aortic valve mean gradient measures 22.0 mmHg.  6. The inferior vena cava is normal in size with greater than 50% respiratory variability, suggesting  right atrial pressure of 3 mmHg. FINDINGS  Left Ventricle: Left ventricular ejection fraction, by estimation, is 55 to 60%. Left ventricular ejection fraction by PLAX is 56 %. The left ventricle has normal function. The left ventricle has no regional wall motion abnormalities. The left ventricular internal cavity size was normal in size. There is mild left ventricular hypertrophy. Left ventricular diastolic parameters were normal. Right Ventricle: The right ventricular size is moderately enlarged. No increase in right ventricular wall thickness. Right ventricular systolic function is normal. There is normal pulmonary artery systolic pressure. The tricuspid regurgitant velocity is 2.50 m/s, and with an assumed right atrial pressure of 5 mmHg, the estimated right ventricular systolic pressure is 30.0 mmHg. Left Atrium: Left atrial size was normal in size. Right Atrium: Right atrial size was normal in size. Pericardium: There is no evidence of pericardial effusion. Mitral Valve: The mitral valve is normal in structure. Moderate mitral valve regurgitation. No evidence of mitral valve stenosis. MV peak gradient, 7.2 mmHg. The mean mitral valve gradient is 3.0 mmHg. Tricuspid Valve: The tricuspid valve is normal in structure. Tricuspid valve regurgitation is moderate . No evidence of tricuspid stenosis. Aortic Valve: The aortic valve has an indeterminant number of cusps. There is moderate calcification of the aortic valve. Aortic valve regurgitation is not visualized. Moderate aortic stenosis is present. Aortic valve mean gradient measures 22.0 mmHg. Aortic valve peak gradient measures 35.5 mmHg. Aortic valve area, by VTI measures 1.71 cm. Pulmonic Valve: The pulmonic valve was normal in structure. Pulmonic valve regurgitation is not visualized. No evidence of pulmonic stenosis. Aorta: The aortic root is normal in size and structure. Venous: The inferior vena cava is normal in size with greater than 50% respiratory  variability, suggesting right atrial pressure of 3 mmHg. IAS/Shunts: No atrial level shunt detected by color flow Doppler.  LEFT VENTRICLE PLAX 2D LV EF:         Left            Diastology                ventricular     LV e' medial:    5.98 cm/s                ejection        LV E/e' medial:  16.3                fraction by     LV e' lateral:   7.29 cm/s                PLAX is 56      LV E/e' lateral: 13.4                %. LVIDd:         3.20 cm LVIDs:         2.30 cm LV PW:  1.20 cm LV IVS:        1.40 cm LVOT diam:     2.00 cm LV SV:         87 LV SV Index:   59 LVOT Area:     3.14 cm  RIGHT VENTRICLE RV Basal diam:  4.50 cm RV Mid diam:    3.10 cm RV S prime:     14.10 cm/s TAPSE (M-mode): 2.5 cm LEFT ATRIUM             Index        RIGHT ATRIUM           Index LA diam:        2.70 cm 1.82 cm/m   RA Area:     13.00 cm LA Vol (A2C):   50.6 ml 34.12 ml/m  RA Volume:   33.80 ml  22.79 ml/m LA Vol (A4C):   58.9 ml 39.72 ml/m LA Biplane Vol: 55.6 ml 37.49 ml/m  AORTIC VALVE AV Area (Vmax):    1.56 cm AV Area (Vmean):   1.42 cm AV Area (VTI):     1.71 cm AV Vmax:           298.00 cm/s AV Vmean:          221.000 cm/s AV VTI:            0.511 m AV Peak Grad:      35.5 mmHg AV Mean Grad:      22.0 mmHg LVOT Vmax:         148.00 cm/s LVOT Vmean:        99.900 cm/s LVOT VTI:          0.278 m LVOT/AV VTI ratio: 0.54  AORTA Ao Root diam: 2.80 cm MITRAL VALVE               TRICUSPID VALVE MV Area (PHT): 4.21 cm    TR Peak grad:   25.0 mmHg MV Area VTI:   2.67 cm    TR Vmax:        250.00 cm/s MV Peak grad:  7.2 mmHg MV Mean grad:  3.0 mmHg    SHUNTS MV Vmax:       1.34 m/s    Systemic VTI:  0.28 m MV Vmean:      73.6 cm/s   Systemic Diam: 2.00 cm MV Decel Time: 180 msec MV E velocity: 97.50 cm/s MV A velocity: 80.70 cm/s MV E/A ratio:  1.21 Evalene Lunger MD Electronically signed by Evalene Lunger MD Signature Date/Time: 02/02/2024/4:30:33 PM    Final     Scheduled Meds:  atorvastatin   40 mg Oral QHS    cyanocobalamin   1,000 mcg Oral Daily   methylPREDNISolone  (SOLU-MEDROL ) injection  80 mg Intravenous Q1500   metoprolol  succinate  25 mg Oral BID   mupirocin  ointment   Nasal BID   Continuous Infusions:  amiodarone  30 mg/hr (02/03/24 1924)   cefTRIAXone  (ROCEPHIN )  IV Stopped (02/03/24 1116)   doxycycline  (VIBRAMYCIN ) IV Stopped (02/03/24 2333)   heparin  550 Units/hr (02/04/24 0816)     LOS: 3 days   CRITICAL CARE Total critical care time: 55 minutes Critical care was time spent personally by me on the following activities: development of treatment plan with patient and/or surrogate as well as nursing, discussions with consultants, evaluation of patient's response to treatment, examination of patient, obtaining history from patient or surrogate, ordering and performing treatments and interventions, ordering and review of laboratory studies, ordering  and review of radiographic studies, pulse oximetry and re-evaluation of patient's condition.     Daleah Coulson, DO [facility]  To contact the attending physician between 7A-7P please use Epic Chat. To contact the covering physician during after hours 7P-7A, please review Amion.   02/04/2024, 9:07 AM   *This document has been created with the assistance of dictation software. Please excuse typographical errors. *

## 2024-02-04 NOTE — ED Notes (Signed)
 IV team at bedside

## 2024-02-04 NOTE — ED Notes (Signed)
 RN informed that the pt has been moved over to HFNC at 6 lpm. Pt tolerating well at this time.

## 2024-02-04 NOTE — ED Notes (Signed)
 Pts sats on 6L HFNC 80-85% pt requesting to be placed back on bipap, respiratory called and notified. Pt placed on bipap.

## 2024-02-04 NOTE — ED Notes (Signed)
 Patient transported to CT

## 2024-02-04 NOTE — Consult Note (Signed)
 Pharmacy Consult Note - Anticoagulation  Pharmacy Consult for heparin  Indication: atrial fibrillation  PATIENT MEASUREMENTS: Height: 4' 9 (144.8 cm) Weight: 57.6 kg (127 lb) IBW/kg (Calculated) : 38.6 HEPARIN  DW (KG): 51.1  VITAL SIGNS: BP: 116/57 (02/07 0230) Pulse Rate: 81 (02/07 0444)  Recent Labs    02/01/24 1221 02/02/24 0437 02/03/24 0550 02/03/24 1034 02/04/24 0555  HGB 13.9   < > 12.8  --   --   HCT 41.5   < > 40.2  --   --   PLT 115*   < > 114*  --   --   APTT 28  --   --   --   --   LABPROT 13.7  --   --   --   --   INR 1.0  --   --   --   --   HEPARINUNFRC  --    < > 0.75*   < > 0.65  CREATININE 1.19*   < >  --    < > 1.47*   < > = values in this interval not displayed.    Estimated Creatinine Clearance: 28.6 mL/min (A) (by C-G formula based on SCr of 1.47 mg/dL (H)).  PAST MEDICAL HISTORY: Past Medical History:  Diagnosis Date   Asthma    Bicuspid aortic valve 09/20/2016   a. echo 09/19/16: EF 55-60%, GR1DD, possible bicuspid aortic valve without evidence of AS   Bronchitis    Coronary artery disease, non-occlusive    a. cath 09/21/16: ostLM to LM 20%, no evidence of aortic stenosis   Demand ischemia (HCC) 09/20/2016   Heart murmur    Hiatal hernia    History of blood transfusion    Hypertension    Persistent cough for 3 weeks or longer 05/19/2022   Ulcer     ASSESSMENT: 63 y.o. female with PMH including HTN, HFpEF, mild aortic calcification is presenting with new-onset atrial fibrillation with RVR. Patient is not on chronic anticoagulation per chart review. CHA2DS2VASc is 3 (HTN, CHF, female sex). Pharmacy has been consulted to initiate and manage heparin  intravenous infusion.  Pertinent medications: No chronic anticoagulation PTA per chart review  Goal(s) of therapy: Heparin  level 0.3 - 0.7 units/mL Monitor platelets by anticoagulation protocol: Yes   Baseline anticoagulation labs: Recent Labs    02/01/24 1221 02/02/24 0437  02/03/24 0550  APTT 28  --   --   INR 1.0  --   --   HGB 13.9 13.0 12.8  PLT 115* 95* 114*   Date/time Results Comments 02/05 2252  HL 0.7  Therapeutic x 1 02/06 0550 HL 0.75 Supra-therapeutic 02/06 1415 HL >1.10 Supra-therapeutic @ 700 units/hr 02/06 2339 HL 0.64 Therapeutic x 1 02/07 0555 HL 0.65 Therapeutic x 2  PLAN: Continue heparin  infusion at 550 units/hr Recheck HL daily w/ AM labs while therapeutic. Monitor CBC daily while on heparin  infusion.  Rankin CANDIE Dills, PharmD, [facility] 02/04/2024 6:38 AM

## 2024-02-04 NOTE — Progress Notes (Signed)
 Rounding Note    Patient Name: Michelle Barnett Date of Encounter: 02/04/2024  [facility] Cardiologist: None   Subjective   Patient reports improvements in breathing with BiPAP. Remains in sinus tachycardia. Denies chest pain and palpitations.   Inpatient Medications    Scheduled Meds:  atorvastatin   40 mg Oral QHS   cyanocobalamin   1,000 mcg Oral Daily   methylPREDNISolone  (SOLU-MEDROL ) injection  80 mg Intravenous Q1500   metoprolol  succinate  25 mg Oral BID   mupirocin  ointment   Nasal BID   Continuous Infusions:  amiodarone  30 mg/hr (02/03/24 1924)   cefTRIAXone  (ROCEPHIN )  IV Stopped (02/03/24 1116)   doxycycline  (VIBRAMYCIN ) IV Stopped (02/03/24 2333)   heparin  550 Units/hr (02/03/24 2126)   PRN Meds: acetaminophen  **OR** acetaminophen , albuterol , guaiFENesin , hydrALAZINE , ondansetron  **OR** ondansetron  (ZOFRAN ) IV, senna-docusate   Vital Signs    Vitals:   02/04/24 0230 02/04/24 0444 02/04/24 0445 02/04/24 0642  BP: (!) 116/57   (!) 163/79  Pulse: 75 81  94  Resp: 20 13  19   Temp:    98.3 F (36.8 C)  TempSrc:    Oral  SpO2: 97% 100% 100% 96%  Weight:      Height:        Intake/Output Summary (Last 24 hours) at 02/04/2024 0723 Last data filed at 02/03/2024 2000 Gross per 24 hour  Intake --  Output 800 ml  Net -800 ml      02/01/2024   12:12 PM 02/01/2024   10:55 AM 09/24/2023    8:25 AM  Last 3 Weights  Weight (lbs) 127 lb -- 122 lb  Weight (kg) 57.607 kg -- 55.339 kg      Telemetry    Sinus tachycardia with frequent PACs  - Personally Reviewed  Physical Exam   GEN: No acute distress.   Neck: No JVD Cardiac: RRR, no murmurs, rubs, or gallops.  Respiratory: On BiPAP, diffuse rhonchi bilaterally GI: Soft, nontender, non-distended  MS: No edema; No deformity. Neuro:  Nonfocal  Psych: Normal affect   Labs    High Sensitivity Troponin:  No results for input(s): TROPONINIHS in the last 720 hours.   Chemistry Recent Labs  Lab  02/01/24 1221 02/02/24 0437 02/02/24 1349 02/03/24 1034 02/04/24 0555  NA 132* 136  --  142 144  K 3.5 3.5  --  3.3* 3.2*  CL 89* 99  --  100 101  CO2 30 28  --  34* 32  GLUCOSE 137* 105*  --  141* 141*  BUN 71* 54*  --  33* 47*  CREATININE 1.19* 1.17*  --  0.90 1.47*  CALCIUM  9.2 8.2*  --  8.1* 8.1*  MG  --   --  1.7 2.4 2.3  PROT 6.4*  --   --  5.5* 5.5*  ALBUMIN 3.1*  --   --  2.3* 2.3*  AST 65*  --   --  60* 50*  ALT 26  --   --  24 23  ALKPHOS 30*  --   --  27* 28*  BILITOT 0.6  --   --  0.5 0.2  GFRNONAA 51* 52*  --  >60 40*  ANIONGAP 13 9  --  9 11    Lipids No results for input(s): CHOL, TRIG, HDL, LABVLDL, LDLCALC, CHOLHDL in the last 168 hours.  Hematology Recent Labs  Lab 02/02/24 0437 02/03/24 0550 02/04/24 0555  WBC 9.6 7.3 6.5  RBC 4.18 4.13 3.56*  HGB 13.0 12.8  11.1*  HCT 40.2 40.2 35.4*  MCV 96.2 97.3 99.4  MCH 31.1 31.0 31.2  MCHC 32.3 31.8 31.4  RDW 15.2 15.3 15.3  PLT 95* 114* 142*   Thyroid   Recent Labs  Lab 02/02/24 1812 02/03/24 1034  TSH 0.228*  --   FREET4  --  0.88    BNPNo results for input(s): BNP, PROBNP in the last 168 hours.  DDimer No results for input(s): DDIMER in the last 168 hours.   Radiology    ECHOCARDIOGRAM COMPLETE Result Date: 02/02/2024 IMPRESSIONS  1. Left ventricular ejection fraction, by estimation, is 55 to 60%. Left ventricular ejection fraction by PLAX is 56 %. The left ventricle has normal function. The left ventricle has no regional wall motion abnormalities. There is mild left ventricular hypertrophy. Left ventricular diastolic parameters were normal.  2. Right ventricular systolic function is normal. The right ventricular size is moderately enlarged. There is normal pulmonary artery systolic pressure. The estimated right ventricular systolic pressure is 30.0 mmHg.  3. The mitral valve is normal in structure. Moderate mitral valve regurgitation. No evidence of mitral stenosis.  4. Tricuspid  valve regurgitation is moderate.  5. The aortic valve has an indeterminant number of cusps. There is moderate calcification of the aortic valve. Aortic valve regurgitation is not visualized. Moderate aortic valve stenosis. Aortic valve mean gradient measures 22.0 mmHg.  6. The inferior vena cava is normal in size with greater than 50% respiratory variability, suggesting right atrial pressure of 3 mmHg.Electronically signed by Evalene Lunger MD Signature Date/Time: 02/02/2024/4:30:33 PM    Final    DG Chest Port 1 View Result Date: 02/01/2024 IMPRESSION: *Heterogeneous opacities overlying the right mid lower lung zones without significant volume loss, concerning for pneumonia. Follow-up to clearing is recommended. *Subtle blunting of bilateral lateral costophrenic angles may represent trace pleural effusions. Electronically Signed   By: Ree Molt M.D.   On: 02/01/2024 13:11   CT Head Wo Contrast Result Date: 02/01/2024 IMPRESSION: 1. No CT evidence of intracranial injury. 2. No acute facial bone fracture. 3. No acute fracture or traumatic subluxation of the cervical spine. 4. Soft tissue swelling in the periorbital soft tissues on the left and along the left frontal scalp. Electronically Signed   By: Lyndall Gore M.D.   On: 02/01/2024 13:02   CT Maxillofacial Wo Contrast Result Date: 02/01/2024 IMPRESSION: 1. No CT evidence of intracranial injury. 2. No acute facial bone fracture. 3. No acute fracture or traumatic subluxation of the cervical spine. 4. Soft tissue swelling in the periorbital soft tissues on the left and along the left frontal scalp. Electronically Signed   By: Lyndall Gore M.D.   On: 02/01/2024 13:02   CT Cervical Spine Wo Contrast Result Date: 02/01/2024 IMPRESSION: 1. No CT evidence of intracranial injury. 2. No acute facial bone fracture. 3. No acute fracture or traumatic subluxation of the cervical spine. 4. Soft tissue swelling in the periorbital soft tissues on the left and along  the left frontal scalp. Electronically Signed   By: Lyndall Gore M.D.   On: 02/01/2024 13:02    Cardiac Studies   05/27/2022 TTE 1. Left ventricular ejection fraction, by estimation, is 60 to 65%. The  left ventricle has normal function. The left ventricle has no regional  wall motion abnormalities. There is mild left ventricular hypertrophy.  Left ventricular diastolic parameters  are consistent with Grade II diastolic dysfunction (pseudonormalization).   2. Right ventricular systolic function is normal. The right ventricular  size  is normal.   3. Left atrial size was mildly dilated.   4. The mitral valve is grossly normal. Mild mitral valve regurgitation.   5. The aortic valve is calcified. Aortic valve regurgitation is not  visualized. Mild aortic valve stenosis.   6. The inferior vena cava is normal in size with greater than 50%  respiratory variability, suggesting right atrial pressure of 3 mmHg.    09/13/2016 LHC Ost LM to LM lesion, 20 %stenosed. There is no aortic valve stenosis.   1. Mild ostial left main stenosis due to slight kinking of the vessel. Otherwise no evidence of obstructive coronary artery disease. 2. Normal left ventricular end-diastolic pressure with minimal gradient across the aortic valve. Normal ejection fraction by echo.  Patient Profile     MOLLYANN HALBERT is a 63 y.o. female with a hx of asthma/COPD, bicuspid aortic valve, non-occlusive CAD, and hypertension who is being seen for the continued evaluation of atrial fibrillation with RVR.   Assessment & Plan    Right middle lobe pneumonia Hypoxia Asthma - Presented 2/4 with cough, dyspnea, and hypoxia - CXR with evidence of pneumonia - Currently requiring BiPAP  - Management per IM  New onset atrial fibrillation with RVR - Patient's chart reviewed - no known history of atrial fibrillation - Sinus rhythm on arrival, converted to atrial fibrillation with RVR 2/5 ~1230. Started on IV amiodarone   bolus and infusion with successful conversion to sinus rhythm 2/5.  - Remains in sinus tachycardia with frequent PACs - High risk for reoccurrence of atrial fibrillation in the setting of pneumonia  - Echo 2/5 showed LVEF 55-60% without RWMA - CHA2DS2VASc 4, will likely be discharged with Eliquis  - Continue IV heparin  - Continue IV amiodarone  and metoprolol  succinate 12.5 mg oral BID (unable to receive oral medication at this time due to BiPAP, resume as able)  Acute kidney injury - Cr 1.47, up from yesterday - Management per IM  Ecchymosis of the left eye - Present on admission secondary to fall at home  Hyperlipidemia - Most recent lipid panel 04/2023 with LDL 112 - Continue atorvastatin  40 mg daily (unable to receive oral medication at this time due to BiPAP, resume as able)  Essential hypertension - BP stable - Continue metoprolol  as above  - PTA lisinopril -hydrochlorothiazide  on hold in the setting of AKI  For questions or updates, please contact [facility] Please consult [email or web address] for contact info under        Signed, Lesley LITTIE Maffucci, PA-C  02/04/2024, 7:23 AM

## 2024-02-04 NOTE — Consult Note (Addendum)
 NAME: Michelle Barnett  DOB: 28-May-1961  MRN: [id number]  Date/Time: 02/04/2024 4:15 PM  REQUESTING PROVIDER: Dr.Dezii Subjective:  REASON FOR CONSULT: skin lesions around lip ?No history available from patient , partner at bed side, he is also a limited historian Michelle Barnett is a 63 y.o. with a history of HTN, IDA  COPD,  , Presents with fall on Sunday night and was hallucinating, had hematoma left eye, abraded lips, has chronic cough wbnt to her PCP's office on 02/01/24 and pulse ox was low (66%)  and she was sent to ED Had a hematoma above left eye, bruising around left eye Vitals in the ED 100.7, RR20, pulse 113, BP 110/62 Labs revealed Na 132, BUN 71, Cr 1.19, WBC 8.8, HB 13.9, PLT 115 Lactic acid 1 Ct head - no acute findings Blood culture sent Started on ceftriaxone  and azithromycin  for possible pneumonia EKG- RBBB and then Afib Started IV amiodarone  Given IV lasix  without much improvement I am asked to see her for the lip lesions whether it could be RIME, mycoplasma related skin and mucous lesions   Past Medical History:  Diagnosis Date   Asthma    Bicuspid aortic valve 09/20/2016   a. echo 09/19/16: EF 55-60%, GR1DD, possible bicuspid aortic valve without evidence of AS   Bronchitis    Coronary artery disease, non-occlusive    a. cath 09/21/16: ostLM to LM 20%, no evidence of aortic stenosis   Demand ischemia (HCC) 09/20/2016   Heart murmur    Hiatal hernia    History of blood transfusion    Hypertension    Persistent cough for 3 weeks or longer 05/19/2022   Ulcer     Past Surgical History:  Procedure Laterality Date   abdominal tumor     ABLATION     CARDIAC CATHETERIZATION N/A 09/21/2016   Procedure: Left Heart Cath and Coronary Angiography;  Surgeon: Deatrice DELENA Cage, MD;  Location: [facility] INVASIVE CV LAB;  Service: Cardiovascular;  Laterality: N/A;   COLONOSCOPY N/A 08/14/2016   Procedure: COLONOSCOPY;  Surgeon: Victory LITTIE Legrand DOUGLAS, MD;  Location: [facility] ENDOSCOPY;   Service: Endoscopy;  Laterality: N/A;   ESOPHAGOGASTRODUODENOSCOPY N/A 08/13/2016   Procedure: ESOPHAGOGASTRODUODENOSCOPY (EGD);  Surgeon: Victory LITTIE Legrand DOUGLAS, MD;  Location: [facility] ENDOSCOPY;  Service: Gastroenterology;  Laterality: N/A;   TOOTH EXTRACTION      Social History   Socioeconomic History   Marital status: Single    Spouse name: Not on file   Number of children: Not on file   Years of education: Not on file   Highest education level: Not on file  Occupational History   Not on file  Tobacco Use   Smoking status: Never   Smokeless tobacco: Never  Substance and Sexual Activity   Alcohol use: No   Drug use: No   Sexual activity: Not Currently  Other Topics Concern   Not on file  Social History Narrative   Not on file   Social Drivers of Health   Financial Resource Strain: Not on file  Food Insecurity: No Food Insecurity (02/03/2024)   Hunger Vital Sign    Worried About Running Out of Food in the Last Year: Never true    Ran Out of Food in the Last Year: Never true  Transportation Needs: No Transportation Needs (02/03/2024)   PRAPARE - Administrator, Civil Service (Medical): No    Lack of Transportation (Non-Medical): No  Physical Activity: Not on file  Stress: Not on  file  Social Connections: Moderately Integrated (02/03/2024)   Social Connection and Isolation Panel [NHANES]    Frequency of Communication with Friends and Family: Twice a week    Frequency of Social Gatherings with Friends and Family: Once a week    Attends Religious Services: 1 to 4 times per year    Active Member of [employer] or Organizations: No    Attends Banker Meetings: Never    Marital Status: Living with partner  Intimate Partner Violence: Not At Risk (02/03/2024)   Humiliation, Afraid, Rape, and Kick questionnaire    Fear of Current or Ex-Partner: No    Emotionally Abused: No    Physically Abused: No    Sexually Abused: No    Family History  Problem Relation Age of Onset    Stroke Mother    Hypertension Mother    Hypertension Father    Heart disease Father    Hypertension Brother    Breast cancer Maternal Aunt    Allergies  Allergen Reactions   Amlodipine  Rash   Aspirin Anaphylaxis   Dairy Aid [Tilactase] Swelling and Other (See Comments)    Any dairy products   Benadryl [Diphenhydramine] Palpitations   Penicillins Other (See Comments)    Reaction: Unknown   I? Current Facility-Administered Medications  Medication Dose Route Frequency Provider Last Rate Last Admin   acetaminophen  (TYLENOL ) tablet 650 mg  650 mg Oral Q6H PRN Cox, Amy N, DO   650 mg at 02/01/24 2317   Or   acetaminophen  (TYLENOL ) suppository 650 mg  650 mg Rectal Q6H PRN Cox, Amy N, DO       albuterol  (PROVENTIL ) (2.5 MG/3ML) 0.083% nebulizer solution 2.5 mg  2.5 mg Inhalation Q6H PRN Cox, Amy N, DO       amiodarone  (NEXTERONE  PREMIX) 360-4.14 MG/200ML-% (1.8 mg/mL) IV infusion  30 mg/hr Intravenous Continuous Dezii, Alexandra, DO 16.67 mL/hr at 02/04/24 1202 30 mg/hr at 02/04/24 1202   atorvastatin  (LIPITOR) tablet 40 mg  40 mg Oral QHS Cox, Amy N, DO   40 mg at 02/02/24 2040   cefTRIAXone  (ROCEPHIN ) 2 g in sodium chloride  0.9 % 100 mL IVPB  2 g Intravenous Q24H Cox, Amy N, DO   Stopped at 02/04/24 1158   cyanocobalamin  (VITAMIN B12) tablet 1,000 mcg  1,000 mcg Oral Daily Cox, Amy N, DO   1,000 mcg at 02/03/24 1139   doxycycline  (VIBRAMYCIN ) 100 mg in sodium chloride  0.9 % 250 mL IVPB  100 mg Intravenous Q12H Dezii, Alexandra, DO   Stopped at 02/04/24 1403   heparin  ADULT infusion 100 units/mL (25000 units/250mL)  550 Units/hr Intravenous Continuous Dezii, Alexandra, DO 5.5 mL/hr at 02/04/24 0816 550 Units/hr at 02/04/24 0816   hydrALAZINE  (APRESOLINE ) injection 5 mg  5 mg Intravenous Q6H PRN Cox, Amy N, DO   5 mg at 02/03/24 1807   methylPREDNISolone  sodium succinate (SOLU-MEDROL ) 125 mg/2 mL injection 80 mg  80 mg Intravenous Q1500 Dezii, Alexandra, DO   80 mg at 02/04/24 1427    metoprolol  succinate (TOPROL -XL) 24 hr tablet 25 mg  25 mg Oral BID Gollan, Timothy J, MD   25 mg at 02/03/24 1116   mupirocin  ointment (BACTROBAN ) 2 %   Nasal BID Dezii, Alexandra, DO   Given at 02/04/24 1122   ondansetron  (ZOFRAN ) tablet 4 mg  4 mg Oral Q6H PRN Cox, Amy N, DO       Or   ondansetron  (ZOFRAN ) injection 4 mg  4 mg Intravenous Q6H PRN Cox,  Amy N, DO       potassium chloride  SA (KLOR-CON  M) CR tablet 40 mEq  40 mEq Oral Once Dezii, Alexandra, DO       senna-docusate (Senokot-S) tablet 1 tablet  1 tablet Oral QHS PRN Cox, Amy N, DO       Current Outpatient Medications  Medication Sig Dispense Refill   albuterol  (VENTOLIN  HFA) 108 (90 Base) MCG/ACT inhaler INHALE 2 PUFFS BY MOUTH EVERY 6 HOURS AS NEEDED FOR WHEEZING FOR SHORTNESS OF BREATH 9 g 0   Ascorbic Acid  (VITAMIN C ) 1000 MG tablet Take 1,000 mg by mouth 2 (two) times daily.      atorvastatin  (LIPITOR) 40 MG tablet TAKE 1 TABLET BY MOUTH ONCE DAILY FOR CHOLESTEROL 90 tablet 3   ferrous sulfate  325 (65 FE) MG tablet Take 650 mg by mouth 2 (two) times daily.     Fluticasone  Furoate (ARNUITY ELLIPTA ) 100 MCG/ACT AEPB Inhale 1 puff into the lungs daily. 90 each 3   lisinopril -hydrochlorothiazide  (ZESTORETIC ) 20-25 MG tablet Take 1 tablet by mouth once daily for blood pressure 90 tablet 0   magnesium  oxide (MAG-OX) 400 MG tablet Take 400 mg by mouth daily.     Multiple Vitamins-Calcium  (ONE-A-DAY WOMENS PO) Take 1 tablet by mouth daily.     Omega-3 Fatty Acids (FISH OIL) 1200 MG CAPS Take 1 capsule by mouth daily.      vitamin B-12 (CYANOCOBALAMIN ) 1000 MCG tablet Take 1,000 mcg by mouth daily.     benzonatate  (TESSALON ) 200 MG capsule Take 1 capsule (200 mg total) by mouth 3 (three) times daily as needed for cough. (Patient not taking: Reported on 02/01/2024) 15 capsule 0     Abtx:  Anti-infectives (From admission, onward)    Start     Dose/Rate Route Frequency Ordered Stop   02/03/24 1000  doxycycline  (VIBRAMYCIN ) 100 mg in  sodium chloride  0.9 % 250 mL IVPB        100 mg 125 mL/hr over 120 Minutes Intravenous Every 12 hours 02/02/24 1328     02/02/24 1100  azithromycin  (ZITHROMAX ) 500 mg in sodium chloride  0.9 % 250 mL IVPB  Status:  Discontinued        500 mg 250 mL/hr over 60 Minutes Intravenous Every 24 hours 02/01/24 1402 02/02/24 1328   02/02/24 1000  cefTRIAXone  (ROCEPHIN ) 2 g in sodium chloride  0.9 % 100 mL IVPB        2 g 200 mL/hr over 30 Minutes Intravenous Every 24 hours 02/01/24 1402 02/06/24 0959   02/01/24 1230  cefTRIAXone  (ROCEPHIN ) 2 g in sodium chloride  0.9 % 100 mL IVPB        2 g 200 mL/hr over 30 Minutes Intravenous  Once 02/01/24 1218 02/01/24 1309   02/01/24 1230  azithromycin  (ZITHROMAX ) 500 mg in sodium chloride  0.9 % 250 mL IVPB        500 mg 250 mL/hr over 60 Minutes Intravenous  Once 02/01/24 1218 02/01/24 1421       REVIEW OF SYSTEMS:  Na because of her mental status and resp status Objective:  VITALS:  BP (!) 161/81   Pulse 91   Temp 97.8 F (36.6 C) (Oral)   Resp (!) 27   Ht 4' 9 (1.448 m)   Wt 57.6 kg   SpO2 97%   BMI 27.48 kg/m   PHYSICAL EXAM:  General: oriented in person, [place] Shortness of breath Head: Normocephalic, without obvious abnormality, atraumatic. Eyes: hematoma left frontal area Ecchymosis around eye ENT  crusted and scabbed lesions around the lip- bleeds when the scab is removed  Neck:  symmetrical, no adenopathy, thyroid : non tender no carotid bruit and no JVD. Back: No CVA tenderness. Lungs:b/l arhonchi B/ crepts bases. Heart: ireegular Abdomen: Soft, non-tender,not distended. Bowel sounds normal. No masses Extremities: atraumatic, no cyanosis. No edema. No clubbing Skin: No rashes or lesions. Or bruising Lymph: Cervical, supraclavicular normal. Neurologic: cannot be assessed Pertinent Labs Lab Results CBC    Component Value Date/Time   WBC 6.5 02/04/2024 0555   RBC 3.56 (L) 02/04/2024 0555   HGB 11.1 (L) 02/04/2024 0555    HGB 13.9 12/17/2014 1311   HCT 35.4 (L) 02/04/2024 0555   HCT 26.7 (L) 08/12/2016 0816   PLT 142 (L) 02/04/2024 0555   PLT 204 12/17/2014 1311   MCV 99.4 02/04/2024 0555   MCV 95 12/17/2014 1311   MCH 31.2 02/04/2024 0555   MCHC 31.4 02/04/2024 0555   RDW 15.3 02/04/2024 0555   RDW 14.6 (H) 12/17/2014 1311   LYMPHSABS 0.1 (L) 02/04/2024 0555   LYMPHSABS 0.9 (L) 05/11/2014 1324   MONOABS 0.2 02/04/2024 0555   MONOABS 0.5 05/11/2014 1324   EOSABS 0.1 02/04/2024 0555   EOSABS 0.1 05/11/2014 1324   BASOSABS 0.0 02/04/2024 0555   BASOSABS 0.0 05/11/2014 1324       Latest Ref Rng & Units 02/04/2024    5:55 AM 02/03/2024   10:34 AM 02/02/2024    4:37 AM  CMP  Glucose 70 - 99 mg/dL 858  858  894   BUN 8 - 23 mg/dL 47  33  54   Creatinine 0.44 - 1.00 mg/dL 8.52  9.09  8.82   Sodium 135 - 145 mmol/L 144  142  136   Potassium 3.5 - 5.1 mmol/L 3.2  3.3  3.5   Chloride 98 - 111 mmol/L 101  100  99   CO2 22 - 32 mmol/L 32  34  28   Calcium  8.9 - 10.3 mg/dL 8.1  8.1  8.2   Total Protein 6.5 - 8.1 g/dL 5.5  5.5    Total Bilirubin 0.0 - 1.2 mg/dL 0.2  0.5    Alkaline Phos 38 - 126 U/L 28  27    AST 15 - 41 U/L 50  60    ALT 0 - 44 U/L 23  24        Microbiology: Recent Results (from the past 240 hours)  Resp panel by RT-PCR (RSV, Flu A&B, Covid) Anterior Nasal Swab     Status: None   Collection Time: 02/01/24 12:21 PM   Specimen: Anterior Nasal Swab  Result Value Ref Range Status   SARS Coronavirus 2 by RT PCR NEGATIVE NEGATIVE Final    Comment: (NOTE) SARS-CoV-2 target nucleic acids are NOT DETECTED.  The SARS-CoV-2 RNA is generally detectable in upper respiratory specimens during the acute phase of infection. The lowest concentration of SARS-CoV-2 viral copies this assay can detect is 138 copies/mL. A negative result does not preclude SARS-Cov-2 infection and should not be used as the sole basis for treatment or other patient management decisions. A negative result may occur  with  improper specimen collection/handling, submission of specimen other than nasopharyngeal swab, presence of viral mutation(s) within the areas targeted by this assay, and inadequate number of viral copies(<138 copies/mL). A negative result must be combined with clinical observations, patient history, and epidemiological information. The expected result is Negative.  Fact Sheet for Patients:  bloggercourse.com  Fact Sheet  for Healthcare Providers:  seriousbroker.it  This test is no t yet approved or cleared by the [place]  FDA and  has been authorized for detection and/or diagnosis of SARS-CoV-2 by FDA under an Emergency Use Authorization (EUA). This EUA will remain  in effect (meaning this test can be used) for the duration of the COVID-19 declaration under Section 564(b)(1) of the Act, 21 U.S.C.section 360bbb-3(b)(1), unless the authorization is terminated  or revoked sooner.       Influenza A by PCR NEGATIVE NEGATIVE Final   Influenza B by PCR NEGATIVE NEGATIVE Final    Comment: (NOTE) The Xpert Xpress SARS-CoV-2/FLU/RSV plus assay is intended as an aid in the diagnosis of influenza from Nasopharyngeal swab specimens and should not be used as a sole basis for treatment. Nasal washings and aspirates are unacceptable for Xpert Xpress SARS-CoV-2/FLU/RSV testing.  Fact Sheet for Patients: bloggercourse.com  Fact Sheet for Healthcare Providers: seriousbroker.it  This test is not yet approved or cleared by the [place]  FDA and has been authorized for detection and/or diagnosis of SARS-CoV-2 by FDA under an Emergency Use Authorization (EUA). This EUA will remain in effect (meaning this test can be used) for the duration of the COVID-19 declaration under Section 564(b)(1) of the Act, 21 U.S.C. section 360bbb-3(b)(1), unless the authorization is terminated  or revoked.     Resp Syncytial Virus by PCR NEGATIVE NEGATIVE Final    Comment: (NOTE) Fact Sheet for Patients: bloggercourse.com  Fact Sheet for Healthcare Providers: seriousbroker.it  This test is not yet approved or cleared by the [place]  FDA and has been authorized for detection and/or diagnosis of SARS-CoV-2 by FDA under an Emergency Use Authorization (EUA). This EUA will remain in effect (meaning this test can be used) for the duration of the COVID-19 declaration under Section 564(b)(1) of the Act, 21 U.S.C. section 360bbb-3(b)(1), unless the authorization is terminated or revoked.  Performed at [facility], [address] [place], [place] [address]   Blood Culture (routine x 2)     Status: None (Preliminary result)   Collection Time: 02/01/24 12:21 PM   Specimen: BLOOD  Result Value Ref Range Status   Specimen Description BLOOD LEFT ANTECUBITAL  Final   Special Requests   Final    BOTTLES DRAWN AEROBIC AND ANAEROBIC Blood Culture results may not be optimal due to an inadequate volume of blood received in culture bottles   Culture   Final    NO GROWTH 3 DAYS Performed at [facility], [address] [place], [place] [address]    Report Status PENDING  Incomplete  Blood Culture (routine x 2)     Status: None (Preliminary result)   Collection Time: 02/01/24 12:21 PM   Specimen: BLOOD  Result Value Ref Range Status   Specimen Description BLOOD BLOOD RIGHT FOREARM  Final   Special Requests   Final    BOTTLES DRAWN AEROBIC AND ANAEROBIC Blood Culture results may not be optimal due to an inadequate volume of blood received in culture bottles   Culture   Final    NO GROWTH 3 DAYS Performed at [facility], [address] [place], [place] [address]    Report Status PENDING  Incomplete    IMAGING RESULTS:  I have personally reviewed the films ?  B/l lower lobe  infiltrates Impression/Recommendation ? 63 yr female presenting with fall, sob X 1 day duration and not feeling well for a few days  ?Acute hypoxic resp failure with hypercapnia- COPD  exacerbation/ Community Acquired Pneumonia/Chf On ceftriaxone  and Doxy Send urine legionella   Fall with ecchymosis left eye Hematoma  Encephalopathy- metabolic  Scabbed lesions around lips- no oral mucosa involved No skin rash No other mucosa involved ? HSV ?? Staph/strep infection Questioning Mycoplasma associated mucosal lesions HSV DNA, aerobic culture sent Recommend resp viral 20 pathogen PCR  Afib with RVR on amiodarone   Thrombocytopenia improving  AKI   ? I have personally spent  75---minutes involved in face-to-face and non-face-to-face activities for this patient on the day of the visit. Professional time spent includes the following activities: Preparing to see the patient (review of tests), Obtaining and/or reviewing separately obtained history (admission/discharge record), Performing a medically appropriate examination and/or evaluation , Ordering medications/tests/procedures, referring and communicating with other health care professionals, Documenting clinical information in the EMR, Independently interpreting results (not separately reported), Communicating results to the patient/family/caregiver, Counseling and educating the patient/family/caregiver and Care coordination (not separately reported).    ________________________________________________ Discussed with partner , nurse and , requesting provider Note:  This document was prepared using Dragon voice recognition software and may include unintentional dictation errors.

## 2024-02-04 NOTE — ED Notes (Signed)
 Patient's usable iv for antibiotics, pulled out by pt. Iv consult to be put in by this RN

## 2024-02-05 DIAGNOSIS — E876 Hypokalemia: Secondary | ICD-10-CM

## 2024-02-05 DIAGNOSIS — I4891 Unspecified atrial fibrillation: Secondary | ICD-10-CM | POA: Diagnosis not present

## 2024-02-05 DIAGNOSIS — J189 Pneumonia, unspecified organism: Secondary | ICD-10-CM | POA: Diagnosis not present

## 2024-02-05 LAB — CBC WITH DIFFERENTIAL/PLATELET
Abs Immature Granulocytes: 0.29 10*3/uL — ABNORMAL HIGH (ref 0.00–0.07)
Basophils Absolute: 0 10*3/uL (ref 0.0–0.1)
Basophils Relative: 0 %
Eosinophils Absolute: 0 10*3/uL (ref 0.0–0.5)
Eosinophils Relative: 0 %
HCT: 33.8 % — ABNORMAL LOW (ref 36.0–46.0)
Hemoglobin: 10.8 g/dL — ABNORMAL LOW (ref 12.0–15.0)
Immature Granulocytes: 5 %
Lymphocytes Relative: 4 %
Lymphs Abs: 0.3 10*3/uL — ABNORMAL LOW (ref 0.7–4.0)
MCH: 30.7 pg (ref 26.0–34.0)
MCHC: 32 g/dL (ref 30.0–36.0)
MCV: 96 fL (ref 80.0–100.0)
Monocytes Absolute: 0.5 10*3/uL (ref 0.1–1.0)
Monocytes Relative: 8 %
Neutro Abs: 5.1 10*3/uL (ref 1.7–7.7)
Neutrophils Relative %: 83 %
Platelets: 159 10*3/uL (ref 150–400)
RBC: 3.52 MIL/uL — ABNORMAL LOW (ref 3.87–5.11)
RDW: 15 % (ref 11.5–15.5)
Smear Review: NORMAL
WBC: 6.1 10*3/uL (ref 4.0–10.5)
nRBC: 0 % (ref 0.0–0.2)

## 2024-02-05 LAB — COMPREHENSIVE METABOLIC PANEL
ALT: 23 U/L (ref 0–44)
AST: 46 U/L — ABNORMAL HIGH (ref 15–41)
Albumin: 2.5 g/dL — ABNORMAL LOW (ref 3.5–5.0)
Alkaline Phosphatase: 28 U/L — ABNORMAL LOW (ref 38–126)
Anion gap: 9 (ref 5–15)
BUN: 50 mg/dL — ABNORMAL HIGH (ref 8–23)
CO2: 31 mmol/L (ref 22–32)
Calcium: 8.3 mg/dL — ABNORMAL LOW (ref 8.9–10.3)
Chloride: 101 mmol/L (ref 98–111)
Creatinine, Ser: 1.41 mg/dL — ABNORMAL HIGH (ref 0.44–1.00)
GFR, Estimated: 42 mL/min — ABNORMAL LOW (ref >60)
Glucose, Bld: 135 mg/dL — ABNORMAL HIGH (ref 70–99)
Potassium: 3 mmol/L — ABNORMAL LOW (ref 3.5–5.1)
Sodium: 141 mmol/L (ref 135–145)
Total Bilirubin: 0.8 mg/dL (ref 0.0–1.2)
Total Protein: 5.6 g/dL — ABNORMAL LOW (ref 6.5–8.1)

## 2024-02-05 LAB — HSV 1/2 PCR (SURFACE)
HSV-1 DNA: DETECTED — AB
HSV-2 DNA: NOT DETECTED

## 2024-02-05 LAB — MAGNESIUM: Magnesium: 2.4 mg/dL (ref 1.7–2.4)

## 2024-02-05 LAB — PHOSPHORUS: Phosphorus: 2.6 mg/dL (ref 2.5–4.6)

## 2024-02-05 LAB — HEPARIN LEVEL (UNFRACTIONATED): Heparin Unfractionated: 0.43 [IU]/mL (ref 0.30–0.70)

## 2024-02-05 MED ORDER — QUETIAPINE FUMARATE 25 MG PO TABS
12.5000 mg | ORAL_TABLET | Freq: Every day | ORAL | Status: DC
  Administered 2024-02-05 – 2024-02-08 (×4): 12.5 mg via ORAL
  Filled 2024-02-05 (×5): qty 1

## 2024-02-05 MED ORDER — POTASSIUM CHLORIDE CRYS ER 20 MEQ PO TBCR
40.0000 meq | EXTENDED_RELEASE_TABLET | Freq: Two times a day (BID) | ORAL | Status: AC
  Administered 2024-02-05 (×2): 40 meq via ORAL
  Filled 2024-02-05 (×2): qty 2

## 2024-02-05 MED ORDER — FUROSEMIDE 10 MG/ML IJ SOLN
40.0000 mg | Freq: Once | INTRAMUSCULAR | Status: AC
  Administered 2024-02-05: 40 mg via INTRAVENOUS
  Filled 2024-02-05: qty 4

## 2024-02-05 NOTE — Plan of Care (Signed)
  Problem: Safety: Goal: Non-violent Restraint(s) Outcome: Progressing   Problem: Education: Goal: Knowledge of General Education information will improve Description: Including pain rating scale, medication(s)/side effects and non-pharmacologic comfort measures Outcome: Progressing   Problem: Health Behavior/Discharge Planning: Goal: Ability to manage health-related needs will improve Outcome: Progressing   Problem: Clinical Measurements: Goal: Ability to maintain clinical measurements within normal limits will improve Outcome: Progressing Goal: Will remain free from infection Outcome: Progressing Goal: Diagnostic test results will improve Outcome: Progressing Goal: Respiratory complications will improve Outcome: Progressing Goal: Cardiovascular complication will be avoided Outcome: Progressing   Problem: Activity: Goal: Risk for activity intolerance will decrease Outcome: Progressing   Problem: Nutrition: Goal: Adequate nutrition will be maintained Outcome: Progressing   Problem: Coping: Goal: Level of anxiety will decrease Outcome: Progressing   Problem: Elimination: Goal: Will not experience complications related to bowel motility Outcome: Progressing Goal: Will not experience complications related to urinary retention Outcome: Progressing   Problem: Pain Managment: Goal: General experience of comfort will improve and/or be controlled Outcome: Progressing   Problem: Safety: Goal: Ability to remain free from injury will improve Outcome: Progressing   Problem: Skin Integrity: Goal: Risk for impaired skin integrity will decrease Outcome: Progressing   Problem: Activity: Goal: Ability to tolerate increased activity will improve Outcome: Progressing   Problem: Clinical Measurements: Goal: Ability to maintain a body temperature in the normal range will improve Outcome: Progressing   Problem: Respiratory: Goal: Ability to maintain adequate ventilation will  improve Outcome: Progressing Goal: Ability to maintain a clear airway will improve Outcome: Progressing

## 2024-02-05 NOTE — Progress Notes (Signed)
 Another consult was placed to the IV Nurse for more iv access, as the pt has pulled out another iv;  per family member at the bedside , she's pulled out 6 ivs now;  pt currently has 2 ivs in the left arm, and now another iv has been placed in the right forearm;  strongly suggest wrist restraints for this pt; apparently she has still been able to pull ivs out when she has mittens on.  RN and family member at bedside during iv restart

## 2024-02-05 NOTE — Progress Notes (Signed)
 PROGRESS NOTE    Michelle Barnett  FMW:[id number] DOB: 1961/04/27 DOA: 02/01/2024 PCP: Gretta Comer POUR, NP  Chief Complaint  Patient presents with   Respiratory Distress    Hospital Course:  Michelle Barnett is 63 y.o. female with hypertension, hyperlipidemia, iron deficiency anemia, who presents to the ED with acute hypoxia.  Patient was previously in her PCP office where she was noted to be 66% on room air, this improved on 3 L O2 but could not get higher than 88%.  EMS was called and brought the patient to the hospital.  In the ED she was found to be septic with a temperature of 100.7, respirations 20, tachycardic to 113 and hypoxic.  Labs were mostly unrevealing.  Patient underwent CT head, CT cervical spine, CT maxillofacial which revealed soft tissue swelling of the periorbital soft tissues of the left and along the left frontal scalp.  She received DuoNebs, azithromycin , ceftriaxone .  Subjective: Pt sleeping on arrival today. Afib RVR overnight. Did not require bipap.  Objective: Vitals:   02/04/24 2330 02/05/24 0319 02/05/24 0403 02/05/24 0808  BP: 136/77 (!) 164/88 (!) 154/79 (!) 141/80  Pulse: 80 80 81 82  Resp: 18 20 20  (!) 24  Temp: 98 F (36.7 C) 97.6 F (36.4 C) 97.9 F (36.6 C) 97.6 F (36.4 C)  TempSrc: Oral Oral Oral Oral  SpO2: 98% 97% 99% 99%  Weight:      Height:        Intake/Output Summary (Last 24 hours) at 02/05/2024 0938 Last data filed at 02/05/2024 0423 Gross per 24 hour  Intake 946.52 ml  Output --  Net 946.52 ml   Filed Weights   02/01/24 1212  Weight: 57.6 kg    Examination: General exam: Appears calm and comfortable, NAD  Respiratory system: tachypnea, crackles lower lung bases. Cardiovascular system:  irregular rhythm Gastrointestinal system: Abdomen is nondistended, soft and nontender.  Neuro: alert and oriented today. Extremities: Symmetric, expected ROM Skin: dried blood around lower lip and scattered lesions in upper lips, no  mucosal lesions seen. Biting lips on arrival. Lesions are improved from prior. Ecchymosis and small lac. over left forehead and orbit Psychiatry: calm.  Mood and affect appropriate.     Assessment & Plan:  Principal Problem:   Pneumonia of right lower lobe due to infectious organism Active Problems:   AKI (acute kidney injury) (HCC)   Essential hypertension   Vitamin B12 deficiency   Adjustment disorder with mixed anxiety and depressed mood   Acute hypoxic respiratory failure (HCC)   GERD (gastroesophageal reflux disease)   Mild intermittent asthma without complication   Hyperlipidemia   Hypoxia   Ecchymosis of left eye   Sepsis (HCC)   Paroxysmal atrial fibrillation (HCC)   Atrial fibrillation with RVR (HCC)   Hiatal hernia   Pneumonia     Right middle lobe pneumonia - Was on azithromycin  and ceftriaxone , now changed to doxycycline  in light of amiodarone  - Procalcitonin 0.4 - CTA small pleural effusions and pneumonia. - Continue flutter valve and incentive spirometry - Continue supplemental O2 and wean as tolerated  Acute hypoxic respiratory failure - Multifactorial:  right middle lobe pneumonia, also some evidence of vascular congestion/pulmonary edema on chest x-ray.  Echo does not reveal evidence of heart failure or diastolic dysfunction.  Status post trial of Lasix , minimal improvement on chest x-ray. Given persistent effusions will try 40mg  IVP again today and monitor for improvement. Intermittent RVR may be contributing - CTA small pleural effusions  and pneumonia. - Continue supplemental O2, maintain sats above 92% -- ABG 2/6 confirmed respiratory acidosis with metabolic compensation.  Patient underwent BiPAP trial and had significant improvement on repeat ABG. - Currently on high flow nasal cannula, will continue to wean as tolerated.  Intermittent BiPAP as needed - Continuous pulse ox - Patient meant to be on anuity inhaler outpatient but is not taking.  Chronic  hiatal hernia - Quite large, seen on CXR.  Likely contributing to atelectasis of left lung, initially obscuring additional pathology.  Observe for now.  Would benefit from general surgery consultation outpatient  A-fib RVR - Intermittent - Likely exacerbated by hypoxia, and breathing treatments. - On amio infusion, will continue until consistent PO intake and respiratory status improves. -- TSH low, T4 WNL - Keep tight control of electrolytes - Appreciate cardiology consult - CHA2DS2-VASc: 4, currently on IV heparin  - Echo without RWMA, EF 55 to 60%.  No diastolic or valvular dysfunction noted.  Hypomagnesemia Hypophosphatemia - Replace as needed  Asthma exacerbation - Start prednisone . - Continue DuoNebs 4 times daily - Albuterol  as needed for wheezing  Acute metabolic encephalopathy - Likely superimposed on history of dementia.  Acute delirium multifactorial given she is still bedded in the ER with no windows, complicated by hypoxia, sepsis - Status post one-time dose of Haldol , scheduled low dose seroquel , now on wrist restraints.  Family members at bedside and have been educated. Doing better.   AKI - Presumed prerenal in setting of poor p.o. intake - Baseline creatinine appears to be 0.57 - Status post IV fluids - Creatinine improved - Avoid nephrotoxic meds - Renally dose when needed  Ecchymosis of left eye and forehead - Present on admission secondary to fall at home - No intracranial abnormalities  Perioral blood encrusted lesions - Boyfriend reports this is secondary to fall, but have been getting worse since admission -- Pt does appear to be biting these areas and she has been on heparin , which is certainly exacerbating the oozing -- After attempted bedside cleansing there does appear to be some underlying honey crusting.  These lesions may have secondary infection with impetigo. Lesions do appear to be improving with local mupirocin . Ceftriaxone  for pneumonia as  above should also be providing adequate treatment. No mucosal involvement, no lesions in other locations.  -- Cannot r/o VZV/HSV, PCR swabs ordered and pending -- RIME possibility, mycoplasma IgM pending - Gram stain negative, aerobic culture pending ---HIV negative  Hypertension - Lisinopril  and HCTZ held at time of admission for AKI. - Continue to monitor, titrate as needed  Memory loss, intermittent delirium - Patient's boyfriend at bedside reports that she has had increasing difficulty with memory outpatient.  He recently took her to PCP for this evaluation - HIV negative, RPR nonreactive, B12 elevated, TSH low, T4 WNL, thiamine  pending - Head CT negative for acute intracranial abnormalities, may benefit from brain MRI outpatient with formal neuropsych testing. - Continue with frequent reorientation, delirium precautions - Scheduled low-dose Seroquel  in the morning, as needed Haldol .  Wrist restraints as needed as patient has continuously taken out IVs during this admission and does not have consistent p.o. intake  DVT prophylaxis: Heparin  infusion   Code Status: Full Code Family Communication: none at bedside today. Family has gone home to rest after being at bedside all night. Disposition:  Status is: Inpatient Remains inpatient appropriate because: Workup ongoing, not medically cleared yet.  May eventually require discharge to SNF.  Will consult PT/OT when medically stable  Consultants:  Cardiology  Treatment Team:  Consulting Physician: Perla Evalene PARAS, MD  Procedures:  N/a  Antimicrobials:  Anti-infectives (From admission, onward)    Start     Dose/Rate Route Frequency Ordered Stop   02/03/24 1000  doxycycline  (VIBRAMYCIN ) 100 mg in sodium chloride  0.9 % 250 mL IVPB        100 mg 125 mL/hr over 120 Minutes Intravenous Every 12 hours 02/02/24 1328     02/02/24 1100  azithromycin  (ZITHROMAX ) 500 mg in sodium chloride  0.9 % 250 mL IVPB  Status:  Discontinued         500 mg 250 mL/hr over 60 Minutes Intravenous Every 24 hours 02/01/24 1402 02/02/24 1328   02/02/24 1000  cefTRIAXone  (ROCEPHIN ) 2 g in sodium chloride  0.9 % 100 mL IVPB        2 g 200 mL/hr over 30 Minutes Intravenous Every 24 hours 02/01/24 1402 02/06/24 0959   02/01/24 1230  cefTRIAXone  (ROCEPHIN ) 2 g in sodium chloride  0.9 % 100 mL IVPB        2 g 200 mL/hr over 30 Minutes Intravenous  Once 02/01/24 1218 02/01/24 1309   02/01/24 1230  azithromycin  (ZITHROMAX ) 500 mg in sodium chloride  0.9 % 250 mL IVPB        500 mg 250 mL/hr over 60 Minutes Intravenous  Once 02/01/24 1218 02/01/24 1421       Data Reviewed: I have personally reviewed following labs and imaging studies CBC: Recent Labs  Lab 02/01/24 1221 02/02/24 0437 02/03/24 0550 02/04/24 0555 02/05/24 0432  WBC 8.8 9.6 7.3 6.5 6.1  NEUTROABS 7.5  --   --  5.8 5.1  HGB 13.9 13.0 12.8 11.1* 10.8*  HCT 41.5 40.2 40.2 35.4* 33.8*  MCV 92.6 96.2 97.3 99.4 96.0  PLT 115* 95* 114* 142* 159   Basic Metabolic Panel: Recent Labs  Lab 02/01/24 1221 02/02/24 0437 02/02/24 1349 02/02/24 2252 02/03/24 1034 02/04/24 0555 02/05/24 0432  NA 132* 136  --   --  142 144 141  K 3.5 3.5  --   --  3.3* 3.2* 3.0*  CL 89* 99  --   --  100 101 101  CO2 30 28  --   --  34* 32 31  GLUCOSE 137* 105*  --   --  141* 141* 135*  BUN 71* 54*  --   --  33* 47* 50*  CREATININE 1.19* 1.17*  --   --  0.90 1.47* 1.41*  CALCIUM  9.2 8.2*  --   --  8.1* 8.1* 8.3*  MG  --   --  1.7  --  2.4 2.3 2.4  PHOS  --   --  1.5* 2.6 3.1 3.4 2.6   GFR: Estimated Creatinine Clearance: 29.8 mL/min (A) (by C-G formula based on SCr of 1.41 mg/dL (H)). Liver Function Tests: Recent Labs  Lab 02/01/24 1221 02/03/24 1034 02/04/24 0555 02/05/24 0432  AST 65* 60* 50* 46*  ALT 26 24 23 23   ALKPHOS 30* 27* 28* 28*  BILITOT 0.6 0.5 0.2 0.8  PROT 6.4* 5.5* 5.5* 5.6*  ALBUMIN 3.1* 2.3* 2.3* 2.5*   CBG: Recent Labs  Lab 02/04/24 2054 02/04/24 2334  GLUCAP  146* 142*    Recent Results (from the past 240 hours)  Resp panel by RT-PCR (RSV, Flu A&B, Covid) Anterior Nasal Swab     Status: None   Collection Time: 02/01/24 12:21 PM   Specimen: Anterior Nasal Swab  Result Value Ref Range  Status   SARS Coronavirus 2 by RT PCR NEGATIVE NEGATIVE Final    Comment: (NOTE) SARS-CoV-2 target nucleic acids are NOT DETECTED.  The SARS-CoV-2 RNA is generally detectable in upper respiratory specimens during the acute phase of infection. The lowest concentration of SARS-CoV-2 viral copies this assay can detect is 138 copies/mL. A negative result does not preclude SARS-Cov-2 infection and should not be used as the sole basis for treatment or other patient management decisions. A negative result may occur with  improper specimen collection/handling, submission of specimen other than nasopharyngeal swab, presence of viral mutation(s) within the areas targeted by this assay, and inadequate number of viral copies(<138 copies/mL). A negative result must be combined with clinical observations, patient history, and epidemiological information. The expected result is Negative.  Fact Sheet for Patients:  bloggercourse.com  Fact Sheet for Healthcare Providers:  seriousbroker.it  This test is no t yet approved or cleared by the [place]  FDA and  has been authorized for detection and/or diagnosis of SARS-CoV-2 by FDA under an Emergency Use Authorization (EUA). This EUA will remain  in effect (meaning this test can be used) for the duration of the COVID-19 declaration under Section 564(b)(1) of the Act, 21 U.S.C.section 360bbb-3(b)(1), unless the authorization is terminated  or revoked sooner.       Influenza A by PCR NEGATIVE NEGATIVE Final   Influenza B by PCR NEGATIVE NEGATIVE Final    Comment: (NOTE) The Xpert Xpress SARS-CoV-2/FLU/RSV plus assay is intended as an aid in the diagnosis of influenza  from Nasopharyngeal swab specimens and should not be used as a sole basis for treatment. Nasal washings and aspirates are unacceptable for Xpert Xpress SARS-CoV-2/FLU/RSV testing.  Fact Sheet for Patients: bloggercourse.com  Fact Sheet for Healthcare Providers: seriousbroker.it  This test is not yet approved or cleared by the [place]  FDA and has been authorized for detection and/or diagnosis of SARS-CoV-2 by FDA under an Emergency Use Authorization (EUA). This EUA will remain in effect (meaning this test can be used) for the duration of the COVID-19 declaration under Section 564(b)(1) of the Act, 21 U.S.C. section 360bbb-3(b)(1), unless the authorization is terminated or revoked.     Resp Syncytial Virus by PCR NEGATIVE NEGATIVE Final    Comment: (NOTE) Fact Sheet for Patients: bloggercourse.com  Fact Sheet for Healthcare Providers: seriousbroker.it  This test is not yet approved or cleared by the [place]  FDA and has been authorized for detection and/or diagnosis of SARS-CoV-2 by FDA under an Emergency Use Authorization (EUA). This EUA will remain in effect (meaning this test can be used) for the duration of the COVID-19 declaration under Section 564(b)(1) of the Act, 21 U.S.C. section 360bbb-3(b)(1), unless the authorization is terminated or revoked.  Performed at [facility], [address] [place], [place] [address]   Blood Culture (routine x 2)     Status: None (Preliminary result)   Collection Time: 02/01/24 12:21 PM   Specimen: BLOOD  Result Value Ref Range Status   Specimen Description BLOOD LEFT ANTECUBITAL  Final   Special Requests   Final    BOTTLES DRAWN AEROBIC AND ANAEROBIC Blood Culture results may not be optimal due to an inadequate volume of blood received in culture bottles   Culture   Final    NO GROWTH 4 DAYS Performed at [facility], [address] [place], [place] [address]    Report Status PENDING  Incomplete  Blood Culture (routine x 2)     Status:  None (Preliminary result)   Collection Time: 02/01/24 12:21 PM   Specimen: BLOOD  Result Value Ref Range Status   Specimen Description BLOOD BLOOD RIGHT FOREARM  Final   Special Requests   Final    BOTTLES DRAWN AEROBIC AND ANAEROBIC Blood Culture results may not be optimal due to an inadequate volume of blood received in culture bottles   Culture   Final    NO GROWTH 4 DAYS Performed at [facility], [address], [place], [place] [address]    Report Status PENDING  Incomplete  Aerobic Culture w Gram Stain (superficial specimen)     Status: None (Preliminary result)   Collection Time: 02/04/24  5:10 PM   Specimen: Wound  Result Value Ref Range Status   Specimen Description   Final    WOUND Performed at [facility], [address], [place], [place] [address]    Special Requests   Final    LIP Performed at [facility], [address] [place], [place] [address]    Gram Stain   Final    NO WBC SEEN RARE GRAM POSITIVE RODS Performed at [facility], [address] [place], [place] [address]    Culture PENDING  Incomplete   Report Status PENDING  Incomplete     Radiology Studies: CT Angio Chest Pulmonary Embolism (PE) W or WO Contrast Result Date: 02/04/2024 CLINICAL DATA:  Pulmonary embolism (PE) suspected, high prob. Acute hypoxia. EXAM: CT ANGIOGRAPHY CHEST WITH CONTRAST TECHNIQUE: Multidetector CT imaging of the chest was performed using the standard protocol during bolus administration of intravenous contrast. Multiplanar CT image reconstructions and MIPs were obtained to evaluate the vascular anatomy. RADIATION DOSE REDUCTION: This exam was performed according to the departmental dose-optimization program which includes automated exposure control, adjustment of the mA and/or kV according to patient size  and/or use of iterative reconstruction technique. CONTRAST:  65mL OMNIPAQUE  IOHEXOL  350 MG/ML SOLN COMPARISON:  10/21/2011 FINDINGS: Cardiovascular: No filling defects in the pulmonary arteries to suggest pulmonary emboli. Heart is normal size. Aorta is normal caliber. Scattered coronary artery and aortic calcifications. Mediastinum/Nodes: Mildly prominent mediastinal lymph nodes. Right paratracheal lymph node has a short axis diameter of 1.3 cm. No axillary or hilar adenopathy. Trachea and esophagus are unremarkable. Thyroid  unremarkable. Large hiatal hernia noted containing most of the stomach as well as transverse colon. Lungs/Pleura: Small bilateral pleural effusions. Consolidation in both lower lobes concerning for pneumonia. Upper Abdomen: No acute findings Musculoskeletal: Chest wall soft tissues are unremarkable. No acute bony abnormality. Review of the MIP images confirms the above findings. IMPRESSION: No evidence of pulmonary embolus. Dense consolidation throughout much of the lower lobes most compatible with pneumonia. Small bilateral pleural effusions. Large hiatal hernia containing much of the stomach and transverse colon. Mild mediastinal adenopathy, likely reactive. Aortic Atherosclerosis (ICD10-I70.0). Electronically Signed   By: Franky Crease M.D.   On: 02/04/2024 18:17   DG Chest Port 1 View Result Date: 02/04/2024 CLINICAL DATA:  63 year old female with possible pleural effusions. EXAM: PORTABLE CHEST 1 VIEW COMPARISON:  Portable chest yesterday and earlier. FINDINGS: Portable AP upright view at 0706 hours. Chronic large gastric hiatal hernia partially containing gas redemonstrated. Stable mediastinal contours. Stable lung volumes with unchanged veiling lung base opacity, more conspicuous on the right. No pneumothorax. No upper lung pulmonary edema. No definite air bronchograms. Negative visible bowel gas in the abdomen. No acute osseous abnormality identified. IMPRESSION: Ventilation not  significantly changed since 02/01/2024. Veiling lung base opacity  superimposed on large gastric hiatal hernia. Differential considerations remain pneumonia, atelectasis, pleural effusions. Chest PA and lateral views would be helpful when feasible. Electronically Signed   By: VEAR Hurst M.D.   On: 02/04/2024 07:22    Scheduled Meds:  atorvastatin   40 mg Oral QHS   cyanocobalamin   1,000 mcg Oral Daily   methylPREDNISolone  (SOLU-MEDROL ) injection  80 mg Intravenous Q1500   metoprolol  succinate  25 mg Oral BID   mupirocin  ointment   Nasal BID   potassium chloride   40 mEq Oral Once   QUEtiapine   12.5 mg Oral Daily   Continuous Infusions:  amiodarone  30 mg/hr (02/05/24 0015)   cefTRIAXone  (ROCEPHIN )  IV Stopped (02/04/24 1158)   doxycycline  (VIBRAMYCIN ) IV 100 mg (02/04/24 2155)   heparin  550 Units/hr (02/04/24 1709)     LOS: 4 days   CRITICAL CARE Total critical care time: 55 minutes Critical care was time spent personally by me on the following activities: development of treatment plan with patient and/or surrogate as well as nursing, discussions with consultants, evaluation of patient's response to treatment, examination of patient, obtaining history from patient or surrogate, ordering and performing treatments and interventions, ordering and review of laboratory studies, ordering and review of radiographic studies, pulse oximetry and re-evaluation of patient's condition.     Jakson Delpilar, DO [facility]  To contact the attending physician between 7A-7P please use Epic Chat. To contact the covering physician during after hours 7P-7A, please review Amion.   02/05/2024, 9:38 AM   *This document has been created with the assistance of dictation software. Please excuse typographical errors. *

## 2024-02-05 NOTE — Consult Note (Signed)
 Pharmacy Consult Note - Anticoagulation  Pharmacy Consult for heparin  Indication: atrial fibrillation  PATIENT MEASUREMENTS: Height: 4' 9 (144.8 cm) Weight: 57.6 kg (127 lb) IBW/kg (Calculated) : 38.6 HEPARIN  DW (KG): 51.1  VITAL SIGNS: Temp: 97.9 F (36.6 C) (02/08 0403) Temp Source: Oral (02/08 0403) BP: 154/79 (02/08 0403) Pulse Rate: 81 (02/08 0403)  Recent Labs    02/05/24 0432  HGB 10.8*  HCT 33.8*  PLT 159  HEPARINUNFRC 0.43  CREATININE 1.41*    Estimated Creatinine Clearance: 29.8 mL/min (A) (by C-G formula based on SCr of 1.41 mg/dL (H)).  PAST MEDICAL HISTORY: Past Medical History:  Diagnosis Date   Asthma    Bicuspid aortic valve 09/20/2016   a. echo 09/19/16: EF 55-60%, GR1DD, possible bicuspid aortic valve without evidence of AS   Bronchitis    Coronary artery disease, non-occlusive    a. cath 09/21/16: ostLM to LM 20%, no evidence of aortic stenosis   Demand ischemia (HCC) 09/20/2016   Heart murmur    Hiatal hernia    History of blood transfusion    Hypertension    Persistent cough for 3 weeks or longer 05/19/2022   Ulcer     ASSESSMENT: 63 y.o. female with PMH including HTN, HFpEF, mild aortic calcification is presenting with new-onset atrial fibrillation with RVR. Patient is not on chronic anticoagulation per chart review. CHA2DS2VASc is 3 (HTN, CHF, female sex). Pharmacy has been consulted to initiate and manage heparin  intravenous infusion.  Pertinent medications: No chronic anticoagulation PTA per chart review  Goal(s) of therapy: Heparin  level 0.3 - 0.7 units/mL Monitor platelets by anticoagulation protocol: Yes   Baseline anticoagulation labs: Recent Labs    02/03/24 0550 02/04/24 0555 02/05/24 0432  HGB 12.8 11.1* 10.8*  PLT 114* 142* 159   Date/time Results Comments 02/05 2252  HL 0.7  Therapeutic x 1 02/06 0550 HL 0.75 Supra-therapeutic 02/06 1415 HL >1.10 Supra-therapeutic @ 700 units/hr 02/06 2339 HL 0.64 Therapeutic x  1 02/07 0555 HL 0.65 Therapeutic x 2 02/08 0432 HL 0.43 Therapeutic x 3  PLAN: Continue heparin  infusion at 550 units/hr Recheck HL daily w/ AM labs while therapeutic. Monitor CBC daily while on heparin  infusion.  Rankin CANDIE Dills, PharmD, [facility] 02/05/2024 5:41 AM

## 2024-02-05 NOTE — Progress Notes (Signed)
 Rounding Note    Patient Name: Michelle Barnett Date of Encounter: 02/05/2024  [facility] Cardiologist: None   Subjective   Denies any CP or SOB.  Remains in NSR.  BP stable  Inpatient Medications    Scheduled Meds:  atorvastatin   40 mg Oral QHS   cyanocobalamin   1,000 mcg Oral Daily   methylPREDNISolone  (SOLU-MEDROL ) injection  80 mg Intravenous Q1500   metoprolol  succinate  25 mg Oral BID   mupirocin  ointment   Nasal BID   potassium chloride   40 mEq Oral Once   QUEtiapine   12.5 mg Oral Daily   Continuous Infusions:  amiodarone  30 mg/hr (02/05/24 0015)   doxycycline  (VIBRAMYCIN ) IV 100 mg (02/04/24 2155)   heparin  550 Units/hr (02/04/24 1709)   PRN Meds: acetaminophen  **OR** acetaminophen , albuterol , hydrALAZINE , ondansetron  **OR** ondansetron  (ZOFRAN ) IV, senna-docusate   Vital Signs    Vitals:   02/04/24 2330 02/05/24 0319 02/05/24 0403 02/05/24 0808  BP: 136/77 (!) 164/88 (!) 154/79 (!) 141/80  Pulse: 80 80 81 82  Resp: 18 20 20  (!) 24  Temp: 98 F (36.7 C) 97.6 F (36.4 C) 97.9 F (36.6 C) 97.6 F (36.4 C)  TempSrc: Oral Oral Oral Oral  SpO2: 98% 97% 99% 99%  Weight:      Height:        Intake/Output Summary (Last 24 hours) at 02/05/2024 1044 Last data filed at 02/05/2024 0423 Gross per 24 hour  Intake 946.52 ml  Output --  Net 946.52 ml      02/01/2024   12:12 PM 02/01/2024   10:55 AM 09/24/2023    8:25 AM  Last 3 Weights  Weight (lbs) 127 lb -- 122 lb  Weight (kg) 57.607 kg -- 55.339 kg      Telemetry    Had 2 episodes of afib with RVr last night but currently in NSR - Personally Reviewed  Physical Exam   GEN: Well nourished, well developed in no acute distress HEENT: large bloody scabbed lesions on the mouth, ecchymosis over left eye NECK: No JVD; No carotid bruits LYMPHATICS: No lymphadenopathy CARDIAC:RRR, no murmurs, rubs, gallops RESPIRATORY:  scattered inspiratory and expiratory rhonchi and wheezes ABDOMEN: Soft,  non-tender, non-distended MUSCULOSKELETAL:  No edema; No deformity  SKIN: Warm and dry NEUROLOGIC:  Alert and oriented x 3 PSYCHIATRIC:  Normal affect  Labs    High Sensitivity Troponin:  No results for input(s): TROPONINIHS in the last 720 hours.   Chemistry Recent Labs  Lab 02/03/24 1034 02/04/24 0555 02/05/24 0432  NA 142 144 141  K 3.3* 3.2* 3.0*  CL 100 101 101  CO2 34* 32 31  GLUCOSE 141* 141* 135*  BUN 33* 47* 50*  CREATININE 0.90 1.47* 1.41*  CALCIUM  8.1* 8.1* 8.3*  MG 2.4 2.3 2.4  PROT 5.5* 5.5* 5.6*  ALBUMIN 2.3* 2.3* 2.5*  AST 60* 50* 46*  ALT 24 23 23   ALKPHOS 27* 28* 28*  BILITOT 0.5 0.2 0.8  GFRNONAA >60 40* 42*  ANIONGAP 9 11 9     Lipids No results for input(s): CHOL, TRIG, HDL, LABVLDL, LDLCALC, CHOLHDL in the last 168 hours.  Hematology Recent Labs  Lab 02/03/24 0550 02/04/24 0555 02/05/24 0432  WBC 7.3 6.5 6.1  RBC 4.13 3.56* 3.52*  HGB 12.8 11.1* 10.8*  HCT 40.2 35.4* 33.8*  MCV 97.3 99.4 96.0  MCH 31.0 31.2 30.7  MCHC 31.8 31.4 32.0  RDW 15.3 15.3 15.0  PLT 114* 142* 159   Thyroid   Recent  Labs  Lab 02/02/24 1812 02/03/24 1034  TSH 0.228*  --   FREET4  --  0.88    BNPNo results for input(s): BNP, PROBNP in the last 168 hours.  DDimer No results for input(s): DDIMER in the last 168 hours.   Radiology    ECHOCARDIOGRAM COMPLETE Result Date: 02/02/2024 IMPRESSIONS  1. Left ventricular ejection fraction, by estimation, is 55 to 60%. Left ventricular ejection fraction by PLAX is 56 %. The left ventricle has normal function. The left ventricle has no regional wall motion abnormalities. There is mild left ventricular hypertrophy. Left ventricular diastolic parameters were normal.  2. Right ventricular systolic function is normal. The right ventricular size is moderately enlarged. There is normal pulmonary artery systolic pressure. The estimated right ventricular systolic pressure is 30.0 mmHg.  3. The mitral valve is normal  in structure. Moderate mitral valve regurgitation. No evidence of mitral stenosis.  4. Tricuspid valve regurgitation is moderate.  5. The aortic valve has an indeterminant number of cusps. There is moderate calcification of the aortic valve. Aortic valve regurgitation is not visualized. Moderate aortic valve stenosis. Aortic valve mean gradient measures 22.0 mmHg.  6. The inferior vena cava is normal in size with greater than 50% respiratory variability, suggesting right atrial pressure of 3 mmHg.Electronically signed by Evalene Lunger MD Signature Date/Time: 02/02/2024/4:30:33 PM    Final    DG Chest Port 1 View Result Date: 02/01/2024 IMPRESSION: *Heterogeneous opacities overlying the right mid lower lung zones without significant volume loss, concerning for pneumonia. Follow-up to clearing is recommended. *Subtle blunting of bilateral lateral costophrenic angles may represent trace pleural effusions. Electronically Signed   By: Ree Molt M.D.   On: 02/01/2024 13:11   CT Head Wo Contrast Result Date: 02/01/2024 IMPRESSION: 1. No CT evidence of intracranial injury. 2. No acute facial bone fracture. 3. No acute fracture or traumatic subluxation of the cervical spine. 4. Soft tissue swelling in the periorbital soft tissues on the left and along the left frontal scalp. Electronically Signed   By: Lyndall Gore M.D.   On: 02/01/2024 13:02   CT Maxillofacial Wo Contrast Result Date: 02/01/2024 IMPRESSION: 1. No CT evidence of intracranial injury. 2. No acute facial bone fracture. 3. No acute fracture or traumatic subluxation of the cervical spine. 4. Soft tissue swelling in the periorbital soft tissues on the left and along the left frontal scalp. Electronically Signed   By: Lyndall Gore M.D.   On: 02/01/2024 13:02   CT Cervical Spine Wo Contrast Result Date: 02/01/2024 IMPRESSION: 1. No CT evidence of intracranial injury. 2. No acute facial bone fracture. 3. No acute fracture or traumatic subluxation of the  cervical spine. 4. Soft tissue swelling in the periorbital soft tissues on the left and along the left frontal scalp. Electronically Signed   By: Lyndall Gore M.D.   On: 02/01/2024 13:02    Cardiac Studies   05/27/2022 TTE 1. Left ventricular ejection fraction, by estimation, is 60 to 65%. The  left ventricle has normal function. The left ventricle has no regional  wall motion abnormalities. There is mild left ventricular hypertrophy.  Left ventricular diastolic parameters  are consistent with Grade II diastolic dysfunction (pseudonormalization).   2. Right ventricular systolic function is normal. The right ventricular  size is normal.   3. Left atrial size was mildly dilated.   4. The mitral valve is grossly normal. Mild mitral valve regurgitation.   5. The aortic valve is calcified. Aortic valve regurgitation is  not  visualized. Mild aortic valve stenosis.   6. The inferior vena cava is normal in size with greater than 50%  respiratory variability, suggesting right atrial pressure of 3 mmHg.    09/13/2016 [facility] Ost LM to LM lesion, 20 %stenosed. There is no aortic valve stenosis.   1. Mild ostial left main stenosis due to slight kinking of the vessel. Otherwise no evidence of obstructive coronary artery disease. 2. Normal left ventricular end-diastolic pressure with minimal gradient across the aortic valve. Normal ejection fraction by echo.  Patient Profile     Michelle Barnett is a 63 y.o. female with a hx of asthma/COPD, bicuspid aortic valve, non-occlusive CAD, and hypertension who is being seen for the continued evaluation of atrial fibrillation with RVR.   Assessment & Plan    Right middle lobe pneumonia Hypoxia Asthma - Presented 2/4 with cough, dyspnea, and hypoxia - CXR with evidence of pneumonia - Currently requiring BiPAP  - Management per IM  New onset atrial fibrillation with RVR - Patient's chart reviewed - no known history of atrial fibrillation - Sinus rhythm  on arrival, converted to atrial fibrillation with RVR 2/5 ~1230. Started on IV amiodarone  bolus and infusion with successful conversion to sinus rhythm 2/5.  - now in NSR but had 2 episodes of afib with RVr last night - High risk for reoccurrence of atrial fibrillation in the setting of pneumonia  - Echo 2/5 showed LVEF 55-60% without RWMA - CHA2DS2VASc 4 - For now continue IV Heparin  with transition to Eliquis  at d/c - Continue IV Amio for now and transition to oral once respiratory status has improved>>would not recommend use long tern given young age>>would recommend Amio PO for 1 month after discharge and then d/c once she has recovered from her PNA -continue Toprol  XL 25mg  BID - replete K+ to keep K+>4 and Mag > 2  Hypokalemia -K+ 3 this am -give Kdur 40meq BID today and reassess BMET in am -Mag 2.4  Acute kidney injury - SCr decreased from 1.47>>1.41 today - Management per IM  Ecchymosis of the left eye - Present on admission secondary to fall at home  Hyperlipidemia - LDL goal < 70 - Most recent lipid panel 04/2023 with LDL 112 - continue Atorvastatin  40mg  daily for now - check FLP in am  Essential hypertension - BP controlled - continue Toprol  XL 25mg  BID - PTA Lisinopril  HCT remains on hold due to AKI  Aortic Stenosis -moderate by echo 02/01/34 -follow with yearly echo outp  Mitral Regurgitation -moderate by echo 2/25 -follow with year echo outpt  I spent 25 minutes caring for this patient today face to face, ordering and reviewing labs, reviewing records from 2D echo from 02/02/24, [facility] from 2017 , seeing the patient, documenting in the record  For questions or updates, please contact [facility] Please consult [email or web address] for contact info under        Signed, Wilbert Bihari, MD  02/05/2024, 10:44 AM

## 2024-02-06 DIAGNOSIS — B009 Herpesviral infection, unspecified: Secondary | ICD-10-CM

## 2024-02-06 DIAGNOSIS — S0512XA Contusion of eyeball and orbital tissues, left eye, initial encounter: Secondary | ICD-10-CM | POA: Diagnosis not present

## 2024-02-06 DIAGNOSIS — I4891 Unspecified atrial fibrillation: Secondary | ICD-10-CM | POA: Diagnosis not present

## 2024-02-06 DIAGNOSIS — E876 Hypokalemia: Secondary | ICD-10-CM | POA: Diagnosis not present

## 2024-02-06 DIAGNOSIS — J189 Pneumonia, unspecified organism: Secondary | ICD-10-CM | POA: Diagnosis not present

## 2024-02-06 DIAGNOSIS — J9601 Acute respiratory failure with hypoxia: Secondary | ICD-10-CM | POA: Diagnosis not present

## 2024-02-06 LAB — COMPREHENSIVE METABOLIC PANEL
ALT: 21 U/L (ref 0–44)
AST: 36 U/L (ref 15–41)
Albumin: 2.5 g/dL — ABNORMAL LOW (ref 3.5–5.0)
Alkaline Phosphatase: 29 U/L — ABNORMAL LOW (ref 38–126)
Anion gap: 12 (ref 5–15)
BUN: 51 mg/dL — ABNORMAL HIGH (ref 8–23)
CO2: 34 mmol/L — ABNORMAL HIGH (ref 22–32)
Calcium: 8.9 mg/dL (ref 8.9–10.3)
Chloride: 100 mmol/L (ref 98–111)
Creatinine, Ser: 1.52 mg/dL — ABNORMAL HIGH (ref 0.44–1.00)
GFR, Estimated: 38 mL/min — ABNORMAL LOW (ref >60)
Glucose, Bld: 160 mg/dL — ABNORMAL HIGH (ref 70–99)
Potassium: 3.5 mmol/L (ref 3.5–5.1)
Sodium: 146 mmol/L — ABNORMAL HIGH (ref 135–145)
Total Bilirubin: 0.3 mg/dL (ref 0.0–1.2)
Total Protein: 5.6 g/dL — ABNORMAL LOW (ref 6.5–8.1)

## 2024-02-06 LAB — CULTURE, BLOOD (ROUTINE X 2)
Culture: NO GROWTH
Culture: NO GROWTH

## 2024-02-06 LAB — CBC WITH DIFFERENTIAL/PLATELET
Abs Immature Granulocytes: 0.32 10*3/uL — ABNORMAL HIGH (ref 0.00–0.07)
Basophils Absolute: 0 10*3/uL (ref 0.0–0.1)
Basophils Relative: 0 %
Eosinophils Absolute: 0 10*3/uL (ref 0.0–0.5)
Eosinophils Relative: 0 %
HCT: 34.2 % — ABNORMAL LOW (ref 36.0–46.0)
Hemoglobin: 10.7 g/dL — ABNORMAL LOW (ref 12.0–15.0)
Immature Granulocytes: 5 %
Lymphocytes Relative: 4 %
Lymphs Abs: 0.2 10*3/uL — ABNORMAL LOW (ref 0.7–4.0)
MCH: 31 pg (ref 26.0–34.0)
MCHC: 31.3 g/dL (ref 30.0–36.0)
MCV: 99.1 fL (ref 80.0–100.0)
Monocytes Absolute: 0.4 10*3/uL (ref 0.1–1.0)
Monocytes Relative: 6 %
Neutro Abs: 5.7 10*3/uL (ref 1.7–7.7)
Neutrophils Relative %: 85 %
Platelets: 194 10*3/uL (ref 150–400)
RBC: 3.45 MIL/uL — ABNORMAL LOW (ref 3.87–5.11)
RDW: 15.1 % (ref 11.5–15.5)
WBC: 6.7 10*3/uL (ref 4.0–10.5)
nRBC: 0.3 % — ABNORMAL HIGH (ref 0.0–0.2)

## 2024-02-06 LAB — PHOSPHORUS: Phosphorus: 3.7 mg/dL (ref 2.5–4.6)

## 2024-02-06 LAB — MAGNESIUM: Magnesium: 2.2 mg/dL (ref 1.7–2.4)

## 2024-02-06 LAB — HEPARIN LEVEL (UNFRACTIONATED): Heparin Unfractionated: 0.46 [IU]/mL (ref 0.30–0.70)

## 2024-02-06 LAB — LIPID PANEL
Cholesterol: 137 mg/dL (ref 0–200)
HDL: 39 mg/dL — ABNORMAL LOW (ref >40)
LDL Cholesterol: 75 mg/dL (ref 0–99)
Total CHOL/HDL Ratio: 3.5 {ratio}
Triglycerides: 115 mg/dL (ref <150)
VLDL: 23 mg/dL (ref 0–40)

## 2024-02-06 MED ORDER — VALACYCLOVIR HCL 500 MG PO TABS
1000.0000 mg | ORAL_TABLET | Freq: Every day | ORAL | Status: DC
  Administered 2024-02-06 – 2024-02-07 (×2): 1000 mg via ORAL
  Filled 2024-02-06 (×3): qty 2

## 2024-02-06 MED ORDER — AMIODARONE HCL 200 MG PO TABS
200.0000 mg | ORAL_TABLET | Freq: Two times a day (BID) | ORAL | Status: DC
  Administered 2024-02-06 – 2024-02-08 (×6): 200 mg via ORAL
  Filled 2024-02-06 (×7): qty 1

## 2024-02-06 MED ORDER — POTASSIUM CHLORIDE CRYS ER 20 MEQ PO TBCR
40.0000 meq | EXTENDED_RELEASE_TABLET | Freq: Once | ORAL | Status: AC
  Administered 2024-02-06: 40 meq via ORAL
  Filled 2024-02-06: qty 2

## 2024-02-06 MED ORDER — EZETIMIBE 10 MG PO TABS
10.0000 mg | ORAL_TABLET | Freq: Every day | ORAL | Status: DC
  Administered 2024-02-06 – 2024-02-08 (×3): 10 mg via ORAL
  Filled 2024-02-06 (×4): qty 1

## 2024-02-06 MED ORDER — APIXABAN 2.5 MG PO TABS
2.5000 mg | ORAL_TABLET | Freq: Two times a day (BID) | ORAL | Status: DC
  Administered 2024-02-06 – 2024-02-07 (×4): 2.5 mg via ORAL
  Filled 2024-02-06 (×4): qty 1

## 2024-02-06 NOTE — Plan of Care (Signed)
  Problem: Safety: Goal: Non-violent Restraint(s) Outcome: Progressing   Problem: Education: Goal: Knowledge of General Education information will improve Description: Including pain rating scale, medication(s)/side effects and non-pharmacologic comfort measures Outcome: Progressing   Problem: Health Behavior/Discharge Planning: Goal: Ability to manage health-related needs will improve Outcome: Progressing   Problem: Clinical Measurements: Goal: Ability to maintain clinical measurements within normal limits will improve Outcome: Progressing Goal: Will remain free from infection Outcome: Progressing Goal: Diagnostic test results will improve Outcome: Progressing Goal: Respiratory complications will improve Outcome: Progressing Goal: Cardiovascular complication will be avoided Outcome: Progressing   Problem: Activity: Goal: Risk for activity intolerance will decrease Outcome: Progressing   Problem: Nutrition: Goal: Adequate nutrition will be maintained Outcome: Progressing   Problem: Coping: Goal: Level of anxiety will decrease Outcome: Progressing   Problem: Elimination: Goal: Will not experience complications related to bowel motility Outcome: Progressing Goal: Will not experience complications related to urinary retention Outcome: Progressing   Problem: Pain Managment: Goal: General experience of comfort will improve and/or be controlled Outcome: Progressing   Problem: Safety: Goal: Ability to remain free from injury will improve Outcome: Progressing   Problem: Skin Integrity: Goal: Risk for impaired skin integrity will decrease Outcome: Progressing   Problem: Activity: Goal: Ability to tolerate increased activity will improve Outcome: Progressing   Problem: Clinical Measurements: Goal: Ability to maintain a body temperature in the normal range will improve Outcome: Progressing   Problem: Respiratory: Goal: Ability to maintain adequate ventilation will  improve Outcome: Progressing Goal: Ability to maintain a clear airway will improve Outcome: Progressing

## 2024-02-06 NOTE — Consult Note (Signed)
 Pharmacy Consult Note - Anticoagulation  Pharmacy Consult for heparin  Indication: atrial fibrillation  PATIENT MEASUREMENTS: Height: 4' 9 (144.8 cm) Weight: 57.6 kg (127 lb) IBW/kg (Calculated) : 38.6 HEPARIN  DW (KG): 51.1  VITAL SIGNS: Temp: 98.2 F (36.8 C) (02/08 2000) Temp Source: Oral (02/08 2000) BP: 151/76 (02/08 2000) Pulse Rate: 72 (02/08 2000)  Recent Labs    02/06/24 0424  HGB 10.7*  HCT 34.2*  PLT 194  HEPARINUNFRC 0.46  CREATININE 1.52*    Estimated Creatinine Clearance: 27.6 mL/min (A) (by C-G formula based on SCr of 1.52 mg/dL (H)).  PAST MEDICAL HISTORY: Past Medical History:  Diagnosis Date   Asthma    Bicuspid aortic valve 09/20/2016   a. echo 09/19/16: EF 55-60%, GR1DD, possible bicuspid aortic valve without evidence of AS   Bronchitis    Coronary artery disease, non-occlusive    a. cath 09/21/16: ostLM to LM 20%, no evidence of aortic stenosis   Demand ischemia (HCC) 09/20/2016   Heart murmur    Hiatal hernia    History of blood transfusion    Hypertension    Persistent cough for 3 weeks or longer 05/19/2022   Ulcer     ASSESSMENT: 63 y.o. female with PMH including HTN, HFpEF, mild aortic calcification is presenting with new-onset atrial fibrillation with RVR. Patient is not on chronic anticoagulation per chart review. CHA2DS2VASc is 3 (HTN, CHF, female sex). Pharmacy has been consulted to initiate and manage heparin  intravenous infusion.  Pertinent medications: No chronic anticoagulation PTA per chart review  Goal(s) of therapy: Heparin  level 0.3 - 0.7 units/mL Monitor platelets by anticoagulation protocol: Yes   Baseline anticoagulation labs: Recent Labs    02/04/24 0555 02/05/24 0432 02/06/24 0424  HGB 11.1* 10.8* 10.7*  PLT 142* 159 194   Date/time Results Comments 02/05 2252  HL 0.7  Therapeutic x 1 02/06 0550 HL 0.75 Supra-therapeutic 02/06 1415 HL >1.10 Supra-therapeutic @ 700 units/hr 02/06 2339 HL 0.64 Therapeutic x  1 02/07 0555 HL 0.65 Therapeutic x 2 02/08 0432 HL 0.43 Therapeutic x 3 02/09 0424 HL 0.46 Therapeutic x 4  PLAN: Continue heparin  infusion at 550 units/hr Recheck HL daily w/ AM labs while therapeutic. Monitor CBC daily while on heparin  infusion.  Rankin CANDIE Dills, PharmD, [facility] 02/06/2024 5:38 AM

## 2024-02-06 NOTE — Progress Notes (Signed)
 PROGRESS NOTE    [name]  FMW:[id number] DOB: [birth year] DOA: 02/01/2024 PCP: Gretta Comer POUR, NP  Chief Complaint  Patient presents with   Respiratory Distress    Hospital Course:  Michelle Barnett is 63 y.o. female with hypertension, hyperlipidemia, iron deficiency anemia, who presents to the ED with acute hypoxia.  Patient was previously in her PCP office where she was noted to be 66% on room air, this improved on 3 L O2 but could not get higher than 88%.  EMS was called and brought the patient to the hospital.  In the ED she was found to be septic with a temperature of 100.7, respirations 20, tachycardic to 113 and hypoxic.  Labs were mostly unrevealing.  Patient underwent CT head, CT cervical spine, CT maxillofacial which revealed soft tissue swelling of the periorbital soft tissues of the left and along the left frontal scalp.  She received DuoNebs, azithromycin , ceftriaxone .  Subjective: Patient with waxing and waning alertness today.  She answer some questions, her husband reports she recognized him this morning but not again on later evaluations.  Objective: Vitals:   02/05/24 2000 02/05/24 2324 02/06/24 0400 02/06/24 0740  BP: (!) 151/76 (!) 122/55 139/78 132/65  Pulse: 72 68 70 67  Resp: 16 18 16 18   Temp: 98.2 F (36.8 C) 98.4 F (36.9 C) 98.1 F (36.7 C) 98 F (36.7 C)  TempSrc: Oral Oral Oral Axillary  SpO2: 98% 98% 98% 97%  Weight:      Height:        Intake/Output Summary (Last 24 hours) at 02/06/2024 0925 Last data filed at 02/06/2024 0513 Gross per 24 hour  Intake 1250.75 ml  Output 1400 ml  Net -149.25 ml   Filed Weights   02/01/24 1212  Weight: 57.6 kg    Examination: General exam: Appears calm and comfortable, NAD  Respiratory system: tachypnea, crackles lower lung bases. Cardiovascular system:  irregular rhythm Gastrointestinal system: Abdomen is nondistended, soft and nontender.  Neuro: disoriented, requires prompting for  questions. Extremities: Symmetric, expected ROM Skin: Lip scabbing is healing significant, lower lip with clean base superficial lesion, not actively bleeding no crusting or scabbing.  Upper lip with some vesicular lesions with black scab, some surrounding honey crusting remains. no active bleeding.  Significantly less scab burden compared to prior  psychiatry: calm.  Mood and affect appropriate.       Assessment & Plan:  Principal Problem:   Pneumonia of right lower lobe due to infectious organism Active Problems:   AKI (acute kidney injury) (HCC)   Essential hypertension   Vitamin B12 deficiency   Adjustment disorder with mixed anxiety and depressed mood   Acute hypoxic respiratory failure (HCC)   GERD (gastroesophageal reflux disease)   Mild intermittent asthma without complication   Hyperlipidemia   Hypoxia   Ecchymosis of left eye   Sepsis (HCC)   Paroxysmal atrial fibrillation (HCC)   Atrial fibrillation with RVR (HCC)   Hiatal hernia   Pneumonia     Right middle lobe pneumonia - Was on azithromycin  and ceftriaxone , now changed to doxycycline  in light of amiodarone  - Procalcitonin 0.4 - CTA small pleural effusions and pneumonia. - Continue flutter valve and incentive spirometry - Continue supplemental O2 and wean as tolerated  Acute hypoxic respiratory failure - Multifactorial:  right middle lobe pneumonia, also some evidence of vascular congestion/pulmonary edema on chest x-ray.  Echo does not reveal evidence of heart failure or diastolic dysfunction.  Status post trial of  Lasix , minimal improvement on chest x-ray. Given persistent effusions received IV lasix  again on 2/8, subsequently had worsening Cr and some contraction alkalosis. - CTA small pleural effusions and pneumonia. - Continue supplemental O2, maintain sats above 92% -- ABG 2/6 confirmed respiratory acidosis with metabolic compensation.  Patient underwent BiPAP trial and had significant improvement on  repeat ABG. - Currently on high flow nasal cannula, will continue to wean as tolerated.  Intermittent BiPAP as needed - Continuous pulse ox - Patient meant to be on anuity inhaler outpatient but is not taking.  Chronic hiatal hernia - Quite large, seen on CXR.  Likely contributing to atelectasis of left lung, initially obscuring additional pathology.  Observe for now.  Would benefit from general surgery consultation outpatient  A-fib RVR - Intermittent - Likely exacerbated by hypoxia, and breathing treatments. - On amio infusion and hep gtt--> off Bipap for 24hrs now so will transition to PO Amio and Eliquis  (2.5mg  given weight and Cr) -- TSH low, T4 WNL - Keep tight control of electrolytes - Appreciate cardiology consult - CHA2DS2-VASc: 4, currently on IV heparin  - Echo without RWMA, EF 55 to 60%.  No diastolic or valvular dysfunction noted.  Hypomagnesemia Hypophosphatemia - Replace as needed  Asthma exacerbation - Start prednisone . - Continue DuoNebs 4 times daily - Albuterol  as needed for wheezing  Acute metabolic encephalopathy - Likely superimposed on history of dementia.  Acute delirium multifactorial from hypoxia and sepsis - Status post one-time dose of Haldol , scheduled low dose seroquel , now on wrist restraints.  Family members at bedside and have been educated. Doing better daily.   AKI - Presumed prerenal in setting of poor p.o. intake, worsening contraction alkalosis today in setting of increased lasix  yesterday  - Baseline creatinine appears to be 0.57 - Status post IV fluids - Creatinine improved - Avoid nephrotoxic meds - Renally dose when needed  Ecchymosis of left eye and forehead - Present on admission secondary to fall at home - No intracranial abnormalities  Perioral blood encrusted lesions - Boyfriend reports this is secondary to fall, but have been getting worse since admission -- Pt does appear to be biting these areas and she has been on heparin ,  which is certainly exacerbating the oozing -- Initially had honeycrusting lesions, thought to be impetigo treated with mupirocin . Lesions have improved significantly since initiation of mupirocin . -- HSV now positive - start valtrex . No mucosal involvement, no lesions in other locations.  -- VSV pending, mycoplasma IgM pending - Gram stain with few gram+ rods, no significant growth ---HIV negative  Hypertension - Lisinopril  and HCTZ held at time of admission for AKI. - Continue to monitor, titrate as needed  Memory loss, intermittent delirium - Patient's boyfriend at bedside reports that she has had increasing difficulty with memory outpatient.  He recently took her to PCP for this evaluation - HIV negative, RPR nonreactive, B12 elevated, TSH low, T4 WNL, thiamine  pending - Head CT negative for acute intracranial abnormalities, may benefit from brain MRI outpatient with formal neuropsych testing. - Continue with frequent reorientation, delirium precautions - Scheduled low-dose Seroquel  in the morning, as needed Haldol .  Wrist restraints as needed as patient has continuously taken out IVs during this admission and does not have consistent p.o. intake  DVT prophylaxis: Heparin  infusion   Code Status: Full Code Family Communication: none at bedside today.  Disposition:  Status is: Inpatient Remains inpatient appropriate because: Workup ongoing, not medically cleared yet.  May eventually require discharge to SNF.  Will  consult PT/OT today.   Consultants:  Cardiology  Treatment Team:  Consulting Physician: Perla Evalene PARAS, MD  Procedures:  N/a  Antimicrobials:  Anti-infectives (From admission, onward)    Start     Dose/Rate Route Frequency Ordered Stop   02/03/24 1000  doxycycline  (VIBRAMYCIN ) 100 mg in sodium chloride  0.9 % 250 mL IVPB        100 mg 125 mL/hr over 120 Minutes Intravenous Every 12 hours 02/02/24 1328     02/02/24 1100  azithromycin  (ZITHROMAX ) 500 mg in sodium  chloride 0.9 % 250 mL IVPB  Status:  Discontinued        500 mg 250 mL/hr over 60 Minutes Intravenous Every 24 hours 02/01/24 1402 02/02/24 1328   02/02/24 1000  cefTRIAXone  (ROCEPHIN ) 2 g in sodium chloride  0.9 % 100 mL IVPB        2 g 200 mL/hr over 30 Minutes Intravenous Every 24 hours 02/01/24 1402 02/05/24 1832   02/01/24 1230  cefTRIAXone  (ROCEPHIN ) 2 g in sodium chloride  0.9 % 100 mL IVPB        2 g 200 mL/hr over 30 Minutes Intravenous  Once 02/01/24 1218 02/01/24 1309   02/01/24 1230  azithromycin  (ZITHROMAX ) 500 mg in sodium chloride  0.9 % 250 mL IVPB        500 mg 250 mL/hr over 60 Minutes Intravenous  Once 02/01/24 1218 02/01/24 1421       Data Reviewed: I have personally reviewed following labs and imaging studies CBC: Recent Labs  Lab 02/01/24 1221 02/02/24 0437 02/03/24 0550 02/04/24 0555 02/05/24 0432 02/06/24 0424  WBC 8.8 9.6 7.3 6.5 6.1 6.7  NEUTROABS 7.5  --   --  5.8 5.1 5.7  HGB 13.9 13.0 12.8 11.1* 10.8* 10.7*  HCT 41.5 40.2 40.2 35.4* 33.8* 34.2*  MCV 92.6 96.2 97.3 99.4 96.0 99.1  PLT 115* 95* 114* 142* 159 194   Basic Metabolic Panel: Recent Labs  Lab 02/02/24 0437 02/02/24 1349 02/02/24 1349 02/02/24 2252 02/03/24 1034 02/04/24 0555 02/05/24 0432 02/06/24 0424  NA 136  --   --   --  142 144 141 146*  K 3.5  --   --   --  3.3* 3.2* 3.0* 3.5  CL 99  --   --   --  100 101 101 100  CO2 28  --   --   --  34* 32 31 34*  GLUCOSE 105*  --   --   --  141* 141* 135* 160*  BUN 54*  --   --   --  33* 47* 50* 51*  CREATININE 1.17*  --   --   --  0.90 1.47* 1.41* 1.52*  CALCIUM  8.2*  --   --   --  8.1* 8.1* 8.3* 8.9  MG  --  1.7  --   --  2.4 2.3 2.4 2.2  PHOS  --  1.5*   < > 2.6 3.1 3.4 2.6 3.7   < > = values in this interval not displayed.   GFR: Estimated Creatinine Clearance: 27.6 mL/min (A) (by C-G formula based on SCr of 1.52 mg/dL (H)). Liver Function Tests: Recent Labs  Lab 02/01/24 1221 02/03/24 1034 02/04/24 0555 02/05/24 0432  02/06/24 0424  AST 65* 60* 50* 46* 36  ALT 26 24 23 23 21   ALKPHOS 30* 27* 28* 28* 29*  BILITOT 0.6 0.5 0.2 0.8 0.3  PROT 6.4* 5.5* 5.5* 5.6* 5.6*  ALBUMIN 3.1* 2.3* 2.3* 2.5* 2.5*  CBG: Recent Labs  Lab 02/04/24 2054 02/04/24 2334  GLUCAP 146* 142*    Recent Results (from the past 240 hours)  Resp panel by RT-PCR (RSV, Flu A&B, Covid) Anterior Nasal Swab     Status: None   Collection Time: 02/01/24 12:21 PM   Specimen: Anterior Nasal Swab  Result Value Ref Range Status   SARS Coronavirus 2 by RT PCR NEGATIVE NEGATIVE Final    Comment: (NOTE) SARS-CoV-2 target nucleic acids are NOT DETECTED.  The SARS-CoV-2 RNA is generally detectable in upper respiratory specimens during the acute phase of infection. The lowest concentration of SARS-CoV-2 viral copies this assay can detect is 138 copies/mL. A negative result does not preclude SARS-Cov-2 infection and should not be used as the sole basis for treatment or other patient management decisions. A negative result may occur with  improper specimen collection/handling, submission of specimen other than nasopharyngeal swab, presence of viral mutation(s) within the areas targeted by this assay, and inadequate number of viral copies(<138 copies/mL). A negative result must be combined with clinical observations, patient history, and epidemiological information. The expected result is Negative.  Fact Sheet for Patients:  bloggercourse.com  Fact Sheet for Healthcare Providers:  seriousbroker.it  This test is no t yet approved or cleared by the [place]  FDA and  has been authorized for detection and/or diagnosis of SARS-CoV-2 by FDA under an Emergency Use Authorization (EUA). This EUA will remain  in effect (meaning this test can be used) for the duration of the COVID-19 declaration under Section 564(b)(1) of the Act, 21 U.S.C.section 360bbb-3(b)(1), unless the authorization is  terminated  or revoked sooner.       Influenza A by PCR NEGATIVE NEGATIVE Final   Influenza B by PCR NEGATIVE NEGATIVE Final    Comment: (NOTE) The Xpert Xpress SARS-CoV-2/FLU/RSV plus assay is intended as an aid in the diagnosis of influenza from Nasopharyngeal swab specimens and should not be used as a sole basis for treatment. Nasal washings and aspirates are unacceptable for Xpert Xpress SARS-CoV-2/FLU/RSV testing.  Fact Sheet for Patients: bloggercourse.com  Fact Sheet for Healthcare Providers: seriousbroker.it  This test is not yet approved or cleared by the [place]  FDA and has been authorized for detection and/or diagnosis of SARS-CoV-2 by FDA under an Emergency Use Authorization (EUA). This EUA will remain in effect (meaning this test can be used) for the duration of the COVID-19 declaration under Section 564(b)(1) of the Act, 21 U.S.C. section 360bbb-3(b)(1), unless the authorization is terminated or revoked.     Resp Syncytial Virus by PCR NEGATIVE NEGATIVE Final    Comment: (NOTE) Fact Sheet for Patients: bloggercourse.com  Fact Sheet for Healthcare Providers: seriousbroker.it  This test is not yet approved or cleared by the [place]  FDA and has been authorized for detection and/or diagnosis of SARS-CoV-2 by FDA under an Emergency Use Authorization (EUA). This EUA will remain in effect (meaning this test can be used) for the duration of the COVID-19 declaration under Section 564(b)(1) of the Act, 21 U.S.C. section 360bbb-3(b)(1), unless the authorization is terminated or revoked.  Performed at [facility], [address] [place], [place] [address]   Blood Culture (routine x 2)     Status: None   Collection Time: 02/01/24 12:21 PM   Specimen: BLOOD  Result Value Ref Range Status   Specimen Description BLOOD LEFT ANTECUBITAL  Final    Special Requests   Final    BOTTLES DRAWN AEROBIC AND ANAEROBIC Blood Culture results may not  be optimal due to an inadequate volume of blood received in culture bottles   Culture   Final    NO GROWTH 5 DAYS Performed at [facility], [address] [place], [place] [address]    Report Status 02/06/2024 FINAL  Final  Blood Culture (routine x 2)     Status: None   Collection Time: 02/01/24 12:21 PM   Specimen: BLOOD  Result Value Ref Range Status   Specimen Description BLOOD BLOOD RIGHT FOREARM  Final   Special Requests   Final    BOTTLES DRAWN AEROBIC AND ANAEROBIC Blood Culture results may not be optimal due to an inadequate volume of blood received in culture bottles   Culture   Final    NO GROWTH 5 DAYS Performed at [facility], [address], [place], [place] [address]    Report Status 02/06/2024 FINAL  Final  Aerobic Culture w Gram Stain (superficial specimen)     Status: None (Preliminary result)   Collection Time: 02/04/24  5:10 PM   Specimen: Wound  Result Value Ref Range Status   Specimen Description   Final    WOUND Performed at [facility], [address] [place], [place] [address]    Special Requests   Final    LIP Performed at [facility], [address] [place], [place] [address]    Gram Stain NO WBC SEEN RARE GRAM POSITIVE RODS   Final   Culture   Final    NO GROWTH < 24 HOURS Performed at [facility], [address], [place], [place] [address]    Report Status PENDING  Incomplete     Radiology Studies: CT Angio Chest Pulmonary Embolism (PE) W or WO Contrast Result Date: 02/04/2024 CLINICAL DATA:  Pulmonary embolism (PE) suspected, high prob. Acute hypoxia. EXAM: CT ANGIOGRAPHY CHEST WITH CONTRAST TECHNIQUE: Multidetector CT imaging of the chest was performed using the standard protocol during bolus administration of intravenous contrast. Multiplanar CT image reconstructions and MIPs were obtained to  evaluate the vascular anatomy. RADIATION DOSE REDUCTION: This exam was performed according to the departmental dose-optimization program which includes automated exposure control, adjustment of the mA and/or kV according to patient size and/or use of iterative reconstruction technique. CONTRAST:  65mL OMNIPAQUE  IOHEXOL  350 MG/ML SOLN COMPARISON:  10/21/2011 FINDINGS: Cardiovascular: No filling defects in the pulmonary arteries to suggest pulmonary emboli. Heart is normal size. Aorta is normal caliber. Scattered coronary artery and aortic calcifications. Mediastinum/Nodes: Mildly prominent mediastinal lymph nodes. Right paratracheal lymph node has a short axis diameter of 1.3 cm. No axillary or hilar adenopathy. Trachea and esophagus are unremarkable. Thyroid  unremarkable. Large hiatal hernia noted containing most of the stomach as well as transverse colon. Lungs/Pleura: Small bilateral pleural effusions. Consolidation in both lower lobes concerning for pneumonia. Upper Abdomen: No acute findings Musculoskeletal: Chest wall soft tissues are unremarkable. No acute bony abnormality. Review of the MIP images confirms the above findings. IMPRESSION: No evidence of pulmonary embolus. Dense consolidation throughout much of the lower lobes most compatible with pneumonia. Small bilateral pleural effusions. Large hiatal hernia containing much of the stomach and transverse colon. Mild mediastinal adenopathy, likely reactive. Aortic Atherosclerosis (ICD10-I70.0). Electronically Signed   By: Franky Crease M.D.   On: 02/04/2024 18:17    Scheduled Meds:  atorvastatin   40 mg Oral QHS   cyanocobalamin   1,000 mcg Oral Daily   methylPREDNISolone  (SOLU-MEDROL ) injection  80 mg Intravenous Q1500   metoprolol  succinate  25 mg Oral  BID   mupirocin  ointment   Nasal BID   potassium chloride   40 mEq Oral Once   QUEtiapine   12.5 mg Oral Daily   Continuous Infusions:  amiodarone  30 mg/hr (02/06/24 0108)   doxycycline  (VIBRAMYCIN )  IV 100 mg (02/05/24 2245)   heparin  550 Units/hr (02/06/24 0513)     LOS: 5 days   CRITICAL CARE Total critical care time: 55 minutes Critical care was time spent personally by me on the following activities: development of treatment plan with patient and/or surrogate as well as nursing, discussions with consultants, evaluation of patient's response to treatment, examination of patient, obtaining history from patient or surrogate, ordering and performing treatments and interventions, ordering and review of laboratory studies, ordering and review of radiographic studies, pulse oximetry and re-evaluation of patient's condition.     Ciella Obi, DO [facility]  To contact the attending physician between 7A-7P please use Epic Chat. To contact the covering physician during after hours 7P-7A, please review Amion.   02/06/2024, 9:25 AM   *This document has been created with the assistance of dictation software. Please excuse typographical errors. *

## 2024-02-06 NOTE — Progress Notes (Addendum)
 Rounding Note    Patient Name: Michelle Barnett Date of Encounter: 02/06/2024  [facility] Cardiologist: None   Subjective   Denies any chest pain or shortness of breath.  Continues to maintain normal sinus rhythm on telemetry.  O2 sats 97% on 5 L  Inpatient Medications    Scheduled Meds:  atorvastatin   40 mg Oral QHS   cyanocobalamin   1,000 mcg Oral Daily   methylPREDNISolone  (SOLU-MEDROL ) injection  80 mg Intravenous Q1500   metoprolol  succinate  25 mg Oral BID   mupirocin  ointment   Nasal BID   potassium chloride   40 mEq Oral Once   QUEtiapine   12.5 mg Oral Daily   Continuous Infusions:  amiodarone  30 mg/hr (02/06/24 0108)   doxycycline  (VIBRAMYCIN ) IV 100 mg (02/05/24 2245)   heparin  550 Units/hr (02/06/24 0513)   PRN Meds: acetaminophen  **OR** acetaminophen , albuterol , hydrALAZINE , ondansetron  **OR** ondansetron  (ZOFRAN ) IV, senna-docusate   Vital Signs    Vitals:   02/05/24 2000 02/05/24 2324 02/06/24 0400 02/06/24 0740  BP: (!) 151/76 (!) 122/55 139/78 132/65  Pulse: 72 68 70 67  Resp: 16 18 16 18   Temp: 98.2 F (36.8 C) 98.4 F (36.9 C) 98.1 F (36.7 C) 98 F (36.7 C)  TempSrc: Oral Oral Oral Axillary  SpO2: 98% 98% 98% 97%  Weight:      Height:        Intake/Output Summary (Last 24 hours) at 02/06/2024 1054 Last data filed at 02/06/2024 0513 Gross per 24 hour  Intake 1250.75 ml  Output 1400 ml  Net -149.25 ml      02/01/2024   12:12 PM 02/01/2024   10:55 AM 09/24/2023    8:25 AM  Last 3 Weights  Weight (lbs) 127 lb -- 122 lb  Weight (kg) 57.607 kg -- 55.339 kg      Telemetry    Normal sinus rhythm- Personally Reviewed  Physical Exam   GEN: Well nourished, well developed in no acute distress HEENT: large bloody scabbed lesions on the mouth, ecchymosis over left eye NECK: No JVD; No carotid bruits LYMPHATICS: No lymphadenopathy CARDIAC:RRR, no murmurs, rubs, gallops RESPIRATORY:  Clear to auscultation without rales, wheezing or  rhonchi  ABDOMEN: Soft, non-tender, non-distended MUSCULOSKELETAL:  No edema; No deformity  SKIN: Warm and dry NEUROLOGIC:  Alert and oriented x 3 PSYCHIATRIC:  Normal affect  Labs    High Sensitivity Troponin:  No results for input(s): TROPONINIHS in the last 720 hours.   Chemistry Recent Labs  Lab 02/04/24 0555 02/05/24 0432 02/06/24 0424  NA 144 141 146*  K 3.2* 3.0* 3.5  CL 101 101 100  CO2 32 31 34*  GLUCOSE 141* 135* 160*  BUN 47* 50* 51*  CREATININE 1.47* 1.41* 1.52*  CALCIUM  8.1* 8.3* 8.9  MG 2.3 2.4 2.2  PROT 5.5* 5.6* 5.6*  ALBUMIN 2.3* 2.5* 2.5*  AST 50* 46* 36  ALT 23 23 21   ALKPHOS 28* 28* 29*  BILITOT 0.2 0.8 0.3  GFRNONAA 40* 42* 38*  ANIONGAP 11 9 12     Lipids  Recent Labs  Lab 02/06/24 0424  CHOL 137  TRIG 115  HDL 39*  LDLCALC 75  CHOLHDL 3.5    Hematology Recent Labs  Lab 02/04/24 0555 02/05/24 0432 02/06/24 0424  WBC 6.5 6.1 6.7  RBC 3.56* 3.52* 3.45*  HGB 11.1* 10.8* 10.7*  HCT 35.4* 33.8* 34.2*  MCV 99.4 96.0 99.1  MCH 31.2 30.7 31.0  MCHC 31.4 32.0 31.3  RDW 15.3 15.0  15.1  PLT 142* 159 194   Thyroid   Recent Labs  Lab 02/02/24 1812 02/03/24 1034  TSH 0.228*  --   FREET4  --  0.88    BNPNo results for input(s): BNP, PROBNP in the last 168 hours.  DDimer No results for input(s): DDIMER in the last 168 hours.   Radiology    ECHOCARDIOGRAM COMPLETE Result Date: 02/02/2024 IMPRESSIONS  1. Left ventricular ejection fraction, by estimation, is 55 to 60%. Left ventricular ejection fraction by PLAX is 56 %. The left ventricle has normal function. The left ventricle has no regional wall motion abnormalities. There is mild left ventricular hypertrophy. Left ventricular diastolic parameters were normal.  2. Right ventricular systolic function is normal. The right ventricular size is moderately enlarged. There is normal pulmonary artery systolic pressure. The estimated right ventricular systolic pressure is 30.0 mmHg.  3. The  mitral valve is normal in structure. Moderate mitral valve regurgitation. No evidence of mitral stenosis.  4. Tricuspid valve regurgitation is moderate.  5. The aortic valve has an indeterminant number of cusps. There is moderate calcification of the aortic valve. Aortic valve regurgitation is not visualized. Moderate aortic valve stenosis. Aortic valve mean gradient measures 22.0 mmHg.  6. The inferior vena cava is normal in size with greater than 50% respiratory variability, suggesting right atrial pressure of 3 mmHg.Electronically signed by Evalene Lunger MD Signature Date/Time: 02/02/2024/4:30:33 PM    Final    DG Chest Port 1 View Result Date: 02/01/2024 IMPRESSION: *Heterogeneous opacities overlying the right mid lower lung zones without significant volume loss, concerning for pneumonia. Follow-up to clearing is recommended. *Subtle blunting of bilateral lateral costophrenic angles may represent trace pleural effusions. Electronically Signed   By: Ree Molt M.D.   On: 02/01/2024 13:11   CT Head Wo Contrast Result Date: 02/01/2024 IMPRESSION: 1. No CT evidence of intracranial injury. 2. No acute facial bone fracture. 3. No acute fracture or traumatic subluxation of the cervical spine. 4. Soft tissue swelling in the periorbital soft tissues on the left and along the left frontal scalp. Electronically Signed   By: Lyndall Gore M.D.   On: 02/01/2024 13:02   CT Maxillofacial Wo Contrast Result Date: 02/01/2024 IMPRESSION: 1. No CT evidence of intracranial injury. 2. No acute facial bone fracture. 3. No acute fracture or traumatic subluxation of the cervical spine. 4. Soft tissue swelling in the periorbital soft tissues on the left and along the left frontal scalp. Electronically Signed   By: Lyndall Gore M.D.   On: 02/01/2024 13:02   CT Cervical Spine Wo Contrast Result Date: 02/01/2024 IMPRESSION: 1. No CT evidence of intracranial injury. 2. No acute facial bone fracture. 3. No acute fracture or  traumatic subluxation of the cervical spine. 4. Soft tissue swelling in the periorbital soft tissues on the left and along the left frontal scalp. Electronically Signed   By: Lyndall Gore M.D.   On: 02/01/2024 13:02    Cardiac Studies   05/27/2022 TTE 1. Left ventricular ejection fraction, by estimation, is 60 to 65%. The  left ventricle has normal function. The left ventricle has no regional  wall motion abnormalities. There is mild left ventricular hypertrophy.  Left ventricular diastolic parameters  are consistent with Grade II diastolic dysfunction (pseudonormalization).   2. Right ventricular systolic function is normal. The right ventricular  size is normal.   3. Left atrial size was mildly dilated.   4. The mitral valve is grossly normal. Mild mitral valve regurgitation.  5. The aortic valve is calcified. Aortic valve regurgitation is not  visualized. Mild aortic valve stenosis.   6. The inferior vena cava is normal in size with greater than 50%  respiratory variability, suggesting right atrial pressure of 3 mmHg.    09/13/2016 [facility] Ost LM to LM lesion, 20 %stenosed. There is no aortic valve stenosis.   1. Mild ostial left main stenosis due to slight kinking of the vessel. Otherwise no evidence of obstructive coronary artery disease. 2. Normal left ventricular end-diastolic pressure with minimal gradient across the aortic valve. Normal ejection fraction by echo.  Patient Profile     Michelle Barnett is a 63 y.o. female with a hx of asthma/COPD, bicuspid aortic valve, non-occlusive CAD, and hypertension who is being seen for the continued evaluation of atrial fibrillation with RVR.   Assessment & Plan    Right middle lobe pneumonia Hypoxia Asthma - Presented 2/4 with cough, dyspnea, and hypoxia - CXR with evidence of pneumonia -  - Management per IM  New onset atrial fibrillation with RVR - Patient's chart reviewed - no known history of atrial fibrillation - Sinus  rhythm on arrival, converted to atrial fibrillation with RVR 2/5 ~1230. Started on IV amiodarone  bolus and infusion with successful conversion to sinus rhythm 2/5.  - now in NSR but had 2 episodes of afib overnight 2/7 - High risk for reoccurrence of atrial fibrillation in the setting of pneumonia  - Echo 2/5 showed LVEF 55-60% without RWMA - Potassium 3.5 and mag 2.2. - Will give K-Dur 40 mEq now to try to maintain potassium> 4 - CHA2DS2VASc 4 - Remains on IV heparin  and would recommend transition to Eliquis   - I think she is stable to come off IV Amio and would start on 200 mg twice daily >>would not recommend use long tern given young age>>would recommend Amio PO for 1 month after discharge and then d/c once she has recovered from her PNA - Continue Toprol -XL 25 mg twice daily  Hypokalemia -K+ 3.5 this am -Replete with K-Dur 40 mEq this a.m. -Check bmet in a.m. -Mag 2.4  Acute kidney injury - SCr decreased from 1.47>>1.41 today but bumped to 1.52 today - Management per IM  Ecchymosis of the left eye - Present on admission secondary to fall at home  Hyperlipidemia - LDL goal < 70 - LDL today 75 and HDL 39 - Continue atorvastatin  40 mg daily and add Zetia  10 mg daily - Will need repeat FLP and ALT in 8 weeks  Essential hypertension - BP controlled - continue Toprol  XL 25mg  BID - PTA Lisinopril  HCT remains on hold due to AKI  Aortic Stenosis -moderate by echo 02/01/34 -follow with yearly echo outp  Mitral Regurgitation -moderate by echo 2/25 -follow with year echo outpt  Nonobstructive CAD -Denies any anginal symptoms -As above continue atorvastatin  40 mg daily and add Zetia  10 mg daily to try to get LDL to goal -Continue Toprol -XL 25 mg twice daily -No ASA due to need for DOAC at discharge  I spent 25 minutes caring for this patient today face to face, ordering and reviewing labs, reviewing records from 2D echo from 02/02/24, [facility] from 2017 , seeing the patient,  documenting in the record  For questions or updates, please contact [facility] Please consult [email or web address] for contact info under        Signed, Wilbert Bihari, MD  02/06/2024, 10:54 AM

## 2024-02-06 NOTE — Progress Notes (Addendum)
 Date of Admission:  02/01/2024      ID: Michelle Barnett is a 63 y.o. female Principal Problem:   Pneumonia of right lower lobe due to infectious organism Active Problems:   Essential hypertension   Vitamin B12 deficiency   Adjustment disorder with mixed anxiety and depressed mood   Acute hypoxic respiratory failure (HCC)   GERD (gastroesophageal reflux disease)   Mild intermittent asthma without complication   Hyperlipidemia   Hypoxia   AKI (acute kidney injury) (HCC)   Ecchymosis of left eye   Sepsis (HCC)   Paroxysmal atrial fibrillation (HCC)   Atrial fibrillation with RVR (HCC)   Hiatal hernia   Pneumonia  Michelle Barnett is a 63 y.o. with a history of HTN, IDA  COPD,  , Presents with fall on Sunday night and was hallucinating, had hematoma left eye, abraded lips, has chronic cough wbnt to her PCP's office on 02/01/24 and pulse ox was low (66%)  and she was sent to ED Had a hematoma above left eye, bruising around left eye Vitals in the ED 100.7, RR20, pulse 113, BP 110/62 Labs revealed Na 132, BUN 71, Cr 1.19, WBC 8.8, HB 13.9, PLT 115 Lactic acid 1 Ct head - no acute findings Blood culture sent Started on ceftriaxone  and azithromycin  for possible pneumonia EKG- RBBB and then Afib Started IV amiodarone  Given IV lasix  without much improvement I am asked to see her for the lip lesions whether it could be RIME, mycoplasma related skin and mucous lesions  Subjective: Pt is still confused But breathing better than before A bit more alert Being cleaned by the nurses  Medications:   atorvastatin   40 mg Oral QHS   cyanocobalamin   1,000 mcg Oral Daily   ezetimibe   10 mg Oral Daily   methylPREDNISolone  (SOLU-MEDROL ) injection  80 mg Intravenous Q1500   metoprolol  succinate  25 mg Oral BID   mupirocin  ointment   Nasal BID   potassium chloride   40 mEq Oral Once   QUEtiapine   12.5 mg Oral Daily    Objective: Vital signs in last 24 hours: Patient Vitals for the past  24 hrs:  BP Temp Temp src Pulse Resp SpO2  02/06/24 0740 132/65 98 F (36.7 C) Axillary 67 18 97 %  02/06/24 0400 139/78 98.1 F (36.7 C) Oral 70 16 98 %  02/05/24 2324 (!) 122/55 98.4 F (36.9 C) Oral 68 18 98 %  02/05/24 2000 (!) 151/76 98.2 F (36.8 C) Oral 72 16 98 %  02/05/24 1547 (!) 119/57 98 F (36.7 C) Oral 64 16 98 %  02/05/24 1317 (!) 160/106 97.9 F (36.6 C) Axillary 82 18 96 %     LDA Foley Central lines Other catheters  PHYSICAL EXAM:  General: a bit more awake, some confusion and delirium, but answers yes  Hematoma lt frontal area- ecchymosis around eye left Perioral lesions  Lungs: b/l air entry, rhonchi Heart: s1s2 Abdomen: Soft, non-tender,not distended. Bowel sounds normal. No masses Extremities: atraumatic, no cyanosis. No edema. No clubbing Skin: No rashes or lesions. Or bruising Lymph: Cervical, supraclavicular normal. Neurologic: cannot assess  Lab Results    Latest Ref Rng & Units 02/06/2024    4:24 AM 02/05/2024    4:32 AM 02/04/2024    5:55 AM  CBC  WBC 4.0 - 10.5 K/uL 6.7  6.1  6.5   Hemoglobin 12.0 - 15.0 g/dL 89.2  89.1  88.8   Hematocrit 36.0 - 46.0 % 34.2  33.8  35.4   Platelets 150 - 400 K/uL 194  159  142        Latest Ref Rng & Units 02/06/2024    4:24 AM 02/05/2024    4:32 AM 02/04/2024    5:55 AM  CMP  Glucose 70 - 99 mg/dL 839  864  858   BUN 8 - 23 mg/dL 51  50  47   Creatinine 0.44 - 1.00 mg/dL 8.47  8.58  8.52   Sodium 135 - 145 mmol/L 146  141  144   Potassium 3.5 - 5.1 mmol/L 3.5  3.0  3.2   Chloride 98 - 111 mmol/L 100  101  101   CO2 22 - 32 mmol/L 34  31  32   Calcium  8.9 - 10.3 mg/dL 8.9  8.3  8.1   Total Protein 6.5 - 8.1 g/dL 5.6  5.6  5.5   Total Bilirubin 0.0 - 1.2 mg/dL 0.3  0.8  0.2   Alkaline Phos 38 - 126 U/L 29  28  28    AST 15 - 41 U/L 36  46  50   ALT 0 - 44 U/L 21  23  23        Microbiology: BC- NG HSV DNA of perioral lesion positive for HSV 1 Studies/Results: CT Angio Chest Pulmonary Embolism (PE) W  or WO Contrast Result Date: 02/04/2024 CLINICAL DATA:  Pulmonary embolism (PE) suspected, high prob. Acute hypoxia. EXAM: CT ANGIOGRAPHY CHEST WITH CONTRAST TECHNIQUE: Multidetector CT imaging of the chest was performed using the standard protocol during bolus administration of intravenous contrast. Multiplanar CT image reconstructions and MIPs were obtained to evaluate the vascular anatomy. RADIATION DOSE REDUCTION: This exam was performed according to the departmental dose-optimization program which includes automated exposure control, adjustment of the mA and/or kV according to patient size and/or use of iterative reconstruction technique. CONTRAST:  65mL OMNIPAQUE  IOHEXOL  350 MG/ML SOLN COMPARISON:  10/21/2011 FINDINGS: Cardiovascular: No filling defects in the pulmonary arteries to suggest pulmonary emboli. Heart is normal size. Aorta is normal caliber. Scattered coronary artery and aortic calcifications. Mediastinum/Nodes: Mildly prominent mediastinal lymph nodes. Right paratracheal lymph node has a short axis diameter of 1.3 cm. No axillary or hilar adenopathy. Trachea and esophagus are unremarkable. Thyroid  unremarkable. Large hiatal hernia noted containing most of the stomach as well as transverse colon. Lungs/Pleura: Small bilateral pleural effusions. Consolidation in both lower lobes concerning for pneumonia. Upper Abdomen: No acute findings Musculoskeletal: Chest wall soft tissues are unremarkable. No acute bony abnormality. Review of the MIP images confirms the above findings. IMPRESSION: No evidence of pulmonary embolus. Dense consolidation throughout much of the lower lobes most compatible with pneumonia. Small bilateral pleural effusions. Large hiatal hernia containing much of the stomach and transverse colon. Mild mediastinal adenopathy, likely reactive. Aortic Atherosclerosis (ICD10-I70.0). Electronically Signed   By: Franky Crease M.D.   On: 02/04/2024 18:17     Assessment/Plan: 62 yr female  presenting with fall, sob X 1 day duration and not feeling well for a few days   ?Acute hypoxic resp failure with hypercapnia- COPD exacerbation/ Community Acquired Pneumonia/Chf was ceftriaxone   ( completed 5 days) and Doxy- and now on Doxy PE NEg  urine legionella /mycoplasma in process   Fall with ecchymosis left eye Hematoma   Acute encephalopathy   Afib with RVR on amiodarone - well controlled  Perioral lesions due to HSV 1  Started valtrex  adjusted to crcl for 5-7 days   Thrombocytopenia resolved   AKI improved and then  uptick -could be from contrast   Discussed the management with the hospitalist

## 2024-02-07 ENCOUNTER — Other Ambulatory Visit (HOSPITAL_COMMUNITY): Payer: Self-pay

## 2024-02-07 ENCOUNTER — Telehealth (HOSPITAL_COMMUNITY): Payer: Self-pay

## 2024-02-07 ENCOUNTER — Inpatient Hospital Stay: Payer: No Typology Code available for payment source

## 2024-02-07 DIAGNOSIS — I480 Paroxysmal atrial fibrillation: Secondary | ICD-10-CM | POA: Diagnosis not present

## 2024-02-07 DIAGNOSIS — B009 Herpesviral infection, unspecified: Secondary | ICD-10-CM | POA: Diagnosis not present

## 2024-02-07 DIAGNOSIS — G9340 Encephalopathy, unspecified: Secondary | ICD-10-CM

## 2024-02-07 DIAGNOSIS — J189 Pneumonia, unspecified organism: Secondary | ICD-10-CM | POA: Diagnosis not present

## 2024-02-07 DIAGNOSIS — J9601 Acute respiratory failure with hypoxia: Secondary | ICD-10-CM | POA: Diagnosis not present

## 2024-02-07 HISTORY — DX: Encephalopathy, unspecified: G93.40

## 2024-02-07 LAB — RESPIRATORY PANEL BY PCR

## 2024-02-07 LAB — COMPREHENSIVE METABOLIC PANEL
ALT: 25 U/L (ref 0–44)
AST: 36 U/L (ref 15–41)
Albumin: 2.5 g/dL — ABNORMAL LOW (ref 3.5–5.0)
Alkaline Phosphatase: 28 U/L — ABNORMAL LOW (ref 38–126)
Anion gap: 7 (ref 5–15)
BUN: 52 mg/dL — ABNORMAL HIGH (ref 8–23)
CO2: 37 mmol/L — ABNORMAL HIGH (ref 22–32)
Calcium: 9.3 mg/dL (ref 8.9–10.3)
Chloride: 105 mmol/L (ref 98–111)
Creatinine, Ser: 1.4 mg/dL — ABNORMAL HIGH (ref 0.44–1.00)
GFR, Estimated: 42 mL/min — ABNORMAL LOW (ref >60)
Glucose, Bld: 107 mg/dL — ABNORMAL HIGH (ref 70–99)
Potassium: 3.9 mmol/L (ref 3.5–5.1)
Sodium: 149 mmol/L — ABNORMAL HIGH (ref 135–145)
Total Bilirubin: 0.3 mg/dL (ref 0.0–1.2)
Total Protein: 5.5 g/dL — ABNORMAL LOW (ref 6.5–8.1)

## 2024-02-07 LAB — VARICELLA-ZOSTER BY PCR: Varicella-Zoster, PCR: NEGATIVE

## 2024-02-07 LAB — PHOSPHORUS: Phosphorus: 1.8 mg/dL — ABNORMAL LOW (ref 2.5–4.6)

## 2024-02-07 LAB — LEGIONELLA PNEUMOPHILA SEROGP 1 UR AG: L. pneumophila Serogp 1 Ur Ag: NEGATIVE

## 2024-02-07 LAB — CBC WITH DIFFERENTIAL/PLATELET
Abs Immature Granulocytes: 0.39 10*3/uL — ABNORMAL HIGH (ref 0.00–0.07)
Basophils Absolute: 0 10*3/uL (ref 0.0–0.1)
Basophils Relative: 0 %
Eosinophils Absolute: 0 10*3/uL (ref 0.0–0.5)
Eosinophils Relative: 0 %
HCT: 34.3 % — ABNORMAL LOW (ref 36.0–46.0)
Hemoglobin: 10.6 g/dL — ABNORMAL LOW (ref 12.0–15.0)
Immature Granulocytes: 5 %
Lymphocytes Relative: 4 %
Lymphs Abs: 0.3 10*3/uL — ABNORMAL LOW (ref 0.7–4.0)
MCH: 30.5 pg (ref 26.0–34.0)
MCHC: 30.9 g/dL (ref 30.0–36.0)
MCV: 98.8 fL (ref 80.0–100.0)
Monocytes Absolute: 0.7 10*3/uL (ref 0.1–1.0)
Monocytes Relative: 10 %
Neutro Abs: 5.8 10*3/uL (ref 1.7–7.7)
Neutrophils Relative %: 81 %
Platelets: 217 10*3/uL (ref 150–400)
RBC: 3.47 MIL/uL — ABNORMAL LOW (ref 3.87–5.11)
RDW: 14.8 % (ref 11.5–15.5)
WBC: 7.2 10*3/uL (ref 4.0–10.5)
nRBC: 0 % (ref 0.0–0.2)

## 2024-02-07 LAB — MAGNESIUM: Magnesium: 2.2 mg/dL (ref 1.7–2.4)

## 2024-02-07 LAB — VITAMIN B1: Vitamin B1 (Thiamine): 94 nmol/L (ref 66.5–200.0)

## 2024-02-07 LAB — HEPARIN LEVEL (UNFRACTIONATED): Heparin Unfractionated: >1.1 [IU]/mL — ABNORMAL HIGH (ref 0.30–0.70)

## 2024-02-07 MED ORDER — MORPHINE SULFATE (PF) 2 MG/ML IV SOLN
2.0000 mg | INTRAVENOUS | Status: DC | PRN
  Administered 2024-02-08: 2 mg via INTRAVENOUS
  Filled 2024-02-07 (×2): qty 1

## 2024-02-07 MED ORDER — DEXTROSE 5 % IV BOLUS
500.0000 mL | Freq: Once | INTRAVENOUS | Status: AC
  Administered 2024-02-07: 500 mL via INTRAVENOUS

## 2024-02-07 MED ORDER — HYDROCODONE-ACETAMINOPHEN 5-325 MG PO TABS
1.0000 | ORAL_TABLET | Freq: Four times a day (QID) | ORAL | Status: DC | PRN
  Administered 2024-02-07 – 2024-02-09 (×4): 1 via ORAL
  Filled 2024-02-07 (×4): qty 1

## 2024-02-07 MED ORDER — ORAL CARE MOUTH RINSE: 15.0000 mL | OROMUCOSAL | Status: DC | PRN

## 2024-02-07 MED ORDER — ACETAMINOPHEN 325 MG PO TABS
650.0000 mg | ORAL_TABLET | Freq: Four times a day (QID) | ORAL | Status: DC | PRN
  Administered 2024-02-07: 650 mg via ORAL
  Filled 2024-02-07: qty 2

## 2024-02-07 MED ORDER — DOXYCYCLINE HYCLATE 100 MG PO TABS
100.0000 mg | ORAL_TABLET | Freq: Two times a day (BID) | ORAL | Status: DC
Start: 2024-02-07 — End: 2024-02-08
  Administered 2024-02-07 – 2024-02-08 (×2): 100 mg via ORAL
  Filled 2024-02-07 (×2): qty 1

## 2024-02-07 NOTE — Progress Notes (Signed)
 Date of Admission:  02/01/2024      ID: Michelle Barnett is a 63 y.o. female Principal Problem:   Pneumonia of right lower lobe due to infectious organism Active Problems:   Essential hypertension   Vitamin B12 deficiency   Adjustment disorder with mixed anxiety and depressed mood   Acute hypoxic respiratory failure (HCC)   GERD (gastroesophageal reflux disease)   Mild intermittent asthma without complication   Hyperlipidemia   Hypoxia   AKI (acute kidney injury) (HCC)   Ecchymosis of left eye   Sepsis (HCC)   Paroxysmal atrial fibrillation (HCC)   Atrial fibrillation with RVR (HCC)   Hiatal hernia   Pneumonia   HSV-1 infection  Michelle Barnett is a 63 y.o. with a history of HTN, IDA  COPD,  , Presents with fall on Sunday night and was hallucinating, had hematoma left eye, abraded lips, has chronic cough wbnt to her PCP's office on 02/01/24 and pulse ox was low (66%)  and she was sent to ED Had a hematoma above left eye, bruising around left eye Vitals in the ED 100.7, RR20, pulse 113, BP 110/62 Labs revealed Na 132, BUN 71, Cr 1.19, WBC 8.8, HB 13.9, PLT 115 Lactic acid 1 Ct head - no acute findings Blood culture sent Started on ceftriaxone  and azithromycin  for possible pneumonia EKG- RBBB and then Afib Started IV amiodarone  Given IV lasix  without much improvement I am asked to see her for the lip lesions whether it could be RIME, mycoplasma related skin and mucous lesions  Subjective: Pt opened her eyes when I called her name She said she is fine when I asked her how she was  But did  ot respond to any other questions  Medications:   amiodarone   200 mg Oral BID   apixaban   2.5 mg Oral BID   atorvastatin   40 mg Oral QHS   cyanocobalamin   1,000 mcg Oral Daily   doxycycline   100 mg Oral Q12H   ezetimibe   10 mg Oral Daily   methylPREDNISolone  (SOLU-MEDROL ) injection  80 mg Intravenous Q1500   metoprolol  succinate  25 mg Oral BID   mupirocin  ointment   Nasal BID    QUEtiapine   12.5 mg Oral Daily   valACYclovir   1,000 mg Oral Daily    Objective: Vital signs in last 24 hours: Patient Vitals for the past 24 hrs:  BP Temp Temp src Pulse Resp SpO2  02/07/24 0815 -- -- -- -- -- 96 %  02/07/24 0749 (!) 173/94 99.3 F (37.4 C) Axillary 81 20 96 %  02/07/24 0400 133/64 97.8 F (36.6 C) Axillary 77 18 97 %  02/07/24 0000 134/67 97.7 F (36.5 C) Axillary 80 18 96 %  02/06/24 2244 (!) 166/78 97.6 F (36.4 C) Axillary 79 20 97 %  02/06/24 2000 129/67 97.7 F (36.5 C) Axillary 81 20 97 %  02/06/24 1800 -- -- -- -- (!) 22 --  02/06/24 1616 135/70 97.6 F (36.4 C) Axillary 78 20 94 %  02/06/24 1217 130/68 97.7 F (36.5 C) Axillary -- 18 98 %     PHYSICAL EXAM:  General: opened her eyes when I called her name and said she is fine but did not follow other commands some confusion and delirium, Hematoma lt frontal area- ecchymosis around eye left Perioral lesions, scabs falling off- no more bleeding  Lungs: b/l air entry, rhonchi Heart: s1s2 systolic murmur Abdomen: Soft, non-tender,not distended. Bowel sounds normal. No masses Extremities: atraumatic, no  cyanosis. No edema. No clubbing Skin: No rashes or lesions. Or bruising Lymph: Cervical, supraclavicular normal. Neurologic: cannot assess Myoclonic jerks upper extremities infrequently  Lab Results    Latest Ref Rng & Units 02/07/2024    5:05 AM 02/06/2024    4:24 AM 02/05/2024    4:32 AM  CBC  WBC 4.0 - 10.5 K/uL 7.2  6.7  6.1   Hemoglobin 12.0 - 15.0 g/dL 16.1  09.6  04.5   Hematocrit 36.0 - 46.0 % 34.3  34.2  33.8   Platelets 150 - 400 K/uL 217  194  159        Latest Ref Rng & Units 02/07/2024    5:05 AM 02/06/2024    4:24 AM 02/05/2024    4:32 AM  CMP  Glucose 70 - 99 mg/dL 409  811  914   BUN 8 - 23 mg/dL 52  51  50   Creatinine 0.44 - 1.00 mg/dL 7.82  9.56  2.13   Sodium 135 - 145 mmol/L 149  146  141   Potassium 3.5 - 5.1 mmol/L 3.9  3.5  3.0   Chloride 98 - 111 mmol/L 105  100  101    CO2 22 - 32 mmol/L 37  34  31   Calcium  8.9 - 10.3 mg/dL 9.3  8.9  8.3   Total Protein 6.5 - 8.1 g/dL 5.5  5.6  5.6   Total Bilirubin 0.0 - 1.2 mg/dL 0.3  0.3  0.8   Alkaline Phos 38 - 126 U/L 28  29  28    AST 15 - 41 U/L 36  36  46   ALT 0 - 44 U/L 25  21  23        Microbiology: BC- NG HSV DNA of perioral lesion positive for HSV 1 Studies/Results: No results found.    Assessment/Plan: 63 yr female presenting with fall, sob X 1 day duration and not feeling well for a few days   ?Acute hypoxic resp failure with hypercapnia- COPD exacerbation/ Community Acquired Pneumonia/Chf was ceftriaxone   ( completed 5 days) and Doxy- and now on Doxy- managed as per hosptialist PE NEg  urine legionella /mycoplasma in process   Fall with ecchymosis left eye Hematoma   Acute encephalopathy Myoclonic jerks- ? ABG to look for co2 narcosis ? 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

## 2024-02-07 NOTE — Evaluation (Signed)
 Physical Therapy Evaluation Patient Details Name: Michelle Barnett MRN: [id number] DOB: Mar 05, 1961 Today's Date: 02/07/2024  History of Present Illness  Pt admitted for complaints of fall and hallucinating. Stay complicated by agitation. History includes GERD, AKI, sepsis, Afib, and HLD.  Clinical Impression  PT/OT co-evaluation this date. Pt is a pleasant 63 year old female who was admitted for pneumonia with falls and hallucinating. Pt performs bed mobility with total assist +2 and able to sit at EOB briefly with +2 total assist. Heavy post leaning. Unable to further perform mobility/ambulation at this time. Pt is currently far removed from baseline. Pt demonstrates deficits with strength/cognition/mobility. Would benefit from skilled PT to address above deficits and promote optimal return to PLOF. Pt will continue to receive skilled PT services while admitted and will defer to TOC/care team for updates regarding disposition planning.       If plan is discharge home, recommend the following: Two people to help with walking and/or transfers;Two people to help with bathing/dressing/bathroom;Supervision due to cognitive status   Can travel by private vehicle   No    Equipment Recommendations  (TBD)  Recommendations for Other Services       Functional Status Assessment Patient has had a recent decline in their functional status and demonstrates the ability to make significant improvements in function in a reasonable and predictable amount of time.     Precautions / Restrictions Precautions Precautions: Fall Restrictions Weight Bearing Restrictions Per Provider Order: No      Mobility  Bed Mobility Overal bed mobility: Needs Assistance Bed Mobility: Rolling, Supine to Sit Rolling: Total assist, +2 for physical assistance   Supine to sit: Total assist, +2 for physical assistance     General bed mobility comments: rolling for hygiene with total assist. Pt then able to sit at EOB,  however heavy post lean, no righting response and unable to follow commands. Returned to bed after approx 2-3 minutes.    Transfers                   General transfer comment: not able to perform    Ambulation/Gait                  Stairs            Wheelchair Mobility     Tilt Bed    Modified Rankin (Stroke Patients Only)       Balance Overall balance assessment: History of Falls, Needs assistance Sitting-balance support: Feet unsupported, Bilateral upper extremity supported Sitting balance-Leahy Scale: Zero                                       Pertinent Vitals/Pain Pain Assessment Pain Assessment: No/denies pain    Home Living Family/patient expects to be discharged to:: Private residence                   Additional Comments: pt is poor historian, unable to answer questions or participate in history taking. Per notes, lives with boyfriend.    Prior Function Prior Level of Function : Patient poor historian/Family not available             Mobility Comments: per notes, pt was working in Art therapist at [employer]. 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 ADLs Comments: unsure- assume indep     Extremity/Trunk Assessment   Upper Extremity Assessment Upper Extremity Assessment: Difficult to assess  due to impaired cognition    Lower Extremity Assessment Lower Extremity Assessment: Difficult to assess due to impaired cognition (keeps B feet in plantarflexion)       Communication   Communication Communication: Difficulty following commands/understanding  Cognition Arousal: Lethargic Behavior During Therapy: Flat affect Overall Cognitive Status: No family/caregiver present to determine baseline cognitive functioning                                 General Comments: lethargic, however inconsistently awakens and answers to name. appears confused        General Comments      Exercises Other Exercises Other  Exercises: pt found soiled in bed. Needs total assist +2 for hygiene and bed linen changed. Sats on 7L of HFNC monitored WNL with exertion. Soiled areas bright red with skin irritation noted   Assessment/Plan    PT Assessment Patient needs continued PT services  PT Problem List Decreased strength;Decreased activity tolerance;Decreased balance;Decreased mobility;Decreased cognition;Decreased safety awareness;Cardiopulmonary status limiting activity       PT Treatment Interventions DME instruction;Gait training;Therapeutic activities;Therapeutic exercise    PT Goals (Current goals can be found in the Care Plan section)  Acute Rehab PT Goals Patient Stated Goal: unable to state PT Goal Formulation: Patient unable to participate in goal setting Time For Goal Achievement: 02/21/24 Potential to Achieve Goals: Fair    Frequency Min 1X/week     Co-evaluation PT/OT/SLP Co-Evaluation/Treatment: Yes Reason for Co-Treatment: For patient/therapist safety;To address functional/ADL transfers PT goals addressed during session: Mobility/safety with mobility OT goals addressed during session: ADL's and self-care       AM-PAC PT "6 Clicks" Mobility  Outcome Measure Help needed turning from your back to your side while in a flat bed without using bedrails?: Total Help needed moving from lying on your back to sitting on the side of a flat bed without using bedrails?: Total Help needed moving to and from a bed to a chair (including a wheelchair)?: Total Help needed standing up from a chair using your arms (e.g., wheelchair or bedside chair)?: Total Help needed to walk in hospital room?: Total Help needed climbing 3-5 steps with a railing? : Total 6 Click Score: 6    End of Session Equipment Utilized During Treatment: Oxygen Activity Tolerance: Patient tolerated treatment well Patient left: in bed;with bed alarm set (chair position in bed) Nurse Communication: Mobility status PT Visit  Diagnosis: Muscle weakness (generalized) (M62.81);History of falling (Z91.81);Difficulty in walking, not elsewhere classified (R26.2)    Time: 1610-9604 PT Time Calculation (min) (ACUTE ONLY): 29 min   Charges:   PT Evaluation $PT Eval Moderate Complexity: 1 Mod PT Treatments $Therapeutic Activity: 8-22 mins PT General Charges $$ ACUTE PT VISIT: 1 Visit         Amparo Balk, PT, DPT, GCS [phone]   Anndee Connett 02/07/2024, 10:38 AM

## 2024-02-07 NOTE — Evaluation (Signed)
 Occupational Therapy Evaluation Patient Details Name: Michelle Barnett MRN: [id number] DOB: 29-Jun-1961 Today's Date: 02/07/2024   History of Present Illness Pt admitted for complaints of fall and hallucinating. Stay complicated by agitation and new onset A-fib RVR. History includes GERD, AKI, sepsis, Afib, and HLD.   Clinical Impression   Pt was seen for PT/OT co-evaluation this date to maximize pt/therapist safety. Prior to hospital admission, pt was not able to provide any history. 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  Pt presents to acute OT demonstrating impaired ADL performance and functional mobility 2/2 weakness, low activity tolerance, decreased cognition, and balance deficits. Pt currently requires total assist x2 for all tasks. She denied pain, but only opened her eyes with max cueing 2-3 times during session. Able to state her name and no further info given. Found soiled on entry and required full linen change, total assist x2 for rolling, dressing, and peri-care at bed level. Sat at EOB with total assist x2, heavy posterior lean and no right reaction for ~2 mins before returning to supine and being scooted to [facility]. On 7L with sp02 remaining stable throughout. Pt would benefit from skilled OT services to address noted impairments and functional limitations to maximize safety and independence. Do anticipate the need for follow up OT services upon acute hospital DC.        If plan is discharge home, recommend the following: Two people to help with walking and/or transfers;Two people to help with bathing/dressing/bathroom;Assist for transportation;Help with stairs or ramp for entrance;Direct supervision/assist for financial management;Supervision due to cognitive status;Direct supervision/assist for medications management;Assistance with cooking/housework    Functional Status Assessment  Patient has had a recent decline in their functional status  and demonstrates the ability to make significant improvements in function in a reasonable and predictable amount of time.  Equipment Recommendations  Other (comment) (defer to next venue)    Recommendations for Other Services       Precautions / Restrictions Precautions Precautions: Fall Restrictions Weight Bearing Restrictions Per Provider Order: No      Mobility Bed Mobility Overal bed mobility: Needs Assistance Bed Mobility: Rolling, Supine to Sit Rolling: Total assist, +2 for physical assistance   Supine to sit: Total assist, +2 for physical assistance     General bed mobility comments: total assist for rolling in bed to perform peri-care; sat at EOB with total assist x2, heavy posterior lean and no righting reaction or ability to follow commands    Transfers                   General transfer comment: not able to perform      Balance Overall balance assessment: History of Falls, Needs assistance Sitting-balance support: Feet unsupported, Bilateral upper extremity supported Sitting balance-Leahy Scale: Zero                                     ADL either performed or assessed with clinical judgement   ADL Overall ADL's : Needs assistance/impaired     Grooming: Wash/dry face;Total assistance;Bed level                       Toileting- Clothing Manipulation and Hygiene: Total assistance;Bed level               Vision  Perception         Praxis         Pertinent Vitals/Pain Pain Assessment Pain Assessment: No/denies pain     Extremity/Trunk Assessment Upper Extremity Assessment Upper Extremity Assessment: Difficult to assess due to impaired cognition   Lower Extremity Assessment Lower Extremity Assessment: Difficult to assess due to impaired cognition       Communication Communication Communication: Difficulty following commands/understanding   Cognition Arousal: Lethargic Behavior During Therapy:  Flat affect Overall Cognitive Status: No family/caregiver present to determine baseline cognitive functioning                                 General Comments: lethargic, however inconsistently awakens and answers to name. appears confused     General Comments  VSS    Exercises     Shoulder Instructions      Home Living Family/patient expects to be discharged to:: Private residence                                 Additional Comments: pt is poor historian, unable to answer questions or participate in history taking. Per notes, lives with boyfriend.      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 ADLs Comments: unsure- assume indep        OT Problem List: Decreased strength;Impaired balance (sitting and/or standing);Decreased cognition;Decreased activity tolerance      OT Treatment/Interventions: Self-care/ADL training;Therapeutic exercise;Patient/family education;Balance training;Therapeutic activities;Energy conservation    OT Goals(Current goals can be found in the care plan section) Acute Rehab OT Goals OT Goal Formulation: Patient unable to participate in goal setting Time For Goal Achievement: 02/21/24 Potential to Achieve Goals: Fair ADL Goals Pt Will Perform Grooming: with contact guard assist;sitting Pt Will Perform Upper Body Bathing: with min assist;sitting Pt Will Transfer to Toilet: with mod assist;stand pivot transfer;bedside commode  OT Frequency: Min 1X/week    Co-evaluation   Reason for Co-Treatment: For patient/therapist safety;To address functional/ADL transfers PT goals addressed during session: Mobility/safety with mobility OT goals addressed during session: ADL's and self-care      AM-PAC OT "6 Clicks" Daily Activity     Outcome Measure Help from  another person eating meals?: A Lot Help from another person taking care of personal grooming?: A Lot Help from another person toileting, which includes using toliet, bedpan, or urinal?: Total Help from another person bathing (including washing, rinsing, drying)?: Total Help from another person to put on and taking off regular upper body clothing?: A Lot Help from another person to put on and taking off regular lower body clothing?: Total 6 Click Score: 9   End of Session Nurse Communication: Mobility status  Activity Tolerance: Patient limited by lethargy Patient left: in bed;with call bell/phone within reach;with bed alarm set  OT Visit Diagnosis: Other abnormalities of gait and mobility (R26.89);Muscle weakness (generalized) (M62.81)                Time: 1610-9604 OT Time Calculation (min): 33 min Charges:  OT General Charges $OT Visit: 1 Visit OT Evaluation $OT Eval Moderate Complexity: 1 Mod Simi Briel, OTR/L 02/07/24, 12:32 PM  Cadden Elizondo E Ellakate Gonsalves 02/07/2024, 12:28 PM

## 2024-02-07 NOTE — Telephone Encounter (Signed)
 Pharmacy Patient Advocate Encounter  Insurance verification completed.    The patient is insured through [employer].     Ran test claim for Eliquis  and the current 30 day co-pay is $50.00.   This test claim was processed through  [facility]- copay amounts may vary at other pharmacies due to pharmacy/plan contracts, or as the patient moves through the different stages of their insurance plan.

## 2024-02-07 NOTE — Evaluation (Signed)
 Clinical/Bedside Swallow Evaluation Patient Details  Name: Michelle Barnett MRN: [id number] Date of Birth: 1961-12-15  Today's Date: 02/07/2024 Time: SLP Start Time (ACUTE ONLY): 1027 SLP Stop Time (ACUTE ONLY): 0920 SLP Time Calculation (min) (ACUTE ONLY): 25 min  Past Medical History:  Past Medical History:  Diagnosis Date   Asthma    Bicuspid aortic valve 09/20/2016   a. echo 09/19/16: EF 55-60%, GR1DD, possible bicuspid aortic valve without evidence of AS   Bronchitis    Coronary artery disease, non-occlusive    a. cath 09/21/16: ostLM to LM 20%, no evidence of aortic stenosis   Demand ischemia (HCC) 09/20/2016   Heart murmur    Hiatal hernia    History of blood transfusion    Hypertension    Persistent cough for 3 weeks or longer 05/19/2022   Ulcer    Past Surgical History:  Past Surgical History:  Procedure Laterality Date   abdominal tumor     ABLATION     CARDIAC CATHETERIZATION N/A 09/21/2016   Procedure: Left Heart Cath and Coronary Angiography;  Surgeon: Wenona Hamilton, MD;  Location: [facility] INVASIVE CV LAB;  Service: Cardiovascular;  Laterality: N/A;   COLONOSCOPY N/A 08/14/2016   Procedure: COLONOSCOPY;  Surgeon: Albertina Hugger, MD;  Location: [facility] ENDOSCOPY;  Service: Endoscopy;  Laterality: N/A;   ESOPHAGOGASTRODUODENOSCOPY N/A 08/13/2016   Procedure: ESOPHAGOGASTRODUODENOSCOPY (EGD);  Surgeon: Albertina Hugger, MD;  Location: [facility] ENDOSCOPY;  Service: Gastroenterology;  Laterality: N/A;   TOOTH EXTRACTION     HPI:  Michelle Barnett is 63 y.o. female with hypertension, hyperlipidemia, iron deficiency anemia, who presents to the ED with acute hypoxia.  Patient was previously in her PCP office where she was noted to be 66% on room air, this improved on 3 L O2 but could not get higher than 88%.  EMS was called and brought the patient to the hospital.  In the ED she was found to be septic with a temperature of 100.7, respirations 20, tachycardic to 113 and hypoxic.  Labs  were mostly unrevealing.  Patient underwent CT head, CT cervical spine, CT maxillofacial which revealed soft tissue swelling of the periorbital soft tissues of the left and along the left frontal scalp.  She received DuoNebs, azithromycin , ceftriaxone . Pt admitted for right middle lobe PNA with acute hypoxic respiratory failure. Pt is currently on 6L nasal canula. Pt with chronic hiatal hernia. CXR 2/7: Ventilation not significantly changed since 02/01/2024. Veiling lung base opacity superimposed on large gastric hiatal hernia. Differential considerations remain pneumonia, atelectasis, pleural effusions. Chest CT 2/7: Small bilateral pleural effusions. Consolidation in both lower lobes concerning for pneumonia. Pt currently on Dys 2 and thin liquids diet.    Assessment / Plan / Recommendation  Clinical Impression  Pt seen for bedside swallow assessment in the setting of concern for aspiration PNA. Upon therapist arrival pt on 6L nasal canula, with O2 saturations maintaining at 93-94 for duration of session. Sacral pain limited upright positioning in bed, with pt agreeable to slight elevation with tilt of bed to approximate upright posture. Oral sores present, pt moaning in pain during oral care secondary to sores. Overall, pt with reduced oral movement (no focal weakness), suspected secondary to condition of oral cavity (specifically lips). Thus, pt with limited labial closure on cup/straw/spoon for liquid administration, leading to anterior loss with cup/spoon and minimal pull on straw. Therapist prompted pt to attempt to hold cup to increase awareness/engagement, without success despite provision of hand over hand  assist. Greatest success for liquid administration was via straw with extended time for completion of sip. Oral phase also impaired for solid manipulation/mastication. Pt with requiring extended time for mastication/clearance of regular solids. No overt or subtle s/sx pharyngeal dysphagia noted. No  change to vocal quality across trials. Vitals stable for duration of trials. Further pt with hiatal hernia, likely leading to esophageal complications/dysphagia. Pt unable to confirm/deny sensation or reflux.      Overall, oral deficits, fluctuating alertness, altered mentation, dependency with feeding, deconditioning, and current respiratory/pulmonary status all increase pt's risk for aspiration. Therefore, recommend STRICT aspiration precautions, including as upright as possible during/after intake, slow rate, small bites, and alert for PO intake. HOLD tray if pt is not alert enough for intake and monitor for sustained alertness t/o meal. Recommend continuation of current Dys 2 and thin liquids. Total assist for meals. Medication whole vs crushed in puree. SLP will monitor for continued safety with current diet vs potential diet advancement. MD and RN aware of recommendations. SLP Visit Diagnosis: Dysphagia, oropharyngeal phase (R13.12) (primarily oral deficits)    Aspiration Risk  Moderate aspiration risk    Diet Recommendation   Thin;Dysphagia 2 (chopped)  Medication Administration: Whole meds with puree (vs Crushed)    Other  Recommendations Recommended Consults: Consider GI evaluation (if indicated for hiatal hernia, dietician consult) Oral Care Recommendations: Oral care BID;Staff/trained caregiver to provide oral care;Oral care before and after PO    Recommendations for follow up therapy are one component of a multi-disciplinary discharge planning process, led by the attending physician.  Recommendations may be updated based on patient status, additional functional criteria and insurance authorization.  Follow up Recommendations Follow physician's recommendations for discharge plan and follow up therapies      Assistance Recommended at Discharge  Full assistance and supervision for meals.   Functional Status Assessment Patient has had a recent decline in their functional status and  demonstrates the ability to make significant improvements in function in a reasonable and predictable amount of time.  Frequency and Duration min 2x/week  2 weeks       Prognosis Prognosis for improved oropharyngeal function: Fair Barriers to Reach Goals: Severity of deficits;Cognitive deficits      Swallow Study   General Date of Onset: 02/07/24 HPI: Michelle Barnett is 63 y.o. female with hypertension, hyperlipidemia, iron deficiency anemia, who presents to the ED with acute hypoxia.  Patient was previously in her PCP office where she was noted to be 66% on room air, this improved on 3 L O2 but could not get higher than 88%.  EMS was called and brought the patient to the hospital.  In the ED she was found to be septic with a temperature of 100.7, respirations 20, tachycardic to 113 and hypoxic.  Labs were mostly unrevealing.  Patient underwent CT head, CT cervical spine, CT maxillofacial which revealed soft tissue swelling of the periorbital soft tissues of the left and along the left frontal scalp.  She received DuoNebs, azithromycin , ceftriaxone . Pt admitted for right middle lobe PNA with acute hypoxic respiratory failure. Pt is currently on 6L nasal canula. Pt with chronic hiatal hernia. CXR 2/7: Ventilation not significantly changed since 02/01/2024. Veiling lung base opacity superimposed on large gastric hiatal hernia. Differential considerations remain pneumonia, atelectasis, pleural effusions. Chest CT 2/7: Small bilateral pleural effusions. Consolidation in both lower lobes concerning for pneumonia. Pt currently on Dys 2 and thin liquids diet. Type of Study: Bedside Swallow Evaluation Previous Swallow Assessment: none  in chart Diet Prior to this Study: Dysphagia 2 (finely chopped);Thin liquids (Level 0) Temperature Spikes Noted: Yes (now- Temp 99.3; WBC 7.2) Respiratory Status: Nasal cannula (6L) History of Recent Intubation: No Behavior/Cognition: Alert;Confused;Requires cueing Oral  Cavity Assessment: Lesions;Other (comment) (lesions/sores across lips) Oral Care Completed by SLP: Yes Oral Cavity - Dentition: Missing dentition;Poor condition Vision:  (no attempts to self feed) Self-Feeding Abilities: Total assist Patient Positioning: Partially reclined;Postural control adequate for testing (with tilt of bed) Baseline Vocal Quality: Low vocal intensity Volitional Cough: Cognitively unable to elicit Volitional Swallow: Unable to elicit    Oral/Motor/Sensory Function Overall Oral Motor/Sensory Function: Generalized oral weakness (specicially reduced labial seal secondary to pain)   Ice Chips Ice chips: Within functional limits Presentation: Spoon   Thin Liquid Thin Liquid: Impaired Presentation: Cup;Spoon;Straw Oral Phase Impairments: Reduced labial seal;Poor awareness of bolus Oral Phase Functional Implications: Left anterior spillage Pharyngeal  Phase Impairments:  (none)    Nectar Thick Nectar Thick Liquid: Not tested   Honey Thick Honey Thick Liquid: Not tested   Puree Puree: Within functional limits   Solid     Solid: Impaired Presentation:  (SLP placed in oral cavity) Oral Phase Impairments: Reduced labial seal;Poor awareness of bolus;Impaired mastication Oral Phase Functional Implications: Impaired mastication;Prolonged oral transit Pharyngeal Phase Impairments:  (none)     [place] Jametta Moorehead Clapp, MS, CCC-SLP Speech Language Pathologist Rehab Services; [facility] [place] [phone] (ascom)   [place] J Clapp 02/07/2024,10:04 AM

## 2024-02-07 NOTE — Plan of Care (Signed)
  Problem: Safety: Goal: Non-violent Restraint(s) Outcome: Progressing   Problem: Education: Goal: Knowledge of General Education information will improve Description: Including pain rating scale, medication(s)/side effects and non-pharmacologic comfort measures Outcome: Progressing   Problem: Health Behavior/Discharge Planning: Goal: Ability to manage health-related needs will improve Outcome: Progressing   Problem: Clinical Measurements: Goal: Ability to maintain clinical measurements within normal limits will improve Outcome: Progressing Goal: Will remain free from infection Outcome: Progressing Goal: Diagnostic test results will improve Outcome: Progressing Goal: Respiratory complications will improve Outcome: Progressing Goal: Cardiovascular complication will be avoided Outcome: Progressing   Problem: Activity: Goal: Risk for activity intolerance will decrease Outcome: Progressing   Problem: Nutrition: Goal: Adequate nutrition will be maintained Outcome: Progressing   Problem: Coping: Goal: Level of anxiety will decrease Outcome: Progressing   Problem: Elimination: Goal: Will not experience complications related to bowel motility Outcome: Progressing Goal: Will not experience complications related to urinary retention Outcome: Progressing   Problem: Pain Managment: Goal: General experience of comfort will improve and/or be controlled Outcome: Progressing   Problem: Safety: Goal: Ability to remain free from injury will improve Outcome: Progressing   Problem: Skin Integrity: Goal: Risk for impaired skin integrity will decrease Outcome: Progressing   Problem: Activity: Goal: Ability to tolerate increased activity will improve Outcome: Progressing   Problem: Clinical Measurements: Goal: Ability to maintain a body temperature in the normal range will improve Outcome: Progressing   Problem: Respiratory: Goal: Ability to maintain adequate ventilation will  improve Outcome: Progressing Goal: Ability to maintain a clear airway will improve Outcome: Progressing

## 2024-02-07 NOTE — Progress Notes (Signed)
 Rounding Note    Patient Name: Michelle Barnett Date of Encounter: 02/07/2024  [facility] Cardiologist: Belva Boyden, MD   Subjective   Patient reports that breathing has improved. Remains on 6 L HFNC. Patient remains in sinus rhythm. Transitioned from IV to oral amiodarone  yesterday with no recurrence of afib.   Inpatient Medications    Scheduled Meds:  amiodarone   200 mg Oral BID   apixaban   2.5 mg Oral BID   atorvastatin   40 mg Oral QHS   cyanocobalamin   1,000 mcg Oral Daily   ezetimibe   10 mg Oral Daily   methylPREDNISolone  (SOLU-MEDROL ) injection  80 mg Intravenous Q1500   metoprolol  succinate  25 mg Oral BID   mupirocin  ointment   Nasal BID   QUEtiapine   12.5 mg Oral Daily   valACYclovir   1,000 mg Oral Daily   Continuous Infusions:  doxycycline  (VIBRAMYCIN ) IV 100 mg (02/07/24 0902)   PRN Meds: acetaminophen , albuterol , HYDROcodone -acetaminophen , morphine  injection, mouth rinse, senna-docusate   Vital Signs    Vitals:   02/07/24 0000 02/07/24 0400 02/07/24 0749 02/07/24 0815  BP: 134/67 133/64 (!) 173/94   Pulse: 80 77 81   Resp: 18 18 20    Temp: 97.7 F (36.5 C) 97.8 F (36.6 C) 99.3 F (37.4 C)   TempSrc: Axillary Axillary Axillary   SpO2: 96% 97% 96% 96%  Weight:      Height:        Intake/Output Summary (Last 24 hours) at 02/07/2024 1012 Last data filed at 02/07/2024 0500 Gross per 24 hour  Intake 860 ml  Output 250 ml  Net 610 ml      02/01/2024   12:12 PM 02/01/2024   10:55 AM 09/24/2023    8:25 AM  Last 3 Weights  Weight (lbs) 127 lb -- 122 lb  Weight (kg) 57.607 kg -- 55.339 kg      Telemetry    Sinus rhythm - Personally Reviewed  Physical Exam   GEN: No acute distress.   HENT: Large scabbed lesions of the mouth, ecchymosis over left eye. No JVD Cardiac: RRR, 2/6 systolic murmur, rubs, or gallops.  Respiratory: Clear to auscultation bilaterally. GI: Soft, nontender, non-distended  MS: No edema; No deformity. Neuro:   Nonfocal  Psych: Normal affect   Labs    High Sensitivity Troponin:  No results for input(s): "TROPONINIHS" in the last 720 hours.   Chemistry Recent Labs  Lab 02/05/24 0432 02/06/24 0424 02/07/24 0505  NA 141 146* 149*  K 3.0* 3.5 3.9  CL 101 100 105  CO2 31 34* 37*  GLUCOSE 135* 160* 107*  BUN 50* 51* 52*  CREATININE 1.41* 1.52* 1.40*  CALCIUM  8.3* 8.9 9.3  MG 2.4 2.2 2.2  PROT 5.6* 5.6* 5.5*  ALBUMIN 2.5* 2.5* 2.5*  AST 46* 36 36  ALT 23 21 25   ALKPHOS 28* 29* 28*  BILITOT 0.8 0.3 0.3  GFRNONAA 42* 38* 42*  ANIONGAP 9 12 7     Lipids  Recent Labs  Lab 02/06/24 0424  CHOL 137  TRIG 115  HDL 39*  LDLCALC 75  CHOLHDL 3.5    Hematology Recent Labs  Lab 02/05/24 0432 02/06/24 0424 02/07/24 0505  WBC 6.1 6.7 7.2  RBC 3.52* 3.45* 3.47*  HGB 10.8* 10.7* 10.6*  HCT 33.8* 34.2* 34.3*  MCV 96.0 99.1 98.8  MCH 30.7 31.0 30.5  MCHC 32.0 31.3 30.9  RDW 15.0 15.1 14.8  PLT 159 194 217   Thyroid   Recent Labs  Lab 02/02/24 1812 02/03/24 1034  TSH 0.228*  --   FREET4  --  0.88    BNPNo results for input(s): "BNP", "PROBNP" in the last 168 hours.  DDimer No results for input(s): "DDIMER" in the last 168 hours.   Radiology    No results found.  Cardiac Studies   05/27/2022 TTE 1. Left ventricular ejection fraction, by estimation, is 60 to 65%. The  left ventricle has normal function. The left ventricle has no regional  wall motion abnormalities. There is mild left ventricular hypertrophy.  Left ventricular diastolic parameters  are consistent with Grade II diastolic dysfunction (pseudonormalization).   2. Right ventricular systolic function is normal. The right ventricular  size is normal.   3. Left atrial size was mildly dilated.   4. The mitral valve is grossly normal. Mild mitral valve regurgitation.   5. The aortic valve is calcified. Aortic valve regurgitation is not  visualized. Mild aortic valve stenosis.   6. The inferior vena cava is normal  in size with greater than 50%  respiratory variability, suggesting right atrial pressure of 3 mmHg.    09/13/2016 LHC Ost LM to LM lesion, 20 %stenosed. There is no aortic valve stenosis.   1. Mild ostial left main stenosis due to slight kinking of the vessel. Otherwise no evidence of obstructive coronary artery disease. 2. Normal left ventricular end-diastolic pressure with minimal gradient across the aortic valve. Normal ejection fraction by echo.  Patient Profile     [name] is a 63 y.o. female with a hx of asthma/COPD, bicuspid aortic valve, non-occlusive CAD, and hypertension who is being seen for the continued evaluation of atrial fibrillation with RVR.   Assessment & Plan    Right middle lobe pneumonia Hypoxia Asthma - Presented 2/4 with cough, dyspnea, and hypoxia - CXR with evidence of pneumonia - Remains on 6 L HFNC - Management per IM  New onset atrial fibrillation with RVR - Patient's chart reviewed, no known history of atrial fibrillation - Sinus rhythm on arrival, converted to atrial fibrillation with RVR 2/5. Started on amiodarone  bolus and infusion with successful conversion to sinus rhythm 2/5. Had 2 episodes of recurrent afib overnight 2/7.  - High risk for recurrence of atrial fibrillation in the setting of PNA - CHA2DS2VASc 4 - Echo 2/5 showed LVEF 55-60% without RWMA - K 3.9 and mag 2.2 - Telemetry shows sinus rhythm - Continue Eliquis  5 mg BID (transitioned yesterday 2/9 from IV heparin ) - Continue amiodarone  200 mg BID (transitioned yesterday 2/9 from IV). Would not recommend long term use given young age. Recommend use for one month after discharge until PNA is resolved then discontinuing.  - Continue Toprol -XL 25 mg BID  Hypokalemia - K 3.9, continue to monitor and replete as needed for K > 4  Acute kidney injury - Cr 1.52>1.4 - Management per IM  Ecchymosis of left eye - Present on admission secondary to fall at home  Hyperlipidemia -  LDL 75, goal < 70 - Continue atorvastatin  40 mg daily and Zetia  10 mg daily (started yesterday 2/9) - Will need repeat FLP and ALT in 8 weeks  Essential hypertension - BP stable - Continue Toprol -XL as above - PTA lisinopril -HCT remains on hold due to AKI  Aortic stenosis Mitral regurgitation - Both moderate on echo 1/5 - Follow with year echo as outpatient  Nonobstructive CAD - LHC 08/2016 showed nonobstructive CAD, details above - Denies anginal symptoms - Continue Toprol  XL, atorvastatin  and Zetia  as  above - No ASA, on Eliquis  as above  For questions or updates, please contact [facility] Please consult [email or web address] for contact info under        Signed, Brodie Cannon, PA-C  02/07/2024, 10:12 AM

## 2024-02-07 NOTE — Progress Notes (Signed)
 Michelle Barnett    Michelle Barnett  NWG:[id number] DOB: 04-01-1961 DOA: 02/01/2024 PCP: Gabriel John, NP  Chief Complaint  Patient presents with   Respiratory Distress    Hospital Course:  Michelle Barnett is 63 y.o. female with hypertension, hyperlipidemia, iron deficiency anemia, who presents to the ED with acute hypoxia.  Patient was previously in her PCP office where she was noted to be 66% on room air, this improved on 3 L O2 but could not get higher than 88%.  EMS was called and brought the patient to the hospital.  In the ED she was found to be septic with a temperature of 100.7, respirations 20, tachycardic to 113 and hypoxic.  Labs were mostly unrevealing.  Patient underwent CT head, CT cervical spine, CT maxillofacial which revealed soft tissue swelling of the periorbital soft tissues of the left and along the left frontal scalp.  She received DuoNebs, azithromycin , ceftriaxone .  Subjective: No acute events overnight. On evaluation today patient is more alert. Denies pain or problems.   Objective: Vitals:   02/07/24 0000 02/07/24 0400 02/07/24 0749 02/07/24 0815  BP: 134/67 133/64 (!) 173/94   Pulse: 80 77 81   Resp: 18 18 20    Temp: 97.7 F (36.5 C) 97.8 F (36.6 C) 99.3 F (37.4 C)   TempSrc: Axillary Axillary Axillary   SpO2: 96% 97% 96% 96%  Weight:      Height:        Intake/Output Summary (Last 24 hours) at 02/07/2024 1022 Last data filed at 02/07/2024 0500 Gross per 24 hour  Intake 860 ml  Output 250 ml  Net 610 ml   Filed Weights   02/01/24 1212  Weight: 57.6 kg    Examination: General exam: Appears calm and comfortable, NAD  Respiratory system: tachypnea, crackles lower lung bases. Cardiovascular system:  irregular rhythm Gastrointestinal system: Abdomen is nondistended, soft and nontender.  Neuro: disoriented, requires prompting for questions. Extremities: Symmetric, expected ROM Skin: lip scabbing improving significantly, no acute bleeding,  no new lesions   psychiatry: calm.  Mood and affect appropriate.    Assessment & Plan:  Principal Problem:   Pneumonia of right lower lobe due to infectious organism Active Problems:   AKI (acute kidney injury) (HCC)   Essential hypertension   Vitamin B12 deficiency   Adjustment disorder with mixed anxiety and depressed mood   Acute hypoxic respiratory failure (HCC)   GERD (gastroesophageal reflux disease)   Mild intermittent asthma without complication   Hyperlipidemia   Hypoxia   Ecchymosis of left eye   Sepsis (HCC)   Paroxysmal atrial fibrillation (HCC)   Atrial fibrillation with RVR (HCC)   Hiatal hernia   Pneumonia   HSV-1 infection     Right middle lobe pneumonia - Was on azithromycin  and ceftriaxone , now changed to doxycycline  in light of amiodarone  - Procalcitonin 0.4 - CTA small pleural effusions and pneumonia. - Continue flutter valve and incentive spirometry - Continue supplemental O2 and wean as tolerated  Acute hypoxic respiratory failure - Multifactorial:  right middle lobe pneumonia, also some evidence of vascular congestion/pulmonary edema on chest x-ray.  Echo does not reveal evidence of heart failure or diastolic dysfunction.  Status post trial of Lasix , minimal improvement on chest x-ray. Given persistent effusions received IV lasix  again on 2/8, subsequently had worsening Cr and some contraction alkalosis. - CTA small pleural effusions and pneumonia. - Continue supplemental O2, maintain sats above 92% -- ABG 2/6 confirmed respiratory acidosis with metabolic compensation.  Patient underwent BiPAP trial and had significant improvement on repeat ABG. - Currently on high flow nasal cannula, will continue to wean as tolerated.  Intermittent BiPAP as needed - Continuous pulse ox - Patient meant to be on anuity inhaler outpatient but is not taking.  Hyponatremia Contraction alkalosis - Patient has received diuresis for pulmonary edema, she appears  clinically dry and labs are worsening.  Will proceed with small D5W bolus today at slow drip rate  Chronic hiatal hernia - Quite large, seen on CXR.  Likely contributing to atelectasis of left lung, initially obscuring additional pathology.  Observe for now.  Would benefit from general surgery consultation outpatient  A-fib RVR - Intermittent - Likely exacerbated by hypoxia, and breathing treatments. - Doing well now on PO Amio and Eliquis  (2.5mg  given weight and Cr) -- TSH low, T4 WNL - Keep tight control of electrolytes - Appreciate cardiology consult - CHA2DS2-VASc: 4, currently on IV heparin  - Echo without RWMA, EF 55 to 60%.  No diastolic or valvular dysfunction noted.  Hypomagnesemia Hypophosphatemia - Replace as needed  Asthma exacerbation - Start prednisone . - Continue DuoNebs 4 times daily - Albuterol  as needed for wheezing  Acute metabolic encephalopathy - Likely superimposed on history of dementia.  Acute delirium multifactorial from hypoxia and sepsis - Status post one-time dose of Haldol , scheduled low dose seroquel , now on wrist restraints.  Family members at bedside and have been educated. Doing better daily.   AKI - Presumed prerenal in setting of poor p.o. intake, worsening contraction alkalosis today in setting of increased lasix  yesterday  - Baseline creatinine appears to be 0.57 - Status post IV fluids - Creatinine improved - Avoid nephrotoxic meds - Renally dose when needed  Ecchymosis of left eye and forehead - Present on admission secondary to fall at home - No intracranial abnormalities  Perioral blood encrusted lesions - Boyfriend reports this is secondary to fall, but have been getting worse since admission -- Pt does appear to be biting these areas and she has been on heparin , which is certainly exacerbating the oozing -- Initially had honeycrusting lesions, thought to be impetigo treated with mupirocin . Lesions have improved significantly since  initiation of mupirocin . -- HSV now positive - start valtrex . No mucosal involvement, no lesions in other locations.  -- VSV neg, mycoplasma IgM pending - Gram stain with few gram+ rods, no significant growth ---HIV negative  Hypertension - Lisinopril  and HCTZ held at time of admission for AKI. - Continue to monitor, titrate as needed  Memory loss, intermittent delirium - Patient's boyfriend at bedside reports that she has had increasing difficulty with memory outpatient.  He recently took her to PCP for this evaluation - HIV negative, RPR nonreactive, B12 elevated, TSH low, T4 WNL, thiamine  pending - Head CT negative for acute intracranial abnormalities, may benefit from brain MRI outpatient with formal neuropsych testing. - Continue with frequent reorientation, delirium precautions - Scheduled low-dose Seroquel  in the morning, as needed Haldol .  Wrist restraints as needed as patient has continuously taken out IVs during this admission and does not have consistent p.o. intake  DVT prophylaxis: Heparin  infusion   Code Status: Full Code Family Communication: none at bedside today.  Disposition:  Status is: Inpatient Remains inpatient appropriate because: Workup ongoing, not medically cleared yet.  May eventually require discharge to SNF.  Will consult PT/OT today.   Consultants:  Cardiology  Treatment Team:  Consulting Physician: Devorah Fonder, MD  Procedures:  N/a  Antimicrobials:  Anti-infectives (From  admission, onward)    Start     Dose/Rate Route Frequency Ordered Stop   02/06/24 1515  valACYclovir  (VALTREX ) tablet 1,000 mg        1,000 mg Oral Daily 02/06/24 1424     02/03/24 1000  doxycycline  (VIBRAMYCIN ) 100 mg in sodium chloride  0.9 % 250 mL IVPB        100 mg 125 mL/hr over 120 Minutes Intravenous Every 12 hours 02/02/24 1328     02/02/24 1100  azithromycin  (ZITHROMAX ) 500 mg in sodium chloride  0.9 % 250 mL IVPB  Status:  Discontinued        500 mg 250 mL/hr over  60 Minutes Intravenous Every 24 hours 02/01/24 1402 02/02/24 1328   02/02/24 1000  cefTRIAXone  (ROCEPHIN ) 2 g in sodium chloride  0.9 % 100 mL IVPB        2 g 200 mL/hr over 30 Minutes Intravenous Every 24 hours 02/01/24 1402 02/05/24 1832   02/01/24 1230  cefTRIAXone  (ROCEPHIN ) 2 g in sodium chloride  0.9 % 100 mL IVPB        2 g 200 mL/hr over 30 Minutes Intravenous  Once 02/01/24 1218 02/01/24 1309   02/01/24 1230  azithromycin  (ZITHROMAX ) 500 mg in sodium chloride  0.9 % 250 mL IVPB        500 mg 250 mL/hr over 60 Minutes Intravenous  Once 02/01/24 1218 02/01/24 1421       Data Reviewed: I have personally reviewed following labs and imaging studies CBC: Recent Labs  Lab 02/01/24 1221 02/02/24 0437 02/03/24 0550 02/04/24 0555 02/05/24 0432 02/06/24 0424 02/07/24 0505  WBC 8.8   < > 7.3 6.5 6.1 6.7 7.2  NEUTROABS 7.5  --   --  5.8 5.1 5.7 5.8  HGB 13.9   < > 12.8 11.1* 10.8* 10.7* 10.6*  HCT 41.5   < > 40.2 35.4* 33.8* 34.2* 34.3*  MCV 92.6   < > 97.3 99.4 96.0 99.1 98.8  PLT 115*   < > 114* 142* 159 194 217   < > = values in this interval not displayed.   Basic Metabolic Panel: Recent Labs  Lab 02/03/24 1034 02/04/24 0555 02/05/24 0432 02/06/24 0424 02/07/24 0505  NA 142 144 141 146* 149*  K 3.3* 3.2* 3.0* 3.5 3.9  CL 100 101 101 100 105  CO2 34* 32 31 34* 37*  GLUCOSE 141* 141* 135* 160* 107*  BUN 33* 47* 50* 51* 52*  CREATININE 0.90 1.47* 1.41* 1.52* 1.40*  CALCIUM  8.1* 8.1* 8.3* 8.9 9.3  MG 2.4 2.3 2.4 2.2 2.2  PHOS 3.1 3.4 2.6 3.7 1.8*   GFR: Estimated Creatinine Clearance: 30 mL/min (A) (by C-G formula based on SCr of 1.4 mg/dL (H)). Liver Function Tests: Recent Labs  Lab 02/03/24 1034 02/04/24 0555 02/05/24 0432 02/06/24 0424 02/07/24 0505  AST 60* 50* 46* 36 36  ALT 24 23 23 21 25   ALKPHOS 27* 28* 28* 29* 28*  BILITOT 0.5 0.2 0.8 0.3 0.3  PROT 5.5* 5.5* 5.6* 5.6* 5.5*  ALBUMIN 2.3* 2.3* 2.5* 2.5* 2.5*   CBG: Recent Labs  Lab 02/04/24 2054  02/04/24 2334  GLUCAP 146* 142*    Recent Results (from the past 240 hours)  Resp panel by RT-PCR (RSV, Flu A&B, Covid) Anterior Nasal Swab     Status: None   Collection Time: 02/01/24 12:21 PM   Specimen: Anterior Nasal Swab  Result Value Ref Range Status   SARS Coronavirus 2 by RT PCR NEGATIVE NEGATIVE Final  Comment: (Barnett) SARS-CoV-2 target nucleic acids are NOT DETECTED.  The SARS-CoV-2 RNA is generally detectable in upper respiratory specimens during the acute phase of infection. The lowest concentration of SARS-CoV-2 viral copies this assay can detect is 138 copies/mL. A negative result does not preclude SARS-Cov-2 infection and should not be used as the sole basis for treatment or other patient management decisions. A negative result may occur with  improper specimen collection/handling, submission of specimen other than nasopharyngeal swab, presence of viral mutation(s) within the areas targeted by this assay, and inadequate number of viral copies(<138 copies/mL). A negative result must be combined with clinical observations, patient history, and epidemiological information. The expected result is Negative.  Fact Sheet for Patients:  BloggerCourse.com  Fact Sheet for Healthcare Providers:  SeriousBroker.it  This test is no t yet approved or cleared by the [place]  FDA and  has been authorized for detection and/or diagnosis of SARS-CoV-2 by FDA under an Emergency Use Authorization (EUA). This EUA will remain  in effect (meaning this test can be used) for the duration of the COVID-19 declaration under Section 564(b)(1) of the Act, 21 U.S.C.section 360bbb-3(b)(1), unless the authorization is terminated  or revoked sooner.       Influenza A by PCR NEGATIVE NEGATIVE Final   Influenza B by PCR NEGATIVE NEGATIVE Final    Comment: (Barnett) The Xpert Xpress SARS-CoV-2/FLU/RSV plus assay is intended as an aid in the  diagnosis of influenza from Nasopharyngeal swab specimens and should not be used as a sole basis for treatment. Nasal washings and aspirates are unacceptable for Xpert Xpress SARS-CoV-2/FLU/RSV testing.  Fact Sheet for Patients: BloggerCourse.com  Fact Sheet for Healthcare Providers: SeriousBroker.it  This test is not yet approved or cleared by the [place]  FDA and has been authorized for detection and/or diagnosis of SARS-CoV-2 by FDA under an Emergency Use Authorization (EUA). This EUA will remain in effect (meaning this test can be used) for the duration of the COVID-19 declaration under Section 564(b)(1) of the Act, 21 U.S.C. section 360bbb-3(b)(1), unless the authorization is terminated or revoked.     Resp Syncytial Virus by PCR NEGATIVE NEGATIVE Final    Comment: (Barnett) Fact Sheet for Patients: BloggerCourse.com  Fact Sheet for Healthcare Providers: SeriousBroker.it  This test is not yet approved or cleared by the [place]  FDA and has been authorized for detection and/or diagnosis of SARS-CoV-2 by FDA under an Emergency Use Authorization (EUA). This EUA will remain in effect (meaning this test can be used) for the duration of the COVID-19 declaration under Section 564(b)(1) of the Act, 21 U.S.C. section 360bbb-3(b)(1), unless the authorization is terminated or revoked.  Performed at [facility], [address] [place], [place] [address]   Blood Culture (routine x 2)     Status: None   Collection Time: 02/01/24 12:21 PM   Specimen: BLOOD  Result Value Ref Range Status   Specimen Description BLOOD LEFT ANTECUBITAL  Final   Special Requests   Final    BOTTLES DRAWN AEROBIC AND ANAEROBIC Blood Culture results may not be optimal due to an inadequate volume of blood received in culture bottles   Culture   Final    NO GROWTH 5 DAYS Performed at  [facility], [address] [place], [place] [address]    Report Status 02/06/2024 FINAL  Final  Blood Culture (routine x 2)     Status: None   Collection Time: 02/01/24 12:21 PM   Specimen: BLOOD  Result Value Ref  Range Status   Specimen Description BLOOD BLOOD RIGHT FOREARM  Final   Special Requests   Final    BOTTLES DRAWN AEROBIC AND ANAEROBIC Blood Culture results may not be optimal due to an inadequate volume of blood received in culture bottles   Culture   Final    NO GROWTH 5 DAYS Performed at [facility], [address] [place], [place] [address]    Report Status 02/06/2024 FINAL  Final  Varicella-zoster by PCR     Status: None   Collection Time: 02/04/24  5:10 PM   Specimen: Lesion; Sterile Swab  Result Value Ref Range Status   Varicella-Zoster, PCR Negative Negative Final    Comment: (Barnett) No Varicella Zoster Virus DNA detected. This test was developed and its performance characteristics determined by [facility].  It has not been cleared or approved by the Food and Drug Administration.  The FDA has determined that such clearance or approval is not necessary. Performed At: [facility] [address] [place], [place] [id number] Pearlean Botts MD MW:[id number]   Aerobic Culture w Gram Stain (superficial specimen)     Status: None (Preliminary result)   Collection Time: 02/04/24  5:10 PM   Specimen: Wound  Result Value Ref Range Status   Specimen Description   Final    WOUND Performed at [facility], [address], [place], [place] [address]    Special Requests   Final    LIP Performed at [facility], [address] [place], [place] [address]    Gram Stain NO WBC SEEN RARE GRAM POSITIVE RODS   Final   Culture   Final    CULTURE REINCUBATED FOR BETTER GROWTH Performed at [facility], [address], [place], [place] [address]    Report Status PENDING  Incomplete  Respiratory (~20 pathogens) panel by  PCR     Status: None   Collection Time: 02/06/24  4:15 PM   Specimen: Nasopharyngeal Swab; Respiratory  Result Value Ref Range Status   Adenovirus NOT DETECTED NOT DETECTED Final   Coronavirus 229E NOT DETECTED NOT DETECTED Final    Comment: (Barnett) The Coronavirus on the Respiratory Panel, DOES NOT test for the novel  Coronavirus (2019 nCoV)    Coronavirus HKU1 NOT DETECTED NOT DETECTED Final   Coronavirus NL63 NOT DETECTED NOT DETECTED Final   Coronavirus OC43 NOT DETECTED NOT DETECTED Final   Metapneumovirus NOT DETECTED NOT DETECTED Final   Rhinovirus / Enterovirus NOT DETECTED NOT DETECTED Final   Influenza A NOT DETECTED NOT DETECTED Final   Influenza B NOT DETECTED NOT DETECTED Final   Parainfluenza Virus 1 NOT DETECTED NOT DETECTED Final   Parainfluenza Virus 2 NOT DETECTED NOT DETECTED Final   Parainfluenza Virus 3 NOT DETECTED NOT DETECTED Final   Parainfluenza Virus 4 NOT DETECTED NOT DETECTED Final   Respiratory Syncytial Virus NOT DETECTED NOT DETECTED Final   Bordetella pertussis NOT DETECTED NOT DETECTED Final   Bordetella Parapertussis NOT DETECTED NOT DETECTED Final   Chlamydophila pneumoniae NOT DETECTED NOT DETECTED Final   Mycoplasma pneumoniae NOT DETECTED NOT DETECTED Final    Comment: Performed at [facility], [address], [place], [place] [address]     Radiology Studies: No results found.   Scheduled Meds:  amiodarone   200 mg Oral BID   apixaban   2.5 mg Oral BID   atorvastatin   40 mg Oral QHS   cyanocobalamin   1,000 mcg Oral Daily   ezetimibe   10  mg Oral Daily   methylPREDNISolone  (SOLU-MEDROL ) injection  80 mg Intravenous Q1500   metoprolol  succinate  25 mg Oral BID   mupirocin  ointment   Nasal BID   QUEtiapine   12.5 mg Oral Daily   valACYclovir   1,000 mg Oral Daily   Continuous Infusions:  doxycycline  (VIBRAMYCIN ) IV 100 mg (02/07/24 0902)     LOS: 6 days   CRITICAL CARE Total critical care time: 55 minutes Critical care was time  spent personally by me on the following activities: development of treatment plan with patient and/or surrogate as well as nursing, discussions with consultants, evaluation of patient's response to treatment, examination of patient, obtaining history from patient or surrogate, ordering and performing treatments and interventions, ordering and review of laboratory studies, ordering and review of radiographic studies, pulse oximetry and re-evaluation of patient's condition.     Belkys Henault, DO [facility]  To contact the attending physician between 7A-7P please use Epic Chat. To contact the covering physician during after hours 7P-7A, please review Amion.   02/07/2024, 10:22 AM   *This document has been created with the assistance of dictation software. Please excuse typographical errors. *

## 2024-02-08 DIAGNOSIS — A419 Sepsis, unspecified organism: Secondary | ICD-10-CM

## 2024-02-08 DIAGNOSIS — G9340 Encephalopathy, unspecified: Secondary | ICD-10-CM | POA: Diagnosis not present

## 2024-02-08 DIAGNOSIS — S0512XA Contusion of eyeball and orbital tissues, left eye, initial encounter: Secondary | ICD-10-CM | POA: Diagnosis not present

## 2024-02-08 DIAGNOSIS — J4520 Mild intermittent asthma, uncomplicated: Secondary | ICD-10-CM

## 2024-02-08 DIAGNOSIS — I10 Essential (primary) hypertension: Secondary | ICD-10-CM

## 2024-02-08 DIAGNOSIS — J189 Pneumonia, unspecified organism: Secondary | ICD-10-CM | POA: Diagnosis not present

## 2024-02-08 DIAGNOSIS — I4891 Unspecified atrial fibrillation: Secondary | ICD-10-CM | POA: Diagnosis not present

## 2024-02-08 DIAGNOSIS — B009 Herpesviral infection, unspecified: Secondary | ICD-10-CM | POA: Diagnosis not present

## 2024-02-08 DIAGNOSIS — J9601 Acute respiratory failure with hypoxia: Secondary | ICD-10-CM | POA: Diagnosis not present

## 2024-02-08 DIAGNOSIS — K449 Diaphragmatic hernia without obstruction or gangrene: Secondary | ICD-10-CM

## 2024-02-08 LAB — BLOOD GAS, ARTERIAL
Acid-Base Excess: 20.3 mmol/L — ABNORMAL HIGH (ref 0.0–2.0)
Bicarbonate: 48 mmol/L — ABNORMAL HIGH (ref 20.0–28.0)
O2 Content: 7 L/min
O2 Saturation: 99 %
Patient temperature: 37
pCO2 arterial: 66 mm[Hg] (ref 32–48)
pH, Arterial: 7.47 — ABNORMAL HIGH (ref 7.35–7.45)
pO2, Arterial: 102 mm[Hg] (ref 83–108)

## 2024-02-08 LAB — CBC WITH DIFFERENTIAL/PLATELET
Abs Immature Granulocytes: 0.15 10*3/uL — ABNORMAL HIGH (ref 0.00–0.07)
Basophils Absolute: 0 10*3/uL (ref 0.0–0.1)
Basophils Relative: 0 %
Eosinophils Absolute: 0 10*3/uL (ref 0.0–0.5)
Eosinophils Relative: 0 %
HCT: 36.7 % (ref 36.0–46.0)
Hemoglobin: 11.4 g/dL — ABNORMAL LOW (ref 12.0–15.0)
Immature Granulocytes: 2 %
Lymphocytes Relative: 5 %
Lymphs Abs: 0.3 10*3/uL — ABNORMAL LOW (ref 0.7–4.0)
MCH: 31.1 pg (ref 26.0–34.0)
MCHC: 31.1 g/dL (ref 30.0–36.0)
MCV: 100 fL (ref 80.0–100.0)
Monocytes Absolute: 0.4 10*3/uL (ref 0.1–1.0)
Monocytes Relative: 6 %
Neutro Abs: 5.4 10*3/uL (ref 1.7–7.7)
Neutrophils Relative %: 87 %
Platelets: 219 10*3/uL (ref 150–400)
RBC: 3.67 MIL/uL — ABNORMAL LOW (ref 3.87–5.11)
RDW: 14.4 % (ref 11.5–15.5)
WBC: 6.3 10*3/uL (ref 4.0–10.5)
nRBC: 0 % (ref 0.0–0.2)

## 2024-02-08 LAB — COMPREHENSIVE METABOLIC PANEL
ALT: 41 U/L (ref 0–44)
AST: 60 U/L — ABNORMAL HIGH (ref 15–41)
Albumin: 2.6 g/dL — ABNORMAL LOW (ref 3.5–5.0)
Alkaline Phosphatase: 31 U/L — ABNORMAL LOW (ref 38–126)
Anion gap: 6 (ref 5–15)
BUN: 54 mg/dL — ABNORMAL HIGH (ref 8–23)
CO2: 40 mmol/L — ABNORMAL HIGH (ref 22–32)
Calcium: 8.9 mg/dL (ref 8.9–10.3)
Chloride: 103 mmol/L (ref 98–111)
Creatinine, Ser: 1.26 mg/dL — ABNORMAL HIGH (ref 0.44–1.00)
GFR, Estimated: 48 mL/min — ABNORMAL LOW (ref >60)
Glucose, Bld: 150 mg/dL — ABNORMAL HIGH (ref 70–99)
Potassium: 3.7 mmol/L (ref 3.5–5.1)
Sodium: 149 mmol/L — ABNORMAL HIGH (ref 135–145)
Total Bilirubin: 0.7 mg/dL (ref 0.0–1.2)
Total Protein: 5.3 g/dL — ABNORMAL LOW (ref 6.5–8.1)

## 2024-02-08 LAB — AEROBIC CULTURE W GRAM STAIN (SUPERFICIAL SPECIMEN): Gram Stain: NONE SEEN

## 2024-02-08 LAB — MYCOPLASMA PNEUMONIAE ANTIBODY, IGM: Mycoplasma pneumo IgM: <770 U/mL (ref 0–769)

## 2024-02-08 LAB — PHOSPHORUS: Phosphorus: 1.9 mg/dL — ABNORMAL LOW (ref 2.5–4.6)

## 2024-02-08 LAB — MAGNESIUM: Magnesium: 2.1 mg/dL (ref 1.7–2.4)

## 2024-02-08 MED ORDER — VALACYCLOVIR HCL 500 MG PO TABS
1000.0000 mg | ORAL_TABLET | Freq: Two times a day (BID) | ORAL | Status: DC
Start: 2024-02-08 — End: 2024-02-13
  Administered 2024-02-08: 1000 mg via ORAL
  Filled 2024-02-08 (×3): qty 2

## 2024-02-08 MED ORDER — APIXABAN 5 MG PO TABS
5.0000 mg | ORAL_TABLET | Freq: Two times a day (BID) | ORAL | Status: DC
Start: 2024-02-08 — End: 2024-02-09
  Administered 2024-02-08 (×2): 5 mg via ORAL
  Filled 2024-02-08 (×3): qty 1

## 2024-02-08 MED ORDER — LISINOPRIL 5 MG PO TABS
20.0000 mg | ORAL_TABLET | Freq: Every day | ORAL | Status: DC
  Administered 2024-02-08: 20 mg via ORAL
  Filled 2024-02-08 (×2): qty 1

## 2024-02-08 MED ORDER — IPRATROPIUM-ALBUTEROL 0.5-2.5 (3) MG/3ML IN SOLN
3.0000 mL | Freq: Four times a day (QID) | RESPIRATORY_TRACT | Status: DC
  Administered 2024-02-08 (×2): 3 mL via RESPIRATORY_TRACT
  Filled 2024-02-08 (×2): qty 3

## 2024-02-08 MED ORDER — METHYLPREDNISOLONE SODIUM SUCC 40 MG IJ SOLR
40.0000 mg | Freq: Every day | INTRAMUSCULAR | Status: AC
  Administered 2024-02-08 – 2024-02-10 (×3): 40 mg via INTRAVENOUS
  Filled 2024-02-08 (×3): qty 1

## 2024-02-08 MED ORDER — LABETALOL HCL 5 MG/ML IV SOLN
10.0000 mg | INTRAVENOUS | Status: DC | PRN
  Administered 2024-02-08: 10 mg via INTRAVENOUS
  Filled 2024-02-08: qty 4

## 2024-02-08 MED ORDER — HYDRALAZINE HCL 20 MG/ML IJ SOLN
10.0000 mg | Freq: Four times a day (QID) | INTRAMUSCULAR | Status: DC | PRN
  Administered 2024-02-08 – 2024-02-13 (×2): 10 mg via INTRAVENOUS
  Filled 2024-02-08 (×2): qty 1

## 2024-02-08 NOTE — Progress Notes (Addendum)
PROGRESS NOTE    Michelle Barnett  ZOX:[id number] DOB: May 14, 1961 DOA: 02/01/2024 PCP: Doreene Nest, NP  Chief Complaint  Patient presents with   Respiratory Distress    Hospital Course:  Michelle Barnett is 63 y.o. female with hypertension, hyperlipidemia, iron deficiency anemia, who presents to the ED with acute hypoxia.  Patient was previously in her PCP office where she was noted to be 66% on room air, this improved on 3 L O2 but could not get higher than 88%.  EMS was called and brought the patient to the hospital.  In the ED she was found to be septic with a temperature of 100.7, respirations 20, tachycardic to 113 and hypoxic.  Labs were mostly unrevealing.  Patient underwent CT head, CT cervical spine, CT maxillofacial which revealed soft tissue swelling of the periorbital soft tissues of the left and along the left frontal scalp.  She received DuoNebs, azithromycin, ceftriaxone.  Hospital stay has been prolonged by ongoing hypoxia, inability to wean oxygen, and persistent delirium.  Patient has now completed her course of antibiotics.  We have initiated steroid taper, but she remains on 6 L nasal cannula.  Pulmonology has been consulted to assist. Patient's stay was also complicated by A-fib RVR necessitating cardiology consult, amiodarone drip and heparin drip which have since been transitioned to Eliquis and amiodarone p.o.  A-fib RVR likely provoked by hypoxia and breathing treatments. Patient stay is also been complicated by lip lesions which were initially thought to be impetigo but later resulted positive for HSV.  She has had some improvement with mupirocin and was initiated on valacyclovir.  Subjective: Patient is increasingly disoriented today.  She is trying to get out of bed.   Objective: Vitals:   02/08/24 0802 02/08/24 1155 02/08/24 1430 02/08/24 1605  BP: (!) 173/90 (!) 198/87  (!) 148/79  Pulse: 81 100  80  Resp:      Temp: 97.6 F (36.4 C)     TempSrc:       SpO2: 93% 90% 95% 95%  Weight:      Height:        Intake/Output Summary (Last 24 hours) at 02/08/2024 1658 Last data filed at 02/08/2024 0500 Gross per 24 hour  Intake 360 ml  Output --  Net 360 ml   Filed Weights   02/01/24 1212  Weight: 57.6 kg    Examination: General exam: Moaning, trying to get out of bed.  Uncomfortable Respiratory system: tachypnea, crackles lower lung bases. Cardiovascular system:  irregular rhythm Gastrointestinal system: Abdomen is nondistended, soft and nontender.  Neuro: disoriented, requires prompting for questions. Extremities: Symmetric, expected ROM Skin: lip scabbing improving significantly, no acute bleeding, no new lesions   psychiatry: calm.  Mood and affect appropriate.    Assessment & Plan:  Principal Problem:   Pneumonia of right lower lobe due to infectious organism Active Problems:   AKI (acute kidney injury) (HCC)   Essential hypertension   Vitamin B12 deficiency   Adjustment disorder with mixed anxiety and depressed mood   Acute hypoxic respiratory failure (HCC)   GERD (gastroesophageal reflux disease)   Mild intermittent asthma without complication   Hyperlipidemia   Hypoxia   Ecchymosis of left eye   Sepsis (HCC)   Paroxysmal atrial fibrillation (HCC)   Atrial fibrillation with RVR (HCC)   Hiatal hernia   Pneumonia   HSV-1 infection   Acute encephalopathy     Right middle lobe pneumonia - Was on azithromycin and ceftriaxone, was  changed to doxycycline in light of amiodarone.  Has now completed treatment.   - CTA small pleural effusions and pneumonia. - Continue flutter valve and incentive spirometry - Continue supplemental O2 and wean as tolerated  Acute hypoxic respiratory failure - Multifactorial:  right middle lobe pneumonia, also some evidence of vascular congestion/pulmonary edema on chest x-ray.   -- Echo does not reveal evidence of heart failure or diastolic dysfunction. - Patient has received multiple  rounds of Lasix with minimal improvement.  She is also now suffering from contraction alkalosis.  Received 500 cc D5W on 2/10 - CTA small pleural effusions and pneumonia. - Has been on high-dose Methylpred, begin weaning today, go down by 10 mg every 3 days - Continue to try and wean as possible, persistently on 6 L - ABG today confirms metabolic alkalosis, compensated - Currently on high flow nasal cannula, will continue to wean as tolerated.  Intermittent BiPAP as needed - Continuous pulse ox - Patient meant to be on anuity inhaler outpatient but is not taking. - Consulted pulmonology for additional recommendations.  Hyponatremia Contraction alkalosis - Patient has received diuresis for pulmonary edema, she appears clinically dry and labs are worsening. S/p D5W bolus  Chronic hiatal hernia - Quite large, seen on CXR.  Likely contributing to atelectasis of left lung, initially obscuring additional pathology.  Observe for now.  Would benefit from general surgery consultation outpatient  A-fib RVR - Intermittent - Likely exacerbated by hypoxia, and breathing treatments. - Doing well now on PO Amio and Eliquis (2.5mg  given weight and Cr) -- TSH low, T4 WNL - Keep tight control of electrolytes - Appreciate cardiology consult - CHA2DS2-VASc: 4 - Echo without RWMA, EF 55 to 60%.  No diastolic or valvular dysfunction noted.  Hypomagnesemia Hypophosphatemia - Replace as needed  Asthma exacerbation - weaning prednisone - Continue DuoNebs 4 times daily  Memory loss, intermittent delirium Acute metabolic encephalopathy - Likely superimposed on history of dementia.  Acute delirium multifactorial from hypoxia, steroids, hypercapnia and sepsis - On scheduled low dose seroquel, intermittent wrist restraints -- PRN Haldol - At this point patient would benefit from brain MRI, though initial head CT was negative.  After discussion with infectious disease she would prefer MRI with contrast to  rule out HSV encephalitis.  Given kidney function we will hold off on this MRI for now.  If kidney function continues to improve consider MRI tomorrow - Patient's boyfriend at bedside reports that she has had increasing difficulty with memory outpatient.  He recently took her to PCP for this evaluation - HIV negative, RPR nonreactive, B12 elevated, TSH low, T4 WNL, thiamine pending - Head CT negative for acute intracranial abnormalities - Continue with frequent reorientation, delirium precaution  AKI - Presumed prerenal in setting of poor p.o. intake, worsening contraction alkalosis today. - Baseline creatinine appears to be 0.57 - Status post IV fluids - Creatinine improved - Avoid nephrotoxic meds - Renally dose when needed  Ecchymosis of left eye and forehead - Present on admission secondary to fall at home - No intracranial abnormalities  Perioral blood encrusted lesions - Boyfriend reports this is secondary to fall, but have been getting worse since admission -- Pt does appear to be biting these areas and she has been on heparin, which is certainly exacerbating the oozing -- Initially had honeycrusting lesions, thought to be impetigo treated with mupirocin. Lesions have improved significantly since initiation of mupirocin (see chart photos). -- HSV now positive - cont valtrex. No mucosal involvement,  no lesions in other locations.  -- VSV neg, mycoplasma IgM pending - Gram stain with few gram+ rods, no significant growth ---HIV negative  Hypertension - Lisinopril and HCTZ held at time of admission for AKI. - Continue to monitor, titrate as needed    DVT prophylaxis: Heparin infusion   Code Status: Full Code Family Communication: none at bedside today. Discussed with partner on the phone. Disposition:  Status is: Inpatient Remains inpatient appropriate because: Workup ongoing, not medically cleared yet.  May eventually require discharge to SNF.  PT OT.   Consultants:   Cardiology  Treatment Team:  Consulting Physician: Antonieta Iba, MD  Procedures:  N/a  Antimicrobials:  Anti-infectives (From admission, onward)    Start     Dose/Rate Route Frequency Ordered Stop   02/08/24 1130  valACYclovir (VALTREX) tablet 1,000 mg        1,000 mg Oral 2 times daily 02/08/24 1102 02/13/24 0959   02/07/24 2200  doxycycline (VIBRA-TABS) tablet 100 mg  Status:  Discontinued        100 mg Oral Every 12 hours 02/07/24 1027 02/08/24 1101   02/06/24 1515  valACYclovir (VALTREX) tablet 1,000 mg  Status:  Discontinued        1,000 mg Oral Daily 02/06/24 1424 02/08/24 1102   02/03/24 1000  doxycycline (VIBRAMYCIN) 100 mg in sodium chloride 0.9 % 250 mL IVPB  Status:  Discontinued        100 mg 125 mL/hr over 120 Minutes Intravenous Every 12 hours 02/02/24 1328 02/07/24 1026   02/02/24 1100  azithromycin (ZITHROMAX) 500 mg in sodium chloride 0.9 % 250 mL IVPB  Status:  Discontinued        500 mg 250 mL/hr over 60 Minutes Intravenous Every 24 hours 02/01/24 1402 02/02/24 1328   02/02/24 1000  cefTRIAXone (ROCEPHIN) 2 g in sodium chloride 0.9 % 100 mL IVPB        2 g 200 mL/hr over 30 Minutes Intravenous Every 24 hours 02/01/24 1402 02/05/24 1832   02/01/24 1230  cefTRIAXone (ROCEPHIN) 2 g in sodium chloride 0.9 % 100 mL IVPB        2 g 200 mL/hr over 30 Minutes Intravenous  Once 02/01/24 1218 02/01/24 1309   02/01/24 1230  azithromycin (ZITHROMAX) 500 mg in sodium chloride 0.9 % 250 mL IVPB        500 mg 250 mL/hr over 60 Minutes Intravenous  Once 02/01/24 1218 02/01/24 1421       Data Reviewed: I have personally reviewed following labs and imaging studies CBC: Recent Labs  Lab 02/04/24 0555 02/05/24 0432 02/06/24 0424 02/07/24 0505 02/08/24 0250  WBC 6.5 6.1 6.7 7.2 6.3  NEUTROABS 5.8 5.1 5.7 5.8 5.4  HGB 11.1* 10.8* 10.7* 10.6* 11.4*  HCT 35.4* 33.8* 34.2* 34.3* 36.7  MCV 99.4 96.0 99.1 98.8 100.0  PLT 142* 159 194 217 219   Basic Metabolic  Panel: Recent Labs  Lab 02/04/24 0555 02/05/24 0432 02/06/24 0424 02/07/24 0505 02/08/24 0250  NA 144 141 146* 149* 149*  K 3.2* 3.0* 3.5 3.9 3.7  CL 101 101 100 105 103  CO2 32 31 34* 37* 40*  GLUCOSE 141* 135* 160* 107* 150*  BUN 47* 50* 51* 52* 54*  CREATININE 1.47* 1.41* 1.52* 1.40* 1.26*  CALCIUM 8.1* 8.3* 8.9 9.3 8.9  MG 2.3 2.4 2.2 2.2 2.1  PHOS 3.4 2.6 3.7 1.8* 1.9*   GFR: Estimated Creatinine Clearance: 33.3 mL/min (A) (by C-G formula based on  SCr of 1.26 mg/dL (H)). Liver Function Tests: Recent Labs  Lab 02/04/24 0555 02/05/24 0432 02/06/24 0424 02/07/24 0505 02/08/24 0250  AST 50* 46* 36 36 60*  ALT 23 23 21 25  41  ALKPHOS 28* 28* 29* 28* 31*  BILITOT 0.2 0.8 0.3 0.3 0.7  PROT 5.5* 5.6* 5.6* 5.5* 5.3*  ALBUMIN 2.3* 2.5* 2.5* 2.5* 2.6*   CBG: Recent Labs  Lab 02/04/24 2054 02/04/24 2334  GLUCAP 146* 142*    Recent Results (from the past 240 hours)  Resp panel by RT-PCR (RSV, Flu A&B, Covid) Anterior Nasal Swab     Status: None   Collection Time: 02/01/24 12:21 PM   Specimen: Anterior Nasal Swab  Result Value Ref Range Status   SARS Coronavirus 2 by RT PCR NEGATIVE NEGATIVE Final    Comment: (NOTE) SARS-CoV-2 target nucleic acids are NOT DETECTED.  The SARS-CoV-2 RNA is generally detectable in upper respiratory specimens during the acute phase of infection. The lowest concentration of SARS-CoV-2 viral copies this assay can detect is 138 copies/mL. A negative result does not preclude SARS-Cov-2 infection and should not be used as the sole basis for treatment or other patient management decisions. A negative result may occur with  improper specimen collection/handling, submission of specimen other than nasopharyngeal swab, presence of viral mutation(s) within the areas targeted by this assay, and inadequate number of viral copies(<138 copies/mL). A negative result must be combined with clinical observations, patient history, and  epidemiological information. The expected result is Negative.  Fact Sheet for Patients:  BloggerCourse.com  Fact Sheet for Healthcare Providers:  SeriousBroker.it  This test is no t yet approved or cleared by the [place] FDA and  has been authorized for detection and/or diagnosis of SARS-CoV-2 by FDA under an Emergency Use Authorization (EUA). This EUA will remain  in effect (meaning this test can be used) for the duration of the COVID-19 declaration under Section 564(b)(1) of the Act, 21 U.S.C.section 360bbb-3(b)(1), unless the authorization is terminated  or revoked sooner.       Influenza A by PCR NEGATIVE NEGATIVE Final   Influenza B by PCR NEGATIVE NEGATIVE Final    Comment: (NOTE) The Xpert Xpress SARS-CoV-2/FLU/RSV plus assay is intended as an aid in the diagnosis of influenza from Nasopharyngeal swab specimens and should not be used as a sole basis for treatment. Nasal washings and aspirates are unacceptable for Xpert Xpress SARS-CoV-2/FLU/RSV testing.  Fact Sheet for Patients: BloggerCourse.com  Fact Sheet for Healthcare Providers: SeriousBroker.it  This test is not yet approved or cleared by the [place] FDA and has been authorized for detection and/or diagnosis of SARS-CoV-2 by FDA under an Emergency Use Authorization (EUA). This EUA will remain in effect (meaning this test can be used) for the duration of the COVID-19 declaration under Section 564(b)(1) of the Act, 21 U.S.C. section 360bbb-3(b)(1), unless the authorization is terminated or revoked.     Resp Syncytial Virus by PCR NEGATIVE NEGATIVE Final    Comment: (NOTE) Fact Sheet for Patients: BloggerCourse.com  Fact Sheet for Healthcare Providers: SeriousBroker.it  This test is not yet approved or cleared by the [place] FDA and has been  authorized for detection and/or diagnosis of SARS-CoV-2 by FDA under an Emergency Use Authorization (EUA). This EUA will remain in effect (meaning this test can be used) for the duration of the COVID-19 declaration under Section 564(b)(1) of the Act, 21 U.S.C. section 360bbb-3(b)(1), unless the authorization is terminated or revoked.  Performed at [facility],  [address], [place], [place] [address]   Blood Culture (routine x 2)     Status: None   Collection Time: 02/01/24 12:21 PM   Specimen: BLOOD  Result Value Ref Range Status   Specimen Description BLOOD LEFT ANTECUBITAL  Final   Special Requests   Final    BOTTLES DRAWN AEROBIC AND ANAEROBIC Blood Culture results may not be optimal due to an inadequate volume of blood received in culture bottles   Culture   Final    NO GROWTH 5 DAYS Performed at [facility], [address] [place], [place] [address]    Report Status 02/06/2024 FINAL  Final  Blood Culture (routine x 2)     Status: None   Collection Time: 02/01/24 12:21 PM   Specimen: BLOOD  Result Value Ref Range Status   Specimen Description BLOOD BLOOD RIGHT FOREARM  Final   Special Requests   Final    BOTTLES DRAWN AEROBIC AND ANAEROBIC Blood Culture results may not be optimal due to an inadequate volume of blood received in culture bottles   Culture   Final    NO GROWTH 5 DAYS Performed at [facility], [address] [place], [place] [address]    Report Status 02/06/2024 FINAL  Final  Varicella-zoster by PCR     Status: None   Collection Time: 02/04/24  5:10 PM   Specimen: Lesion; Sterile Swab  Result Value Ref Range Status   Varicella-Zoster, PCR Negative Negative Final    Comment: (NOTE) No Varicella Zoster Virus DNA detected. This test was developed and its performance characteristics determined by [facility].  It has not been cleared or approved by the Food and Drug Administration.  The FDA has determined that such clearance  or approval is not necessary. Performed At: [facility] [address] [place], [place] [id number] Jolene Schimke MD OZ:[id number]   Aerobic Culture w Gram Stain (superficial specimen)     Status: None   Collection Time: 02/04/24  5:10 PM   Specimen: Wound  Result Value Ref Range Status   Specimen Description   Final    WOUND Performed at [facility] [address], [place], [place] [address]    Special Requests   Final    LIP Performed at [facility], [address] [place], [place] [address]    Gram Stain   Final    NO WBC SEEN RARE GRAM POSITIVE RODS Performed at [facility], [address], [place], [place] [address]    Culture RARE STAPHYLOCOCCUS EPIDERMIDIS  Final   Report Status 02/08/2024 FINAL  Final   Organism ID, Bacteria STAPHYLOCOCCUS EPIDERMIDIS  Final      Susceptibility   Staphylococcus epidermidis - MIC*    CIPROFLOXACIN <=0.5 SENSITIVE Sensitive     ERYTHROMYCIN >=8 RESISTANT Resistant     GENTAMICIN <=0.5 SENSITIVE Sensitive     OXACILLIN <=0.25 SENSITIVE Sensitive     TETRACYCLINE 2 SENSITIVE Sensitive     VANCOMYCIN 2 SENSITIVE Sensitive     TRIMETH/SULFA <=10 SENSITIVE Sensitive     CLINDAMYCIN <=0.25 SENSITIVE Sensitive     RIFAMPIN <=0.5 SENSITIVE Sensitive     Inducible Clindamycin NEGATIVE Sensitive     * RARE STAPHYLOCOCCUS EPIDERMIDIS  Respiratory (~20 pathogens) panel by PCR     Status: None   Collection Time: 02/06/24  4:15 PM   Specimen: Nasopharyngeal Swab; Respiratory  Result Value Ref Range Status   Adenovirus NOT DETECTED NOT DETECTED Final  Coronavirus 229E NOT DETECTED NOT DETECTED Final    Comment: (NOTE) The Coronavirus on the Respiratory Panel, DOES NOT test for the novel  Coronavirus (2019 nCoV)    Coronavirus HKU1 NOT DETECTED NOT DETECTED Final   Coronavirus NL63 NOT DETECTED NOT DETECTED Final   Coronavirus OC43 NOT DETECTED NOT DETECTED Final   Metapneumovirus NOT DETECTED NOT  DETECTED Final   Rhinovirus / Enterovirus NOT DETECTED NOT DETECTED Final   Influenza A NOT DETECTED NOT DETECTED Final   Influenza B NOT DETECTED NOT DETECTED Final   Parainfluenza Virus 1 NOT DETECTED NOT DETECTED Final   Parainfluenza Virus 2 NOT DETECTED NOT DETECTED Final   Parainfluenza Virus 3 NOT DETECTED NOT DETECTED Final   Parainfluenza Virus 4 NOT DETECTED NOT DETECTED Final   Respiratory Syncytial Virus NOT DETECTED NOT DETECTED Final   Bordetella pertussis NOT DETECTED NOT DETECTED Final   Bordetella Parapertussis NOT DETECTED NOT DETECTED Final   Chlamydophila pneumoniae NOT DETECTED NOT DETECTED Final   Mycoplasma pneumoniae NOT DETECTED NOT DETECTED Final    Comment: Performed at [facility], [address] [place], [place] [address]     Radiology Studies: DG Chest Port 1 View Result Date: 02/07/2024 CLINICAL DATA:  Hypoxia EXAM: PORTABLE CHEST 1 VIEW COMPARISON:  02/04/2024 FINDINGS: Single frontal view of the chest demonstrates a stable enlarged cardiac silhouette. Persistent veiling opacities are seen at the lung bases, consistent with airspace disease and effusions as noted on prior CT. No pneumothorax. Hiatal hernia again noted. No acute bony abnormalities. IMPRESSION: 1. Stable bibasilar airspace disease and small bilateral pleural effusions. Electronically Signed   By: Sharlet Salina M.D.   On: 02/07/2024 14:59     Scheduled Meds:  amiodarone  200 mg Oral BID   apixaban  5 mg Oral BID   atorvastatin  40 mg Oral QHS   cyanocobalamin  1,000 mcg Oral Daily   ezetimibe  10 mg Oral Daily   ipratropium-albuterol  3 mL Nebulization Q6H   lisinopril  20 mg Oral Daily   methylPREDNISolone (SOLU-MEDROL) injection  40 mg Intravenous Q1500   metoprolol succinate  25 mg Oral BID   mupirocin ointment   Nasal BID   QUEtiapine  12.5 mg Oral Daily   valACYclovir  1,000 mg Oral BID   Continuous Infusions:     LOS: 7 days   CRITICAL CARE Total critical care  time: 55 minutes Critical care was time spent personally by me on the following activities: development of treatment plan with patient and/or surrogate as well as nursing, discussions with consultants, evaluation of patient's response to treatment, examination of patient, obtaining history from patient or surrogate, ordering and performing treatments and interventions, ordering and review of laboratory studies, ordering and review of radiographic studies, pulse oximetry and re-evaluation of patient's condition.     Debarah Crape, DO [facility]  To contact the attending physician between 7A-7P please use Epic Chat. To contact the covering physician during after hours 7P-7A, please review Amion.   02/08/2024, 4:58 PM   *This document has been created with the assistance of dictation software. Please excuse typographical errors. *

## 2024-02-08 NOTE — Plan of Care (Signed)
  Problem: Safety: Goal: Non-violent Restraint(s) Outcome: Progressing   Problem: Education: Goal: Knowledge of General Education information will improve Description: Including pain rating scale, medication(s)/side effects and non-pharmacologic comfort measures Outcome: Progressing   Problem: Health Behavior/Discharge Planning: Goal: Ability to manage health-related needs will improve Outcome: Progressing   Problem: Clinical Measurements: Goal: Ability to maintain clinical measurements within normal limits will improve Outcome: Progressing Goal: Will remain free from infection Outcome: Progressing Goal: Diagnostic test results will improve Outcome: Progressing Goal: Respiratory complications will improve Outcome: Progressing Goal: Cardiovascular complication will be avoided Outcome: Progressing   Problem: Activity: Goal: Risk for activity intolerance will decrease Outcome: Progressing   Problem: Nutrition: Goal: Adequate nutrition will be maintained Outcome: Progressing   Problem: Coping: Goal: Level of anxiety will decrease Outcome: Progressing   Problem: Elimination: Goal: Will not experience complications related to bowel motility Outcome: Progressing Goal: Will not experience complications related to urinary retention Outcome: Progressing   Problem: Pain Managment: Goal: General experience of comfort will improve and/or be controlled Outcome: Progressing   Problem: Safety: Goal: Ability to remain free from injury will improve Outcome: Progressing   Problem: Skin Integrity: Goal: Risk for impaired skin integrity will decrease Outcome: Progressing   Problem: Activity: Goal: Ability to tolerate increased activity will improve Outcome: Progressing   Problem: Clinical Measurements: Goal: Ability to maintain a body temperature in the normal range will improve Outcome: Progressing   Problem: Respiratory: Goal: Ability to maintain adequate ventilation will  improve Outcome: Progressing Goal: Ability to maintain a clear airway will improve Outcome: Progressing

## 2024-02-08 NOTE — Progress Notes (Signed)
Date of Admission:  02/01/2024      ID: [id number] DUFFY is a 63 y.o. female Principal Problem:   Pneumonia of right lower lobe due to infectious organism Active Problems:   Essential hypertension   Vitamin B12 deficiency   Adjustment disorder with mixed anxiety and depressed mood   Acute hypoxic respiratory failure (HCC)   GERD (gastroesophageal reflux disease)   Mild intermittent asthma without complication   Hyperlipidemia   Hypoxia   AKI (acute kidney injury) (HCC)   Ecchymosis of left eye   Sepsis (HCC)   Paroxysmal atrial fibrillation (HCC)   Atrial fibrillation with RVR (HCC)   Hiatal hernia   Pneumonia   HSV-1 infection   Acute encephalopathy  MAGABY RUMBERGER is a 64 y.o. with a history of HTN, IDA  COPD,  , Presents with fall on Sunday night and was hallucinating, had hematoma left eye, abraded lips, has chronic cough wbnt to her PCP's office on 02/01/24 and pulse ox was low (66%)  and she was sent to ED Had a hematoma above left eye, bruising around left eye Vitals in the ED 100.7, RR20, pulse 113, BP 110/62 Labs revealed Na 132, BUN 71, Cr 1.19, WBC 8.8, HB 13.9, PLT 115 Lactic acid 1 Ct head - no acute findings Blood culture sent Started on ceftriaxone and azithromycin for possible pneumonia EKG- RBBB and then Afib Started IV amiodarone Given IV lasix without much improvement I am asked to see her for the lip lesions whether it could be RIME, mycoplasma related skin and mucous lesions  Subjective A little more responsive today  Medications:   amiodarone  200 mg Oral BID   apixaban  5 mg Oral BID   atorvastatin  40 mg Oral QHS   cyanocobalamin  1,000 mcg Oral Daily   ezetimibe  10 mg Oral Daily   ipratropium-albuterol  3 mL Nebulization Q6H   lisinopril  20 mg Oral Daily   methylPREDNISolone (SOLU-MEDROL) injection  40 mg Intravenous Q1500   metoprolol succinate  25 mg Oral BID   mupirocin ointment   Nasal BID   QUEtiapine  12.5 mg Oral Daily    valACYclovir  1,000 mg Oral BID    Objective: Vital signs in last 24 hours: Patient Vitals for the past 24 hrs:  BP Temp Temp src Pulse Resp SpO2  02/08/24 2028 -- -- -- -- -- 94 %  02/08/24 1932 (!) 157/76 97.7 F (36.5 C) -- 86 18 91 %  02/08/24 1605 (!) 148/79 -- -- 80 -- 95 %  02/08/24 1430 -- -- -- -- -- 95 %  02/08/24 1155 (!) 198/87 -- -- 100 -- 90 %  02/08/24 0802 (!) 173/90 97.6 F (36.4 C) -- 81 -- 93 %  02/08/24 0341 (!) 162/97 98 F (36.7 C) Axillary 68 20 96 %  02/07/24 2355 (!) 170/92 97.9 F (36.6 C) Axillary 69 20 96 %     PHYSICAL EXAM:  General: opened her eyes when I called her name and said she is fine , she moved all her extremities to command  Perioral lesions, scabs falling off- no more bleeding  Lungs: b/l air entry, crepts Heart: s1s2 systolic murmur Abdomen: Soft, non-tender,not distended. Bowel sounds normal. No masses Extremities: atraumatic, no cyanosis. No edema. No clubbing Skin: No rashes or lesions. Or bruising Lymph: Cervical, supraclavicular normal. Neurologic:  moves all extremities  Lab Results    Latest Ref Rng & Units 02/08/2024    2:50 AM  02/07/2024    5:05 AM 02/06/2024    4:24 AM  CBC  WBC 4.0 - 10.5 K/uL 6.3  7.2  6.7   Hemoglobin 12.0 - 15.0 g/dL 16.1  09.6  04.5   Hematocrit 36.0 - 46.0 % 36.7  34.3  34.2   Platelets 150 - 400 K/uL 219  217  194        Latest Ref Rng & Units 02/08/2024    2:50 AM 02/07/2024    5:05 AM 02/06/2024    4:24 AM  CMP  Glucose 70 - 99 mg/dL 409  811  914   BUN 8 - 23 mg/dL 54  52  51   Creatinine 0.44 - 1.00 mg/dL 7.82  9.56  2.13   Sodium 135 - 145 mmol/L 149  149  146   Potassium 3.5 - 5.1 mmol/L 3.7  3.9  3.5   Chloride 98 - 111 mmol/L 103  105  100   CO2 22 - 32 mmol/L 40  37  34   Calcium 8.9 - 10.3 mg/dL 8.9  9.3  8.9   Total Protein 6.5 - 8.1 g/dL 5.3  5.5  5.6   Total Bilirubin 0.0 - 1.2 mg/dL 0.7  0.3  0.3   Alkaline Phos 38 - 126 U/L 31  28  29    AST 15 - 41 U/L 60  36  36   ALT 0  - 44 U/L 41  25  21       Microbiology: BC- NG HSV DNA of perioral lesion positive for HSV 1 Studies/Results: DG Chest Port 1 View Result Date: 02/07/2024 CLINICAL DATA:  Hypoxia EXAM: PORTABLE CHEST 1 VIEW COMPARISON:  02/04/2024 FINDINGS: Single frontal view of the chest demonstrates a stable enlarged cardiac silhouette. Persistent veiling opacities are seen at the lung bases, consistent with airspace disease and effusions as noted on prior CT. No pneumothorax. Hiatal hernia again noted. No acute bony abnormalities. IMPRESSION: 1. Stable bibasilar airspace disease and small bilateral pleural effusions. Electronically Signed   By: Sharlet Salina M.D.   On: 02/07/2024 14:59      Assessment/Plan: 63 yr female presenting with fall, sob X 1 day duration and not feeling well for a few days   ?Acute hypoxic resp failure with hypercapnia- COPD exacerbation/ Community Acquired Pneumonia/Chf was ceftriaxone  ( completed 5 days) and Doxy- and now on Doxy- managed as per hosptialist PE NEg  urine legionella /mycoplasma Neg   Acute encephalopathy Secondary to Co2 retention, hypoxia and high dose steroids as per hospitalist  ? MRI of brain   Fall with ecchymosis left eye Hematoma   Afib with RVR on amiodarone- well controlled  Perioral lesions due to HSV 1  Started valtrex adjusted to crcl for 5-7 days Watch closely for sz, because of AKI   Thrombocytopenia resolved   AKI  improving  Discussed the management with Dr.Dezii  I was asked to see the patient for the perioral lesions and we have a diagnosis and she is on appropriate rx ID will sign off, call if needed

## 2024-02-08 NOTE — TOC Initial Note (Signed)
Transition of Care [facility]) - Initial/Assessment Note    Patient Details  Name: Michelle Barnett MRN: [id number] Date of Birth: Aug 11, 1961  Transition of [facility]) CM/SW Contact:    Margarito Liner, LCSW Phone Number: 02/08/2024, 4:13 PM  Clinical Narrative: Patient only oriented to self. Significant other at bedside. CSW introduced role and explained that therapy recommendations would be discussed. Mr. Michelle Barnett is agreeable to SNF placement. CSW asked about family but he said she has no family to assist. Patient does not have children. No further concerns. CSW will continue to follow patient and Mr. Michelle Barnett for support and facilitate discharge to SNF once medically stable.                 Expected Discharge Plan: Skilled Nursing Facility Barriers to Discharge: Continued Medical Work up   Patient Goals and CMS Choice            Expected Discharge Plan and Services     Post Acute Care Choice: Skilled Nursing Facility Living arrangements for the past 2 months: Single Family Home                                      Prior Living Arrangements/Services Living arrangements for the past 2 months: Single Family Home   Patient language and need for interpreter reviewed:: Yes Do you feel safe going back to the place where you live?: Yes      Need for Family Participation in Patient Care: Yes (Comment) Care giver support system in place?: Yes (comment)   Criminal Activity/Legal Involvement Pertinent to Current Situation/Hospitalization: No - Comment as needed  Activities of Daily Living   ADL Screening (condition at time of admission) Independently performs ADLs?: No Does the patient have a NEW difficulty with bathing/dressing/toileting/self-feeding that is expected to last >3 days?: Yes (Initiates electronic notice to provider for possible OT consult) Does the patient have a NEW difficulty with getting in/out of bed, walking, or climbing stairs that is expected to last >3  days?: Yes (Initiates electronic notice to provider for possible PT consult) Does the patient have a NEW difficulty with communication that is expected to last >3 days?: No Is the patient deaf or have difficulty hearing?: No Does the patient have difficulty seeing, even when wearing glasses/contacts?: No Does the patient have difficulty concentrating, remembering, or making decisions?: No  Permission Sought/Granted      Share Information with NAME: Rodman Comp  Permission granted to share info w AGENCY: SNF's  Permission granted to share info w Relationship: Significant other  Permission granted to share info w Contact Information: [phone]  Emotional Assessment Appearance:: Appears stated age Attitude/Demeanor/Rapport: Unable to Assess Affect (typically observed): Unable to Assess Orientation: : Oriented to Self Alcohol / Substance Use: Not Applicable Psych Involvement: No (comment)  Admission diagnosis:  Pneumonia [J18.9] Right middle lobe pneumonia [J18.9] Pneumonia of right lower lobe due to infectious organism [J18.9] Sepsis, due to unspecified organism, unspecified whether acute organ dysfunction present (HCC) [A41.9] Acute hypoxic respiratory failure (HCC) [J96.01] Patient Active Problem List   Diagnosis Date Noted   Acute encephalopathy 02/07/2024   HSV-1 infection 02/06/2024   Pneumonia 02/04/2024   Hiatal hernia 02/03/2024   Sepsis (HCC) 02/02/2024   Paroxysmal atrial fibrillation (HCC) 02/02/2024   Atrial fibrillation with RVR (HCC) 02/02/2024   Head injury 02/01/2024   Hypoxia 02/01/2024   Pneumonia of right lower lobe due  to infectious organism 02/01/2024   AKI (acute kidney injury) (HCC) 02/01/2024   Ecchymosis of left eye 02/01/2024   Rash and nonspecific skin eruption 09/09/2023   Bilateral lower extremity edema 09/09/2023   Hyperlipidemia 05/19/2022   Acute cough 05/19/2022   Murmur 05/21/2021   GERD (gastroesophageal reflux disease) 11/08/2017    Mild intermittent asthma without complication 11/08/2017   Demand ischemia (HCC) 09/20/2016   Bicuspid aortic valve 09/20/2016   Acute hypoxic respiratory failure (HCC) 09/19/2016   Preventative health care 06/15/2016   Obesity 01/27/2016   Anemia, iron deficiency 07/30/2014   Essential hypertension 07/30/2014   COPD, severity to be determined (HCC) 07/30/2014   Vitamin B12 deficiency 07/30/2014   Adjustment disorder with mixed anxiety and depressed mood 07/30/2014   PCP:  Doreene Nest, NP Pharmacy:   [facility] [address]), [place] - [address] Jerilynn Mages [place]) [place] [address] Phone: [phone] Fax: [phone]     Social Drivers of Health (SDOH) Social History: SDOH Screenings   Food Insecurity: No Food Insecurity (02/03/2024)  Housing: Low Risk  (02/03/2024)  Transportation Needs: No Transportation Needs (02/03/2024)  Utilities: Not At Risk (02/03/2024)  Depression (PHQ2-9): Low Risk  (09/09/2023)  Social Connections: Moderately Integrated (02/03/2024)  Tobacco Use: Low Risk  (02/03/2024)   SDOH Interventions:     Readmission Risk Interventions     No data to display

## 2024-02-08 NOTE — Progress Notes (Signed)
Rounding Note    Patient Name: Michelle Barnett Date of Encounter: 02/08/2024  [facility] Cardiologist: Julien Nordmann, MD   Subjective   Patient is awake on exam and moans during my exam. Her husband is at the bedside. She has not had recurrence of afib since transition to oral amiodarone. She remains on 6 L supplemental O2.   Inpatient Medications    Scheduled Meds:  amiodarone  200 mg Oral BID   apixaban  2.5 mg Oral BID   atorvastatin  40 mg Oral QHS   cyanocobalamin  1,000 mcg Oral Daily   doxycycline  100 mg Oral Q12H   ezetimibe  10 mg Oral Daily   methylPREDNISolone (SOLU-MEDROL) injection  80 mg Intravenous Q1500   metoprolol succinate  25 mg Oral BID   mupirocin ointment   Nasal BID   QUEtiapine  12.5 mg Oral Daily   valACYclovir  1,000 mg Oral Daily   Continuous Infusions:   PRN Meds: acetaminophen, albuterol, HYDROcodone-acetaminophen, morphine injection, mouth rinse, senna-docusate   Vital Signs    Vitals:   02/07/24 1950 02/07/24 2355 02/08/24 0341 02/08/24 0802  BP: (!) 156/92 (!) 170/92 (!) 162/97 (!) 173/90  Pulse: 71 69 68 81  Resp: 20 20 20    Temp: (!) 97.1 F (36.2 C) 97.9 F (36.6 C) 98 F (36.7 C) 97.6 F (36.4 C)  TempSrc: Axillary Axillary Axillary   SpO2: 96% 96% 96% 93%  Weight:      Height:        Intake/Output Summary (Last 24 hours) at 02/08/2024 0835 Last data filed at 02/08/2024 0500 Gross per 24 hour  Intake 600 ml  Output 450 ml  Net 150 ml      02/01/2024   12:12 PM 02/01/2024   10:55 AM 09/24/2023    8:25 AM  Last 3 Weights  Weight (lbs) 127 lb -- 122 lb  Weight (kg) 57.607 kg -- 55.339 kg      Telemetry    Sinus rhythm - Personally Reviewed  Physical Exam   GEN: No acute distress.   HENT: Large scabbed lesions of the mouth, ecchymosis over left eye. No JVD Cardiac: RRR, 2/6 systolic murmur, rubs, or gallops.  Respiratory: Clear to auscultation bilaterally. GI: Soft, nontender, non-distended  MS:  No edema; No deformity. Neuro:  Nonfocal  Psych: Normal affect   Labs    High Sensitivity Troponin:  No results for input(s): "TROPONINIHS" in the last 720 hours.   Chemistry Recent Labs  Lab 02/06/24 0424 02/07/24 0505 02/08/24 0250  NA 146* 149* 149*  K 3.5 3.9 3.7  CL 100 105 103  CO2 34* 37* 40*  GLUCOSE 160* 107* 150*  BUN 51* 52* 54*  CREATININE 1.52* 1.40* 1.26*  CALCIUM 8.9 9.3 8.9  MG 2.2 2.2 2.1  PROT 5.6* 5.5* 5.3*  ALBUMIN 2.5* 2.5* 2.6*  AST 36 36 60*  ALT 21 25 41  ALKPHOS 29* 28* 31*  BILITOT 0.3 0.3 0.7  GFRNONAA 38* 42* 48*  ANIONGAP 12 7 6     Lipids  Recent Labs  Lab 02/06/24 0424  CHOL 137  TRIG 115  HDL 39*  LDLCALC 75  CHOLHDL 3.5    Hematology Recent Labs  Lab 02/06/24 0424 02/07/24 0505 02/08/24 0250  WBC 6.7 7.2 6.3  RBC 3.45* 3.47* 3.67*  HGB 10.7* 10.6* 11.4*  HCT 34.2* 34.3* 36.7  MCV 99.1 98.8 100.0  MCH 31.0 30.5 31.1  MCHC 31.3 30.9 31.1  RDW  15.1 14.8 14.4  PLT 194 217 219   Thyroid  Recent Labs  Lab 02/02/24 1812 02/03/24 1034  TSH 0.228*  --   FREET4  --  0.88    BNPNo results for input(s): "BNP", "PROBNP" in the last 168 hours.  DDimer No results for input(s): "DDIMER" in the last 168 hours.   Radiology    DG Chest Port 1 View Result Date: 02/07/2024 CLINICAL DATA:  Hypoxia EXAM: PORTABLE CHEST 1 VIEW COMPARISON:  02/04/2024 FINDINGS: Single frontal view of the chest demonstrates a stable enlarged cardiac silhouette. Persistent veiling opacities are seen at the lung bases, consistent with airspace disease and effusions as noted on prior CT. No pneumothorax. Hiatal hernia again noted. No acute bony abnormalities. IMPRESSION: 1. Stable bibasilar airspace disease and small bilateral pleural effusions. Electronically Signed   By: Sharlet Salina M.D.   On: 02/07/2024 14:59    Cardiac Studies   05/27/2022 TTE 1. Left ventricular ejection fraction, by estimation, is 60 to 65%. The  left ventricle has normal  function. The left ventricle has no regional  wall motion abnormalities. There is mild left ventricular hypertrophy.  Left ventricular diastolic parameters  are consistent with Grade II diastolic dysfunction (pseudonormalization).   2. Right ventricular systolic function is normal. The right ventricular  size is normal.   3. Left atrial size was mildly dilated.   4. The mitral valve is grossly normal. Mild mitral valve regurgitation.   5. The aortic valve is calcified. Aortic valve regurgitation is not  visualized. Mild aortic valve stenosis.   6. The inferior vena cava is normal in size with greater than 50%  respiratory variability, suggesting right atrial pressure of 3 mmHg.    09/13/2016 LHC Ost LM to LM lesion, 20 %stenosed. There is no aortic valve stenosis.   1. Mild ostial left main stenosis due to slight kinking of the vessel. Otherwise no evidence of obstructive coronary artery disease. 2. Normal left ventricular end-diastolic pressure with minimal gradient across the aortic valve. Normal ejection fraction by echo.  Patient Profile     Michelle Barnett is a 63 y.o. female with a hx of asthma/COPD, bicuspid aortic valve, non-occlusive CAD, and hypertension who is being seen for the continued evaluation of atrial fibrillation with RVR.   Assessment & Plan    Right middle lobe pneumonia Hypoxia Asthma - Presented 2/4 with cough, dyspnea, and hypoxia - CXR with evidence of pneumonia - Remains on 6 L supplemental O2 - Management per IM  New onset atrial fibrillation with RVR - Patient's chart reviewed, no known history of atrial fibrillation - Sinus rhythm on arrival, converted to atrial fibrillation with RVR 2/5. Started on amiodarone bolus and infusion with successful conversion to sinus rhythm 2/5. Had 2 episodes of recurrent afib overnight 2/7.  - High risk for recurrence of atrial fibrillation in the setting of PNA - CHA2DS2VASc 4 - Echo 2/5 showed LVEF 55-60% without  RWMA - K 3.7 and mag 2.1 - Telemetry shows sinus rhythm - Continue Eliquis 5 mg BID (increased today from 2.5 mg with improved kidney function) - Continue amiodarone 200 mg BID to complete load. Would not recommend long term use given young age. Recommend use until PNA is resolved then discontinuing - Continue Toprol-XL 25 mg BID  Hypokalemia - K 3.7, continue to monitor and replete as needed for K > 4  Acute kidney injury - Cr 1.52>1.4 - Management per IM  Ecchymosis of left eye - Present on admission  secondary to fall at home  Hyperlipidemia - LDL 75, goal < 70 - Continue atorvastatin 40 mg daily and Zetia 10 mg daily (started yesterday 2/9) - Will need repeat FLP and ALT in 8 weeks  Essential hypertension - BP stable - Continue Toprol-XL as above - PTA lisinopril-HCT remains on hold due to AKI  Aortic stenosis Mitral regurgitation - Both moderate on echo 1/5 - Follow with year echo as outpatient  Nonobstructive CAD - LHC 08/2016 showed nonobstructive CAD, details above - Denies anginal symptoms - Continue Toprol XL, atorvastatin and Zetia as above - No ASA, on Eliquis as above  For questions or updates, please contact [facility] Please consult [email or web address] for contact info under        Signed, Orion Crook, PA-C  02/08/2024, 8:35 AM

## 2024-02-08 NOTE — NC FL2 (Signed)
[place] MEDICAID FL2 LEVEL OF CARE FORM     IDENTIFICATION  Patient Name: Michelle Barnett Birthdate: 03-08-1961 Sex: female Admission Date (Current Location): 02/01/2024  [place] and [place] Number:  Chiropodist and Address:  [facility], [address] [place], [place] [address]      Provider Number: [id number]  Attending Physician Name and Address:  Debarah Crape, DO  Relative Name and Phone Number:       Current Level of Care: Hospital Recommended Level of Care: Skilled Nursing Facility Prior Approval Number:    Date Approved/Denied:   PASRR Number: Need to get physical home address for PASARR.  Discharge Plan: SNF    Current Diagnoses: Patient Active Problem List   Diagnosis Date Noted   Acute encephalopathy 02/07/2024   HSV-1 infection 02/06/2024   Pneumonia 02/04/2024   Hiatal hernia 02/03/2024   Sepsis (HCC) 02/02/2024   Paroxysmal atrial fibrillation (HCC) 02/02/2024   Atrial fibrillation with RVR (HCC) 02/02/2024   Head injury 02/01/2024   Hypoxia 02/01/2024   Pneumonia of right lower lobe due to infectious organism 02/01/2024   AKI (acute kidney injury) (HCC) 02/01/2024   Ecchymosis of left eye 02/01/2024   Rash and nonspecific skin eruption 09/09/2023   Bilateral lower extremity edema 09/09/2023   Hyperlipidemia 05/19/2022   Acute cough 05/19/2022   Murmur 05/21/2021   GERD (gastroesophageal reflux disease) 11/08/2017   Mild intermittent asthma without complication 11/08/2017   Demand ischemia (HCC) 09/20/2016   Bicuspid aortic valve 09/20/2016   Acute hypoxic respiratory failure (HCC) 09/19/2016   Preventative health care 06/15/2016   Obesity 01/27/2016   Anemia, iron deficiency 07/30/2014   Essential hypertension 07/30/2014   COPD, severity to be determined (HCC) 07/30/2014   Vitamin B12 deficiency 07/30/2014   Adjustment disorder with mixed anxiety and depressed mood 07/30/2014    Orientation  RESPIRATION BLADDER Height & Weight     Self  O2 (HFNC 4 L. Weaning as tolerated. Bipap QHS 20/8 at 60%.) Incontinent Weight: 127 lb (57.6 kg) Height:  4\' 9"  (144.8 cm)  BEHAVIORAL SYMPTOMS/MOOD NEUROLOGICAL BOWEL NUTRITION STATUS  Other (Comment) (Some anxiety, agitation. Not combative.)  (None) Incontinent Diet (DYS 2)  AMBULATORY STATUS COMMUNICATION OF NEEDS Skin   Extensive Assist Verbally Skin abrasions, Bruising, Other (Comment)                       Personal Care Assistance Level of Assistance  Bathing, Feeding, Dressing Bathing Assistance: Maximum assistance Feeding assistance: Maximum assistance Dressing Assistance: Maximum assistance     Functional Limitations Info  Sight, Hearing, Speech Sight Info: Adequate Hearing Info: Adequate Speech Info: Impaired (Incomprehensible)    SPECIAL CARE FACTORS FREQUENCY  PT (By licensed PT), OT (By licensed OT)     PT Frequency: 5 x week OT Frequency: 5 x week            Contractures Contractures Info: Not present    Additional Factors Info  Code Status, Allergies Code Status Info: Full code Allergies Info: Amlodipine, Aspirin, Dairy Aid (Tilactase), Benadryl (Diphenhydramine), Penicillins           Current Medications (02/08/2024):  This is the current hospital active medication list Current Facility-Administered Medications  Medication Dose Route Frequency Provider Last Rate Last Admin   acetaminophen (TYLENOL) tablet 650 mg  650 mg Oral Q6H PRN Dezii, Alexandra, DO   650 mg at 02/07/24 0902   amiodarone (PACERONE) tablet 200 mg  200 mg Oral  BID Dezii, Gordy Councilman, DO   200 mg at 02/08/24 0913   apixaban (ELIQUIS) tablet 5 mg  5 mg Oral BID Orion Crook, PA-C   5 mg at 02/08/24 1033   atorvastatin (LIPITOR) tablet 40 mg  40 mg Oral QHS Cox, Amy N, DO   40 mg at 02/07/24 2251   cyanocobalamin (VITAMIN B12) tablet 1,000 mcg  1,000 mcg Oral Daily Cox, Amy N, DO   1,000 mcg at 02/07/24 5409   ezetimibe (ZETIA)  tablet 10 mg  10 mg Oral Daily Quintella Reichert, MD   10 mg at 02/08/24 8119   hydrALAZINE (APRESOLINE) injection 10 mg  10 mg Intravenous Q6H PRN Dezii, Gordy Councilman, DO   10 mg at 02/08/24 1033   HYDROcodone-acetaminophen (NORCO/VICODIN) 5-325 MG per tablet 1 tablet  1 tablet Oral Q6H PRN Dezii, Gordy Councilman, DO   1 tablet at 02/08/24 0920   ipratropium-albuterol (DUONEB) 0.5-2.5 (3) MG/3ML nebulizer solution 3 mL  3 mL Nebulization Q6H Dezii, Alexandra, DO   3 mL at 02/08/24 1426   labetalol (NORMODYNE) injection 10 mg  10 mg Intravenous Q2H PRN Dezii, Alexandra, DO   10 mg at 02/08/24 1217   lisinopril (ZESTRIL) tablet 20 mg  20 mg Oral Daily Iona Hansen, Mariah L, PA-C       methylPREDNISolone sodium succinate (SOLU-MEDROL) 40 mg/mL injection 40 mg  40 mg Intravenous Q1500 Dezii, Alexandra, DO       metoprolol succinate (TOPROL-XL) 24 hr tablet 25 mg  25 mg Oral BID Antonieta Iba, MD   25 mg at 02/08/24 0913   morphine (PF) 2 MG/ML injection 2 mg  2 mg Intravenous Q4H PRN Dezii, Gordy Councilman, DO   2 mg at 02/08/24 1211   mupirocin ointment (BACTROBAN) 2 %   Nasal BID Debarah Crape, DO   1 Application at 02/08/24 1203   Oral care mouth rinse  15 mL Mouth Rinse PRN Jawo, Modou L, NP       QUEtiapine (SEROQUEL) tablet 12.5 mg  12.5 mg Oral Daily Dezii, Alexandra, DO   12.5 mg at 02/08/24 1478   senna-docusate (Senokot-S) tablet 1 tablet  1 tablet Oral QHS PRN Cox, Amy N, DO       valACYclovir (VALTREX) tablet 1,000 mg  1,000 mg Oral BID Aleda Grana, [facility]         Discharge Medications: Please see discharge summary for a list of discharge medications.  Relevant Imaging Results:  Relevant Lab Results:   Additional Information SS#: [id number]  Margarito Liner, LCSW

## 2024-02-09 ENCOUNTER — Inpatient Hospital Stay: Payer: No Typology Code available for payment source

## 2024-02-09 ENCOUNTER — Ambulatory Visit: Payer: No Typology Code available for payment source

## 2024-02-09 DIAGNOSIS — G9340 Encephalopathy, unspecified: Secondary | ICD-10-CM | POA: Diagnosis not present

## 2024-02-09 DIAGNOSIS — G9341 Metabolic encephalopathy: Secondary | ICD-10-CM | POA: Diagnosis not present

## 2024-02-09 DIAGNOSIS — R4182 Altered mental status, unspecified: Secondary | ICD-10-CM | POA: Diagnosis not present

## 2024-02-09 DIAGNOSIS — R569 Unspecified convulsions: Secondary | ICD-10-CM | POA: Diagnosis not present

## 2024-02-09 DIAGNOSIS — B009 Herpesviral infection, unspecified: Secondary | ICD-10-CM | POA: Diagnosis not present

## 2024-02-09 DIAGNOSIS — I959 Hypotension, unspecified: Secondary | ICD-10-CM

## 2024-02-09 DIAGNOSIS — J9601 Acute respiratory failure with hypoxia: Secondary | ICD-10-CM | POA: Diagnosis not present

## 2024-02-09 DIAGNOSIS — J189 Pneumonia, unspecified organism: Secondary | ICD-10-CM | POA: Diagnosis not present

## 2024-02-09 LAB — CBC WITH DIFFERENTIAL/PLATELET
Abs Immature Granulocytes: 0.11 10*3/uL — ABNORMAL HIGH (ref 0.00–0.07)
Basophils Absolute: 0 10*3/uL (ref 0.0–0.1)
Basophils Relative: 0 %
Eosinophils Absolute: 0 10*3/uL (ref 0.0–0.5)
Eosinophils Relative: 0 %
HCT: 38.1 % (ref 36.0–46.0)
Hemoglobin: 11.9 g/dL — ABNORMAL LOW (ref 12.0–15.0)
Immature Granulocytes: 2 %
Lymphocytes Relative: 7 %
Lymphs Abs: 0.4 10*3/uL — ABNORMAL LOW (ref 0.7–4.0)
MCH: 30.8 pg (ref 26.0–34.0)
MCHC: 31.2 g/dL (ref 30.0–36.0)
MCV: 98.7 fL (ref 80.0–100.0)
Monocytes Absolute: 0.6 10*3/uL (ref 0.1–1.0)
Monocytes Relative: 11 %
Neutro Abs: 4.6 10*3/uL (ref 1.7–7.7)
Neutrophils Relative %: 80 %
Platelets: 231 10*3/uL (ref 150–400)
RBC: 3.86 MIL/uL — ABNORMAL LOW (ref 3.87–5.11)
RDW: 14.3 % (ref 11.5–15.5)
WBC: 5.8 10*3/uL (ref 4.0–10.5)
nRBC: 0 % (ref 0.0–0.2)

## 2024-02-09 LAB — COMPREHENSIVE METABOLIC PANEL
ALT: 64 U/L — ABNORMAL HIGH (ref 0–44)
AST: 77 U/L — ABNORMAL HIGH (ref 15–41)
Albumin: 2.7 g/dL — ABNORMAL LOW (ref 3.5–5.0)
Alkaline Phosphatase: 31 U/L — ABNORMAL LOW (ref 38–126)
Anion gap: 12 (ref 5–15)
BUN: 47 mg/dL — ABNORMAL HIGH (ref 8–23)
CO2: 40 mmol/L — ABNORMAL HIGH (ref 22–32)
Calcium: 9.3 mg/dL (ref 8.9–10.3)
Chloride: 102 mmol/L (ref 98–111)
Creatinine, Ser: 1.04 mg/dL — ABNORMAL HIGH (ref 0.44–1.00)
GFR, Estimated: >60 mL/min (ref >60)
Glucose, Bld: 134 mg/dL — ABNORMAL HIGH (ref 70–99)
Potassium: 3.5 mmol/L (ref 3.5–5.1)
Sodium: 154 mmol/L — ABNORMAL HIGH (ref 135–145)
Total Bilirubin: 0.7 mg/dL (ref 0.0–1.2)
Total Protein: 5.6 g/dL — ABNORMAL LOW (ref 6.5–8.1)

## 2024-02-09 LAB — MRSA NEXT GEN BY PCR, NASAL: MRSA by PCR Next Gen: NOT DETECTED

## 2024-02-09 LAB — BLOOD GAS, ARTERIAL
Acid-Base Excess: 20 mmol/L — ABNORMAL HIGH (ref 0.0–2.0)
Bicarbonate: 46 mmol/L — ABNORMAL HIGH (ref 20.0–28.0)
FIO2: 40 %
MECHVT: 400 mL
Mechanical Rate: 15
O2 Saturation: 98 %
PEEP: 5 cmH2O
Patient temperature: 37
pCO2 arterial: 55 mm[Hg] — ABNORMAL HIGH (ref 32–48)
pH, Arterial: 7.53 — ABNORMAL HIGH (ref 7.35–7.45)
pO2, Arterial: 77 mm[Hg] — ABNORMAL LOW (ref 83–108)

## 2024-02-09 LAB — GLUCOSE, CAPILLARY
Glucose-Capillary: 154 mg/dL — ABNORMAL HIGH (ref 70–99)
Glucose-Capillary: 153 mg/dL — ABNORMAL HIGH (ref 70–99)

## 2024-02-09 LAB — PHOSPHORUS: Phosphorus: 1.8 mg/dL — ABNORMAL LOW (ref 2.5–4.6)

## 2024-02-09 LAB — MAGNESIUM: Magnesium: 2.1 mg/dL (ref 1.7–2.4)

## 2024-02-09 MED ORDER — FENTANYL CITRATE (PF) 100 MCG/2ML IJ SOLN: 100.0000 ug | Freq: Once | INTRAMUSCULAR | Status: DC

## 2024-02-09 MED ORDER — LISINOPRIL 5 MG PO TABS: 20.0000 mg | ORAL_TABLET | Freq: Every day | ORAL | Status: DC

## 2024-02-09 MED ORDER — LEVETIRACETAM IN NACL 500 MG/100ML IV SOLN
500.0000 mg | Freq: Two times a day (BID) | INTRAVENOUS | Status: DC
  Administered 2024-02-09 – 2024-02-15 (×12): 500 mg via INTRAVENOUS
  Filled 2024-02-09 (×12): qty 100

## 2024-02-09 MED ORDER — QUETIAPINE FUMARATE 25 MG PO TABS: 12.5000 mg | ORAL_TABLET | Freq: Every day | ORAL | Status: DC

## 2024-02-09 MED ORDER — MIDAZOLAM HCL 2 MG/2ML IJ SOLN: 1.0000 mg | INTRAMUSCULAR | Status: DC | PRN

## 2024-02-09 MED ORDER — ORAL CARE MOUTH RINSE
15.0000 mL | OROMUCOSAL | Status: DC
  Administered 2024-02-09 – 2024-02-12 (×31): 15 mL via OROMUCOSAL

## 2024-02-09 MED ORDER — NOREPINEPHRINE 4 MG/250ML-% IV SOLN: 0.0000 ug/min | INTRAVENOUS | Status: DC

## 2024-02-09 MED ORDER — AMIODARONE HCL 200 MG PO TABS: 200.0000 mg | ORAL_TABLET | Freq: Two times a day (BID) | ORAL | Status: DC

## 2024-02-09 MED ORDER — VITAMIN B-12 1000 MCG PO TABS: 1000.0000 ug | ORAL_TABLET | Freq: Every day | ORAL | Status: DC

## 2024-02-09 MED ORDER — FENTANYL CITRATE PF 50 MCG/ML IJ SOSY: 50.0000 ug | PREFILLED_SYRINGE | INTRAMUSCULAR | Status: DC | PRN

## 2024-02-09 MED ORDER — ROCURONIUM BROMIDE 10 MG/ML (PF) SYRINGE: 50.0000 mg | PREFILLED_SYRINGE | Freq: Once | INTRAVENOUS | Status: AC

## 2024-02-09 MED ORDER — PROPOFOL 1000 MG/100ML IV EMUL
INTRAVENOUS | Status: AC
  Administered 2024-02-09: 10 ug/kg/min via INTRAVENOUS
  Filled 2024-02-09: qty 100

## 2024-02-09 MED ORDER — MIDAZOLAM HCL 2 MG/2ML IJ SOLN: 2.0000 mg | Freq: Once | INTRAMUSCULAR | Status: DC

## 2024-02-09 MED ORDER — MIDAZOLAM HCL 2 MG/2ML IJ SOLN
INTRAMUSCULAR | Status: AC
  Administered 2024-02-09: 2 mg via INTRAVENOUS
  Filled 2024-02-09: qty 2

## 2024-02-09 MED ORDER — MIDAZOLAM HCL 2 MG/2ML IJ SOLN: 2.0000 mg | Freq: Once | INTRAMUSCULAR | Status: AC

## 2024-02-09 MED ORDER — SODIUM CHLORIDE 0.9 % IV SOLN: INTRAVENOUS | Status: DC

## 2024-02-09 MED ORDER — DEXTROSE 5 % IV SOLN
10.0000 mg/kg | Freq: Two times a day (BID) | INTRAVENOUS | Status: DC
  Administered 2024-02-09 – 2024-02-12 (×6): 575 mg via INTRAVENOUS
  Filled 2024-02-09 (×7): qty 11.5

## 2024-02-09 MED ORDER — DEXTROSE 5 % IV SOLN: INTRAVENOUS | Status: DC

## 2024-02-09 MED ORDER — FENTANYL CITRATE PF 50 MCG/ML IJ SOSY
50.0000 ug | PREFILLED_SYRINGE | Freq: Once | INTRAMUSCULAR | Status: AC
  Administered 2024-02-09: 50 ug via INTRAVENOUS

## 2024-02-09 MED ORDER — IPRATROPIUM-ALBUTEROL 0.5-2.5 (3) MG/3ML IN SOLN: 3.0000 mL | Freq: Four times a day (QID) | RESPIRATORY_TRACT | Status: DC | PRN

## 2024-02-09 MED ORDER — ORAL CARE MOUTH RINSE: 15.0000 mL | OROMUCOSAL | Status: DC | PRN

## 2024-02-09 MED ORDER — ROCURONIUM BROMIDE 10 MG/ML (PF) SYRINGE
PREFILLED_SYRINGE | INTRAVENOUS | Status: AC
  Administered 2024-02-09: 50 mg via INTRAVENOUS
  Filled 2024-02-09: qty 10

## 2024-02-09 MED ORDER — ATORVASTATIN CALCIUM 20 MG PO TABS: 40.0000 mg | ORAL_TABLET | Freq: Every day | ORAL | Status: DC

## 2024-02-09 MED ORDER — POLYETHYLENE GLYCOL 3350 17 G PO PACK
17.0000 g | PACK | Freq: Every day | ORAL | Status: DC
  Administered 2024-02-13 – 2024-02-14 (×2): 17 g
  Filled 2024-02-09 (×2): qty 1

## 2024-02-09 MED ORDER — EZETIMIBE 10 MG PO TABS: 10.0000 mg | ORAL_TABLET | Freq: Every day | ORAL | Status: DC

## 2024-02-09 MED ORDER — PROPOFOL 1000 MG/100ML IV EMUL
5.0000 ug/kg/min | INTRAVENOUS | Status: DC
  Administered 2024-02-09: 40 ug/kg/min via INTRAVENOUS
  Administered 2024-02-09: 50 ug/kg/min via INTRAVENOUS
  Administered 2024-02-10: 35 ug/kg/min via INTRAVENOUS
  Administered 2024-02-10: 40 ug/kg/min via INTRAVENOUS
  Administered 2024-02-10: 45 ug/kg/min via INTRAVENOUS
  Administered 2024-02-10 – 2024-02-11 (×2): 40 ug/kg/min via INTRAVENOUS
  Administered 2024-02-11: 30 ug/kg/min via INTRAVENOUS
  Administered 2024-02-11: 40 ug/kg/min via INTRAVENOUS
  Administered 2024-02-12: 35 ug/kg/min via INTRAVENOUS
  Filled 2024-02-09 (×10): qty 100

## 2024-02-09 MED ORDER — GADOBUTROL 1 MMOL/ML IV SOLN
6.0000 mL | Freq: Once | INTRAVENOUS | Status: AC | PRN
  Administered 2024-02-09: 6 mL via INTRAVENOUS

## 2024-02-09 MED ORDER — DOCUSATE SODIUM 50 MG/5ML PO LIQD: 100.0000 mg | Freq: Two times a day (BID) | ORAL | Status: DC

## 2024-02-09 MED ORDER — LEVETIRACETAM IN NACL 1000 MG/100ML IV SOLN
1000.0000 mg | Freq: Once | INTRAVENOUS | Status: AC
  Administered 2024-02-09: 1000 mg via INTRAVENOUS
  Filled 2024-02-09: qty 100

## 2024-02-09 MED ORDER — DEXTROSE 5 % IV SOLN
500.0000 mg | Freq: Two times a day (BID) | INTRAVENOUS | Status: DC
  Administered 2024-02-09: 500 mg via INTRAVENOUS
  Filled 2024-02-09 (×2): qty 10

## 2024-02-09 MED ORDER — PANTOPRAZOLE SODIUM 40 MG IV SOLR
40.0000 mg | Freq: Every day | INTRAVENOUS | Status: DC
  Administered 2024-02-09 – 2024-02-16 (×8): 40 mg via INTRAVENOUS
  Filled 2024-02-09 (×8): qty 10

## 2024-02-09 MED ORDER — SODIUM CHLORIDE 0.9 % IV SOLN: 250.0000 mL | INTRAVENOUS | Status: AC

## 2024-02-09 MED ORDER — NOREPINEPHRINE 4 MG/250ML-% IV SOLN
INTRAVENOUS | Status: AC
  Administered 2024-02-09: 2 ug/min via INTRAVENOUS
  Filled 2024-02-09: qty 250

## 2024-02-09 MED ORDER — FENTANYL CITRATE PF 50 MCG/ML IJ SOSY
PREFILLED_SYRINGE | INTRAMUSCULAR | Status: AC
  Filled 2024-02-09: qty 2

## 2024-02-09 MED ORDER — SENNOSIDES-DOCUSATE SODIUM 8.6-50 MG PO TABS: 1.0000 | ORAL_TABLET | Freq: Every evening | ORAL | Status: DC | PRN

## 2024-02-09 MED ORDER — CHLORHEXIDINE GLUCONATE CLOTH 2 % EX PADS
6.0000 | MEDICATED_PAD | Freq: Every day | CUTANEOUS | Status: DC
  Administered 2024-02-09 – 2024-02-25 (×16): 6 via TOPICAL

## 2024-02-09 MED ORDER — FENTANYL CITRATE PF 50 MCG/ML IJ SOSY
50.0000 ug | PREFILLED_SYRINGE | INTRAMUSCULAR | Status: DC | PRN
  Administered 2024-02-11: 100 ug via INTRAVENOUS
  Filled 2024-02-09: qty 2

## 2024-02-09 MED ORDER — POTASSIUM PHOSPHATES 15 MMOLE/5ML IV SOLN
15.0000 mmol | Freq: Once | INTRAVENOUS | Status: AC
  Administered 2024-02-09: 15 mmol via INTRAVENOUS
  Filled 2024-02-09: qty 5

## 2024-02-09 MED ORDER — ACETAMINOPHEN 325 MG PO TABS: 650.0000 mg | ORAL_TABLET | Freq: Four times a day (QID) | ORAL | Status: DC | PRN

## 2024-02-09 MED ORDER — MIDAZOLAM HCL 2 MG/2ML IJ SOLN
INTRAMUSCULAR | Status: AC
  Filled 2024-02-09: qty 2

## 2024-02-09 MED ORDER — IPRATROPIUM-ALBUTEROL 0.5-2.5 (3) MG/3ML IN SOLN
3.0000 mL | Freq: Three times a day (TID) | RESPIRATORY_TRACT | Status: DC
  Administered 2024-02-09 – 2024-02-12 (×11): 3 mL via RESPIRATORY_TRACT
  Filled 2024-02-09 (×11): qty 3

## 2024-02-09 MED ORDER — NOREPINEPHRINE 4 MG/250ML-% IV SOLN
2.0000 ug/min | INTRAVENOUS | Status: DC
  Administered 2024-02-10: 2 ug/min via INTRAVENOUS
  Administered 2024-02-12: 3 ug/min via INTRAVENOUS
  Filled 2024-02-09 (×3): qty 250

## 2024-02-09 NOTE — Consult Note (Signed)
PHARMACY CONSULT NOTE - ELECTROLYTES  Pharmacy Consult for Electrolyte Monitoring and Replacement   Recent Labs: Potassium (mmol/L)  Date Value  02/09/2024 3.5  12/17/2014 3.6   Magnesium (mg/dL)  Date Value  78/29/5621 2.1  11/01/2013 2.6 (H)   Calcium (mg/dL)  Date Value  30/86/5784 9.3   Calcium, Total (mg/dL)  Date Value  69/62/9528 9.1   Albumin (g/dL)  Date Value  41/32/4401 2.7 (L)  04/27/2013 3.1 (L)   Phosphorus (mg/dL)  Date Value  02/72/5366 1.8 (L)   Sodium (mmol/L)  Date Value  02/09/2024 154 (H)  09/28/2016 143  12/17/2014 140   Height: 4\' 9"  (144.8 cm) Weight: 57.6 kg (127 lb) IBW/kg (Calculated) : 38.6 Estimated Creatinine Clearance: 40.4 mL/min (A) (by C-G formula based on SCr of 1.04 mg/dL (H)).  Assessment  Michelle Barnett is a 63 y.o. female presenting with respiratory failure. PMH significant for hypertension, hyperlipidemia, iron deficiency anemia . Pharmacy has been consulted to monitor and replace electrolytes.  Diet: NPO, intubated. OG tube MIVF: NS @ 125 mL/hr (hydration while on IV acyclovir) Pertinent medications: N/A  Goal of Therapy: Electrolytes within normal limits  Plan:  Na 154, started on NS at 125 cc/hr for hydration while on IV acyclovir. Patient is alkalotic K 3.5, Phos 1.8, give potasium phosphate 15 mmol IV x 1 dose Follow-up electrolytes with AM labs tomorrow  Thank you for allowing pharmacy to be a part of this patient's care.  Tressie Ellis 02/09/2024 1:58 PM

## 2024-02-09 NOTE — Consult Note (Signed)
Pharmacy Antimicrobial Note  Michelle Barnett is a 63 y.o. female admitted on 02/01/2024 with meningitis.  Pharmacy has been consulted for acyclovir dosing.  Plan:  Acyclovir 500 mg (10 mg/kg) IV q12h --NS at 125 cc/hr while on acyclovir  Height: 4\' 9"  (144.8 cm) Weight: 57.6 kg (127 lb) IBW/kg (Calculated) : 38.6  Temp (24hrs), Avg:97.7 F (36.5 C), Min:97.2 F (36.2 C), Max:98 F (36.7 C)  Recent Labs  Lab 02/05/24 0432 02/06/24 0424 02/07/24 0505 02/08/24 0250 02/09/24 0438  WBC 6.1 6.7 7.2 6.3 5.8  CREATININE 1.41* 1.52* 1.40* 1.26* 1.04*    Estimated Creatinine Clearance: 40.4 mL/min (A) (by C-G formula based on SCr of 1.04 mg/dL (H)).    Allergies  Allergen Reactions   Amlodipine Rash   Aspirin Anaphylaxis   Dairy Aid [Tilactase] Swelling and Other (See Comments)    Any dairy products   Benadryl [Diphenhydramine] Palpitations   Penicillins Other (See Comments)    Reaction: Unknown   Antimicrobials this admission: Azithromycin 2/4 >> 2/5 Ceftriaxone 2/4 >> 2/8 Doxycycline 2/6 >> 2/11 Valtrex 2/9 >> 2/11 Acyclovir 2/12 >>   Dose adjustments this admission: N/A  Microbiology results: 2/4 BCx: NG 2/7 VZV PCR: (-) 2/7 Wcx: Staphylococcus epidermidis 2/9 RVP: (-)  Thank you for allowing pharmacy to be a part of this patient's care.  Tressie Ellis 02/09/2024 1:33 PM

## 2024-02-09 NOTE — Plan of Care (Signed)
Problem: Education: Goal: Knowledge of General Education information will improve Description: Including pain rating scale, medication(s)/side effects and non-pharmacologic comfort measures Outcome: Progressing   Problem: Health Behavior/Discharge Planning: Goal: Ability to manage health-related needs will improve Outcome: Progressing   Problem: Nutrition: Goal: Adequate nutrition will be maintained Outcome: Progressing   Problem: Elimination: Goal: Will not experience complications related to bowel motility Outcome: Progressing Goal: Will not experience complications related to urinary retention Outcome: Progressing

## 2024-02-09 NOTE — Progress Notes (Signed)
Responded to over head page for code blue-pt actively with medical team  Introduced myself to pt friend in hallway and offered compassionate presence and listening ear-pt was to be transferred to ICU.  Friend of pt at bedside-shared he was sad she was not getting better-but he was thankful for time to visit-encouraged him that his visits were good for his friend-and she was in good hands to be well cared for here.

## 2024-02-09 NOTE — Progress Notes (Signed)
OT Cancellation Note  Patient Details Name: Michelle Barnett MRN: [id number] DOB: [birth year]   Cancelled Treatment:    Reason Eval/Treat Not Completed: Medical issues which prohibited therapy. Rapid response changed to code blue was called on pt this AM d/t seizure like activity requiring transfer to ICU and intubation. OT orders to be signed off/discontinued and will need new orders when pt is medically stable/appropriate.  Constance Goltz 02/09/2024, 12:13 PM

## 2024-02-09 NOTE — Progress Notes (Signed)
Progress Note   Patient: Michelle Barnett:[id number] DOB: 05/10/1961 DOA: 02/01/2024     8 DOS: the patient was seen and examined on 02/09/2024    Subjective:  Altered unable to provide subjective information Patient had acute seizure episode and rapid response was called on patient this morning Initially thought to have lost her pulse and so rapid response was changed to CODE BLUE.  Within few seconds pulse was found and CODE BLUE was cancelled. Patient did not undergo any chest compressions as there was no true loss of pulse. Mental status was still altered with impaired breathing and rapid response/code team made decision  to transfer the patient to the ICU for further management    Brief hospital course: Michelle Barnett is 63 y.o. female with hypertension, hyperlipidemia, iron deficiency anemia, who presents to the ED with acute hypoxia.  Patient was previously in her PCP office where she was noted to be 66% on room air, this improved on 3 L O2 but could not get higher than 88%.  EMS was called and brought to the patient to the hospital.  In the ED she was found to be septic with a temperature of 100.7, respirations 20, tachycardic to 113 and hypoxic.  Labs were mostly unrevealing.  Patient underwent CT head, CT cervical spine, CT maxillofacial which revealed soft tissue swelling of the periorbital soft tissues of the left and along the left frontal scalp.  She received DuoNebs, azithromycin, ceftriaxone.  Hospital stay has been prolonged by ongoing hypoxia, inability to wean oxygen, and persistent delirium.  Patient has now completed her course of antibiotics.  We have initiated steroid taper, but she remains on 6 L nasal cannula.   Patient's stay was also complicated by A-fib RVR necessitating cardiology consult, amiodarone drip and heparin drip which have since been transitioned to Eliquis and amiodarone p.o.  A-fib RVR likely provoked by hypoxia and breathing treatments. Patient stay is  also been complicated by lip lesions which were initially thought to be impetigo but later resulted positive for HSV.  She has had some improvement with mupirocin and was initiated on valacyclovir.  Patient had rapid response called on her after she had tonic-clonic seizures on 02/09/2024.  Initially thought to have lost her pulse and CODE BLUE was called however pulse was detected and code canceled and patient did not undergo any chest compressions.  I discussed the case with PCCM attending Dr. Larinda Buttery who agreed to take over management as patient was transferred to the ICU.   Assessment and Plan:  Principal Problem:   Pneumonia of right lower lobe due to infectious organism Active Problems:   AKI (acute kidney injury) (HCC)   Essential hypertension   Vitamin B12 deficiency   Adjustment disorder with mixed anxiety and depressed mood   Acute hypoxic respiratory failure (HCC)   GERD (gastroesophageal reflux disease)   Mild intermittent asthma without complication   Hyperlipidemia   Hypoxia   Ecchymosis of left eye   Sepsis (HCC)   Paroxysmal atrial fibrillation (HCC)   Atrial fibrillation with RVR (HCC)   Hiatal hernia   Pneumonia   HSV-1 infection   Acute encephalopathy      Memory loss, intermittent delirium Acute metabolic encephalopathy in the setting of acute seizures - Likely superimposed on history of dementia.   Initially mental status was thought to be due to acute delirium multifactorial from hypoxia, steroids, hypercapnia and sepsis However patient developed acute seizures this morning Neurologist consulted.  Appreciate input -  On scheduled low dose seroquel, intermittent wrist restraints -- PRN Haldol - Patient awaiting MRI of the brain -After discussion with infectious disease she would prefer MRI with contrast to rule out HSV encephalitis.   - HIV negative, RPR nonreactive, B12 elevated, TSH low, T4 WNL, thiamine pending - Head CT negative for acute intracranial  abnormalities - Continue with frequent reorientation, delirium precaution   Acute hypoxic respiratory failure - Multifactorial:  right middle lobe pneumonia, also some evidence of vascular congestion/pulmonary edema on chest x-ray.   -- Echo does not reveal evidence of heart failure or diastolic dysfunction. - Patient has received multiple rounds of Lasix with minimal improvement.  She is also now suffering from contraction alkalosis.  Received 500 cc D5W on 2/10 - CTA small pleural effusions and pneumonia. - Continue current oxygen requirement and wean as tolerated - Pulmonologist consulted      Right middle lobe pneumonia - Was on azithromycin and ceftriaxone, was changed to doxycycline in light of amiodarone.  Has now completed treatment.   - CTA small pleural effusions and pneumonia. - Continue flutter valve and incentive spirometry - Continue supplemental O2 and wean as tolerated  Hyponatremia Contraction alkalosis - Patient has received diuresis for pulmonary edema, she appears clinically dry and labs are worsening. S/p D5W bolus   Chronic hiatal hernia - Continue to observe for now.  Would benefit from general surgery consultation outpatient   A-fib RVR - Intermittent - Likely exacerbated by hypoxia, and breathing treatments. - Doing well now on PO Amio and Eliquis (2.5mg  given weight and Cr) -- TSH low, T4 WNL - Keep tight control of electrolytes - Cardiology following - CHA2DS2-VASc: 4 - Echo without RWMA, EF 55 to 60%.  No diastolic or valvular dysfunction noted.   Hypomagnesemia Hypophosphatemia - Replace as needed   Asthma exacerbation - weaning prednisone - Continue DuoNebs 4 times daily     AKI - Presumed prerenal in setting of poor p.o. intake, worsening contraction alkalosis today. - Baseline creatinine appears to be 0.57 Status post IV fluid Continue to monitor renal function closely   Ecchymosis of left eye and forehead - Present on admission  secondary to fall at home - No intracranial abnormalities   Perioral blood encrusted lesions - Boyfriend reports this is secondary to fall, but have been getting worse since admission -- Pt does appear to be biting these areas and she has been on heparin, which is certainly exacerbating the oozing -- Initially had honeycrusting lesions, thought to be impetigo treated with mupirocin. Lesions have improved significantly since initiation of mupirocin (see chart photos). -- HSV now positive - cont valtrex. No mucosal involvement, no lesions in other locations.  -- VSV neg, mycoplasma IgM pending - Gram stain with few gram+ rods, no significant growth ---HIV negative -Infectious disease on board   Hypertension - Lisinopril and HCTZ held at time of admission for AKI. - Continue to monitor, titrate as needed       DVT prophylaxis: Heparin infusion   Code Status: Full Code Family Communication: none at bedside today. Discussed with partner on the phone. Disposition:  Status is: Inpatient Remains inpatient appropriate because: Workup ongoing, not medically cleared yet.  May eventually require discharge to SNF.  PT OT.    Consultants:  Cardiology  Treatment Team:  Consulting Physician: Antonieta Iba, MD   Physical Exam: General exam: Altered unable to provide subjective information Respiratory system: tachypnea, crackles lower lung bases. Cardiovascular system:  irregular rhythm Gastrointestinal system: Abdomen  is nondistended, soft and nontender.  Neuro: disoriented, requires prompting for questions. Extremities: Symmetric, expected ROM Skin: Crusting of lesions noted to the legs psychiatry: calm.  Mood and affect appropriate.     Vitals:   02/09/24 0911 02/09/24 0928 02/09/24 1000 02/09/24 1100  BP: (!) 156/92 (!) 156/92 (!) 172/91 134/69  Pulse: 77  80 64  Resp: 18  16 15   Temp: 97.9 F (36.6 C)   (!) 97.2 F (36.2 C)  TempSrc: Oral   Axillary  SpO2: 95%  100% 99%   Weight:      Height:        Data Reviewed: CT scan of the brain requested, I have reviewed previous CT scan of the brain that did not show acute pathology    Latest Ref Rng & Units 02/09/2024    4:38 AM 02/08/2024    2:50 AM 02/07/2024    5:05 AM  CBC  WBC 4.0 - 10.5 K/uL 5.8  6.3  7.2   Hemoglobin 12.0 - 15.0 g/dL 16.1  09.6  04.5   Hematocrit 36.0 - 46.0 % 38.1  36.7  34.3   Platelets 150 - 400 K/uL 231  219  217        Latest Ref Rng & Units 02/09/2024    4:38 AM 02/08/2024    2:50 AM 02/07/2024    5:05 AM  BMP  Glucose 70 - 99 mg/dL 409  811  914   BUN 8 - 23 mg/dL 47  54  52   Creatinine 0.44 - 1.00 mg/dL 7.82  9.56  2.13   Sodium 135 - 145 mmol/L 154  149  149   Potassium 3.5 - 5.1 mmol/L 3.5  3.7  3.9   Chloride 98 - 111 mmol/L 102  103  105   CO2 22 - 32 mmol/L 40  40  37   Calcium 8.9 - 10.3 mg/dL 9.3  8.9  9.3      Family Communication: None present at bedside this morning  Disposition: Status is: Inpatient  Time spent: I spent a total of 45 minutes of critical care time during rapid response with patient today.  This involved defervescing with rapid response team, intensivist as well as medical decision making and transferring patient to the ICU for further management.  Author: Loyce Dys, MD 02/09/2024 1:09 PM  For on call review [email or web address].

## 2024-02-09 NOTE — Procedures (Signed)
Intubation Procedure Note  JAIDAN STACHNIK  [id number]  April 04, 1961  Date:02/09/24  Time:10:27 AM   Provider Performing:Amreen Raczkowski D Elvina Sidle    Procedure: Intubation (31500)  Indication(s) Respiratory Failure  Consent Unable to obtain consent due to emergent nature of procedure.   Anesthesia Versed, Fentanyl, and Rocuronium   Time Out Verified patient identification, verified procedure, site/side was marked, verified correct patient position, special equipment/implants available, medications/allergies/relevant history reviewed, required imaging and test results available.   Sterile Technique Usual hand hygeine, masks, and gloves were used   Procedure Description Patient positioned in bed supine.  Sedation given as noted above.  Patient was intubated with endotracheal tube using Glidescope.  View was Grade 1 full glottis .  Number of attempts was 1.  Colorimetric CO2 detector was consistent with tracheal placement.   Complications/Tolerance None; patient tolerated the procedure well. Chest X-ray is ordered to verify placement.   EBL Minimal   Specimen(s) None   Size 7.5 ETT Tube secured at 22 cm at lip    Harlon Ditty, AGACNP-BC  Pulmonary & Critical Care Prefer epic messenger for cross cover needs If after hours, please call E-link

## 2024-02-09 NOTE — Progress Notes (Signed)
Pharmacy - Brief Note (acyclovir dosing)  After discussing with ID physician, increase acyclovir to 575mg  IV q12h    See previous pharmacy consult note for details  Juliette Alcide, PharmD, BCPS, BCIDP Work Cell: [phone] 02/09/2024 4:47 PM

## 2024-02-09 NOTE — Consult Note (Signed)
NEUROLOGY CONSULT NOTE   Date of service: February 09, 2024 Patient Name: Michelle Barnett MRN:  [id number] DOB:  1961-04-22 Chief Complaint: first time GTC Requesting Provider: Janann Colonel, MD  History of Present Illness   This is a 63 yo woman with hx HTN, HL, iron deficiency anemia admitted for hypoxia in the setting of CAP. Neurology is consulted for first-time seizure today. 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 She has been encephalopathic without clear etiology for several days. This AM 02/09/24 she had a first time GTC seizure for which a RRT was called. A code blue was called shortly after for agonal breathing but she never had cardiac arrest. She required intubation for airway protection and was transferred to the ICU.   During this hospitalization patient was noted to be in a fib with RVR and she was started on eliquis. It is currently being held in anticipation of LP (last dose 02/08/24 PM).  Prior to admission she had a fall Sunday night resulting in a hematoma around her L eye and facial bruising.    ROS   Unable to ascertain due to unresponsiveness  Past History   Past Medical History:  Diagnosis Date   Asthma    Bicuspid aortic valve 09/20/2016   a. echo 09/19/16: EF 55-60%, GR1DD, possible bicuspid aortic valve without evidence of AS   Bronchitis    Coronary artery disease, non-occlusive    a. cath 09/21/16: ostLM to LM 20%, no evidence of aortic stenosis   Demand ischemia (HCC) 09/20/2016   Heart murmur    Hiatal hernia    History of blood transfusion    Hypertension    Persistent cough for 3 weeks or longer 05/19/2022   Ulcer     Past Surgical History:  Procedure Laterality Date   abdominal tumor     ABLATION     CARDIAC CATHETERIZATION N/A 09/21/2016   Procedure: Left Heart Cath and Coronary Angiography;  Surgeon: Iran Ouch, MD;  Location:  [facility] INVASIVE CV LAB;  Service: Cardiovascular;  Laterality: N/A;   COLONOSCOPY N/A 08/14/2016   Procedure: COLONOSCOPY;  Surgeon: Sherrilyn Rist, MD;  Location: [facility] ENDOSCOPY;  Service: Endoscopy;  Laterality: N/A;   ESOPHAGOGASTRODUODENOSCOPY N/A 08/13/2016   Procedure: ESOPHAGOGASTRODUODENOSCOPY (EGD);  Surgeon: Sherrilyn Rist, MD;  Location: [facility] ENDOSCOPY;  Service: Gastroenterology;  Laterality: N/A;   TOOTH EXTRACTION      Family History: Family History  Problem Relation Age of Onset   Stroke Mother    Hypertension Mother    Hypertension Father    Heart disease Father    Hypertension Brother    Breast cancer Maternal Aunt     Social History  reports that she has never smoked. She has never used smokeless tobacco. She reports that she does not drink alcohol and does not use drugs.  Allergies  Allergen Reactions   Amlodipine Rash   Aspirin Anaphylaxis   Dairy Aid [Tilactase] Swelling and Other (See Comments)    Any dairy products   Benadryl [Diphenhydramine] Palpitations   Penicillins Other (See Comments)    Reaction: Unknown    Medications   Current Facility-Administered Medications:    0.9 %  sodium chloride infusion, 250 mL, Intravenous, Continuous, Madueme, Elvira C, RPH   acetaminophen (TYLENOL) tablet 650 mg, 650 mg, Oral, Q6H PRN, Dezii, Alexandra, DO, 650 mg at 02/07/24 0902   amiodarone (PACERONE)  tablet 200 mg, 200 mg, Oral, BID, Dezii, Alexandra, DO, 200 mg at 02/08/24 2216   atorvastatin (LIPITOR) tablet 40 mg, 40 mg, Oral, QHS, Cox, Amy N, DO, 40 mg at 02/08/24 2216   Chlorhexidine Gluconate Cloth 2 % PADS 6 each, 6 each, Topical, Daily, Assaker, [place] Bali, MD   cyanocobalamin (VITAMIN B12) tablet 1,000 mcg, 1,000 mcg, Oral, Daily, Cox, Amy N, DO, 1,000 mcg at 02/07/24 0903   docusate (COLACE) 50 MG/5ML liquid 100 mg, 100 mg, Per Tube, BID, Harlon Ditty D, NP   ezetimibe (ZETIA) tablet 10 mg, 10 mg, Oral, Daily, Turner, Traci R, MD, 10 mg at 02/08/24  0917   fentaNYL (SUBLIMAZE) injection 50 mcg, 50 mcg, Intravenous, Q15 min PRN, Harlon Ditty D, NP   fentaNYL (SUBLIMAZE) injection 50-200 mcg, 50-200 mcg, Intravenous, Q30 min PRN, Harlon Ditty D, NP   hydrALAZINE (APRESOLINE) injection 10 mg, 10 mg, Intravenous, Q6H PRN, Dezii, Alexandra, DO, 10 mg at 02/08/24 1033   HYDROcodone-acetaminophen (NORCO/VICODIN) 5-325 MG per tablet 1 tablet, 1 tablet, Oral, Q6H PRN, Dezii, Alexandra, DO, 1 tablet at 02/09/24 0656   ipratropium-albuterol (DUONEB) 0.5-2.5 (3) MG/3ML nebulizer solution 3 mL, 3 mL, Nebulization, TID, Dezii, Alexandra, DO, 3 mL at 02/09/24 0744   ipratropium-albuterol (DUONEB) 0.5-2.5 (3) MG/3ML nebulizer solution 3 mL, 3 mL, Nebulization, Q6H PRN, Dezii, Alexandra, DO   labetalol (NORMODYNE) injection 10 mg, 10 mg, Intravenous, Q2H PRN, Dezii, Alexandra, DO, 10 mg at 02/08/24 1217   levETIRAcetam (KEPPRA) IVPB 500 mg/100 mL premix, 500 mg, Intravenous, Q12H, Harlon Ditty D, NP   lisinopril (ZESTRIL) tablet 20 mg, 20 mg, Oral, Daily, Ames Dura L, PA-C, 20 mg at 02/08/24 1754   methylPREDNISolone sodium succinate (SOLU-MEDROL) 40 mg/mL injection 40 mg, 40 mg, Intravenous, Q1500, Dezii, Alexandra, DO, 40 mg at 02/08/24 1755   metoprolol succinate (TOPROL-XL) 24 hr tablet 25 mg, 25 mg, Oral, BID, Gollan, Tollie Pizza, MD, 25 mg at 02/08/24 2216   midazolam (VERSED) 2 MG/2ML injection, , , ,    midazolam (VERSED) injection 1-2 mg, 1-2 mg, Intravenous, Q1H PRN, Judithe Modest, NP   midazolam (VERSED) injection 2 mg, 2 mg, Intravenous, Once, Harlon Ditty D, NP   morphine (PF) 2 MG/ML injection 2 mg, 2 mg, Intravenous, Q4H PRN, Dezii, Alexandra, DO, 2 mg at 02/08/24 1211   mupirocin ointment (BACTROBAN) 2 %, , Nasal, BID, Dezii, Alexandra, DO, Given at 02/08/24 2217   norepinephrine (LEVOPHED) 4mg  in (0.016 mg/mL) premix infusion, 2-10 mcg/min, Intravenous, Titrated, Madueme, Elvira C, RPH, Last Rate: 7.5 mL/hr at 02/09/24  1022, 2 mcg/min at 02/09/24 1022   Oral care mouth rinse, 15 mL, Mouth Rinse, PRN, Jawo, Modou L, NP   pantoprazole (PROTONIX) injection 40 mg, 40 mg, Intravenous, Daily, Harlon Ditty D, NP, 40 mg at 02/09/24 1216   polyethylene glycol (MIRALAX / GLYCOLAX) packet 17 g, 17 g, Per Tube, Daily, Harlon Ditty D, NP   propofol (DIPRIVAN) 1000 MG/100ML infusion, 5-80 mcg/kg/min, Intravenous, Titrated, Harlon Ditty D, NP, Last Rate: 17.28 mL/hr at 02/09/24 1105, 50 mcg/kg/min at 02/09/24 1105   QUEtiapine (SEROQUEL) tablet 12.5 mg, 12.5 mg, Oral, Daily, Dezii, Alexandra, DO, 12.5 mg at 02/08/24 9604   senna-docusate (Senokot-S) tablet 1 tablet, 1 tablet, Oral, QHS PRN, Cox, Amy N, DO   valACYclovir (VALTREX) tablet 1,000 mg, 1,000 mg, Oral, BID, Aleda Grana, RPH, 1,000 mg at 02/08/24 2216  Vitals   Vitals:   02/09/24 0911 02/09/24 0928 02/09/24 1000 02/09/24 1100  BP: Marland Kitchen)  156/92 (!) 156/92 (!) 172/91 134/69  Pulse: 77  80 64  Resp: 18  16 15   Temp: 97.9 F (36.6 C)   (!) 97.2 F (36.2 C)  TempSrc: Oral   Axillary  SpO2: 95%  100% 99%  Weight:      Height:        Body mass index is 27.48 kg/m.  Physical Exam   Gen: patient lying in bed, does not follow commands CV: extremities appear well-perfused Resp: ventilated  Neurologic Examination   Propofol paused x15 min prior to exam  MS: does not open eyes to sternal rub, has non-purposeful movements of all extremities in response to noxious stimuli, does not follow commands Speech: intubated, no attempts to speaL CN: PUPILS 3mm ERRL, (+) corneals, oculocephalics, cough, gag Motor & sensory: non-purposeful movements of all extremities in response to noxious stimuli  Labs/Imaging/Neurodiagnostic studies   CBC:  Recent Labs  Lab Feb 15, 2024 0250 02/09/24 0438  WBC 6.3 5.8  NEUTROABS 5.4 4.6  HGB 11.4* 11.9*  HCT 36.7 38.1  MCV 100.0 98.7  PLT 219 231   Basic Metabolic Panel:  Lab Results  Component Value  Date   NA 154 (H) 02/09/2024   K 3.5 02/09/2024   CO2 40 (H) 02/09/2024   GLUCOSE 134 (H) 02/09/2024   BUN 47 (H) 02/09/2024   CREATININE 1.04 (H) 02/09/2024   CALCIUM 9.3 02/09/2024   GFRNONAA >60 02/09/2024   GFRAA 77 09/28/2016   Lipid Panel:  Lab Results  Component Value Date   LDLCALC 75 02/06/2024   HgbA1c:  Lab Results  Component Value Date   HGBA1C 5.8 05/19/2022   Urine Drug Screen: No results found for: "LABOPIA", "COCAINSCRNUR", "LABBENZ", "AMPHETMU", "THCU", "LABBARB"  Alcohol Level No results found for: "ETH" INR  Lab Results  Component Value Date   INR 1.0 02/01/2024   APTT  Lab Results  Component Value Date   APTT 28 02/01/2024   AED levels: No results found for: "PHENYTOIN", "ZONISAMIDE", "LAMOTRIGINE", "LEVETIRACETA"  CT Head without contrast(Personally reviewed): Unremarkable on personal review  Neurodiagnostics rEEG:  pending  ASSESSMENT   This is a 63 yo woman with hx HTN, HL, iron deficiency anemia admitted for hypoxia in the setting of CAP. She has been encephalopathic despite tx of her PNA for several days and today had a first-time GTC. Sx concerning for possible encephalitis, most likely 2/2 HSV given her current severe oral outbreak.   RECOMMENDATIONS   - Hold eliquis for LP - LP in 48 hrs with cell count in 2 tubes, glucose, protein, gram stain and culture, meningo-encephalitis PCR panel - In the interim d/c valtrex and start empiric CNS dosing of acyclovir - MRI brain with and without contrast - EEG r/o ongoing nonconvulsive seizures - Keppra 500mg  q 12 hrs  This patient is critically ill and at significant risk of neurological worsening, death and care requires constant monitoring of vital signs, hemodynamics,respiratory and cardiac monitoring, neurological assessment, discussion with family, other specialists and medical decision making of high complexity. I spent 95 minutes of neurocritical care time  in the care of  this patient.  This was time spent independent of any time provided by nurse practitioner or PA.  Bing Neighbors, MD [facility] [phone]  If 7pm- 7am, please page neurology on call as listed in AMION.

## 2024-02-09 NOTE — Progress Notes (Signed)
Transported to MRI and back with no events.

## 2024-02-09 NOTE — Significant Event (Signed)
Responded to rapid response which was changed to Code Blue. On arrival had been placed on monitor. Spontaneous respirations. Reported pt had a seizure. ED MD, hospitalist present. Pt moved to ICU 13. Supplemental oxygen had been applied. On arrival pt still with pulse and respirations.

## 2024-02-09 NOTE — Progress Notes (Signed)
PT Cancellation Note  Patient Details Name: Michelle Barnett MRN: [id number] DOB: May 17, 1961   Cancelled Treatment:    Reason Eval/Treat Not Completed: Medical issues which prohibited therapy (Code blue, xfer to ICU, intubation. Will sign off. Please place new consult when appropriate.)   [name] C 02/09/2024, 1:43 PM

## 2024-02-09 NOTE — Progress Notes (Signed)
Eeg done

## 2024-02-09 NOTE — Progress Notes (Signed)
9:31am Patient noted to be having seizure like activity.  Rapid Response and MD notified.  Patient transferred to ICU.

## 2024-02-09 NOTE — Procedures (Signed)
Routine EEG Report  Michelle Barnett is a 63 y.o. female with a history of seizure who is undergoing an EEG to evaluate for seizures.  Report: This EEG was acquired with electrodes placed according to the International 10-20 electrode system (including Fp1, Fp2, F3, F4, C3, C4, P3, P4, O1, O2, T3, T4, T5, T6, A1, A2, Fz, Cz, Pz). The following electrodes were missing or displaced: none.  The background rhythm was burst suppression with approximately 35% suppression. The bursts consisted of theta activity ~6 Hz with overriding beta frequencies. There was no clear waking rhythm or sleep architecture. There were no definitive epileptiform abnormalities. There were no electrographic seizures.  Impression and clinical correlation: This EEG was obtained while sedated on propofol and comatose and is abnormal due to burst suppression pattern indicative of global cerebral dysfunction, medication effect, or both. Definitive epileptiform abnormalities were not seen during this recording.  Bing Neighbors, MD [facility] [phone]  If 7pm- 7am, please page neurology on call as listed in AMION.

## 2024-02-09 NOTE — Consult Note (Signed)
NAME:  Michelle Barnett, MRN:  [id number], DOB:  Sep 01, 1961, LOS: 8 ADMISSION DATE:  02/01/2024, CONSULTATION DATE:  02/09/2024 REFERRING MD:  Dr. Meriam Sprague, CHIEF COMPLAINT:  Generalized tonic clonic seizure   Brief Pt Description / Synopsis:  63 y.o. female admitted following a fall at home with Acute Metabolic Encephalopathy, Acute Hypoxic Respiratory Failure in the setting of Community Acquired Pneumonia and Acute Asthma Exacerbation, A.fib with RVR, Acute Kidney Injury, and HSV 1 infection.  Course complicated on 02/09/24 by Generalized Tonic Clonic Seizure with severe hypoxia requiring intubation and mechanical ventilation for airway protection.  History of Present Illness:  Michelle Barnett is 63 y.o. female with hypertension, hyperlipidemia, iron deficiency anemia, who presented to the ED on 02/01/2024 with acute hypoxia.  Pt is currently intubated, sedated, and unable to contribute to history and no family is currently available, therefore history is obtained per chart review.  Per Hospitalist progress notes, "patient was previously in her PCP office where she was noted to be 66% on room air, this improved on 3 L O2 but could not get higher than 88%.  EMS was called and brought to the patient to the hospital.  In the ED she was found to be septic with a temperature of 100.7, respirations 20, tachycardic to 113 and hypoxic.  Labs were mostly unrevealing.  Patient underwent CT head, CT cervical spine, CT maxillofacial which revealed soft tissue swelling of the periorbital soft tissues of the left and along the left frontal scalp.  She received DuoNebs, azithromycin, ceftriaxone.  Hospital stay has been prolonged by ongoing hypoxia, inability to wean oxygen, and persistent delirium.  Patient has now completed her course of antibiotics.  We have initiated steroid taper, but she remains on 6 L nasal cannula.   Patient's stay was also complicated by A-fib RVR necessitating cardiology consult, amiodarone drip  and heparin drip which have since been transitioned to Eliquis and amiodarone p.o.  A-fib RVR likely provoked by hypoxia and breathing treatments. Patient stay is also been complicated by lip lesions which were initially thought to be impetigo but later resulted positive for HSV.  She has had some improvement with mupirocin and was initiated on valacyclovir."  Please see "Significant Hospital Events" section below for full detailed hospital course.    Pertinent  Medical History   Past Medical History:  Diagnosis Date   Asthma    Bicuspid aortic valve 09/20/2016   a. echo 09/19/16: EF 55-60%, GR1DD, possible bicuspid aortic valve without evidence of AS   Bronchitis    Coronary artery disease, non-occlusive    a. cath 09/21/16: ostLM to LM 20%, no evidence of aortic stenosis   Demand ischemia (HCC) 09/20/2016   Heart murmur    Hiatal hernia    History of blood transfusion    Hypertension    Persistent cough for 3 weeks or longer 05/19/2022   Ulcer     Micro Data:  2/4: COVID/Flu/RSV PCR>> negative 2/4: Blood cultures x2>> negative 2/5: HIV Screen>> negative 2/5: RPR>> negative 2/7: Mycoplasma pneumo IgM: < 770 2/7: Varicella-Zoster PCR>>negative 2/7: Wound>> Staphylococcus Epidermidis 2/8: Legionella urinary antigen>> negative 2/9: Respiratory viral panel>> negative  Antimicrobials:   Anti-infectives (From admission, onward)    Start     Dose/Rate Route Frequency Ordered Stop   02/09/24 1500  acyclovir (ZOVIRAX) 500 mg in dextrose 5 % 100 mL IVPB        500 mg 110 mL/hr over 60 Minutes Intravenous Every 12 hours 02/09/24 1326  02/08/24 1130  valACYclovir (VALTREX) tablet 1,000 mg  Status:  Discontinued        1,000 mg Oral 2 times daily 02/08/24 1102 02/09/24 1304   02/07/24 2200  doxycycline (VIBRA-TABS) tablet 100 mg  Status:  Discontinued        100 mg Oral Every 12 hours 02/07/24 1027 02/08/24 1101   02/06/24 1515  valACYclovir (VALTREX) tablet 1,000 mg  Status:   Discontinued        1,000 mg Oral Daily 02/06/24 1424 02/08/24 1102   02/03/24 1000  doxycycline (VIBRAMYCIN) 100 mg in sodium chloride 0.9 % 250 mL IVPB  Status:  Discontinued        100 mg 125 mL/hr over 120 Minutes Intravenous Every 12 hours 02/02/24 1328 02/07/24 1026   02/02/24 1100  azithromycin (ZITHROMAX) 500 mg in sodium chloride 0.9 % 250 mL IVPB  Status:  Discontinued        500 mg 250 mL/hr over 60 Minutes Intravenous Every 24 hours 02/01/24 1402 02/02/24 1328   02/02/24 1000  cefTRIAXone (ROCEPHIN) 2 g in sodium chloride 0.9 % 100 mL IVPB        2 g 200 mL/hr over 30 Minutes Intravenous Every 24 hours 02/01/24 1402 02/05/24 1832   02/01/24 1230  cefTRIAXone (ROCEPHIN) 2 g in sodium chloride 0.9 % 100 mL IVPB        2 g 200 mL/hr over 30 Minutes Intravenous  Once 02/01/24 1218 02/01/24 1309   02/01/24 1230  azithromycin (ZITHROMAX) 500 mg in sodium chloride 0.9 % 250 mL IVPB        500 mg 250 mL/hr over 60 Minutes Intravenous  Once 02/01/24 1218 02/01/24 1421       Significant Hospital Events: Including procedures, antibiotic start and stop dates in addition to other pertinent events   2/4: Admitted by [facility]. 2/12: Rapid response called due to witnessed Generalized Tonic Clonic seizure, self resolved.  Never lost pulse, however severely hypoxic, required emergent intubation and mechanical ventilation for airway protection.  Interim History / Subjective:  As outlined above under "Significant Hospital Events" section  Objective   Blood pressure (!) 156/92, pulse 77, temperature 97.9 F (36.6 C), temperature source Oral, resp. rate 18, height 4\' 9"  (1.448 m), weight 57.6 kg, SpO2 95%.        Intake/Output Summary (Last 24 hours) at 02/09/2024 0947 Last data filed at 02/09/2024 0216 Gross per 24 hour  Intake --  Output 200 ml  Net -200 ml   Filed Weights   02/01/24 1212  Weight: 57.6 kg    Examination: General: Critically ill-appearing female, laying in bed, with  agonal respirations HENT: Atraumatic, bruising noted to left forehead and periorbital region, along with HSV 1 lesions to the mouth which are with dried blood Lungs: Coarse breath sounds throughout, even, tachypnea, accessory muscle use Cardiovascular: Tachycardia, regular rhythm, S1-S2, no murmurs, rubs, gallops Abdomen: Soft, nontender, nondistended, no guarding rebound tenderness, bowel sounds positive x 4 Extremities: Normal bulk and tone, no deformities, no edema Neuro: Very lethargic, moves all extremities purposefully but not following commands, pupils PERRLA GU: External catheter in place  Resolved Hospital Problem list     Assessment & Plan:   #Generalized Tonic Clonic Seizure #Acute Metabolic Encephalopathy #Concern for possible Encephalitis given known HSV 1 infection #Sedation needs in setting of mechanical ventilation -Treatment of metabolic derangements as outlined below -Maintain a RASS goal of 0 to -1 -Fentanyl and Propofol as needed to maintain RASS goal -Avoid  sedating medications as able -Daily wake up assessment -CT Head and EEG is pending -Load with Keppra and start 500 mg BID dosing -Neurology consulted, appreciate input ~ dicussed with Dr. Selina Cooley, will start empiric Acyclovir and stop Valtrex ~ MRI Brain ordered -Will need LP, but will have to wait 48 hrs as she has been on Eliquis (stopped 2/12)  #Acute Hypoxic Respiratory Failure in the setting of CAP, pulmonary edema, and Asthma Exacerbation #Intubated for airway protection -Full vent support, implement lung protective strategies -Plateau pressures less than 30 cm H20 -Wean FiO2 & PEEP as tolerated to maintain O2 sats >92% -Follow intermittent Chest X-ray & ABG as needed -Spontaneous Breathing Trials when respiratory parameters met and mental status permits -Implement VAP Bundle -Bronchodilators -IV steroids -Completed course of ABX as above  #Hypotension: suspect sedation related #Atrial Fibrillation  with RVR ~ current in NSR PMHx: HTN Echocardiogram 02/02/24: LVEF 55-60%. Normal diastolic parameters. RV systolic function is normal.  RV is moderately enlarged, moderate MR, moderate TR, moderate AS -Continuous cardiac monitoring -Maintain MAP >65 -IV fluids -Vasopressors as needed to maintain MAP goal -Trend lactic acid until normalized -Trend HS Troponin until peaked -Diuresis as BP and renal function permits ~ holding due to shock and contraction alkalosis -Hold home Lisinopril and hydrochlorothiazide -Cardiology was following and has since signed off ~ recommends follow up in clinic in 2 weeks post discharge -Continue Amiodarone 200 mg BID -Hold Eliquis for now given need for LP  #Concern for Encephalitis #HSV-1 Infection #Community Acquired Pneumonia ~ TREATED -Monitor fever curve -Trend WBC's & Procalcitonin -Follow cultures as above -ID following, appreciate input ~ Continue empiric Acyclovir pending cultures & sensitivities -Needs LP, will have to wait 48 hrs as pt on Eliquis till 2/12  #Acute Kidney Injury #Hypernatremia #Metabolic Alkalosis, suspect contraction in setting of diuresis -Monitor I&O's / urinary output -Follow BMP -Ensure adequate renal perfusion -Avoid nephrotoxic agents as able -Replace electrolytes as indicated ~ Pharmacy following for assistance with electrolyte replacement -Start D5W  #Chronic Hiatal Hernia -Contributing to inability to place NG/OG tube -May benefit from General Surgery consultation outpatient      Best Practice (right click and "Reselect all SmartList Selections" daily)   Diet/type: NPO DVT prophylaxis: prophylactic heparin  GI prophylaxis: PPI Lines: N/A Foley:  Yes, and it is still needed Code Status:  full code Last date of multidisciplinary goals of care discussion [2/12]  2/12: Will update pt's family when they arrive at bedside.  Labs   CBC: Recent Labs  Lab 02/05/24 0432 02/06/24 0424 02/07/24 0505  02/08/24 0250 02/09/24 0438  WBC 6.1 6.7 7.2 6.3 5.8  NEUTROABS 5.1 5.7 5.8 5.4 4.6  HGB 10.8* 10.7* 10.6* 11.4* 11.9*  HCT 33.8* 34.2* 34.3* 36.7 38.1  MCV 96.0 99.1 98.8 100.0 98.7  PLT 159 194 217 219 231    Basic Metabolic Panel: Recent Labs  Lab 02/05/24 0432 02/06/24 0424 02/07/24 0505 02/08/24 0250 02/09/24 0438  NA 141 146* 149* 149* 154*  K 3.0* 3.5 3.9 3.7 3.5  CL 101 100 105 103 102  CO2 31 34* 37* 40* 40*  GLUCOSE 135* 160* 107* 150* 134*  BUN 50* 51* 52* 54* 47*  CREATININE 1.41* 1.52* 1.40* 1.26* 1.04*  CALCIUM 8.3* 8.9 9.3 8.9 9.3  MG 2.4 2.2 2.2 2.1 2.1  PHOS 2.6 3.7 1.8* 1.9* 1.8*   GFR: Estimated Creatinine Clearance: 40.4 mL/min (A) (by C-G formula based on SCr of 1.04 mg/dL (H)). Recent Labs  Lab 02/04/24 [phone]  02/05/24 0432 02/06/24 0424 02/07/24 0505 02/08/24 0250 02/09/24 0438  PROCALCITON 0.40  --   --   --   --   --   WBC 6.5   < > 6.7 7.2 6.3 5.8   < > = values in this interval not displayed.    Liver Function Tests: Recent Labs  Lab 02/05/24 0432 02/06/24 0424 02/07/24 0505 02/08/24 0250 02/09/24 0438  AST 46* 36 36 60* 77*  ALT 23 21 25  41 64*  ALKPHOS 28* 29* 28* 31* 31*  BILITOT 0.8 0.3 0.3 0.7 0.7  PROT 5.6* 5.6* 5.5* 5.3* 5.6*  ALBUMIN 2.5* 2.5* 2.5* 2.6* 2.7*   No results for input(s): "LIPASE", "AMYLASE" in the last 168 hours. No results for input(s): "AMMONIA" in the last 168 hours.  ABG    Component Value Date/Time   PHART 7.47 (H) 02/08/2024 1329   PCO2ART 66 (HH) 02/08/2024 1329   PO2ART 102 02/08/2024 1329   HCO3 48.0 (H) 02/08/2024 1329   O2SAT 99 02/08/2024 1329     Coagulation Profile: No results for input(s): "INR", "PROTIME" in the last 168 hours.  Cardiac Enzymes: No results for input(s): "CKTOTAL", "CKMB", "CKMBINDEX", "TROPONINI" in the last 168 hours.  HbA1C: Hgb A1c MFr Bld  Date/Time Value Ref Range Status  05/19/2022 08:06 AM 5.8 4.6 - 6.5 % Final    Comment:    Glycemic Control  Guidelines for People with Diabetes:Non Diabetic:  <6%Goal of Therapy: <7%Additional Action Suggested:  >8%   05/21/2021 12:57 PM 5.6 4.6 - 6.5 % Final    Comment:    Glycemic Control Guidelines for People with Diabetes:Non Diabetic:  <6%Goal of Therapy: <7%Additional Action Suggested:  >8%     CBG: Recent Labs  Lab 02/04/24 2054 02/04/24 2334  GLUCAP 146* 142*    Review of Systems:   Unable to assess due to AMS/intubation/sedation   Past Medical History:  She,  has a past medical history of Asthma, Bicuspid aortic valve (09/20/2016), Bronchitis, Coronary artery disease, non-occlusive, Demand ischemia (HCC) (09/20/2016), Heart murmur, Hiatal hernia, History of blood transfusion, Hypertension, Persistent cough for 3 weeks or longer (05/19/2022), and Ulcer.   Surgical History:   Past Surgical History:  Procedure Laterality Date   abdominal tumor     ABLATION     CARDIAC CATHETERIZATION N/A 09/21/2016   Procedure: Left Heart Cath and Coronary Angiography;  Surgeon: Iran Ouch, MD;  Location: [facility] INVASIVE CV LAB;  Service: Cardiovascular;  Laterality: N/A;   COLONOSCOPY N/A 08/14/2016   Procedure: COLONOSCOPY;  Surgeon: Sherrilyn Rist, MD;  Location: [facility] ENDOSCOPY;  Service: Endoscopy;  Laterality: N/A;   ESOPHAGOGASTRODUODENOSCOPY N/A 08/13/2016   Procedure: ESOPHAGOGASTRODUODENOSCOPY (EGD);  Surgeon: Sherrilyn Rist, MD;  Location: [facility] ENDOSCOPY;  Service: Gastroenterology;  Laterality: N/A;   TOOTH EXTRACTION       Social History:   reports that she has never smoked. She has never used smokeless tobacco. She reports that she does not drink alcohol and does not use drugs.   Family History:  Her family history includes Breast cancer in her maternal aunt; Heart disease in her father; Hypertension in her brother, father, and mother; Stroke in her mother.   Allergies Allergies  Allergen Reactions   Amlodipine Rash   Aspirin Anaphylaxis   Dairy Aid [Tilactase] Swelling  and Other (See Comments)    Any dairy products   Benadryl [Diphenhydramine] Palpitations   Penicillins Other (See Comments)    Reaction: Unknown  Home Medications  Prior to Admission medications   Medication Sig Start Date End Date Taking? Authorizing Provider  albuterol (VENTOLIN HFA) 108 (90 Base) MCG/ACT inhaler INHALE 2 PUFFS BY MOUTH EVERY 6 HOURS AS NEEDED FOR WHEEZING FOR SHORTNESS OF BREATH 11/28/23  Yes Doreene Nest, NP  Ascorbic Acid (VITAMIN C) 1000 MG tablet Take 1,000 mg by mouth 2 (two) times daily.    Yes [provider]  atorvastatin (LIPITOR) 40 MG tablet TAKE 1 TABLET BY MOUTH ONCE DAILY FOR CHOLESTEROL 05/30/23  Yes Doreene Nest, NP  ferrous sulfate 325 (65 FE) MG tablet Take 650 mg by mouth 2 (two) times daily.   Yes [provider]  Fluticasone Furoate (ARNUITY ELLIPTA) 100 MCG/ACT AEPB Inhale 1 puff into the lungs daily. 05/21/23  Yes Doreene Nest, NP  lisinopril-hydrochlorothiazide (ZESTORETIC) 20-25 MG tablet Take 1 tablet by mouth once daily for blood pressure 11/28/23  Yes Doreene Nest, NP  magnesium oxide (MAG-OX) 400 MG tablet Take 400 mg by mouth daily.   Yes [provider]  Multiple Vitamins-Calcium (ONE-A-DAY WOMENS PO) Take 1 tablet by mouth daily.   Yes [provider]  Omega-3 Fatty Acids (FISH OIL) 1200 MG CAPS Take 1 capsule by mouth daily.    Yes [provider]  vitamin B-12 (CYANOCOBALAMIN) 1000 MCG tablet Take 1,000 mcg by mouth daily.   Yes [provider]  benzonatate (TESSALON) 200 MG capsule Take 1 capsule (200 mg total) by mouth 3 (three) times daily as needed for cough. Patient not taking: Reported on 02/01/2024 09/24/23   Doreene Nest, NP     Critical care time: 60 minutes     Harlon Ditty, AGACNP-BC [facility] Prefer epic messenger for cross cover needs If after hours, please call E-link

## 2024-02-09 NOTE — Progress Notes (Signed)
Date of Admission:  02/01/2024      ID: Michelle Barnett is a 63 y.o. female Principal Problem:   Pneumonia of right lower lobe due to infectious organism Active Problems:   Essential hypertension   Vitamin B12 deficiency   Adjustment disorder with mixed anxiety and depressed mood   Acute hypoxic respiratory failure (HCC)   GERD (gastroesophageal reflux disease)   Mild intermittent asthma without complication   Hyperlipidemia   Hypoxia   AKI (acute kidney injury) (HCC)   Ecchymosis of left eye   Sepsis (HCC)   Paroxysmal atrial fibrillation (HCC)   Atrial fibrillation with RVR (HCC)   Hiatal hernia   Pneumonia   HSV-1 infection   Acute encephalopathy  Michelle Barnett is a 63 y.o. with a history of HTN, IDA  COPD,  , Presents with fall on Sunday night and was hallucinating, had hematoma left eye, abraded lips, has chronic cough wbnt to her PCP's office on 02/01/24 and pulse ox was low (66%)  and she was sent to ED Had a hematoma above left eye, bruising around left eye Vitals in the ED 100.7, RR20, pulse 113, BP 110/62 Labs revealed Na 132, BUN 71, Cr 1.19, WBC 8.8, HB 13.9, PLT 115 Lactic acid 1 Ct head - no acute findings Blood culture sent Started on ceftriaxone and azithromycin for possible pneumonia EKG- RBBB and then Afib Started IV amiodarone Given IV lasix without much improvement I am asked to see her for the lip lesions whether it could be RIME, mycoplasma related skin and mucous lesions  Subjective Pt had seizures this monring and had to be moved to ICU She was intubated to protect air way Neuro saw her and concern for HSV encephalitis Started on Iv acyclovir and valtrex DC MRI brain with contrast ordered  Medications:   amiodarone  200 mg Per Tube BID   atorvastatin  40 mg Per Tube QHS   Chlorhexidine Gluconate Cloth  6 each Topical Daily   [START ON 02/10/2024] cyanocobalamin  1,000 mcg Per Tube Daily   docusate  100 mg Per Tube BID   [START ON  02/10/2024] ezetimibe  10 mg Per Tube Daily   ipratropium-albuterol  3 mL Nebulization TID   methylPREDNISolone (SOLU-MEDROL) injection  40 mg Intravenous Q1500   midazolam       mupirocin ointment   Nasal BID   pantoprazole (PROTONIX) IV  40 mg Intravenous Daily   polyethylene glycol  17 g Per Tube Daily   [START ON 02/10/2024] QUEtiapine  12.5 mg Per Tube Daily    Objective: Vital signs in last 24 hours: Patient Vitals for the past 24 hrs:  BP Temp Temp src Pulse Resp SpO2  02/09/24 1557 -- -- -- -- -- 99 %  02/09/24 1500 (!) 99/46 -- -- -- 15 --  02/09/24 1400 (!) 102/50 -- -- 70 15 100 %  02/09/24 1321 -- -- -- -- -- 97 %  02/09/24 1300 108/70 -- -- 73 15 100 %  02/09/24 1200 118/60 -- -- 64 18 99 %  02/09/24 1100 134/69 (!) 97.2 F (36.2 C) Axillary 64 15 99 %  02/09/24 1000 (!) 172/91 -- -- 80 16 100 %  02/09/24 0928 (!) 156/92 -- -- -- -- --  02/09/24 0911 (!) 156/92 97.9 F (36.6 C) Oral 77 18 95 %  02/09/24 0749 -- -- -- -- -- 97 %  02/09/24 0325 (!) 166/86 98 F (36.7 C) -- 75 20 94 %  02/08/24 2338 (!) 150/78 97.8 F (36.6 C) -- 78 20 97 %  02/08/24 2028 -- -- -- -- -- 94 %  02/08/24 1932 (!) 157/76 97.7 F (36.5 C) -- 86 18 91 %     PHYSICAL EXAM:  General:intubated  Perioral lesions, scabs falling off-   Lungs: b/l air entry, crepts Heart: s1s2 systolic murmur Abdomen: Soft, non-tender,not distended. Bowel sounds normal. No masses Extremities: atraumatic, no cyanosis. No edema. No clubbing Skin: No rashes or lesions. Or bruising Lymph: Cervical, supraclavicular normal. Neurologic:   cannot assess  Lab Results    Latest Ref Rng & Units 02/09/2024    4:38 AM 02/08/2024    2:50 AM 02/07/2024    5:05 AM  CBC  WBC 4.0 - 10.5 K/uL 5.8  6.3  7.2   Hemoglobin 12.0 - 15.0 g/dL 29.5  62.1  30.8   Hematocrit 36.0 - 46.0 % 38.1  36.7  34.3   Platelets 150 - 400 K/uL 231  219  217        Latest Ref Rng & Units 02/09/2024    4:38 AM 02/08/2024    2:50 AM  02/07/2024    5:05 AM  CMP  Glucose 70 - 99 mg/dL 657  846  962   BUN 8 - 23 mg/dL 47  54  52   Creatinine 0.44 - 1.00 mg/dL 9.52  8.41  3.24   Sodium 135 - 145 mmol/L 154  149  149   Potassium 3.5 - 5.1 mmol/L 3.5  3.7  3.9   Chloride 98 - 111 mmol/L 102  103  105   CO2 22 - 32 mmol/L 40  40  37   Calcium 8.9 - 10.3 mg/dL 9.3  8.9  9.3   Total Protein 6.5 - 8.1 g/dL 5.6  5.3  5.5   Total Bilirubin 0.0 - 1.2 mg/dL 0.7  0.7  0.3   Alkaline Phos 38 - 126 U/L 31  31  28    AST 15 - 41 U/L 77  60  36   ALT 0 - 44 U/L 64  41  25       Microbiology: BC- NG HSV DNA of perioral lesion positive for HSV 1 Studies/Results: DG Abd Portable 1V Result Date: 02/09/2024 CLINICAL DATA:  Orogastric tube placement. EXAM: PORTABLE ABDOMEN - 1 VIEW COMPARISON:  Chest CT 02/04/2024 FINDINGS: The enteric tube is looped within a large hiatal hernia. Tip is directed distally in the region of the diaphragmatic hiatus. IMPRESSION: Enteric tube looped within a large hiatal hernia. Tip is directed distally in the region of the diaphragmatic hiatus. Electronically Signed   By: Narda Rutherford M.D.   On: 02/09/2024 16:01   CT HEAD WO CONTRAST ( ) Result Date: 02/09/2024 CLINICAL DATA:  Provided history: Head trauma, abnormal mental status. EXAM: CT HEAD WITHOUT CONTRAST TECHNIQUE: Contiguous axial images were obtained from the base of the skull through the vertex without intravenous contrast. RADIATION DOSE REDUCTION: This exam was performed according to the departmental dose-optimization program which includes automated exposure control, adjustment of the mA and/or kV according to patient size and/or use of iterative reconstruction technique. COMPARISON:  Head CT 02/01/2024. FINDINGS: Brain: Mild generalized parenchymal atrophy. Foci of abnormal cortical/subcortical hypodensity within the left parietal and bilateral occipital lobes, new from the prior head CT of 02/01/2024. There is no acute intracranial hemorrhage.  No extra-axial fluid collection. No evidence of an intracranial mass. No midline shift. Vascular: No hyperdense vessel. Atherosclerotic calcifications. Skull: No  calvarial fracture or aggressive osseous lesion. Sinuses/Orbits: No mass or acute finding within the imaged orbits. No significant paranasal sinus disease at the imaged levels. Other: Persistent left anterior scalp/forehead soft tissue swelling. Impression #1 will be called to the ordering clinician or representative by the Radiologist Assistant, and communication documented in the PACS or [employer]. IMPRESSION: 1. Foci of abnormal cortical/subcortical hypodensity within the left parietal and bilateral occipital lobes, new from the prior head CT of 02/01/2024. 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 A brain MRI is recommended for further evaluation. 2. Persistent left anterior scalp/forehead soft tissue swelling. 3. Mild generalized parenchymal atrophy. Electronically Signed   By: Jackey Loge D.O.   On: 02/09/2024 13:44   DG Chest Port 1 View Result Date: 02/09/2024 CLINICAL DATA:  Intubation. EXAM: PORTABLE CHEST 1 VIEW COMPARISON:  Chest radiograph dated 02/07/2024. FINDINGS: Endotracheal tube with tip approximately 1.5 cm above the carina. Recommend retraction by 3 cm. Enteric tube makes a loop in the large hiatal hernia with tip in the region of the gastroesophageal junction. No significant interval change in bilateral mid to lower lung field airspace opacities and small bilateral pleural effusions. No pneumothorax. Stable cardiac silhouette no acute osseous pathology. IMPRESSION: 1. Endotracheal tube with tip approximately 1.5 cm above the carina. Recommend retraction by 3 cm. 2. Enteric tube makes a loop in the large hiatal hernia with tip in the region of the gastroesophageal junction. 3. No significant interval change in  bilateral airspace opacities and small bilateral pleural effusions. Electronically Signed   By: Elgie Collard M.D.   On: 02/09/2024 12:19      Assessment/Plan: 63 yr female presenting with fall, sob X 1 day duration and not feeling well for a few days   ?Acute hypoxic resp failure with hypercapnia- COPD exacerbation/ Community Acquired Pneumonia/[facility]  ceftriaxone  ( completed 5 days) and Doxy- and then Doxy- managed as per hosptialist PE NEg  urine legionella /mycoplasma Neg   Acute encephalopathy Etiology was thought to be Secondary to Co2 retention, hypoxia and high dose steroids as per hospitalist  Perioral lesions due to HSV 1  Started valtrex adjusted to crcl for 5-7 days Watch closely for sz, because of AKI  I had signed off yesterday But today back on the case as she had seizures and there is concern for HSV encephalitis as she had seizures MRI with contrast pending- look for temporal lobe enhancement  Started on IV acyclovir - adjust dose to treat for HSV encephalitis  Fall with ecchymosis left eye Hematoma   Afib with RVR on amiodarone- well controlled     Thrombocytopenia resolved   AKI  improving  Discussed the management with the ICU team

## 2024-02-10 ENCOUNTER — Inpatient Hospital Stay: Payer: No Typology Code available for payment source

## 2024-02-10 DIAGNOSIS — B009 Herpesviral infection, unspecified: Secondary | ICD-10-CM | POA: Diagnosis not present

## 2024-02-10 DIAGNOSIS — J9601 Acute respiratory failure with hypoxia: Secondary | ICD-10-CM | POA: Diagnosis not present

## 2024-02-10 DIAGNOSIS — J189 Pneumonia, unspecified organism: Secondary | ICD-10-CM | POA: Diagnosis not present

## 2024-02-10 DIAGNOSIS — G9340 Encephalopathy, unspecified: Secondary | ICD-10-CM | POA: Diagnosis not present

## 2024-02-10 DIAGNOSIS — N179 Acute kidney failure, unspecified: Secondary | ICD-10-CM | POA: Diagnosis not present

## 2024-02-10 LAB — COMPREHENSIVE METABOLIC PANEL
ALT: 66 U/L — ABNORMAL HIGH (ref 0–44)
AST: 63 U/L — ABNORMAL HIGH (ref 15–41)
Albumin: 2.3 g/dL — ABNORMAL LOW (ref 3.5–5.0)
Alkaline Phosphatase: 28 U/L — ABNORMAL LOW (ref 38–126)
Anion gap: 8 (ref 5–15)
BUN: 38 mg/dL — ABNORMAL HIGH (ref 8–23)
CO2: 37 mmol/L — ABNORMAL HIGH (ref 22–32)
Calcium: 8.2 mg/dL — ABNORMAL LOW (ref 8.9–10.3)
Chloride: 98 mmol/L (ref 98–111)
Creatinine, Ser: 1 mg/dL (ref 0.44–1.00)
GFR, Estimated: >60 mL/min (ref >60)
Glucose, Bld: 132 mg/dL — ABNORMAL HIGH (ref 70–99)
Potassium: 3 mmol/L — ABNORMAL LOW (ref 3.5–5.1)
Sodium: 143 mmol/L (ref 135–145)
Total Bilirubin: 0.7 mg/dL (ref 0.0–1.2)
Total Protein: 4.5 g/dL — ABNORMAL LOW (ref 6.5–8.1)

## 2024-02-10 LAB — CBC WITH DIFFERENTIAL/PLATELET
Abs Immature Granulocytes: 0.09 10*3/uL — ABNORMAL HIGH (ref 0.00–0.07)
Basophils Absolute: 0 10*3/uL (ref 0.0–0.1)
Basophils Relative: 0 %
Eosinophils Absolute: 0 10*3/uL (ref 0.0–0.5)
Eosinophils Relative: 0 %
HCT: 29.5 % — ABNORMAL LOW (ref 36.0–46.0)
Hemoglobin: 9.7 g/dL — ABNORMAL LOW (ref 12.0–15.0)
Immature Granulocytes: 1 %
Lymphocytes Relative: 11 %
Lymphs Abs: 0.8 10*3/uL (ref 0.7–4.0)
MCH: 31 pg (ref 26.0–34.0)
MCHC: 32.9 g/dL (ref 30.0–36.0)
MCV: 94.2 fL (ref 80.0–100.0)
Monocytes Absolute: 1 10*3/uL (ref 0.1–1.0)
Monocytes Relative: 13 %
Neutro Abs: 5.6 10*3/uL (ref 1.7–7.7)
Neutrophils Relative %: 75 %
Platelets: 200 10*3/uL (ref 150–400)
RBC: 3.13 MIL/uL — ABNORMAL LOW (ref 3.87–5.11)
RDW: 14.4 % (ref 11.5–15.5)
WBC: 7.5 10*3/uL (ref 4.0–10.5)
nRBC: 0 % (ref 0.0–0.2)

## 2024-02-10 LAB — BLOOD GAS, ARTERIAL
Acid-Base Excess: 16.5 mmol/L — ABNORMAL HIGH (ref 0.0–2.0)
Bicarbonate: 40.3 mmol/L — ABNORMAL HIGH (ref 20.0–28.0)
FIO2: 40 %
MECHVT: 400 mL
Mechanical Rate: 15
O2 Saturation: 99.3 %
PEEP: 5 cmH2O
Patient temperature: 37
pCO2 arterial: 43 mm[Hg] (ref 32–48)
pH, Arterial: 7.58 — ABNORMAL HIGH (ref 7.35–7.45)
pO2, Arterial: 144 mm[Hg] — ABNORMAL HIGH (ref 83–108)

## 2024-02-10 LAB — GLUCOSE, CAPILLARY
Glucose-Capillary: 193 mg/dL — ABNORMAL HIGH (ref 70–99)
Glucose-Capillary: 152 mg/dL — ABNORMAL HIGH (ref 70–99)
Glucose-Capillary: 65 mg/dL — ABNORMAL LOW (ref 70–99)
Glucose-Capillary: 104 mg/dL — ABNORMAL HIGH (ref 70–99)
Glucose-Capillary: 101 mg/dL — ABNORMAL HIGH (ref 70–99)

## 2024-02-10 LAB — PHOSPHORUS: Phosphorus: 2.5 mg/dL (ref 2.5–4.6)

## 2024-02-10 LAB — TRIGLYCERIDES: Triglycerides: 113 mg/dL (ref <150)

## 2024-02-10 LAB — MAGNESIUM: Magnesium: 1.7 mg/dL (ref 1.7–2.4)

## 2024-02-10 MED ORDER — METHYLPREDNISOLONE SODIUM SUCC 40 MG IJ SOLR
20.0000 mg | Freq: Every day | INTRAMUSCULAR | Status: AC
  Administered 2024-02-14 – 2024-02-16 (×3): 20 mg via INTRAVENOUS
  Filled 2024-02-10 (×3): qty 1

## 2024-02-10 MED ORDER — METHYLPREDNISOLONE SODIUM SUCC 40 MG IJ SOLR: 10.0000 mg | Freq: Every day | INTRAMUSCULAR | Status: DC

## 2024-02-10 MED ORDER — METHYLPREDNISOLONE SODIUM SUCC 40 MG IJ SOLR
30.0000 mg | Freq: Every day | INTRAMUSCULAR | Status: AC
  Administered 2024-02-11 – 2024-02-13 (×3): 30 mg via INTRAVENOUS
  Filled 2024-02-10 (×3): qty 1

## 2024-02-10 MED ORDER — POTASSIUM CHLORIDE 10 MEQ/100ML IV SOLN
10.0000 meq | INTRAVENOUS | Status: AC
  Administered 2024-02-10 (×3): 10 meq via INTRAVENOUS
  Filled 2024-02-10 (×3): qty 100

## 2024-02-10 MED ORDER — THIAMINE HCL 100 MG/ML IJ SOLN
100.0000 mg | Freq: Every day | INTRAMUSCULAR | Status: DC
Start: 2024-02-10 — End: 2024-02-15
  Administered 2024-02-10 – 2024-02-14 (×5): 100 mg via INTRAVENOUS
  Filled 2024-02-10 (×5): qty 2

## 2024-02-10 MED ORDER — DEXTROSE 50 % IV SOLN
25.0000 mL | Freq: Once | INTRAVENOUS | Status: AC
  Administered 2024-02-10: 25 mL via INTRAVENOUS

## 2024-02-10 NOTE — Progress Notes (Signed)
Initial Nutrition Assessment  DOCUMENTATION CODES:   Not applicable  INTERVENTION:   If enteral access able to be gained  Vital HP @45ml /hr- Initiate at 65ml/hr and increase by 46ml/hr q 8 hours until goal rate is reached.   Free water flushes q4 hours   Regimen provides 1080kcal/day, 95g/day protein and 1549ml/day of free water   MVI daily via tube   Pt at high refeed risk; recommend monitor potassium, magnesium and phosphorus labs daily until stable  Recommend thiamine 100mg  IV daily x 7 days   Daily weights   NUTRITION DIAGNOSIS:   Inadequate oral intake related to inability to eat (pt sedated and ventilated) as evidenced by NPO status.  GOAL:   Provide needs based on ASPEN/SCCM guidelines  MONITOR:   Vent status, Labs, Weight trends, TF tolerance, I & O's, Skin  REASON FOR ASSESSMENT:   Ventilator    ASSESSMENT:   62 y/o female with h/o HTN, anxiety, depression, GERD, HLD, asthma, hiatal hernia, COPD, PUD, CAD and IDA who is admitted with PNA, HSV-1, AKI, seizures, A-fib RVR and AMS.  Pt sedated and ventilated. OGT unable to be placed secondary to a large hiatal hernia. Will plan to initiate tube feeds if enteral access is able to be gained. Per chart, pt eating <25% of meals prior to intubation. Pt is actively refeeding; electrolytes being monitored and supplemented per pharmacy. Pt with hypernatremia; IVF initiated yesterday. Per chart, pt appears weight stable at baseline; however, pt has not been weighed this admission.   Medications reviewed and include: B12, colace, solu-medrol, protonix, miralax, 5% dextrose @100ml /hr, propofol   Labs reviewed: Na 154(H), BUN 47(H), creat 1.04(H), P 1.8(L), Mg 2.1 wnl Cbgs- 152, 193 x 24 hrs  AIC 5.8- 04/2022  Patient is currently intubated on ventilator support MV: 7.1 L/min Temp (24hrs), Avg:97.8 F (36.6 C), Min:97.6 F (36.4 C), Max:98 F (36.7 C)  Propofol: 13.82 ml/hr- provides 365kcal/day   MAP-  >17mmHg   UOP-   NUTRITION - FOCUSED PHYSICAL EXAM:  Flowsheet Row Most Recent Value  Orbital Region Mild depletion  Upper Arm Region No depletion  Thoracic and Lumbar Region No depletion  Buccal Region No depletion  Temple Region Mild depletion  Clavicle Bone Region Mild depletion  Clavicle and Acromion Bone Region Mild depletion  Scapular Bone Region No depletion  Dorsal Hand Unable to assess  Patellar Region No depletion  Anterior Thigh Region No depletion  Posterior Calf Region No depletion  Edema (RD Assessment) None  Hair Reviewed  Eyes Reviewed  Mouth Reviewed  Skin Reviewed  Nails Reviewed   Diet Order:   Diet Order             Diet NPO time specified  Diet effective now                  EDUCATION NEEDS:   No education needs have been identified at this time  Skin:  Skin Assessment: Reviewed RN Assessment (hematoma around her L eye and facial bruising after recent fall, ecchymosis)  Last BM:  2/13- type 6  Height:   Ht Readings from Last 1 Encounters:  02/01/24 4\' 9"  (1.448 m)    Weight:   Wt Readings from Last 1 Encounters:  02/01/24 57.6 kg    Ideal Body Weight:  43 kg  BMI:  Body mass index is 27.48 kg/m.  Estimated Nutritional Needs:   Kcal:  1091kcal/day  Protein:  85-95g/day  Fluid:  1.3-1.5L/day  Betsey Holiday MS,  RD, LDN If unable to be reached, please send secure chat to "RD inpatient" available from 8:00a-4:00p daily

## 2024-02-10 NOTE — Progress Notes (Signed)
Date of Admission:  02/01/2024      [name] is a 63 y.o. female Principal Problem:   Pneumonia of right lower lobe due to infectious organism Active Problems:   Essential hypertension   Vitamin B12 deficiency   Adjustment disorder with mixed anxiety and depressed mood   Acute hypoxic respiratory failure (HCC)   GERD (gastroesophageal reflux disease)   Mild intermittent asthma without complication   Hyperlipidemia   Hypoxia   AKI (acute kidney injury) (HCC)   Ecchymosis of left eye   Sepsis (HCC)   Paroxysmal atrial fibrillation (HCC)   Atrial fibrillation with RVR (HCC)   Hiatal hernia   Pneumonia   HSV-1 infection   Acute encephalopathy  Michelle Barnett is a 63 y.o. with a history of HTN, IDA  COPD,  , Presents with fall on Sunday night and was hallucinating, had hematoma left eye, abraded lips, has chronic cough wbnt to her PCP's office on 02/01/24 and pulse ox was low (66%)  and she was sent to ED Had a hematoma above left eye, bruising around left eye Vitals in the ED 100.7, RR20, pulse 113, BP 110/62 Labs revealed Na 132, BUN 71, Cr 1.19, WBC 8.8, HB 13.9, PLT 115 Lactic acid 1 Ct head - no acute findings Blood culture sent Started on ceftriaxone and azithromycin for possible pneumonia EKG- RBBB and then Afib Started IV amiodarone Given IV lasix without much improvement I am asked to see her for the lip lesions whether it could be RIME, mycoplasma related skin and mucous lesions  Subjective Pt remains intubated  Medications:   amiodarone  200 mg Per Tube BID   atorvastatin  40 mg Per Tube QHS   Chlorhexidine Gluconate Cloth  6 each Topical Daily   cyanocobalamin  1,000 mcg Per Tube Daily   docusate  100 mg Per Tube BID   ezetimibe  10 mg Per Tube Daily   ipratropium-albuterol  3 mL Nebulization TID   [START ON 02/11/2024] methylPREDNISolone (SOLU-MEDROL) injection  30 mg Intravenous Daily   Followed by   Melene Muller ON 02/14/2024] methylPREDNISolone  (SOLU-MEDROL) injection  20 mg Intravenous Daily   Followed by   Melene Muller ON 02/17/2024] methylPREDNISolone (SOLU-MEDROL) injection  10 mg Intravenous Daily   mupirocin ointment   Nasal BID   mouth rinse  15 mL Mouth Rinse Q2H   pantoprazole (PROTONIX) IV  40 mg Intravenous Daily   polyethylene glycol  17 g Per Tube Daily   QUEtiapine  12.5 mg Per Tube Daily   thiamine (VITAMIN B1) injection  100 mg Intravenous Daily    Objective: Vital signs in last 24 hours: Patient Vitals for the past 24 hrs:  BP Temp Temp src Pulse Resp SpO2  02/10/24 1700 125/60 -- -- 87 17 100 %  02/10/24 1600 (!) 99/51 98.4 F (36.9 C) -- 85 16 95 %  02/10/24 1500 122/72 -- -- 94 (!) 23 96 %  02/10/24 1400 138/61 -- -- 83 (!) 22 99 %  02/10/24 1343 133/62 -- -- 86 (!) 23 99 %  02/10/24 1317 -- -- -- -- -- 98 %  02/10/24 1305 -- -- -- -- -- 98 %  02/10/24 1300 126/61 -- -- 73 17 99 %  02/10/24 1200 (!) 128/59 98 F (36.7 C) -- 79 19 100 %  02/10/24 1100 (!) 105/51 -- -- 64 15 100 %  02/10/24 1000 (!) 103/53 -- -- 60 14 100 %  02/10/24 0900 (!) 94/50 -- --  63 15 99 %  02/10/24 0843 -- -- -- -- -- 100 %  02/10/24 0800 124/67 97.6 F (36.4 C) -- 69 16 100 %  02/10/24 0700 114/70 -- -- (!) 56 14 100 %  02/10/24 0645 -- -- -- (!) 59 15 100 %  02/10/24 0630 (!) 109/58 -- -- (!) 56 15 100 %  02/10/24 0615 (!) 109/56 -- -- (!) 59 15 100 %  02/10/24 0600 118/64 -- -- (!) 56 15 100 %  02/10/24 0552 -- -- -- (!) 56 15 100 %  02/10/24 0545 116/61 -- -- (!) 58 15 100 %  02/10/24 0530 (!) 111/58 -- -- (!) 56 15 100 %  02/10/24 0515 (!) 98/51 -- -- 61 15 100 %  02/10/24 0500 (!) 99/49 -- -- 61 15 100 %  02/10/24 0445 (!) 100/48 -- -- 61 15 100 %  02/10/24 0430 (!) 96/50 -- -- 62 15 100 %  02/10/24 0415 (!) 97/47 -- -- 64 15 100 %  02/10/24 0400 (!) 105/50 97.8 F (36.6 C) Axillary 65 15 100 %  02/10/24 0345 (!) 88/52 -- -- 66 15 100 %  02/10/24 0230 (!) 102/56 -- -- 64 15 100 %  02/10/24 0215 (!) 112/59 -- -- 62  15 100 %  02/10/24 0200 (!) 113/58 -- -- 63 15 100 %  02/10/24 0157 -- -- -- 63 15 100 %  02/10/24 0145 123/61 -- -- (!) 59 15 100 %  02/10/24 0130 113/60 -- -- 64 15 100 %  02/10/24 0115 117/63 -- -- 67 15 100 %  02/10/24 0100 (!) 110/54 -- -- 63 15 100 %  02/10/24 0045 (!) 98/56 -- -- 66 15 100 %  02/10/24 0030 (!) 100/54 -- -- 67 15 100 %  02/10/24 0015 (!) 96/57 -- -- 71 15 100 %  02/10/24 0000 (!) 107/56 97.8 F (36.6 C) Axillary 67 15 100 %  02/09/24 2345 (!) 111/57 -- -- 65 15 100 %  02/09/24 2330 (!) 118/58 -- -- 62 15 99 %  02/09/24 2315 124/64 -- -- 62 17 100 %  02/09/24 2308 120/67 -- -- 71 16 100 %  02/09/24 2300 -- -- -- 74 19 99 %  02/09/24 2245 -- -- -- 77 17 100 %  02/09/24 2230 126/63 -- -- -- 10 --  02/09/24 2145 (!) 83/48 -- -- 78 15 98 %  02/09/24 2130 (!) 101/50 -- -- 72 15 99 %  02/09/24 2115 (!) 104/51 -- -- 69 15 96 %  02/09/24 2100 (!) 105/51 -- -- 70 15 98 %  02/09/24 2045 (!) 101/53 -- -- 71 15 99 %  02/09/24 2030 (!) 113/51 -- -- 70 15 98 %  02/09/24 2015 (!) 112/53 -- -- 68 15 99 %  02/09/24 2000 (!) 122/56 97.9 F (36.6 C) Axillary 68 15 100 %  02/09/24 1945 (!) 118/57 -- -- 71 15 100 %  02/09/24 1930 (!) 125/59 -- -- 60 15 98 %  02/09/24 1915 (!) 120/57 -- -- 61 15 98 %     PHYSICAL EXAM:  General:intubated Perioral lesions, scabs  Lungs: b/l air entry, crepts Heart: s1s2 systolic murmur Abdomen: Soft, non-tender,not distended. Bowel sounds normal. No masses Extremities: atraumatic, no cyanosis. No edema. No clubbing Skin: No rashes or lesions. Or bruising Lymph: Cervical, supraclavicular normal. Neurologic:   cannot assess  Lab Results    Latest Ref Rng & Units 02/10/2024    1:00 PM 02/09/2024  4:38 AM 02/08/2024    2:50 AM  CBC  WBC 4.0 - 10.5 K/uL 7.5  5.8  6.3   Hemoglobin 12.0 - 15.0 g/dL 9.7  78.4  69.6   Hematocrit 36.0 - 46.0 % 29.5  38.1  36.7   Platelets 150 - 400 K/uL 200  231  219        Latest Ref Rng & Units  02/10/2024    1:00 PM 02/09/2024    4:38 AM 02/08/2024    2:50 AM  CMP  Glucose 70 - 99 mg/dL 295  284  132   BUN 8 - 23 mg/dL 38  47  54   Creatinine 0.44 - 1.00 mg/dL 4.40  1.02  7.25   Sodium 135 - 145 mmol/L 143  154  149   Potassium 3.5 - 5.1 mmol/L 3.0  3.5  3.7   Chloride 98 - 111 mmol/L 98  102  103   CO2 22 - 32 mmol/L 37  40  40   Calcium 8.9 - 10.3 mg/dL 8.2  9.3  8.9   Total Protein 6.5 - 8.1 g/dL 4.5  5.6  5.3   Total Bilirubin 0.0 - 1.2 mg/dL 0.7  0.7  0.7   Alkaline Phos 38 - 126 U/L 28  31  31    AST 15 - 41 U/L 63  77  60   ALT 0 - 44 U/L 66  64  41       Microbiology: BC- NG HSV DNA of perioral lesion positive for HSV 1 Studies/Results: DG Chest Port 1 View Result Date: 02/10/2024 CLINICAL DATA:  Central line placement attempt EXAM: PORTABLE CHEST 1 VIEW COMPARISON:  Same-day x-ray FINDINGS: Endotracheal tube terminates 2.4 cm above the carina. No central venous lines are seen within the field of view. Overlying cardiac leads. Stable heart size. Bilateral layering pleural effusions with hazy bibasilar opacities. No pneumothorax. IMPRESSION: 1. Endotracheal tube terminates 2.4 cm above the carina. 2. No central venous lines are seen within the field of view. No pneumothorax. 3. Bilateral layering pleural effusions with hazy bibasilar opacities. Electronically Signed   By: Duanne Guess D.O.   On: 02/10/2024 15:19   DG Chest Port 1 View Result Date: 02/10/2024 CLINICAL DATA:  Acute respiratory failure with hypoxia. EXAM: PORTABLE CHEST 1 VIEW COMPARISON:  Chest x-ray from yesterday. FINDINGS: Unchanged endotracheal tube with the tip 1.7 cm above the carina. Interval removal of the enteric tube. Stable cardiomediastinal silhouette. Right lower lobe consolidation seems mildly improved. Unchanged left lower lobe consolidation and small bilateral pleural effusions. No pneumothorax. No acute osseous abnormality. IMPRESSION: 1. Multifocal pneumonia, mildly improved in the right  lower lobe. 2. Unchanged small bilateral pleural effusions. Electronically Signed   By: Obie Dredge M.D.   On: 02/10/2024 09:54   MR BRAIN W WO CONTRAST Result Date: 02/09/2024 CLINICAL DATA:  Mental status change of unknown cause EXAM: MRI HEAD WITHOUT AND WITH CONTRAST TECHNIQUE: Multiplanar, multiecho pulse sequences of the brain and surrounding structures were obtained without and with intravenous contrast. CONTRAST:  6mL GADAVIST GADOBUTROL 1 MMOL/ML IV SOLN COMPARISON:  Head CT same day FINDINGS: Brain: Diffusion imaging does not show any acute or subacute infarction or other cause of restricted diffusion. Patchy areas of abnormal edema seen in both cerebellar hemispheres. Subcortical edema present within both cerebral hemispheres, particularly posterior. 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 Numerous foci of hemosiderin deposition scattered throughout the brain, some present in areas not affected  by the abnormal T2 signal. Therefore, I think these are probably chronic and not sequela of the recent head injury. There is probably a background pattern of mild chronic small-vessel ischemic change of the white matter. No sign of mass, hydrocephalus or extra-axial collection. There is brain atrophy, with some parietal predominance. Vascular: Major vessels at the base of the brain show flow. Skull and upper cervical spine: Negative Sinuses/Orbits: Sinuses are clear. Patient is intubated. Orbits negative. Other: Left forehead/frontal scalp hematoma. IMPRESSION: 1. Patchy areas of abnormal edema in both cerebellar hemispheres. Subcortical edema within both cerebral hemispheres, particularly posterior. 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 No areas show restricted diffusion to suggest ischemic infarction. 2. Numerous foci of hemosiderin deposition scattered throughout the brain, some present in areas not  affected by the abnormal T2 signal. Therefore, I think these are probably chronic and not sequela of the recent head injury. 3. Brain atrophy, with some parietal predominance. 4. Left forehead/frontal scalp hematoma. Electronically Signed   By: Paulina Fusi M.D.   On: 02/09/2024 23:54   EEG adult Result Date: 02/09/2024 Jefferson Fuel, MD     02/09/2024  7:32 PM Routine EEG Report STEPAHNIE CAMPO is a 63 y.o. female with a history of seizure who is undergoing an EEG to evaluate for seizures. Report: This EEG was acquired with electrodes placed according to the International 10-20 electrode system (including Fp1, Fp2, F3, F4, C3, C4, P3, P4, O1, O2, T3, T4, T5, T6, A1, A2, Fz, Cz, Pz). The following electrodes were missing or displaced: none. The background rhythm was burst suppression with approximately 35% suppression. The bursts consisted of theta activity ~6 Hz with overriding beta frequencies. There was no clear waking rhythm or sleep architecture. There were no definitive epileptiform abnormalities. There were no electrographic seizures. Impression and clinical correlation: This EEG was obtained while sedated on propofol and comatose and is abnormal due to burst suppression pattern indicative of global cerebral dysfunction, medication effect, or both. Definitive epileptiform abnormalities were not seen during this recording. Bing Neighbors, MD [facility] [phone] If 7pm- 7am, please page neurology on call as listed in AMION.   DG Abd Portable 1V Result Date: 02/09/2024 CLINICAL DATA:  Orogastric tube placement. EXAM: PORTABLE ABDOMEN - 1 VIEW COMPARISON:  Chest CT 02/04/2024 FINDINGS: The enteric tube is looped within a large hiatal hernia. Tip is directed distally in the region of the diaphragmatic hiatus. IMPRESSION: Enteric tube looped within a large hiatal hernia. Tip is directed distally in the region of the diaphragmatic hiatus. Electronically Signed   By: Narda Rutherford M.D.    On: 02/09/2024 16:01   CT HEAD WO CONTRAST ( ) Result Date: 02/09/2024 CLINICAL DATA:  Provided history: Head trauma, abnormal mental status. EXAM: CT HEAD WITHOUT CONTRAST TECHNIQUE: Contiguous axial images were obtained from the base of the skull through the vertex without intravenous contrast. RADIATION DOSE REDUCTION: This exam was performed according to the departmental dose-optimization program which includes automated exposure control, adjustment of the mA and/or kV according to patient size and/or use of iterative reconstruction technique. COMPARISON:  Head CT 02/01/2024. FINDINGS: Brain: Mild generalized parenchymal atrophy. Foci of abnormal cortical/subcortical hypodensity within the left parietal and bilateral occipital lobes, new from the prior head CT of 02/01/2024. There is no acute intracranial hemorrhage. No extra-axial fluid collection. No evidence of an intracranial mass. No midline shift. Vascular: No hyperdense vessel. Atherosclerotic calcifications. Skull: No calvarial fracture or aggressive osseous lesion. Sinuses/Orbits: No mass  or acute finding within the imaged orbits. No significant paranasal sinus disease at the imaged levels. Other: Persistent left anterior scalp/forehead soft tissue swelling. Impression #1 will be called to the ordering clinician or representative by the Radiologist Assistant, and communication documented in the PACS or [employer]. IMPRESSION: 1. Foci of abnormal cortical/subcortical hypodensity within the left parietal and bilateral occipital lobes, new from the prior head CT of 02/01/2024. 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 A brain MRI is recommended for further evaluation. 2. Persistent left anterior scalp/forehead soft tissue swelling. 3. Mild generalized parenchymal atrophy. Electronically Signed   By: Jackey Loge D.O.   On: 02/09/2024  13:44   DG Chest Port 1 View Result Date: 02/09/2024 CLINICAL DATA:  Intubation. EXAM: PORTABLE CHEST 1 VIEW COMPARISON:  Chest radiograph dated 02/07/2024. FINDINGS: Endotracheal tube with tip approximately 1.5 cm above the carina. Recommend retraction by 3 cm. Enteric tube makes a loop in the large hiatal hernia with tip in the region of the gastroesophageal junction. No significant interval change in bilateral mid to lower lung field airspace opacities and small bilateral pleural effusions. No pneumothorax. Stable cardiac silhouette no acute osseous pathology. IMPRESSION: 1. Endotracheal tube with tip approximately 1.5 cm above the carina. Recommend retraction by 3 cm. 2. Enteric tube makes a loop in the large hiatal hernia with tip in the region of the gastroesophageal junction. 3. No significant interval change in bilateral airspace opacities and small bilateral pleural effusions. Electronically Signed   By: Elgie Collard M.D.   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  Started valtrex adjusted to crcl for 5-7 days Watch closely for sz, because of AKI  I had signed off on 2/11 But tback on case on 2/12 on the case as she had seizures and 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

## 2024-02-10 NOTE — Procedures (Signed)
Central Venous Catheter Insertion Procedure Note  Michelle Barnett  [id number]  03/07/1961  Date:02/10/24  Time:12:53 PM   Provider Performing:Ellizabeth Dacruz D Elvina Sidle   Procedure: Insertion of Non-tunneled Central Venous Catheter(36556) with US guidance (11914)   Indication(s) Medication administration and Difficult access  Consent Unable to obtain consent due to emergent nature of procedure.  Anesthesia Topical only with 1% lidocaine   Timeout Verified patient identification, verified procedure, site/side was marked, verified correct patient position, special equipment/implants available, medications/allergies/relevant history reviewed, required imaging and test results available.  Sterile Technique Maximal sterile technique including full sterile barrier drape, hand hygiene, sterile gown, sterile gloves, mask, hair covering, sterile ultrasound probe cover (if used).  Procedure Description Area of catheter insertion was cleaned with chlorhexidine and draped in sterile fashion.  With real-time ultrasound guidance a central venous catheter was placed into the left femoral vein. Nonpulsatile blood flow and easy flushing noted in all ports.  The catheter was sutured in place and sterile dressing applied.  Complications/Tolerance None; patient tolerated the procedure well. Chest X-ray is ordered to verify placement for internal jugular or subclavian cannulation.   Chest x-ray is not ordered for femoral cannulation.  EBL Minimal  Specimen(s) None   Line inserted to the 20 cm mark. BIOPATCH applied to the insertion site.   Harlon Ditty, AGACNP-BC [facility] Prefer epic messenger for cross cover needs If after hours, please call E-link

## 2024-02-10 NOTE — Progress Notes (Signed)
NAME:  Michelle Barnett, MRN:  [id number], DOB:  May 08, 1961, LOS: 9 ADMISSION DATE:  02/01/2024, CONSULTATION DATE:  02/09/2024 REFERRING MD:  Dr. Meriam Sprague, CHIEF COMPLAINT:  Generalized tonic clonic seizure   Brief Pt Description / Synopsis:  63 y.o. female admitted following a fall at home with Acute Metabolic Encephalopathy, Acute Hypoxic Respiratory Failure in the setting of Community Acquired Pneumonia and Acute Asthma Exacerbation, A.fib with RVR, Acute Kidney Injury, and HSV 1 infection.  Course complicated on 02/09/24 by Generalized Tonic Clonic Seizure with severe hypoxia requiring intubation and mechanical ventilation for airway protection.  History of Present Illness:  Michelle Barnett is 63 y.o. female with hypertension, hyperlipidemia, iron deficiency anemia, who presented to the ED on 02/01/2024 with acute hypoxia.  Pt is currently intubated, sedated, and unable to contribute to history and no family is currently available, therefore history is obtained per chart review.  Per Hospitalist progress notes, "patient was previously in her PCP office where she was noted to be 66% on room air, this improved on 3 L O2 but could not get higher than 88%.  EMS was called and brought to the patient to the hospital.  In the ED she was found to be septic with a temperature of 100.7, respirations 20, tachycardic to 113 and hypoxic.  Labs were mostly unrevealing.  Patient underwent CT head, CT cervical spine, CT maxillofacial which revealed soft tissue swelling of the periorbital soft tissues of the left and along the left frontal scalp.  She received DuoNebs, azithromycin, ceftriaxone.  Hospital stay has been prolonged by ongoing hypoxia, inability to wean oxygen, and persistent delirium.  Patient has now completed her course of antibiotics.  We have initiated steroid taper, but she remains on 6 L nasal cannula.   Patient's stay was also complicated by A-fib RVR necessitating cardiology consult, amiodarone drip  and heparin drip which have since been transitioned to Eliquis and amiodarone p.o.  A-fib RVR likely provoked by hypoxia and breathing treatments. Patient stay is also been complicated by lip lesions which were initially thought to be impetigo but later resulted positive for HSV.  She has had some improvement with mupirocin and was initiated on valacyclovir."  Please see "Significant Hospital Events" section below for full detailed hospital course.    Pertinent  Medical History   Past Medical History:  Diagnosis Date   Asthma    Bicuspid aortic valve 09/20/2016   a. echo 09/19/16: EF 55-60%, GR1DD, possible bicuspid aortic valve without evidence of AS   Bronchitis    Coronary artery disease, non-occlusive    a. cath 09/21/16: ostLM to LM 20%, no evidence of aortic stenosis   Demand ischemia (HCC) 09/20/2016   Heart murmur    Hiatal hernia    History of blood transfusion    Hypertension    Persistent cough for 3 weeks or longer 05/19/2022   Ulcer     Micro Data:  2/4: COVID/Flu/RSV PCR>> negative 2/4: Blood cultures x2>> negative 2/5: HIV Screen>> negative 2/5: RPR>> negative 2/7: Mycoplasma pneumo IgM: < 770 2/7: Varicella-Zoster PCR>>negative 2/7: Wound>> Staphylococcus Epidermidis 2/8: Legionella urinary antigen>> negative 2/9: Respiratory viral panel>> negative  Antimicrobials:   Anti-infectives (From admission, onward)    Start     Dose/Rate Route Frequency Ordered Stop   02/10/24 0000  acyclovir (ZOVIRAX) 575 mg in dextrose 5 % 100 mL IVPB        10 mg/kg  57.6 kg 111.5 mL/hr over 60 Minutes Intravenous Every 12 hours  02/09/24 1640     02/09/24 1500  acyclovir (ZOVIRAX) 500 mg in dextrose 5 % 100 mL IVPB  Status:  Discontinued        500 mg 110 mL/hr over 60 Minutes Intravenous Every 12 hours 02/09/24 1326 02/09/24 1640   02/08/24 1130  valACYclovir (VALTREX) tablet 1,000 mg  Status:  Discontinued        1,000 mg Oral 2 times daily 02/08/24 1102 02/09/24 1304    02/07/24 2200  doxycycline (VIBRA-TABS) tablet 100 mg  Status:  Discontinued        100 mg Oral Every 12 hours 02/07/24 1027 02/08/24 1101   02/06/24 1515  valACYclovir (VALTREX) tablet 1,000 mg  Status:  Discontinued        1,000 mg Oral Daily 02/06/24 1424 02/08/24 1102   02/03/24 1000  doxycycline (VIBRAMYCIN) 100 mg in sodium chloride 0.9 % 250 mL IVPB  Status:  Discontinued        100 mg 125 mL/hr over 120 Minutes Intravenous Every 12 hours 02/02/24 1328 02/07/24 1026   02/02/24 1100  azithromycin (ZITHROMAX) 500 mg in sodium chloride 0.9 % 250 mL IVPB  Status:  Discontinued        500 mg 250 mL/hr over 60 Minutes Intravenous Every 24 hours 02/01/24 1402 02/02/24 1328   02/02/24 1000  cefTRIAXone (ROCEPHIN) 2 g in sodium chloride 0.9 % 100 mL IVPB        2 g 200 mL/hr over 30 Minutes Intravenous Every 24 hours 02/01/24 1402 02/05/24 1832   02/01/24 1230  cefTRIAXone (ROCEPHIN) 2 g in sodium chloride 0.9 % 100 mL IVPB        2 g 200 mL/hr over 30 Minutes Intravenous  Once 02/01/24 1218 02/01/24 1309   02/01/24 1230  azithromycin (ZITHROMAX) 500 mg in sodium chloride 0.9 % 250 mL IVPB        500 mg 250 mL/hr over 60 Minutes Intravenous  Once 02/01/24 1218 02/01/24 1421       Significant Hospital Events: Including procedures, antibiotic start and stop dates in addition to other pertinent events   2/4: Admitted by [facility]. 2/12: Rapid response called due to witnessed Generalized Tonic Clonic seizure, self resolved.  Never lost pulse, however severely hypoxic, required emergent intubation and mechanical ventilation for airway protection. 2/13: No significant events noted overnight.  MRI Brain last night concerning for PRESS.  Lab unable to obtain blood draws, will place central line for access.  On minimal vent support, requiring minimal Levophed.  Will perform WUQ and SBT as able.  Interim History / Subjective:  As outlined above under "Significant Hospital Events" section  Objective    Blood pressure 114/70, pulse (!) 56, temperature 97.8 F (36.6 C), temperature source Axillary, resp. rate 14, height 4\' 9"  (1.448 m), weight 57.6 kg, SpO2 100%.    Vent Mode: PRVC FiO2 (%):  [40 %] 40 % Set Rate:  [15 bmp] 15 bmp Vt Set:  [400 mL] 400 mL PEEP:  [5 cmH20] 5 cmH20 Plateau Pressure:  [14 cmH20-17 cmH20] 16 cmH20   Intake/Output Summary (Last 24 hours) at 02/10/2024 0809 Last data filed at 02/10/2024 0400 Gross per 24 hour  Intake 1759.71 ml  Output 1125 ml  Net 634.71 ml   Filed Weights   02/01/24 1212  Weight: 57.6 kg    Examination: General: Critically ill-appearing female, laying in bed, intubated and sedated, in NAD HENT: Atraumatic, bruising noted to left forehead and periorbital region, along with HSV 1  lesions to the mouth which are with dried blood Lungs: Coarse breath sounds throughout, even, nonlabored, synchronous with vent Cardiovascular: Regular rate and rhythm, S1-S2, no murmurs, rubs, gallops Abdomen: Soft, nontender, nondistended, no guarding rebound tenderness, bowel sounds positive x 4 Extremities: Normal bulk and tone, no deformities, no edema Neuro: Sedated, withdraws from pain but currently not following commands, pupils PERRLA GU: External catheter in place  Resolved Hospital Problem list     Assessment & Plan:   #Generalized Tonic Clonic Seizure #Acute Metabolic Encephalopathy #Concern for possible Encephalitis given known HSV 1 infection #Concern for PRESS #Sedation needs in setting of mechanical ventilation MRI Brain consistent with PRESS -Treatment of metabolic derangements as outlined below -Maintain a RASS goal of 0 to -1 -Fentanyl and Propofol as needed to maintain RASS goal -Avoid sedating medications as able -Daily wake up assessment -Continue Keppra 500 mg BID dosing -Neurology following, appreciate input ~ dicussed with Dr. Selina Cooley, will continue empiric Acyclovir and stop Valtrex  -Will need LP, but will have to wait 48  hrs as she has been on Eliquis (stopped 2/12) ~ tentative plan for 2/14  #Acute Hypoxic Respiratory Failure in the setting of CAP, pulmonary edema, and Asthma Exacerbation #Intubated for airway protection -Full vent support, implement lung protective strategies -Plateau pressures less than 30 cm H20 -Wean FiO2 & PEEP as tolerated to maintain O2 sats >92% -Follow intermittent Chest X-ray & ABG as needed -Spontaneous Breathing Trials when respiratory parameters met and mental status permits -Implement VAP Bundle -Bronchodilators -IV steroids, will taper -Completed course of ABX as above  #Hypotension: suspect sedation related #Atrial Fibrillation with RVR ~ currently in NSR PMHx: HTN Echocardiogram 02/02/24: LVEF 55-60%. Normal diastolic parameters. RV systolic function is normal.  RV is moderately enlarged, moderate MR, moderate TR, moderate AS -Continuous cardiac monitoring -Maintain MAP >65 -IV fluids -Vasopressors as needed to maintain MAP goal -Lactic acid is normalized -Diuresis as BP and renal function permits ~ holding due to shock and contraction alkalosis -Hold home Lisinopril and hydrochlorothiazide -Cardiology was following and has since signed off ~ recommends follow up in clinic in 2 weeks post discharge -Continue Amiodarone 200 mg BID -Hold Eliquis for now given need for LP on 2/14  #Concern for Encephalitis #HSV-1 Infection #Community Acquired Pneumonia ~ TREATED -Monitor fever curve -Trend WBC's & Procalcitonin -Follow cultures as above -ID following, appreciate input ~ Continue empiric Acyclovir pending cultures & sensitivities -Needs LP, will have to wait 48 hrs as pt on Eliquis till 2/12  #Acute Kidney Injury ~ IMPROVED #Hypernatremia ~ RESOLVED #Metabolic Alkalosis, suspect contraction in setting of diuresis ~ IMPROVING -Monitor I&O's / urinary output -Follow BMP -Ensure adequate renal perfusion -Avoid nephrotoxic agents as able -Replace electrolytes as  indicated ~ Pharmacy following for assistance with electrolyte replacement -Discontinue D5W  #Chronic Hiatal Hernia -Contributing to inability to place NG/OG tube -May benefit from General Surgery consultation outpatient      Best Practice (right click and "Reselect all SmartList Selections" daily)   Diet/type: NPO (unable to place OG due to hiatal hernia, will likely have to consult IR for Dobhoff placement) DVT prophylaxis: prophylactic heparin  GI prophylaxis: PPI Lines: N/A Foley:  Yes, and it is still needed Code Status:  full code Last date of multidisciplinary goals of care discussion [2/13]  2/13: Will update pt's family when they arrive at bedside.  Labs   CBC: Recent Labs  Lab 02/05/24 0432 02/06/24 0424 02/07/24 0505 02/08/24 0250 02/09/24 0438  WBC 6.1 6.7 7.2  6.3 5.8  NEUTROABS 5.1 5.7 5.8 5.4 4.6  HGB 10.8* 10.7* 10.6* 11.4* 11.9*  HCT 33.8* 34.2* 34.3* 36.7 38.1  MCV 96.0 99.1 98.8 100.0 98.7  PLT 159 194 217 219 231    Basic Metabolic Panel: Recent Labs  Lab 02/05/24 0432 02/06/24 0424 02/07/24 0505 02/08/24 0250 02/09/24 0438  NA 141 146* 149* 149* 154*  K 3.0* 3.5 3.9 3.7 3.5  CL 101 100 105 103 102  CO2 31 34* 37* 40* 40*  GLUCOSE 135* 160* 107* 150* 134*  BUN 50* 51* 52* 54* 47*  CREATININE 1.41* 1.52* 1.40* 1.26* 1.04*  CALCIUM 8.3* 8.9 9.3 8.9 9.3  MG 2.4 2.2 2.2 2.1 2.1  PHOS 2.6 3.7 1.8* 1.9* 1.8*   GFR: Estimated Creatinine Clearance: 40.4 mL/min (A) (by C-G formula based on SCr of 1.04 mg/dL (H)). Recent Labs  Lab 02/04/24 0555 02/05/24 0432 02/06/24 0424 02/07/24 0505 02/08/24 0250 02/09/24 0438  PROCALCITON 0.40  --   --   --   --   --   WBC 6.5   < > 6.7 7.2 6.3 5.8   < > = values in this interval not displayed.    Liver Function Tests: Recent Labs  Lab 02/05/24 0432 02/06/24 0424 02/07/24 0505 02/08/24 0250 02/09/24 0438  AST 46* 36 36 60* 77*  ALT 23 21 25  41 64*  ALKPHOS 28* 29* 28* 31* 31*  BILITOT  0.8 0.3 0.3 0.7 0.7  PROT 5.6* 5.6* 5.5* 5.3* 5.6*  ALBUMIN 2.5* 2.5* 2.5* 2.6* 2.7*   No results for input(s): "LIPASE", "AMYLASE" in the last 168 hours. No results for input(s): "AMMONIA" in the last 168 hours.  ABG    Component Value Date/Time   PHART 7.58 (H) 02/10/2024 0355   PCO2ART 43 02/10/2024 0355   PO2ART 144 (H) 02/10/2024 0355   HCO3 40.3 (H) 02/10/2024 0355   O2SAT 99.3 02/10/2024 0355     Coagulation Profile: No results for input(s): "INR", "PROTIME" in the last 168 hours.  Cardiac Enzymes: No results for input(s): "CKTOTAL", "CKMB", "CKMBINDEX", "TROPONINI" in the last 168 hours.  HbA1C: Hgb A1c MFr Bld  Date/Time Value Ref Range Status  05/19/2022 08:06 AM 5.8 4.6 - 6.5 % Final    Comment:    Glycemic Control Guidelines for People with Diabetes:Non Diabetic:  <6%Goal of Therapy: <7%Additional Action Suggested:  >8%   05/21/2021 12:57 PM 5.6 4.6 - 6.5 % Final    Comment:    Glycemic Control Guidelines for People with Diabetes:Non Diabetic:  <6%Goal of Therapy: <7%Additional Action Suggested:  >8%     CBG: Recent Labs  Lab 02/04/24 2334 02/09/24 1936 02/09/24 2308 02/10/24 0314 02/10/24 0745  GLUCAP 142* 154* 153* 193* 152*    Review of Systems:   Unable to assess due to AMS/intubation/sedation   Past Medical History:  She,  has a past medical history of Asthma, Bicuspid aortic valve (09/20/2016), Bronchitis, Coronary artery disease, non-occlusive, Demand ischemia (HCC) (09/20/2016), Heart murmur, Hiatal hernia, History of blood transfusion, Hypertension, Persistent cough for 3 weeks or longer (05/19/2022), and Ulcer.   Surgical History:   Past Surgical History:  Procedure Laterality Date   abdominal tumor     ABLATION     CARDIAC CATHETERIZATION N/A 09/21/2016   Procedure: Left Heart Cath and Coronary Angiography;  Surgeon: Iran Ouch, MD;  Location: [facility] INVASIVE CV LAB;  Service: Cardiovascular;  Laterality: N/A;   COLONOSCOPY N/A  08/14/2016   Procedure: COLONOSCOPY;  Surgeon: Sherilyn Cooter  Lucky Cowboy, MD;  Location: [facility] ENDOSCOPY;  Service: Endoscopy;  Laterality: N/A;   ESOPHAGOGASTRODUODENOSCOPY N/A 08/13/2016   Procedure: ESOPHAGOGASTRODUODENOSCOPY (EGD);  Surgeon: Sherrilyn Rist, MD;  Location: [facility] ENDOSCOPY;  Service: Gastroenterology;  Laterality: N/A;   TOOTH EXTRACTION       Social History:   reports that she has never smoked. She has never used smokeless tobacco. She reports that she does not drink alcohol and does not use drugs.   Family History:  Her family history includes Breast cancer in her maternal aunt; Heart disease in her father; Hypertension in her brother, father, and mother; Stroke in her mother.   Allergies Allergies  Allergen Reactions   Amlodipine Rash   Aspirin Anaphylaxis   Dairy Aid [Tilactase] Swelling and Other (See Comments)    Any dairy products   Benadryl [Diphenhydramine] Palpitations   Penicillins Other (See Comments)    Reaction: Unknown     Home Medications  Prior to Admission medications   Medication Sig Start Date End Date Taking? Authorizing Provider  albuterol (VENTOLIN HFA) 108 (90 Base) MCG/ACT inhaler INHALE 2 PUFFS BY MOUTH EVERY 6 HOURS AS NEEDED FOR WHEEZING FOR SHORTNESS OF BREATH 11/28/23  Yes Doreene Nest, NP  Ascorbic Acid (VITAMIN C) 1000 MG tablet Take 1,000 mg by mouth 2 (two) times daily.    Yes [provider]  atorvastatin (LIPITOR) 40 MG tablet TAKE 1 TABLET BY MOUTH ONCE DAILY FOR CHOLESTEROL 05/30/23  Yes Doreene Nest, NP  ferrous sulfate 325 (65 FE) MG tablet Take 650 mg by mouth 2 (two) times daily.   Yes [provider]  Fluticasone Furoate (ARNUITY ELLIPTA) 100 MCG/ACT AEPB Inhale 1 puff into the lungs daily. 05/21/23  Yes Doreene Nest, NP  lisinopril-hydrochlorothiazide (ZESTORETIC) 20-25 MG tablet Take 1 tablet by mouth once daily for blood pressure 11/28/23  Yes Doreene Nest, NP  magnesium oxide (MAG-OX) 400 MG  tablet Take 400 mg by mouth daily.   Yes [provider]  Multiple Vitamins-Calcium (ONE-A-DAY WOMENS PO) Take 1 tablet by mouth daily.   Yes [provider]  Omega-3 Fatty Acids (FISH OIL) 1200 MG CAPS Take 1 capsule by mouth daily.    Yes [provider]  vitamin B-12 (CYANOCOBALAMIN) 1000 MCG tablet Take 1,000 mcg by mouth daily.   Yes [provider]  benzonatate (TESSALON) 200 MG capsule Take 1 capsule (200 mg total) by mouth 3 (three) times daily as needed for cough. Patient not taking: Reported on 02/01/2024 09/24/23   Doreene Nest, NP     Critical care time: 40 minutes     Harlon Ditty, AGACNP-BC [facility] Prefer epic messenger for cross cover needs If after hours, please call E-link

## 2024-02-10 NOTE — Consult Note (Signed)
PHARMACY CONSULT NOTE - ELECTROLYTES  Pharmacy Consult for Electrolyte Monitoring and Replacement   Recent Labs: Potassium (mmol/L)  Date Value  02/10/2024 3.0 (L)  12/17/2014 3.6   Magnesium (mg/dL)  Date Value  45/40/9811 1.7  11/01/2013 2.6 (H)   Calcium (mg/dL)  Date Value  91/47/8295 8.2 (L)   Calcium, Total (mg/dL)  Date Value  62/13/0865 9.1   Albumin (g/dL)  Date Value  78/46/9629 2.3 (L)  04/27/2013 3.1 (L)   Phosphorus (mg/dL)  Date Value  52/84/1324 2.5   Sodium (mmol/L)  Date Value  02/10/2024 143  09/28/2016 143  12/17/2014 140   Height: 4\' 9"  (144.8 cm) Weight: 57.6 kg (127 lb) IBW/kg (Calculated) : 38.6 Estimated Creatinine Clearance: 42 mL/min (by C-G formula based on SCr of 1 mg/dL).  Assessment  Michelle Barnett is a 63 y.o. female presenting with respiratory failure. PMH significant for hypertension, hyperlipidemia, iron deficiency anemia . Pharmacy has been consulted to monitor and replace electrolytes.  Diet: NPO, intubated. OG tube MIVF: D5 @ 157mL/hr  (hydration while on IV acyclovir) Pertinent medications: N/A  Goal of Therapy: Electrolytes within normal limits  Plan:  Replace K with 30 mEq IV KCl  No other replacement needed at this time Re-check BMP, Mg and Phos with AM labs  Thank you for allowing pharmacy to be a part of this patient's care.  Effie Shy, PharmD Pharmacy Resident  02/10/2024 2:37 PM

## 2024-02-11 DIAGNOSIS — J9602 Acute respiratory failure with hypercapnia: Secondary | ICD-10-CM | POA: Diagnosis not present

## 2024-02-11 DIAGNOSIS — G9340 Encephalopathy, unspecified: Secondary | ICD-10-CM | POA: Diagnosis not present

## 2024-02-11 DIAGNOSIS — J9601 Acute respiratory failure with hypoxia: Secondary | ICD-10-CM | POA: Diagnosis not present

## 2024-02-11 DIAGNOSIS — J189 Pneumonia, unspecified organism: Secondary | ICD-10-CM | POA: Diagnosis not present

## 2024-02-11 LAB — CSF CELL COUNT WITH DIFFERENTIAL
Eosinophils, CSF: 0 %
Lymphs, CSF: 66 %
Monocyte-Macrophage-Spinal Fluid: 30 %
RBC Count, CSF: 37 /mm3 — ABNORMAL HIGH (ref 0–3)
RBC Count, CSF: 29 /mm3 — ABNORMAL HIGH (ref 0–3)
Segmented Neutrophils-CSF: 4 %
Tube #: 3
Tube #: 1
WBC, CSF: 2 /mm3 (ref 0–5)
WBC, CSF: 2 /mm3 (ref 0–5)

## 2024-02-11 LAB — COMPREHENSIVE METABOLIC PANEL
ALT: 65 U/L — ABNORMAL HIGH (ref 0–44)
AST: 54 U/L — ABNORMAL HIGH (ref 15–41)
Albumin: 2.5 g/dL — ABNORMAL LOW (ref 3.5–5.0)
Alkaline Phosphatase: 34 U/L — ABNORMAL LOW (ref 38–126)
Anion gap: 10 (ref 5–15)
BUN: 35 mg/dL — ABNORMAL HIGH (ref 8–23)
CO2: 36 mmol/L — ABNORMAL HIGH (ref 22–32)
Calcium: 8.3 mg/dL — ABNORMAL LOW (ref 8.9–10.3)
Chloride: 95 mmol/L — ABNORMAL LOW (ref 98–111)
Creatinine, Ser: 1 mg/dL (ref 0.44–1.00)
GFR, Estimated: >60 mL/min (ref >60)
Glucose, Bld: 91 mg/dL (ref 70–99)
Potassium: 3.7 mmol/L (ref 3.5–5.1)
Sodium: 141 mmol/L (ref 135–145)
Total Bilirubin: 0.9 mg/dL (ref 0.0–1.2)
Total Protein: 5 g/dL — ABNORMAL LOW (ref 6.5–8.1)

## 2024-02-11 LAB — MENINGITIS/ENCEPHALITIS PANEL (CSF)

## 2024-02-11 LAB — CBC WITH DIFFERENTIAL/PLATELET
Abs Immature Granulocytes: 0.13 10*3/uL — ABNORMAL HIGH (ref 0.00–0.07)
Basophils Absolute: 0 10*3/uL (ref 0.0–0.1)
Basophils Relative: 0 %
Eosinophils Absolute: 0 10*3/uL (ref 0.0–0.5)
Eosinophils Relative: 0 %
HCT: 31.3 % — ABNORMAL LOW (ref 36.0–46.0)
Hemoglobin: 9.9 g/dL — ABNORMAL LOW (ref 12.0–15.0)
Immature Granulocytes: 1 %
Lymphocytes Relative: 9 %
Lymphs Abs: 1 10*3/uL (ref 0.7–4.0)
MCH: 30.3 pg (ref 26.0–34.0)
MCHC: 31.6 g/dL (ref 30.0–36.0)
MCV: 95.7 fL (ref 80.0–100.0)
Monocytes Absolute: 1.3 10*3/uL — ABNORMAL HIGH (ref 0.1–1.0)
Monocytes Relative: 11 %
Neutro Abs: 8.8 10*3/uL — ABNORMAL HIGH (ref 1.7–7.7)
Neutrophils Relative %: 79 %
Platelets: 204 10*3/uL (ref 150–400)
RBC: 3.27 MIL/uL — ABNORMAL LOW (ref 3.87–5.11)
RDW: 14.3 % (ref 11.5–15.5)
WBC: 11.2 10*3/uL — ABNORMAL HIGH (ref 4.0–10.5)
nRBC: 0 % (ref 0.0–0.2)

## 2024-02-11 LAB — GLUCOSE, CAPILLARY
Glucose-Capillary: 71 mg/dL (ref 70–99)
Glucose-Capillary: 88 mg/dL (ref 70–99)
Glucose-Capillary: 92 mg/dL (ref 70–99)
Glucose-Capillary: 92 mg/dL (ref 70–99)
Glucose-Capillary: 98 mg/dL (ref 70–99)
Glucose-Capillary: 99 mg/dL (ref 70–99)

## 2024-02-11 LAB — PHOSPHORUS: Phosphorus: 3.1 mg/dL (ref 2.5–4.6)

## 2024-02-11 LAB — PROTEIN AND GLUCOSE, CSF
Glucose, CSF: 68 mg/dL (ref 40–70)
Total  Protein, CSF: 28 mg/dL (ref 15–45)

## 2024-02-11 LAB — MAGNESIUM: Magnesium: 1.6 mg/dL — ABNORMAL LOW (ref 1.7–2.4)

## 2024-02-11 MED ORDER — MAGNESIUM SULFATE 2 GM/50ML IV SOLN
2.0000 g | Freq: Once | INTRAVENOUS | Status: AC
  Administered 2024-02-11: 2 g via INTRAVENOUS
  Filled 2024-02-11: qty 50

## 2024-02-11 MED ORDER — LACTATED RINGERS IV BOLUS
500.0000 mL | Freq: Once | INTRAVENOUS | Status: AC
  Administered 2024-02-11: 500 mL via INTRAVENOUS

## 2024-02-11 MED ORDER — SODIUM CHLORIDE 0.9 % IV SOLN: INTRAVENOUS | Status: DC

## 2024-02-11 NOTE — Progress Notes (Signed)
NAME:  Michelle Barnett, MRN:  [id number], DOB:  Nov 04, 1961, LOS: 10 ADMISSION DATE:  02/01/2024, CONSULTATION DATE:  02/09/2024 REFERRING MD:  Dr. Meriam Sprague, CHIEF COMPLAINT:  Generalized tonic clonic seizure   Brief Pt Description / Synopsis:  63 y.o. female admitted following a fall at home with Acute Metabolic Encephalopathy, Acute Hypoxic Respiratory Failure in the setting of Community Acquired Pneumonia and Acute Asthma Exacerbation, A.fib with RVR, Acute Kidney Injury, and HSV 1 infection.  Course complicated on 02/09/24 by Generalized Tonic Clonic Seizure with severe hypoxia requiring intubation and mechanical ventilation for airway protection.  History of Present Illness:  Michelle Barnett is 63 y.o. female with hypertension, hyperlipidemia, iron deficiency anemia, who presented to the ED on 02/01/2024 with acute hypoxia.  Pt is currently intubated, sedated, and unable to contribute to history and no family is currently available, therefore history is obtained per chart review.  Per Hospitalist progress notes, "patient was previously in her PCP office where she was noted to be 66% on room air, this improved on 3 L O2 but could not get higher than 88%.  EMS was called and brought to the patient to the hospital.  In the ED she was found to be septic with a temperature of 100.7, respirations 20, tachycardic to 113 and hypoxic.  Labs were mostly unrevealing.  Patient underwent CT head, CT cervical spine, CT maxillofacial which revealed soft tissue swelling of the periorbital soft tissues of the left and along the left frontal scalp.  She received DuoNebs, azithromycin, ceftriaxone.  Hospital stay has been prolonged by ongoing hypoxia, inability to wean oxygen, and persistent delirium.  Patient has now completed her course of antibiotics.  We have initiated steroid taper, but she remains on 6 L nasal cannula.   Patient's stay was also complicated by A-fib RVR necessitating cardiology consult, amiodarone drip  and heparin drip which have since been transitioned to Eliquis and amiodarone p.o.  A-fib RVR likely provoked by hypoxia and breathing treatments. Patient stay is also been complicated by lip lesions which were initially thought to be impetigo but later resulted positive for HSV.  She has had some improvement with mupirocin and was initiated on valacyclovir."  Please see "Significant Hospital Events" section below for full detailed hospital course.    Pertinent  Medical History   Past Medical History:  Diagnosis Date   Asthma    Bicuspid aortic valve 09/20/2016   a. echo 09/19/16: EF 55-60%, GR1DD, possible bicuspid aortic valve without evidence of AS   Bronchitis    Coronary artery disease, non-occlusive    a. cath 09/21/16: ostLM to LM 20%, no evidence of aortic stenosis   Demand ischemia (HCC) 09/20/2016   Heart murmur    Hiatal hernia    History of blood transfusion    Hypertension    Persistent cough for 3 weeks or longer 05/19/2022   Ulcer     Micro Data:  2/4: COVID/Flu/RSV PCR>> negative 2/4: Blood cultures x2>> negative 2/5: HIV Screen>> negative 2/5: RPR>> negative 2/7: Mycoplasma pneumo IgM: < 770 2/7: Varicella-Zoster PCR>>negative 2/7: Wound>> Staphylococcus Epidermidis 2/8: Legionella urinary antigen>> negative 2/9: Respiratory viral panel>> negative  Antimicrobials:   Anti-infectives (From admission, onward)    Start     Dose/Rate Route Frequency Ordered Stop   02/10/24 0000  acyclovir (ZOVIRAX) 575 mg in dextrose 5 % 100 mL IVPB        10 mg/kg  57.6 kg 111.5 mL/hr over 60 Minutes Intravenous Every 12 hours  02/09/24 1640     02/09/24 1500  acyclovir (ZOVIRAX) 500 mg in dextrose 5 % 100 mL IVPB  Status:  Discontinued        500 mg 110 mL/hr over 60 Minutes Intravenous Every 12 hours 02/09/24 1326 02/09/24 1640   02/08/24 1130  valACYclovir (VALTREX) tablet 1,000 mg  Status:  Discontinued        1,000 mg Oral 2 times daily 02/08/24 1102 02/09/24 1304    02/07/24 2200  doxycycline (VIBRA-TABS) tablet 100 mg  Status:  Discontinued        100 mg Oral Every 12 hours 02/07/24 1027 02/08/24 1101   02/06/24 1515  valACYclovir (VALTREX) tablet 1,000 mg  Status:  Discontinued        1,000 mg Oral Daily 02/06/24 1424 02/08/24 1102   02/03/24 1000  doxycycline (VIBRAMYCIN) 100 mg in sodium chloride 0.9 % 250 mL IVPB  Status:  Discontinued        100 mg 125 mL/hr over 120 Minutes Intravenous Every 12 hours 02/02/24 1328 02/07/24 1026   02/02/24 1100  azithromycin (ZITHROMAX) 500 mg in sodium chloride 0.9 % 250 mL IVPB  Status:  Discontinued        500 mg 250 mL/hr over 60 Minutes Intravenous Every 24 hours 02/01/24 1402 02/02/24 1328   02/02/24 1000  cefTRIAXone (ROCEPHIN) 2 g in sodium chloride 0.9 % 100 mL IVPB        2 g 200 mL/hr over 30 Minutes Intravenous Every 24 hours 02/01/24 1402 02/05/24 1832   02/01/24 1230  cefTRIAXone (ROCEPHIN) 2 g in sodium chloride 0.9 % 100 mL IVPB        2 g 200 mL/hr over 30 Minutes Intravenous  Once 02/01/24 1218 02/01/24 1309   02/01/24 1230  azithromycin (ZITHROMAX) 500 mg in sodium chloride 0.9 % 250 mL IVPB        500 mg 250 mL/hr over 60 Minutes Intravenous  Once 02/01/24 1218 02/01/24 1421       Significant Hospital Events: Including procedures, antibiotic start and stop dates in addition to other pertinent events   2/4: Admitted by [facility]. 2/12: Rapid response called due to witnessed Generalized Tonic Clonic seizure, self resolved.  Never lost pulse, however severely hypoxic, required emergent intubation and mechanical ventilation for airway protection. 2/13: No significant events noted overnight.  MRI Brain last night concerning for PRESS.  Lab unable to obtain blood draws, will place central line for access.  On minimal vent support, requiring minimal Levophed.  Will perform WUQ and SBT as able. 2/14: No events noted overnight.  Plan for LP today at bedside.  On minimal vent support, will plan for WUA/SBT  following LP.  Interim History / Subjective:  As outlined above under "Significant Hospital Events" section  Objective   Blood pressure 132/70, pulse 76, temperature 98.1 F (36.7 C), temperature source Axillary, resp. rate 16, height 4\' 9"  (1.448 m), weight 57.4 kg, SpO2 98%.    Vent Mode: PRVC FiO2 (%):  [30 %-40 %] 30 % Set Rate:  [12 bmp-15 bmp] 12 bmp Vt Set:  [400 mL] 400 mL PEEP:  [5 cmH20] 5 cmH20 Pressure Support:  [5 cmH20] 5 cmH20 Plateau Pressure:  [15 cmH20-18 cmH20] 16 cmH20   Intake/Output Summary (Last 24 hours) at 02/11/2024 0800 Last data filed at 02/11/2024 0600 Gross per 24 hour  Intake 3025.52 ml  Output 750 ml  Net 2275.52 ml   Filed Weights   02/01/24 1212 02/11/24 0330  Weight: 57.6 kg 57.4 kg    Examination: General: Critically ill-appearing female, laying in bed, intubated and sedated, in NAD HENT: Traumatic, bruising noted to left forehead and periorbital region, along with HSV 1 lesions to the mouth with dried blood Lungs: Coarse breath sounds throughout, even, nonlabored, synchronous with vent Cardiovascular: Regular rate and rhythm, S1-S2, no murmurs, rubs, gallops Abdomen: Soft, nontender, nondistended, no guarding rebound tenderness, bowel sounds positive x 4 Extremities: Normal bulk and tone, no deformities, no edema Neuro: Sedated, withdraws from pain but currently not following commands, pupils PERRLA GU: External catheter in place  Resolved Hospital Problem list     Assessment & Plan:   #Generalized Tonic Clonic Seizure #Acute Metabolic Encephalopathy #Concern for possible Encephalitis given known HSV 1 infection #Concern for PRESS #Sedation needs in setting of mechanical ventilation MRI Brain consistent with PRESS -Treatment of metabolic derangements as outlined below -Maintain a RASS goal of 0 to -1 -Fentanyl and Propofol as needed to maintain RASS goal -Avoid sedating medications as able -Daily wake up assessment -Continue  Keppra 500 mg BID dosing -Neurology following, appreciate input ~ dicussed with Dr. Selina Cooley, will continue empiric Acyclovir and stop Valtrex  -Will need LP, but will have to wait 48 hrs as she has been on Eliquis (stopped 2/12) ~ tentative plan for 2/14  #Acute Hypoxic Respiratory Failure in the setting of CAP, pulmonary edema, and Asthma Exacerbation #Intubated for airway protection -Full vent support, implement lung protective strategies -Plateau pressures less than 30 cm H20 -Wean FiO2 & PEEP as tolerated to maintain O2 sats >92% -Follow intermittent Chest X-ray & ABG as needed -Spontaneous Breathing Trials when respiratory parameters met and mental status permits -Implement VAP Bundle -Bronchodilators -IV steroids, will taper -Completed course of ABX as above  #Hypotension: suspect sedation related #Atrial Fibrillation with RVR ~ currently in NSR PMHx: HTN Echocardiogram 02/02/24: LVEF 55-60%. Normal diastolic parameters. RV systolic function is normal.  RV is moderately enlarged, moderate MR, moderate TR, moderate AS -Continuous cardiac monitoring -Maintain MAP >65 -IV fluids -Vasopressors as needed to maintain MAP goal -Lactic acid is normalized -Diuresis as BP and renal function permits ~ holding due to shock and contraction alkalosis -Hold home Lisinopril and hydrochlorothiazide -Cardiology was following and has since signed off ~ recommends follow up in clinic in 2 weeks post discharge -Continue Amiodarone 200 mg BID -Hold Eliquis for now given need for LP on 2/14  #Concern for Encephalitis #HSV-1 Infection #Community Acquired Pneumonia ~ TREATED -Monitor fever curve -Trend WBC's & Procalcitonin -Follow cultures as above -ID following, appreciate input ~ Continue empiric Acyclovir pending cultures & sensitivities -Needs LP, will have to wait 48 hrs as pt on Eliquis till 2/12 ~ plan for LP at bedside on 2/14  #Acute Kidney Injury ~ IMPROVED #Hypernatremia ~  RESOLVED #Metabolic Alkalosis, suspect contraction in setting of diuresis ~ IMPROVING -Monitor I&O's / urinary output -Follow BMP -Ensure adequate renal perfusion -Avoid nephrotoxic agents as able -Replace electrolytes as indicated ~ Pharmacy following for assistance with electrolyte replacement  #Chronic Hiatal Hernia -Contributing to inability to place NG/OG tube -May benefit from General Surgery consultation outpatient      Best Practice (right click and "Reselect all SmartList Selections" daily)   Diet/type: NPO (unable to place OG due to hiatal hernia, will likely have to consult IR for Dobhoff placement) DVT prophylaxis: prophylactic heparin  GI prophylaxis: PPI Lines: Left femoral CVC, and is still needed Foley:  Yes, and it is still needed Code Status:  full  code Last date of multidisciplinary goals of care discussion [2/14]  2/14: Will update pt's family when they arrive at bedside.  Labs   CBC: Recent Labs  Lab 02/07/24 0505 02/08/24 0250 02/09/24 0438 02/10/24 1300 02/11/24 0404  WBC 7.2 6.3 5.8 7.5 11.2*  NEUTROABS 5.8 5.4 4.6 5.6 8.8*  HGB 10.6* 11.4* 11.9* 9.7* 9.9*  HCT 34.3* 36.7 38.1 29.5* 31.3*  MCV 98.8 100.0 98.7 94.2 95.7  PLT 217 219 231 200 204    Basic Metabolic Panel: Recent Labs  Lab 02/07/24 0505 02/08/24 0250 02/09/24 0438 02/10/24 1300 02/11/24 0404  NA 149* 149* 154* 143 141  K 3.9 3.7 3.5 3.0* 3.7  CL 105 103 102 98 95*  CO2 37* 40* 40* 37* 36*  GLUCOSE 107* 150* 134* 132* 91  BUN 52* 54* 47* 38* 35*  CREATININE 1.40* 1.26* 1.04* 1.00 1.00  CALCIUM 9.3 8.9 9.3 8.2* 8.3*  MG 2.2 2.1 2.1 1.7 1.6*  PHOS 1.8* 1.9* 1.8* 2.5 3.1   GFR: Estimated Creatinine Clearance: 41.9 mL/min (by C-G formula based on SCr of 1 mg/dL). Recent Labs  Lab 02/08/24 0250 02/09/24 0438 02/10/24 1300 02/11/24 0404  WBC 6.3 5.8 7.5 11.2*    Liver Function Tests: Recent Labs  Lab 02/07/24 0505 02/08/24 0250 02/09/24 0438 02/10/24 1300  02/11/24 0404  AST 36 60* 77* 63* 54*  ALT 25 41 64* 66* 65*  ALKPHOS 28* 31* 31* 28* 34*  BILITOT 0.3 0.7 0.7 0.7 0.9  PROT 5.5* 5.3* 5.6* 4.5* 5.0*  ALBUMIN 2.5* 2.6* 2.7* 2.3* 2.5*   No results for input(s): "LIPASE", "AMYLASE" in the last 168 hours. No results for input(s): "AMMONIA" in the last 168 hours.  ABG    Component Value Date/Time   PHART 7.58 (H) 02/10/2024 0355   PCO2ART 43 02/10/2024 0355   PO2ART 144 (H) 02/10/2024 0355   HCO3 40.3 (H) 02/10/2024 0355   O2SAT 99.3 02/10/2024 0355     Coagulation Profile: No results for input(s): "INR", "PROTIME" in the last 168 hours.  Cardiac Enzymes: No results for input(s): "CKTOTAL", "CKMB", "CKMBINDEX", "TROPONINI" in the last 168 hours.  HbA1C: Hgb A1c MFr Bld  Date/Time Value Ref Range Status  05/19/2022 08:06 AM 5.8 4.6 - 6.5 % Final    Comment:    Glycemic Control Guidelines for People with Diabetes:Non Diabetic:  <6%Goal of Therapy: <7%Additional Action Suggested:  >8%   05/21/2021 12:57 PM 5.6 4.6 - 6.5 % Final    Comment:    Glycemic Control Guidelines for People with Diabetes:Non Diabetic:  <6%Goal of Therapy: <7%Additional Action Suggested:  >8%     CBG: Recent Labs  Lab 02/10/24 1915 02/10/24 1936 02/10/24 2318 02/11/24 0322 02/11/24 0729  GLUCAP 65* 104* 101* 71 88    Review of Systems:   Unable to assess due to AMS/intubation/sedation   Past Medical History:  She,  has a past medical history of Asthma, Bicuspid aortic valve (09/20/2016), Bronchitis, Coronary artery disease, non-occlusive, Demand ischemia (HCC) (09/20/2016), Heart murmur, Hiatal hernia, History of blood transfusion, Hypertension, Persistent cough for 3 weeks or longer (05/19/2022), and Ulcer.   Surgical History:   Past Surgical History:  Procedure Laterality Date   abdominal tumor     ABLATION     CARDIAC CATHETERIZATION N/A 09/21/2016   Procedure: Left Heart Cath and Coronary Angiography;  Surgeon: Iran Ouch, MD;   Location: [facility] INVASIVE CV LAB;  Service: Cardiovascular;  Laterality: N/A;   COLONOSCOPY N/A 08/14/2016  Procedure: COLONOSCOPY;  Surgeon: Sherrilyn Rist, MD;  Location: [facility] ENDOSCOPY;  Service: Endoscopy;  Laterality: N/A;   ESOPHAGOGASTRODUODENOSCOPY N/A 08/13/2016   Procedure: ESOPHAGOGASTRODUODENOSCOPY (EGD);  Surgeon: Sherrilyn Rist, MD;  Location: [facility] ENDOSCOPY;  Service: Gastroenterology;  Laterality: N/A;   TOOTH EXTRACTION       Social History:   reports that she has never smoked. She has never used smokeless tobacco. She reports that she does not drink alcohol and does not use drugs.   Family History:  Her family history includes Breast cancer in her maternal aunt; Heart disease in her father; Hypertension in her brother, father, and mother; Stroke in her mother.   Allergies Allergies  Allergen Reactions   Amlodipine Rash   Aspirin Anaphylaxis   Dairy Aid [Tilactase] Swelling and Other (See Comments)    Any dairy products   Benadryl [Diphenhydramine] Palpitations   Penicillins Other (See Comments)    Reaction: Unknown     Home Medications  Prior to Admission medications   Medication Sig Start Date End Date Taking? Authorizing Provider  albuterol (VENTOLIN HFA) 108 (90 Base) MCG/ACT inhaler INHALE 2 PUFFS BY MOUTH EVERY 6 HOURS AS NEEDED FOR WHEEZING FOR SHORTNESS OF BREATH 11/28/23  Yes Doreene Nest, NP  Ascorbic Acid (VITAMIN C) 1000 MG tablet Take 1,000 mg by mouth 2 (two) times daily.    Yes [provider]  atorvastatin (LIPITOR) 40 MG tablet TAKE 1 TABLET BY MOUTH ONCE DAILY FOR CHOLESTEROL 05/30/23  Yes Doreene Nest, NP  ferrous sulfate 325 (65 FE) MG tablet Take 650 mg by mouth 2 (two) times daily.   Yes [provider]  Fluticasone Furoate (ARNUITY ELLIPTA) 100 MCG/ACT AEPB Inhale 1 puff into the lungs daily. 05/21/23  Yes Doreene Nest, NP  lisinopril-hydrochlorothiazide (ZESTORETIC) 20-25 MG tablet Take 1 tablet by mouth once  daily for blood pressure 11/28/23  Yes Doreene Nest, NP  magnesium oxide (MAG-OX) 400 MG tablet Take 400 mg by mouth daily.   Yes [provider]  Multiple Vitamins-Calcium (ONE-A-DAY WOMENS PO) Take 1 tablet by mouth daily.   Yes [provider]  Omega-3 Fatty Acids (FISH OIL) 1200 MG CAPS Take 1 capsule by mouth daily.    Yes [provider]  vitamin B-12 (CYANOCOBALAMIN) 1000 MCG tablet Take 1,000 mcg by mouth daily.   Yes [provider]  benzonatate (TESSALON) 200 MG capsule Take 1 capsule (200 mg total) by mouth 3 (three) times daily as needed for cough. Patient not taking: Reported on 02/01/2024 09/24/23   Doreene Nest, NP     Critical care time: 40 minutes     Harlon Ditty, AGACNP-BC [facility] Prefer epic messenger for cross cover needs If after hours, please call E-link

## 2024-02-11 NOTE — Procedures (Signed)
Lumbar Puncture Procedure Note  ARVADA SEABORN  [id number]  November 10, 1961  Date:02/11/24  Time:4:25 PM   Provider Performing:Jean-Pierre Lucion Dilger   Procedure: Lumbar Puncture (66440)  Indication(s) Rule out meningitis  Consent Unable to obtain consent due to emergent nature of procedure.  Anesthesia Topical only with 1% lidocaine    Time Out Verified patient identification, verified procedure, site/side was marked, verified correct patient position, special equipment/implants available, medications/allergies/relevant history reviewed, required imaging and test results available.   Sterile Technique Maximal sterile technique including sterile barrier drape, hand hygiene, sterile gown, sterile gloves, mask, hair covering.    Procedure Description Using palpation, approximate location of L3-L4 space identified.   Lidocaine used to anesthetize skin and subcutaneous tissue overlying this area.  A 20g spinal needle was then used to access the subarachnoid space. Opening pressure:Not obtained. Closing pressure:Not obtained. 15cc CSF obtained.  Complications/Tolerance None; patient tolerated the procedure well.  EBL Minimal  Specimen(s) CSF  Janann Colonel, MD [facility] 02/11/2024 4:26 PM

## 2024-02-11 NOTE — Plan of Care (Signed)
  Problem: Clinical Measurements: Goal: Ability to maintain clinical measurements within normal limits will improve Outcome: Progressing Goal: Will remain free from infection Outcome: Progressing Goal: Diagnostic test results will improve Outcome: Progressing Goal: Respiratory complications will improve Outcome: Progressing Goal: Cardiovascular complication will be avoided Outcome: Progressing   Problem: Activity: Goal: Risk for activity intolerance will decrease Outcome: Progressing   Problem: Coping: Goal: Level of anxiety will decrease Outcome: Progressing   Problem: Elimination: Goal: Will not experience complications related to bowel motility Outcome: Progressing Goal: Will not experience complications related to urinary retention Outcome: Progressing   Problem: Pain Managment: Goal: General experience of comfort will improve and/or be controlled Outcome: Progressing   Problem: Safety: Goal: Ability to remain free from injury will improve Outcome: Progressing   Problem: Skin Integrity: Goal: Risk for impaired skin integrity will decrease Outcome: Progressing   Problem: Activity: Goal: Ability to tolerate increased activity will improve Outcome: Progressing   Problem: Clinical Measurements: Goal: Ability to maintain a body temperature in the normal range will improve Outcome: Progressing   Problem: Respiratory: Goal: Ability to maintain adequate ventilation will improve Outcome: Progressing Goal: Ability to maintain a clear airway will improve Outcome: Progressing   Problem: Respiratory: Goal: Ability to maintain a clear airway and adequate ventilation will improve Outcome: Progressing   Problem: Role Relationship: Goal: Method of communication will improve Outcome: Progressing

## 2024-02-11 NOTE — Consult Note (Signed)
PHARMACY CONSULT NOTE - ELECTROLYTES  Pharmacy Consult for Electrolyte Monitoring and Replacement   Recent Labs: Potassium (mmol/L)  Date Value  02/11/2024 3.7  12/17/2014 3.6   Magnesium (mg/dL)  Date Value  78/29/5621 1.6 (L)  11/01/2013 2.6 (H)   Calcium (mg/dL)  Date Value  30/86/5784 8.3 (L)   Calcium, Total (mg/dL)  Date Value  69/62/9528 9.1   Albumin (g/dL)  Date Value  41/32/4401 2.5 (L)  04/27/2013 3.1 (L)   Phosphorus (mg/dL)  Date Value  02/72/5366 3.1   Sodium (mmol/L)  Date Value  02/11/2024 141  09/28/2016 143  12/17/2014 140   Height: 4\' 9"  (144.8 cm) Weight: 57.4 kg (126 lb 8.7 oz) IBW/kg (Calculated) : 38.6 Estimated Creatinine Clearance: 41.9 mL/min (by C-G formula based on SCr of 1 mg/dL).  Assessment  Michelle Barnett is a 63 y.o. female presenting with respiratory failure. PMH significant for hypertension, hyperlipidemia, iron deficiency anemia . Pharmacy has been consulted to monitor and replace electrolytes.  Diet: NPO, intubated. OG tube MIVF: NA Pertinent medications: N/A  Goal of Therapy: Electrolytes within normal limits K = 3.7 Mg= 1.6 Phos = 3.1  Plan:  Replace Mg with x 1 Magnesium sulfate 2g IV No other replacement needed at this time Re-check comprehensive metabolic panel, Mg and Phos with AM labs  Thank you for allowing pharmacy to be a part of this patient's care.  Effie Shy, PharmD Pharmacy Resident  02/11/2024 8:43 AM

## 2024-02-11 NOTE — Progress Notes (Signed)
Date of Admission:  02/01/2024      ID: Michelle Barnett is a 63 y.o. female Principal Problem:   Pneumonia of right lower lobe due to infectious organism Active Problems:   Essential hypertension   Vitamin B12 deficiency   Adjustment disorder with mixed anxiety and depressed mood   Acute hypoxic respiratory failure (HCC)   GERD (gastroesophageal reflux disease)   Mild intermittent asthma without complication   Hyperlipidemia   Hypoxia   AKI (acute kidney injury) (HCC)   Ecchymosis of left eye   Sepsis (HCC)   Paroxysmal atrial fibrillation (HCC)   Atrial fibrillation with RVR (HCC)   Hiatal hernia   Pneumonia   HSV-1 infection   Acute encephalopathy  Michelle Barnett is a 63 y.o. with a history of HTN, IDA  COPD,  , Presents with fall on Sunday night and was hallucinating, had hematoma left eye, abraded lips, has chronic cough wbnt to her PCP's office on 02/01/24 and pulse ox was low (66%)  and she was sent to ED Had a hematoma above left eye, bruising around left eye Vitals in the ED 100.7, RR20, pulse 113, BP 110/62 Labs revealed Na 132, BUN 71, Cr 1.19, WBC 8.8, HB 13.9, PLT 115 Lactic acid 1 Ct head - no acute findings Blood culture sent Started on ceftriaxone and azithromycin for possible pneumonia EKG- RBBB and then Afib Started IV amiodarone Given IV lasix without much improvement I was asked to see her for the lip lesions whether it could be RIME, mycoplasma related skin and mucous lesions  Subjective Pt remains intubated Had LP today  Medications:   amiodarone  200 mg Per Tube BID   atorvastatin  40 mg Per Tube QHS   Chlorhexidine Gluconate Cloth  6 each Topical Daily   cyanocobalamin  1,000 mcg Per Tube Daily   docusate  100 mg Per Tube BID   ezetimibe  10 mg Per Tube Daily   ipratropium-albuterol  3 mL Nebulization TID   methylPREDNISolone (SOLU-MEDROL) injection  30 mg Intravenous Daily   Followed by   Melene Muller ON 02/14/2024] methylPREDNISolone  (SOLU-MEDROL) injection  20 mg Intravenous Daily   Followed by   Melene Muller ON 02/17/2024] methylPREDNISolone (SOLU-MEDROL) injection  10 mg Intravenous Daily   mupirocin ointment   Nasal BID   mouth rinse  15 mL Mouth Rinse Q2H   pantoprazole (PROTONIX) IV  40 mg Intravenous Daily   polyethylene glycol  17 g Per Tube Daily   QUEtiapine  12.5 mg Per Tube Daily   thiamine (VITAMIN B1) injection  100 mg Intravenous Daily    Objective: Vital signs in last 24 hours: Patient Vitals for the past 24 hrs:  BP Temp Temp src Pulse Resp SpO2 Weight  02/11/24 1445 (!) 130/56 -- -- 99 14 99 % --  02/11/24 1430 139/66 -- -- 94 18 100 % --  02/11/24 1415 138/86 -- -- 92 17 100 % --  02/11/24 1400 134/62 -- -- 92 18 98 % --  02/11/24 1340 124/64 -- -- 85 16 97 % --  02/11/24 1338 (!) 143/68 -- -- 84 17 97 % --  02/11/24 1335 (!) 156/77 -- -- 84 (!) 22 96 % --  02/11/24 1330 (!) 149/76 -- -- 83 18 96 % --  02/11/24 1328 (!) 148/74 -- -- 85 17 95 % --  02/11/24 1324 -- -- -- -- 16 -- --  02/11/24 1315 133/60 -- -- (!) 58 16 94 % --  02/11/24 1300 (!) 82/51 -- -- 73 16 94 % --  02/11/24 1245 (!) 73/45 -- -- 80 12 94 % --  02/11/24 1230 (!) 65/41 -- -- 87 12 92 % --  02/11/24 1215 (!) 107/56 -- -- 100 20 96 % --  02/11/24 1200 (!) 110/56 -- -- 100 18 96 % --  02/11/24 1145 (!) 112/55 -- -- (!) 101 19 96 % --  02/11/24 1130 (!) 112/58 -- -- (!) 104 12 96 % --  02/11/24 1115 106/61 -- -- 100 (!) 26 96 % --  02/11/24 1114 -- 98.9 F (37.2 C) Axillary -- -- -- --  02/11/24 1100 117/64 -- -- (!) 102 16 96 % --  02/11/24 1045 130/64 -- -- (!) 103 18 95 % --  02/11/24 1030 121/65 -- -- 99 17 96 % --  02/11/24 1015 124/65 -- -- 97 17 96 % --  02/11/24 1000 130/69 -- -- 95 17 96 % --  02/11/24 0945 132/72 -- -- 96 (!) 23 97 % --  02/11/24 0900 135/62 -- -- 89 16 97 % --  02/11/24 0815 123/60 98 F (36.7 C) Axillary 95 (!) 22 97 % --  02/11/24 0800 130/64 -- -- 87 16 97 % --  02/11/24 0738 -- -- -- 76 16  98 % --  02/11/24 0730 132/70 -- -- 73 (!) 25 99 % --  02/11/24 0700 (!) 95/52 -- -- 69 12 96 % --  02/11/24 0615 -- -- -- 68 -- 100 % --  02/11/24 0600 (!) 118/55 -- -- 71 -- 100 % --  02/11/24 0545 123/61 -- -- 76 (!) 24 100 % --  02/11/24 0534 -- -- -- 78 14 100 % --  02/11/24 0530 129/69 -- -- 82 15 100 % --  02/11/24 0415 (!) 140/75 -- -- 85 19 99 % --  02/11/24 0400 135/70 98.1 F (36.7 C) Axillary 89 15 98 % --  02/11/24 0345 134/71 -- -- 86 16 99 % --  02/11/24 0330 132/72 -- -- 85 16 99 % 57.4 kg  02/11/24 0000 137/74 98 F (36.7 C) Axillary 91 19 100 % --  02/10/24 2345 134/76 -- -- 89 18 100 % --  02/10/24 2238 (!) 104/52 -- -- 79 (!) 21 100 % --  02/10/24 2236 (!) 89/52 -- -- 83 16 100 % --  02/10/24 2234 (!) 73/48 -- -- 81 16 99 % --  02/10/24 2232 (!) 76/44 -- -- 84 14 99 % --  02/10/24 2230 (!) 75/47 -- -- 84 18 99 % --  02/10/24 2215 (!) 84/49 -- -- 91 16 100 % --  02/10/24 2200 (!) 98/54 -- -- 92 (!) 22 100 % --  02/10/24 2145 (!) 107/59 -- -- 92 19 99 % --  02/10/24 2144 (!) 107/59 -- -- 92 17 100 % --  02/10/24 2130 108/61 -- -- 92 17 99 % --  02/10/24 2115 108/62 -- -- 93 (!) 25 99 % --  02/10/24 2100 108/62 -- -- 94 (!) 21 99 % --  02/10/24 2045 (!) 113/58 -- -- 97 (!) 22 100 % --  02/10/24 2030 -- -- -- 84 20 100 % --  02/10/24 2000 129/63 98.1 F (36.7 C) Axillary 92 20 100 % --  02/10/24 1945 (!) 125/59 -- -- 86 18 100 % --  02/10/24 1930 (!) 127/58 -- -- 83 18 100 % --  02/10/24 1915 (!) 108/53 -- -- 80 15 100 % --  02/10/24 1900 121/61 -- -- 86 16 100 % --  02/10/24 1845 127/60 -- -- 89 17 100 % --  02/10/24 1830 130/63 -- -- 87 17 100 % --  02/10/24 1815 127/61 -- -- 86 18 100 % --  02/10/24 1800 130/67 -- -- 83 17 100 % --  02/10/24 1700 125/60 -- -- 87 17 100 % --  02/10/24 1600 (!) 99/51 98.4 F (36.9 C) -- 85 16 95 % --     PHYSICAL EXAM:  General:intubated Perioral lesions, scabs  Lungs: b/l air entry, crepts Heart: s1s2 systolic  murmur Abdomen: Soft, non-tender,not distended. Bowel sounds normal. No masses Extremities: atraumatic, no cyanosis. No edema. No clubbing Skin: No rashes or lesions. Or bruising Lymph: Cervical, supraclavicular normal. Neurologic:   cannot assess  Lab Results    Latest Ref Rng & Units 02/11/2024    4:04 AM 02/10/2024    1:00 PM 02/09/2024    4:38 AM  CBC  WBC 4.0 - 10.5 K/uL 11.2  7.5  5.8   Hemoglobin 12.0 - 15.0 g/dL 9.9  9.7  40.9   Hematocrit 36.0 - 46.0 % 31.3  29.5  38.1   Platelets 150 - 400 K/uL 204  200  231        Latest Ref Rng & Units 02/11/2024    4:04 AM 02/10/2024    1:00 PM 02/09/2024    4:38 AM  CMP  Glucose 70 - 99 mg/dL 91  811  914   BUN 8 - 23 mg/dL 35  38  47   Creatinine 0.44 - 1.00 mg/dL 7.82  9.56  2.13   Sodium 135 - 145 mmol/L 141  143  154   Potassium 3.5 - 5.1 mmol/L 3.7  3.0  3.5   Chloride 98 - 111 mmol/L 95  98  102   CO2 22 - 32 mmol/L 36  37  40   Calcium 8.9 - 10.3 mg/dL 8.3  8.2  9.3   Total Protein 6.5 - 8.1 g/dL 5.0  4.5  5.6   Total Bilirubin 0.0 - 1.2 mg/dL 0.9  0.7  0.7   Alkaline Phos 38 - 126 U/L 34  28  31   AST 15 - 41 U/L 54  63  77   ALT 0 - 44 U/L 65  66  64     CSF only 2 wbc 29 RBC TP 28  Microbiology: BC- NG HSV DNA of perioral lesion positive for HSV 1 Studies/Results: DG Chest Port 1 View Result Date: 02/10/2024 CLINICAL DATA:  Central line placement attempt EXAM: PORTABLE CHEST 1 VIEW COMPARISON:  Same-day x-ray FINDINGS: Endotracheal tube terminates 2.4 cm above the carina. No central venous lines are seen within the field of view. Overlying cardiac leads. Stable heart size. Bilateral layering pleural effusions with hazy bibasilar opacities. No pneumothorax. IMPRESSION: 1. Endotracheal tube terminates 2.4 cm above the carina. 2. No central venous lines are seen within the field of view. No pneumothorax. 3. Bilateral layering pleural effusions with hazy bibasilar opacities. Electronically Signed   By: Duanne Guess  D.O.   On: 02/10/2024 15:19   DG Chest Port 1 View Result Date: 02/10/2024 CLINICAL DATA:  Acute respiratory failure with hypoxia. EXAM: PORTABLE CHEST 1 VIEW COMPARISON:  Chest x-ray from yesterday. FINDINGS: Unchanged endotracheal tube with the tip 1.7 cm above the carina. Interval removal of the enteric tube. Stable cardiomediastinal silhouette. Right lower lobe consolidation seems mildly improved. Unchanged left lower lobe consolidation and  small bilateral pleural effusions. No pneumothorax. No acute osseous abnormality. IMPRESSION: 1. Multifocal pneumonia, mildly improved in the right lower lobe. 2. Unchanged small bilateral pleural effusions. Electronically Signed   By: Obie Dredge M.D.   On: 02/10/2024 09:54   MR BRAIN W WO CONTRAST Result Date: 02/09/2024 CLINICAL DATA:  Mental status change of unknown cause EXAM: MRI HEAD WITHOUT AND WITH CONTRAST TECHNIQUE: Multiplanar, multiecho pulse sequences of the brain and surrounding structures were obtained without and with intravenous contrast. CONTRAST:  6mL GADAVIST GADOBUTROL 1 MMOL/ML IV SOLN COMPARISON:  Head CT same day FINDINGS: Brain: Diffusion imaging does not show any acute or subacute infarction or other cause of restricted diffusion. Patchy areas of abnormal edema seen in both cerebellar hemispheres. Subcortical edema present within both cerebral hemispheres, particularly posterior. 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 Numerous foci of hemosiderin deposition scattered throughout the brain, some present in areas not affected by the abnormal T2 signal. Therefore, I think these are probably chronic and not sequela of the recent head injury. There is probably a background pattern of mild chronic small-vessel ischemic change of the white matter. No sign of mass, hydrocephalus or extra-axial collection. There is brain atrophy, with some parietal predominance. Vascular: Major vessels at the base of  the brain show flow. Skull and upper cervical spine: Negative Sinuses/Orbits: Sinuses are clear. Patient is intubated. Orbits negative. Other: Left forehead/frontal scalp hematoma. IMPRESSION: 1. Patchy areas of abnormal edema in both cerebellar hemispheres. Subcortical edema within both cerebral hemispheres, particularly posterior. 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 No areas show restricted diffusion to suggest ischemic infarction. 2. Numerous foci of hemosiderin deposition scattered throughout the brain, some present in areas not affected by the abnormal T2 signal. Therefore, I think these are probably chronic and not sequela of the recent head injury. 3. Brain atrophy, with some parietal predominance. 4. Left forehead/frontal scalp hematoma. Electronically Signed   By: Paulina Fusi M.D.   On: 02/09/2024 23:54   EEG adult Result Date: 02/09/2024 Jefferson Fuel, MD     02/09/2024  7:32 PM Routine EEG Report AYODELE HARTSOCK is a 63 y.o. female with a history of seizure who is undergoing an EEG to evaluate for seizures. Report: This EEG was acquired with electrodes placed according to the International 10-20 electrode system (including Fp1, Fp2, F3, F4, C3, C4, P3, P4, O1, O2, T3, T4, T5, T6, A1, A2, Fz, Cz, Pz). The following electrodes were missing or displaced: none. The background rhythm was burst suppression with approximately 35% suppression. The bursts consisted of theta activity ~6 Hz with overriding beta frequencies. There was no clear waking rhythm or sleep architecture. There were no definitive epileptiform abnormalities. There were no electrographic seizures. Impression and clinical correlation: This EEG was obtained while sedated on propofol and comatose and is abnormal due to burst suppression pattern indicative of global cerebral dysfunction, medication effect, or both. Definitive epileptiform abnormalities were not seen during this  recording. Bing Neighbors, MD [facility] [phone] If 7pm- 7am, please page neurology on call as listed in AMION.      Assessment/Plan: 63 yr female presenting with fall, sob X 1 day duration and not feeling well for a few days   ?Acute hypoxic resp failure with hypercapnia- COPD exacerbation/ Community Acquired Pneumonia/Chf Was on  ceftriaxone  ( completed 5 days) and Doxy- and then Doxy- managed as per hosptialist PE NEg  urine legionella Danelle Berry Neg  Acute encephalopathy Etiology was thought to be Secondary to Co2 retention, hypoxia and high dose steroids as per hospitalist  Perioral lesions due to HSV 1  Started valtrex adjusted to crcl for 5-7 days Watch closely for sz, because of AKI  I had signed off on 2/11 But tback on case on 2/12 on the case as she had seizures and 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

## 2024-02-11 NOTE — Consult Note (Signed)
Pharmacy Antimicrobial Note  Michelle Barnett is a 63 y.o. 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  Pharmacy has been consulted for acyclovir dosing.  Plan: Acyclovir 575 mg (10 mg/kg) IV q12h based on current CrCl calculated at 42 ml/min. Low threshold to increase to q8h Await possible LP today - await results and send for meningitis/encephalitis panel  Monitor renal function and need to resume maintenance IVF  Height: 4\' 9"  (144.8 cm) Weight: 57.4 kg (126 lb 8.7 oz) IBW/kg (Calculated) : 38.6  Temp (24hrs), Avg:98.1 F (36.7 C), Min:98 F (36.7 C), Max:98.4 F (36.9 C)  Recent Labs  Lab 02/07/24 0505 02/08/24 0250 02/09/24 0438 02/10/24 1300 02/11/24 0404  WBC 7.2 6.3 5.8 7.5 11.2*  CREATININE 1.40* 1.26* 1.04* 1.00 1.00    Estimated Creatinine Clearance: 41.9 mL/min (by C-G formula based on SCr of 1 mg/dL).    Allergies  Allergen Reactions   Amlodipine Rash   Aspirin Anaphylaxis   Dairy Aid [Tilactase] Swelling and Other (See Comments)    Any dairy products   Benadryl [Diphenhydramine] Palpitations   Penicillins Other (See Comments)    Reaction: Unknown   Antimicrobials this admission: Azithromycin 2/4 >> 2/5 Ceftriaxone 2/4 >> 2/8 Doxycycline 2/6 >> 2/11 Valtrex 2/9 >> 2/11 Acyclovir 2/12 >>   Dose adjustments this admission: N/A  Microbiology results: 2/4 BCx: NG 2/7 VZV PCR: (-) 2/7 Wcx: Staphylococcus epidermidis 2/9 RVP: (-)  Thank you for allowing pharmacy to be a part of this patient's care.  Juliette Alcide, PharmD, BCPS, BCIDP Work Cell: [phone] 02/11/2024 9:53 AM

## 2024-02-12 DIAGNOSIS — I4891 Unspecified atrial fibrillation: Secondary | ICD-10-CM | POA: Diagnosis not present

## 2024-02-12 DIAGNOSIS — J9601 Acute respiratory failure with hypoxia: Secondary | ICD-10-CM | POA: Diagnosis not present

## 2024-02-12 DIAGNOSIS — N179 Acute kidney failure, unspecified: Secondary | ICD-10-CM | POA: Diagnosis not present

## 2024-02-12 LAB — COMPREHENSIVE METABOLIC PANEL
ALT: 48 U/L — ABNORMAL HIGH (ref 0–44)
AST: 31 U/L (ref 15–41)
Albumin: 2.3 g/dL — ABNORMAL LOW (ref 3.5–5.0)
Alkaline Phosphatase: 31 U/L — ABNORMAL LOW (ref 38–126)
Anion gap: 6 (ref 5–15)
BUN: 30 mg/dL — ABNORMAL HIGH (ref 8–23)
CO2: 34 mmol/L — ABNORMAL HIGH (ref 22–32)
Calcium: 8.1 mg/dL — ABNORMAL LOW (ref 8.9–10.3)
Chloride: 101 mmol/L (ref 98–111)
Creatinine, Ser: 0.93 mg/dL (ref 0.44–1.00)
GFR, Estimated: >60 mL/min (ref >60)
Glucose, Bld: 86 mg/dL (ref 70–99)
Potassium: 3.7 mmol/L (ref 3.5–5.1)
Sodium: 141 mmol/L (ref 135–145)
Total Bilirubin: 0.6 mg/dL (ref 0.0–1.2)
Total Protein: 4.9 g/dL — ABNORMAL LOW (ref 6.5–8.1)

## 2024-02-12 LAB — CBC WITH DIFFERENTIAL/PLATELET
Abs Immature Granulocytes: 0.15 10*3/uL — ABNORMAL HIGH (ref 0.00–0.07)
Basophils Absolute: 0 10*3/uL (ref 0.0–0.1)
Basophils Relative: 0 %
Eosinophils Absolute: 0 10*3/uL (ref 0.0–0.5)
Eosinophils Relative: 0 %
HCT: 27.7 % — ABNORMAL LOW (ref 36.0–46.0)
Hemoglobin: 8.9 g/dL — ABNORMAL LOW (ref 12.0–15.0)
Immature Granulocytes: 2 %
Lymphocytes Relative: 11 %
Lymphs Abs: 0.8 10*3/uL (ref 0.7–4.0)
MCH: 30.9 pg (ref 26.0–34.0)
MCHC: 32.1 g/dL (ref 30.0–36.0)
MCV: 96.2 fL (ref 80.0–100.0)
Monocytes Absolute: 1.1 10*3/uL — ABNORMAL HIGH (ref 0.1–1.0)
Monocytes Relative: 14 %
Neutro Abs: 5.8 10*3/uL (ref 1.7–7.7)
Neutrophils Relative %: 73 %
Platelets: 166 10*3/uL (ref 150–400)
RBC: 2.88 MIL/uL — ABNORMAL LOW (ref 3.87–5.11)
RDW: 14.5 % (ref 11.5–15.5)
WBC: 7.9 10*3/uL (ref 4.0–10.5)
nRBC: 0 % (ref 0.0–0.2)

## 2024-02-12 LAB — PHOSPHORUS: Phosphorus: 3.3 mg/dL (ref 2.5–4.6)

## 2024-02-12 LAB — GLUCOSE, CAPILLARY
Glucose-Capillary: 87 mg/dL (ref 70–99)
Glucose-Capillary: 81 mg/dL (ref 70–99)
Glucose-Capillary: 91 mg/dL (ref 70–99)
Glucose-Capillary: 99 mg/dL (ref 70–99)
Glucose-Capillary: 94 mg/dL (ref 70–99)

## 2024-02-12 LAB — MAGNESIUM: Magnesium: 2.3 mg/dL (ref 1.7–2.4)

## 2024-02-12 MED ORDER — APIXABAN 5 MG PO TABS
5.0000 mg | ORAL_TABLET | Freq: Two times a day (BID) | ORAL | Status: DC
  Administered 2024-02-12 – 2024-02-25 (×25): 5 mg via ORAL
  Filled 2024-02-12 (×26): qty 1

## 2024-02-12 MED ORDER — QUETIAPINE FUMARATE 25 MG PO TABS: 12.5000 mg | ORAL_TABLET | Freq: Every day | ORAL | Status: DC

## 2024-02-12 MED ORDER — ATORVASTATIN CALCIUM 20 MG PO TABS
40.0000 mg | ORAL_TABLET | Freq: Every day | ORAL | Status: DC
  Administered 2024-02-12 – 2024-02-24 (×12): 40 mg via ORAL
  Filled 2024-02-12 (×13): qty 2

## 2024-02-12 MED ORDER — SENNOSIDES-DOCUSATE SODIUM 8.6-50 MG PO TABS: 1.0000 | ORAL_TABLET | Freq: Every evening | ORAL | Status: DC | PRN

## 2024-02-12 MED ORDER — POTASSIUM CHLORIDE 20 MEQ PO PACK: 20.0000 meq | PACK | Freq: Once | ORAL | Status: DC

## 2024-02-12 MED ORDER — ACETAMINOPHEN 325 MG PO TABS
650.0000 mg | ORAL_TABLET | Freq: Four times a day (QID) | ORAL | Status: DC | PRN
  Filled 2024-02-12: qty 2

## 2024-02-12 MED ORDER — POTASSIUM CHLORIDE 10 MEQ/100ML IV SOLN
10.0000 meq | INTRAVENOUS | Status: AC
  Administered 2024-02-12 (×2): 10 meq via INTRAVENOUS
  Filled 2024-02-12 (×2): qty 100

## 2024-02-12 MED ORDER — ORAL CARE MOUTH RINSE: 15.0000 mL | OROMUCOSAL | Status: DC | PRN

## 2024-02-12 MED ORDER — VITAMIN B-12 1000 MCG PO TABS
1000.0000 ug | ORAL_TABLET | Freq: Every day | ORAL | Status: DC
  Administered 2024-02-13 – 2024-02-24 (×12): 1000 ug via ORAL
  Filled 2024-02-12 (×13): qty 1

## 2024-02-12 MED ORDER — ORAL CARE MOUTH RINSE
15.0000 mL | OROMUCOSAL | Status: DC
  Administered 2024-02-13 – 2024-02-25 (×43): 15 mL via OROMUCOSAL

## 2024-02-12 MED ORDER — AMIODARONE HCL 200 MG PO TABS
200.0000 mg | ORAL_TABLET | Freq: Two times a day (BID) | ORAL | Status: AC
Start: 2024-02-12 — End: 2024-02-21
  Administered 2024-02-12 – 2024-02-20 (×16): 200 mg via ORAL
  Filled 2024-02-12 (×17): qty 1

## 2024-02-12 MED ORDER — EZETIMIBE 10 MG PO TABS
10.0000 mg | ORAL_TABLET | Freq: Every day | ORAL | Status: DC
  Administered 2024-02-14 – 2024-02-25 (×12): 10 mg via ORAL
  Filled 2024-02-12 (×12): qty 1

## 2024-02-12 NOTE — Plan of Care (Signed)
  Problem: Education: Goal: Knowledge of General Education information will improve Description: Including pain rating scale, medication(s)/side effects and non-pharmacologic comfort measures Outcome: Progressing   Problem: Health Behavior/Discharge Planning: Goal: Ability to manage health-related needs will improve Outcome: Progressing   Problem: Clinical Measurements: Goal: Ability to maintain clinical measurements within normal limits will improve Outcome: Progressing Goal: Diagnostic test results will improve Outcome: Progressing Goal: Respiratory complications will improve Outcome: Progressing Goal: Cardiovascular complication will be avoided Outcome: Progressing   Problem: Activity: Goal: Risk for activity intolerance will decrease Outcome: Progressing   Problem: Coping: Goal: Level of anxiety will decrease Outcome: Progressing   Problem: Elimination: Goal: Will not experience complications related to bowel motility Outcome: Progressing Goal: Will not experience complications related to urinary retention Outcome: Progressing   Problem: Pain Managment: Goal: General experience of comfort will improve and/or be controlled Outcome: Progressing   Problem: Safety: Goal: Ability to remain free from injury will improve Outcome: Progressing   Problem: Skin Integrity: Goal: Risk for impaired skin integrity will decrease Outcome: Progressing   Problem: Activity: Goal: Ability to tolerate increased activity will improve Outcome: Progressing   Problem: Clinical Measurements: Goal: Ability to maintain a body temperature in the normal range will improve Outcome: Progressing   Problem: Respiratory: Goal: Ability to maintain adequate ventilation will improve Outcome: Progressing Goal: Ability to maintain a clear airway will improve Outcome: Progressing   Problem: Role Relationship: Goal: Method of communication will improve Outcome: Progressing

## 2024-02-12 NOTE — Plan of Care (Signed)
Neurology plan of care  No further clinical seizure activity. Per RN, wakes up off sedation, occasionally follows simple commands. Moving all extremities. LP planned for later today. Neurology will f/u when results are available.  Bing Neighbors, MD [facility] [phone]  If 7pm- 7am, please page neurology on call as listed in AMION.

## 2024-02-12 NOTE — Plan of Care (Signed)
Neurology plan of care  Interim data  MRI Brain wwo (Personally reviewed): 1. Patchy areas of abnormal edema in both cerebellar hemispheres. Subcortical edema within both cerebral hemispheres, particularly posterior. 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 No areas show restricted diffusion to suggest ischemic infarction. 2. Numerous foci of hemosiderin deposition scattered throughout the brain, some present in areas not affected by the abnormal T2 signal. Therefore, I think these are probably chronic and not sequela of the recent head injury. 3. Brain atrophy, with some parietal predominance. 4. Left forehead/frontal scalp hematoma.   rEEG 02/09/24 This EEG was obtained while sedated on propofol and comatose and is abnormal due to burst suppression pattern indicative of global cerebral dysfunction, medication effect, or both. Definitive epileptiform abnormalities were not seen during this recording.    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 PRES in this patient is favored to be 2/2 large fluctuations in BP since admission resulting in numerous large acute increased in relative BP. She is not immunosuppressed   No e/o HSV or other infection in CSF, OK to d/c acyclovir  Per ICU team, patient now extubated and doing well without concern for further clinical seizures. They do not have any more questions that neurology can assist them with at this time.  Final recommendations: - D/c acyclovir - Resume eliquis if no contraindication - Continue keppra 500mg  bid. It is possible that this may be weaned in several mos as an outpatient, since PRES is by definition not a permanent condition. - I will arrange for outpatient neurology f/u - Seizure precautions incl no driving x6 mos  after last seizure  Neurology to sign off, but please re-engage if additional neurologic concerns arise.  Bing Neighbors, MD [facility] [phone]  If 7pm- 7am, please page neurology on call as listed in AMION.

## 2024-02-12 NOTE — Progress Notes (Signed)
NAME:  Michelle Barnett, MRN:  [id number], DOB:  [birth year], LOS: 11 ADMISSION DATE:  02/01/2024, CONSULTATION DATE:  02/09/2024 REFERRING MD:  Dr. Meriam Sprague, CHIEF COMPLAINT:  Generalized tonic clonic seizure   Brief Pt Description / Synopsis:  63 y.o. female admitted following a fall at home with Acute Metabolic Encephalopathy, Acute Hypoxic Respiratory Failure in the setting of Community Acquired Pneumonia and Acute Asthma Exacerbation, A.fib with RVR, Acute Kidney Injury, and HSV 1 infection.  Course complicated on 02/09/24 by Generalized Tonic Clonic Seizure with severe hypoxia requiring intubation and mechanical ventilation for airway protection.  History of Present Illness:  Michelle Barnett is 63 y.o. female with hypertension, hyperlipidemia, iron deficiency anemia, who presented to the ED on 02/01/2024 with acute hypoxia.  Pt is currently intubated, sedated, and unable to contribute to history and no family is currently available, therefore history is obtained per chart review.  Per Hospitalist progress notes, "patient was previously in her PCP office where she was noted to be 66% on room air, this improved on 3 L O2 but could not get higher than 88%.  EMS was called and brought to the patient to the hospital.  In the ED she was found to be septic with a temperature of 100.7, respirations 20, tachycardic to 113 and hypoxic.  Labs were mostly unrevealing.  Patient underwent CT head, CT cervical spine, CT maxillofacial which revealed soft tissue swelling of the periorbital soft tissues of the left and along the left frontal scalp.  She received DuoNebs, azithromycin, ceftriaxone.  Hospital stay has been prolonged by ongoing hypoxia, inability to wean oxygen, and persistent delirium.  Patient has now completed her course of antibiotics.  We have initiated steroid taper, but she remains on 6 L nasal cannula.   Patient's stay was also complicated by A-fib RVR necessitating cardiology consult, amiodarone drip  and heparin drip which have since been transitioned to Eliquis and amiodarone p.o.  A-fib RVR likely provoked by hypoxia and breathing treatments. Patient stay is also been complicated by lip lesions which were initially thought to be impetigo but later resulted positive for HSV.  She has had some improvement with mupirocin and was initiated on valacyclovir."  Please see "Significant Hospital Events" section below for full detailed hospital course.    Pertinent  Medical History   Past Medical History:  Diagnosis Date   Asthma    Bicuspid aortic valve 09/20/2016   a. echo 09/19/16: EF 55-60%, GR1DD, possible bicuspid aortic valve without evidence of AS   Bronchitis    Coronary artery disease, non-occlusive    a. cath 09/21/16: ostLM to LM 20%, no evidence of aortic stenosis   Demand ischemia (HCC) 09/20/2016   Heart murmur    Hiatal hernia    History of blood transfusion    Hypertension    Persistent cough for 3 weeks or longer 05/19/2022   Ulcer     Micro Data:  2/4: COVID/Flu/RSV PCR>> negative 2/4: Blood cultures x2>> negative 2/5: HIV Screen>> negative 2/5: RPR>> negative 2/7: Mycoplasma pneumo IgM: < 770 2/7: Varicella-Zoster PCR>>negative 2/7: Wound>> Staphylococcus Epidermidis 2/8: Legionella urinary antigen>> negative 2/9: Respiratory viral panel>> negative 2/12: MRSA PCR>>negative  2/14: CSF fungus>>NGTD 2/14: CSF culture>>NGTD 2/14: Anaerobic culture>>  Antimicrobials:   Anti-infectives (From admission, onward)    Start     Dose/Rate Route Frequency Ordered Stop   02/10/24 0000  acyclovir (ZOVIRAX) 575 mg in dextrose 5 % 100 mL IVPB  Status:  Discontinued  10 mg/kg  57.6 kg 111.5 mL/hr over 60 Minutes Intravenous Every 12 hours 02/09/24 1640 02/12/24 1225   02/09/24 1500  acyclovir (ZOVIRAX) 500 mg in dextrose 5 % 100 mL IVPB  Status:  Discontinued        500 mg 110 mL/hr over 60 Minutes Intravenous Every 12 hours 02/09/24 1326 02/09/24 1640   02/08/24  1130  valACYclovir (VALTREX) tablet 1,000 mg  Status:  Discontinued        1,000 mg Oral 2 times daily 02/08/24 1102 02/09/24 1304   02/07/24 2200  doxycycline (VIBRA-TABS) tablet 100 mg  Status:  Discontinued        100 mg Oral Every 12 hours 02/07/24 1027 02/08/24 1101   02/06/24 1515  valACYclovir (VALTREX) tablet 1,000 mg  Status:  Discontinued        1,000 mg Oral Daily 02/06/24 1424 02/08/24 1102   02/03/24 1000  doxycycline (VIBRAMYCIN) 100 mg in sodium chloride 0.9 % 250 mL IVPB  Status:  Discontinued        100 mg 125 mL/hr over 120 Minutes Intravenous Every 12 hours 02/02/24 1328 02/07/24 1026   02/02/24 1100  azithromycin (ZITHROMAX) 500 mg in sodium chloride 0.9 % 250 mL IVPB  Status:  Discontinued        500 mg 250 mL/hr over 60 Minutes Intravenous Every 24 hours 02/01/24 1402 02/02/24 1328   02/02/24 1000  cefTRIAXone (ROCEPHIN) 2 g in sodium chloride 0.9 % 100 mL IVPB        2 g 200 mL/hr over 30 Minutes Intravenous Every 24 hours 02/01/24 1402 02/05/24 1832   02/01/24 1230  cefTRIAXone (ROCEPHIN) 2 g in sodium chloride 0.9 % 100 mL IVPB        2 g 200 mL/hr over 30 Minutes Intravenous  Once 02/01/24 1218 02/01/24 1309   02/01/24 1230  azithromycin (ZITHROMAX) 500 mg in sodium chloride 0.9 % 250 mL IVPB        500 mg 250 mL/hr over 60 Minutes Intravenous  Once 02/01/24 1218 02/01/24 1421      Significant Hospital Events: Including procedures, antibiotic start and stop dates in addition to other pertinent events   2/4: Admitted by [facility] 2/12: Rapid response called due to witnessed Generalized Tonic Clonic seizure, self resolved.  Never lost pulse, however severely hypoxic, required emergent intubation and mechanical ventilation for airway protection. 2/13: No significant events noted overnight.  MRI Brain last night concerning for PRESS.  Lab unable to obtain blood draws, will place central line for access.  On minimal vent support, requiring minimal Levophed.  Will perform  WUQ and SBT as able. 2/14: No events noted overnight.  Plan for LP today at bedside.  On minimal vent support, will plan for WUA/SBT following LP 2/15: Pt extubated to 3L O2 via nasal canula.  Pending speech/PT/OT   Interim History / Subjective:  As outlined above under significant events   Objective   Blood pressure 133/79, pulse 100, temperature 98.6 F (37 C), temperature source Axillary, resp. rate 20, height 4\' 9"  (1.448 m), weight 58.3 kg, SpO2 97%.    Vent Mode: PSV FiO2 (%):  [26 %-30 %] 26 % Set Rate:  [12 bmp] 12 bmp Vt Set:  [400 mL] 400 mL PEEP:  [5 cmH20] 5 cmH20 Pressure Support:  [5 cmH20] 5 cmH20   Intake/Output Summary (Last 24 hours) at 02/12/2024 1328 Last data filed at 02/12/2024 1226 Gross per 24 hour  Intake 2946.91 ml  Output 876  ml  Net 2070.91 ml   Filed Weights   02/01/24 1212 02/11/24 0330 02/12/24 0330  Weight: 57.6 kg 57.4 kg 58.3 kg    Examination: General: Critically ill-appearing female, laying in bed, NAD on 3L O2 via nasal canula  HENT: Traumatic, bruising noted to left forehead and periorbital region, along with HSV 1 lesions to the mouth with dried blood Lungs: Diminished throughout, even, non labored  Cardiovascular: Sinus tachycardia with depressed T waves, no m/r/g, 2+ radial/1+ distal pulses, no edema  Abdomen: +BS x4, soft, non tender, non distended  Extremities: Normal bulk and tone, no deformities, moves all extremities  Neuro: Awake and following commands, PERRLA  GU: Indwelling foley catheter draining yellow urine   Resolved Hospital Problem list   Mechanical Ventilation  Hypotension   Assessment & Plan:   #Generalized tonic clonic seizure~resolved #Acute metabolic encephalopathy~resolved  #Concern for PRESS MRI Brain consistent with PRESS EEG no PLEDS - Treat metabolic derangements  - Avoid sedating medications as able - Continue keppra 500 mg BID dosing - Meningitis/encephalitis panel 2/14: negative  - Neurology  following, appreciate input - PT/OT/Speech consulted appreciate inpu t  #Acute hypoxic respiratory failure in the setting of CAP, pulmonary edema, and asthma exacerbation - Supplemental O2 for dyspnea and/or hypoxia  - Maintain O2 sats 92% or higher  - Prn bronchodilator therapy  - Continue iv steroid taper   #Atrial fibrillation with rvr~ currently in NSR #HTN  Hx: CAD and bicuspid aortic valve  Echocardiogram 02/02/24: LVEF 55-60%. Normal diastolic parameters. RV systolic function is normal.  RV is moderately enlarged, moderate MR, moderate TR, moderate AS - Continuous telemetry monitoring  - Once able to tolerate po's resume amiodarone, atorvastatin, and ezetimibe  - Prn labetalol and/or hydralazine for bp management   #HSV-1 Infection~improving  #CAP~treated  - Trend WBC and monitor fever curve  - Follow culture results   #Acute kidney Injury~resolved  #Metabolic alkalosis, suspect contraction in setting of diuresis~improving  - Trend BMP  - Strict I&O's - Avoid nephrotoxic agents as able - Replace electrolytes as indicated~pharmacy following for assistance with electrolyte replacement  #Anemia without obvious signs of bleeding  - Trend CBC  - Monitor for s/sx of bleeding  - Transfuse for hgb <7  Best Practice (right click and "Reselect all SmartList Selections" daily)   Diet/type: NPO (unable to place OG due to hiatal hernia, will likely have to consult IR for Dobhoff placement) DVT prophylaxis: will resume subcutaneous heparin 02/16 GI prophylaxis: PPI Lines: Left femoral CVC, and is still needed Foley: Orders placed to discontinue  Code Status:  full code Last date of multidisciplinary goals of care discussion [2/14]  2/15: Updated pts significant other at bedside regarding pts condition and current plan of care.  All questions were answered.   Labs   CBC: Recent Labs  Lab 02/08/24 0250 02/09/24 0438 02/10/24 1300 02/11/24 0404 02/12/24 0406  WBC 6.3 5.8 7.5  11.2* 7.9  NEUTROABS 5.4 4.6 5.6 8.8* 5.8  HGB 11.4* 11.9* 9.7* 9.9* 8.9*  HCT 36.7 38.1 29.5* 31.3* 27.7*  MCV 100.0 98.7 94.2 95.7 96.2  PLT 219 231 200 204 166    Basic Metabolic Panel: Recent Labs  Lab 02/08/24 0250 02/09/24 0438 02/10/24 1300 02/11/24 0404 02/12/24 0406  NA 149* 154* 143 141 141  K 3.7 3.5 3.0* 3.7 3.7  CL 103 102 98 95* 101  CO2 40* 40* 37* 36* 34*  GLUCOSE 150* 134* 132* 91 86  BUN 54* 47* 38* 35*  30*  CREATININE 1.26* 1.04* 1.00 1.00 0.93  CALCIUM 8.9 9.3 8.2* 8.3* 8.1*  MG 2.1 2.1 1.7 1.6* 2.3  PHOS 1.9* 1.8* 2.5 3.1 3.3   GFR: Estimated Creatinine Clearance: 45.5 mL/min (by C-G formula based on SCr of 0.93 mg/dL). Recent Labs  Lab 02/09/24 0438 02/10/24 1300 02/11/24 0404 02/12/24 0406  WBC 5.8 7.5 11.2* 7.9    Liver Function Tests: Recent Labs  Lab 02/08/24 0250 02/09/24 0438 02/10/24 1300 02/11/24 0404 02/12/24 0406  AST 60* 77* 63* 54* 31  ALT 41 64* 66* 65* 48*  ALKPHOS 31* 31* 28* 34* 31*  BILITOT 0.7 0.7 0.7 0.9 0.6  PROT 5.3* 5.6* 4.5* 5.0* 4.9*  ALBUMIN 2.6* 2.7* 2.3* 2.5* 2.3*   No results for input(s): "LIPASE", "AMYLASE" in the last 168 hours. No results for input(s): "AMMONIA" in the last 168 hours.  ABG    Component Value Date/Time   PHART 7.58 (H) 02/10/2024 0355   PCO2ART 43 02/10/2024 0355   PO2ART 144 (H) 02/10/2024 0355   HCO3 40.3 (H) 02/10/2024 0355   O2SAT 99.3 02/10/2024 0355     Coagulation Profile: No results for input(s): "INR", "PROTIME" in the last 168 hours.  Cardiac Enzymes: No results for input(s): "CKTOTAL", "CKMB", "CKMBINDEX", "TROPONINI" in the last 168 hours.  HbA1C: Hgb A1c MFr Bld  Date/Time Value Ref Range Status  05/19/2022 08:06 AM 5.8 4.6 - 6.5 % Final    Comment:    Glycemic Control Guidelines for People with Diabetes:Non Diabetic:  <6%Goal of Therapy: <7%Additional Action Suggested:  >8%   05/21/2021 12:57 PM 5.6 4.6 - 6.5 % Final    Comment:    Glycemic Control  Guidelines for People with Diabetes:Non Diabetic:  <6%Goal of Therapy: <7%Additional Action Suggested:  >8%     CBG: Recent Labs  Lab 02/11/24 1933 02/11/24 2341 02/12/24 0343 02/12/24 0746 02/12/24 1207  GLUCAP 98 92 87 81 91    Review of Systems:   Unable to assess due to AMS/intubation/sedation   Past Medical History:  She,  has a past medical history of Asthma, Bicuspid aortic valve (09/20/2016), Bronchitis, Coronary artery disease, non-occlusive, Demand ischemia (HCC) (09/20/2016), Heart murmur, Hiatal hernia, History of blood transfusion, Hypertension, Persistent cough for 3 weeks or longer (05/19/2022), and Ulcer.   Surgical History:   Past Surgical History:  Procedure Laterality Date   abdominal tumor     ABLATION     CARDIAC CATHETERIZATION N/A 09/21/2016   Procedure: Left Heart Cath and Coronary Angiography;  Surgeon: Iran Ouch, MD;  Location: [facility] INVASIVE CV LAB;  Service: Cardiovascular;  Laterality: N/A;   COLONOSCOPY N/A 08/14/2016   Procedure: COLONOSCOPY;  Surgeon: Sherrilyn Rist, MD;  Location: [facility] ENDOSCOPY;  Service: Endoscopy;  Laterality: N/A;   ESOPHAGOGASTRODUODENOSCOPY N/A 08/13/2016   Procedure: ESOPHAGOGASTRODUODENOSCOPY (EGD);  Surgeon: Sherrilyn Rist, MD;  Location: [facility] ENDOSCOPY;  Service: Gastroenterology;  Laterality: N/A;   TOOTH EXTRACTION       Social History:   reports that she has never smoked. She has never used smokeless tobacco. She reports that she does not drink alcohol and does not use drugs.   Family History:  Her family history includes Breast cancer in her maternal aunt; Heart disease in her father; Hypertension in her brother, father, and mother; Stroke in her mother.   Allergies Allergies  Allergen Reactions   Amlodipine Rash   Aspirin Anaphylaxis   Dairy Aid [Tilactase] Swelling and Other (See Comments)  Any dairy products   Benadryl [Diphenhydramine] Palpitations   Penicillins Other (See Comments)     Reaction: Unknown     Home Medications  Prior to Admission medications   Medication Sig Start Date End Date Taking? Authorizing Provider  albuterol (VENTOLIN HFA) 108 (90 Base) MCG/ACT inhaler INHALE 2 PUFFS BY MOUTH EVERY 6 HOURS AS NEEDED FOR WHEEZING FOR SHORTNESS OF BREATH 11/28/23  Yes Doreene Nest, NP  Ascorbic Acid (VITAMIN C) 1000 MG tablet Take 1,000 mg by mouth 2 (two) times daily.    Yes [provider]  atorvastatin (LIPITOR) 40 MG tablet TAKE 1 TABLET BY MOUTH ONCE DAILY FOR CHOLESTEROL 05/30/23  Yes Doreene Nest, NP  ferrous sulfate 325 (65 FE) MG tablet Take 650 mg by mouth 2 (two) times daily.   Yes [provider]  Fluticasone Furoate (ARNUITY ELLIPTA) 100 MCG/ACT AEPB Inhale 1 puff into the lungs daily. 05/21/23  Yes Doreene Nest, NP  lisinopril-hydrochlorothiazide (ZESTORETIC) 20-25 MG tablet Take 1 tablet by mouth once daily for blood pressure 11/28/23  Yes Doreene Nest, NP  magnesium oxide (MAG-OX) 400 MG tablet Take 400 mg by mouth daily.   Yes [provider]  Multiple Vitamins-Calcium (ONE-A-DAY WOMENS PO) Take 1 tablet by mouth daily.   Yes [provider]  Omega-3 Fatty Acids (FISH OIL) 1200 MG CAPS Take 1 capsule by mouth daily.    Yes [provider]  vitamin B-12 (CYANOCOBALAMIN) 1000 MCG tablet Take 1,000 mcg by mouth daily.   Yes [provider]  benzonatate (TESSALON) 200 MG capsule Take 1 capsule (200 mg total) by mouth 3 (three) times daily as needed for cough. Patient not taking: Reported on 02/01/2024 09/24/23   Doreene Nest, NP     Critical care time: 40 minutes     Zada Girt, AGNP  Pulmonary/Critical Care Pager [phone] (please enter 7 digits) PCCM Consult Pager [phone] (please enter 7 digits)

## 2024-02-12 NOTE — Progress Notes (Signed)
Patient was extubated per Dr. Magdalene Molly. No issues with extubation. Patient extubated to 3L nasal cannula, oxygen saturation on 3L is 96%.

## 2024-02-12 NOTE — Consult Note (Signed)
PHARMACY CONSULT NOTE - ELECTROLYTES  Pharmacy Consult for Electrolyte Monitoring and Replacement   Recent Labs: Potassium (mmol/L)  Date Value  02/12/2024 3.7  12/17/2014 3.6   Magnesium (mg/dL)  Date Value  16/09/9603 2.3  11/01/2013 2.6 (H)   Calcium (mg/dL)  Date Value  54/08/8118 8.1 (L)   Calcium, Total (mg/dL)  Date Value  14/78/2956 9.1   Albumin (g/dL)  Date Value  21/30/8657 2.3 (L)  04/27/2013 3.1 (L)   Phosphorus (mg/dL)  Date Value  84/69/6295 3.3   Sodium (mmol/L)  Date Value  02/12/2024 141  09/28/2016 143  12/17/2014 140   Height: 4\' 9"  (144.8 cm) Weight: 58.3 kg (128 lb 8.5 oz) IBW/kg (Calculated) : 38.6 Estimated Creatinine Clearance: 45.5 mL/min (by C-G formula based on SCr of 0.93 mg/dL).  Assessment  Michelle Barnett is a 63 y.o. female presenting with respiratory failure. PMH significant for hypertension, hyperlipidemia, iron deficiency anemia . Pharmacy has been consulted to monitor and replace electrolytes.  MIVF: NA Pertinent medications: amio,   Goal of Therapy: Electrolytes within normal limits (K+ >4)  Plan:  KCL 20 mEq x 1  F/u with AM labs.   Thank you for allowing pharmacy to be a part of this patient's care.  Ronnald Ramp, PharmD 02/12/2024 12:06 PM

## 2024-02-13 DIAGNOSIS — J189 Pneumonia, unspecified organism: Secondary | ICD-10-CM | POA: Diagnosis not present

## 2024-02-13 LAB — COMPREHENSIVE METABOLIC PANEL
ALT: 42 U/L (ref 0–44)
AST: 33 U/L (ref 15–41)
Albumin: 2.6 g/dL — ABNORMAL LOW (ref 3.5–5.0)
Alkaline Phosphatase: 34 U/L — ABNORMAL LOW (ref 38–126)
Anion gap: 10 (ref 5–15)
BUN: 24 mg/dL — ABNORMAL HIGH (ref 8–23)
CO2: 32 mmol/L (ref 22–32)
Calcium: 8.5 mg/dL — ABNORMAL LOW (ref 8.9–10.3)
Chloride: 104 mmol/L (ref 98–111)
Creatinine, Ser: 0.8 mg/dL (ref 0.44–1.00)
GFR, Estimated: >60 mL/min (ref >60)
Glucose, Bld: 86 mg/dL (ref 70–99)
Potassium: 3.9 mmol/L (ref 3.5–5.1)
Sodium: 146 mmol/L — ABNORMAL HIGH (ref 135–145)
Total Bilirubin: 0.9 mg/dL (ref 0.0–1.2)
Total Protein: 5.2 g/dL — ABNORMAL LOW (ref 6.5–8.1)

## 2024-02-13 LAB — CBC WITH DIFFERENTIAL/PLATELET
Abs Immature Granulocytes: 0.15 10*3/uL — ABNORMAL HIGH (ref 0.00–0.07)
Basophils Absolute: 0 10*3/uL (ref 0.0–0.1)
Basophils Relative: 0 %
Eosinophils Absolute: 0 10*3/uL (ref 0.0–0.5)
Eosinophils Relative: 0 %
HCT: 27.7 % — ABNORMAL LOW (ref 36.0–46.0)
Hemoglobin: 8.9 g/dL — ABNORMAL LOW (ref 12.0–15.0)
Immature Granulocytes: 2 %
Lymphocytes Relative: 9 %
Lymphs Abs: 0.7 10*3/uL (ref 0.7–4.0)
MCH: 31.1 pg (ref 26.0–34.0)
MCHC: 32.1 g/dL (ref 30.0–36.0)
MCV: 96.9 fL (ref 80.0–100.0)
Monocytes Absolute: 1.1 10*3/uL — ABNORMAL HIGH (ref 0.1–1.0)
Monocytes Relative: 15 %
Neutro Abs: 5.5 10*3/uL (ref 1.7–7.7)
Neutrophils Relative %: 74 %
Platelets: 145 10*3/uL — ABNORMAL LOW (ref 150–400)
RBC: 2.86 MIL/uL — ABNORMAL LOW (ref 3.87–5.11)
RDW: 14.6 % (ref 11.5–15.5)
WBC: 7.5 10*3/uL (ref 4.0–10.5)
nRBC: 0 % (ref 0.0–0.2)

## 2024-02-13 LAB — FOLATE: Folate: 16.9 ng/mL (ref >5.9)

## 2024-02-13 LAB — VITAMIN D 25 HYDROXY (VIT D DEFICIENCY, FRACTURES): Vit D, 25-Hydroxy: 46.45 ng/mL (ref 30–100)

## 2024-02-13 LAB — IRON AND TIBC
Iron: 63 ug/dL (ref 28–170)
Saturation Ratios: 35 % — ABNORMAL HIGH (ref 10.4–31.8)
TIBC: 179 ug/dL — ABNORMAL LOW (ref 250–450)
UIBC: 116 ug/dL

## 2024-02-13 LAB — GLUCOSE, CAPILLARY
Glucose-Capillary: 120 mg/dL — ABNORMAL HIGH (ref 70–99)
Glucose-Capillary: 74 mg/dL (ref 70–99)
Glucose-Capillary: 75 mg/dL (ref 70–99)
Glucose-Capillary: 76 mg/dL (ref 70–99)

## 2024-02-13 LAB — MAGNESIUM: Magnesium: 2.1 mg/dL (ref 1.7–2.4)

## 2024-02-13 LAB — PHOSPHORUS: Phosphorus: 2.7 mg/dL (ref 2.5–4.6)

## 2024-02-13 MED ORDER — VALACYCLOVIR HCL 500 MG PO TABS
1000.0000 mg | ORAL_TABLET | Freq: Two times a day (BID) | ORAL | Status: AC
  Administered 2024-02-13 – 2024-02-14 (×3): 1000 mg via ORAL
  Filled 2024-02-13 (×5): qty 2

## 2024-02-13 MED ORDER — POTASSIUM CHLORIDE 10 MEQ/100ML IV SOLN
10.0000 meq | INTRAVENOUS | Status: AC
  Administered 2024-02-13 (×2): 10 meq via INTRAVENOUS
  Filled 2024-02-13 (×3): qty 100

## 2024-02-13 MED ORDER — SODIUM CHLORIDE 0.9% FLUSH
10.0000 mL | Freq: Two times a day (BID) | INTRAVENOUS | Status: DC
  Administered 2024-02-13 – 2024-02-24 (×11): 10 mL

## 2024-02-13 MED ORDER — METOPROLOL TARTRATE 50 MG PO TABS
50.0000 mg | ORAL_TABLET | Freq: Two times a day (BID) | ORAL | Status: DC
  Administered 2024-02-13 – 2024-02-25 (×23): 50 mg via ORAL
  Filled 2024-02-13 (×25): qty 1

## 2024-02-13 MED ORDER — SODIUM CHLORIDE 0.9% FLUSH: 10.0000 mL | INTRAVENOUS | Status: DC | PRN

## 2024-02-13 NOTE — Progress Notes (Signed)
PT Cancellation Note  Patient Details Name: Michelle Barnett MRN: [id number] DOB: November 17, 1961   Cancelled Treatment:    Reason Eval/Treat Not Completed: Patient not medically ready. Patient tachy and RR in 30s. Will hold eval until more medically appropriate.    [name] 02/13/2024, 3:23 PM

## 2024-02-13 NOTE — Progress Notes (Signed)
Patient refused meds after I pulled them and placed them in applesauce. She would not open her mouth to take them and stated "I am not going to open my mouth". Charted refused.

## 2024-02-13 NOTE — Progress Notes (Signed)
[facility] Progress Note  Patient: Michelle Barnett    UJW:[id number]  DOA: 02/01/2024     Date of Service: the patient was seen and examined on 02/13/2024  Chief Complaint  Patient presents with   Respiratory Distress   Brief hospital course: Michelle Barnett is 63 y.o. female with hypertension, hyperlipidemia, iron deficiency anemia, who presented to the ED on 02/01/2024 with acute hypoxia. Pt is currently intubated, sedated, and unable to contribute to history and no family is currently available, therefore history was obtained per chart review.  patient was previously in her PCP office where she was noted to be 66% on room air, this improved on 3 L O2 but could not get higher than 88%.  EMS was called and brought to the patient to the hospital.  In the ED she was found to be septic with a temperature of 100.7, respirations 20, tachycardic to 113 and hypoxic.  Labs were mostly unrevealing.  Patient underwent CT head, CT cervical spine, CT maxillofacial which revealed soft tissue swelling of the periorbital soft tissues of the left and along the left frontal scalp.  She received DuoNebs, azithromycin, ceftriaxone.  Hospital stay has been prolonged by ongoing hypoxia, inability to wean oxygen, and persistent delirium.  Patient has now completed her course of antibiotics.  We have initiated steroid taper, but she remains on 6 L nasal cannula.   Patient's stay was also complicated by A-fib RVR necessitating cardiology consult, amiodarone drip and heparin drip which have since been transitioned to Eliquis and amiodarone p.o.  A-fib RVR likely provoked by hypoxia and breathing treatments. Patient stay is also been complicated by lip lesions which were initially thought to be impetigo but later resulted positive for HSV.  She has had some improvement with mupirocin and was initiated on valacyclovir."  2/4: Admitted by [facility]. 2/12: Rapid response called due to witnessed Generalized Tonic Clonic seizure,  self resolved.  Never lost pulse, however severely hypoxic, required emergent intubation and mechanical ventilation for airway protection. 2/13: No significant events noted overnight.  MRI Brain last night concerning for PRESS.  Lab unable to obtain blood draws, will place central line for access.  On minimal vent support, requiring minimal Levophed.  Will perform WUQ and SBT as able. 2/14: No events noted overnight.  Plan for LP today at bedside.  On minimal vent support, will plan for WUA/SBT following LP 2/15: Pt extubated to 3L O2 via nasal canula.  Pending speech/PT/OT   2/16 patient was transferred to [facility] service.  Please review previous notes for details, and further management as below.   Assessment and Plan:  63 y.o. female admitted following a fall at home with Acute Metabolic Encephalopathy, Acute Hypoxic Respiratory Failure in the setting of Community Acquired Pneumonia and Acute Asthma Exacerbation, A.fib with RVR, Acute Kidney Injury, and HSV 1 infection. Course complicated on 02/09/24 by Generalized Tonic Clonic Seizure with severe hypoxia requiring intubation and mechanical ventilation for airway protection.    # Generalized tonic clonic seizure~resolved # Acute metabolic encephalopathy~resolved  # Concern for PRESS MRI Brain consistent with PRESS EEG no PLEDS - Treat metabolic derangements  - Avoid sedating medications as able - Continue keppra 500 mg BID dosing - Meningitis/encephalitis panel 2/14: negative  - Neurology consulted: - D/c acyclovir, less likely herpes encephalitis as per ID - Resume eliquis if no contraindication - Continue keppra 500mg  bid. It is possible that this may be weaned in several mos as an outpatient, since PRES is by  definition not a permanent condition. - I will arrange for outpatient neurology f/u - Seizure precautions incl no driving x6 mos after last seizure  - PT/OT/Speech consulted appreciate inpu t   #Acute hypoxic respiratory failure  in the setting of CAP, pulmonary edema, and asthma exacerbation - Supplemental O2 for dyspnea and/or hypoxia  - Maintain O2 sats 92% or higher  - Prn bronchodilator therapy  - Continue iv steroid taper    #Atrial fibrillation with rvr~ currently in NSR #HTN  Hx: CAD and bicuspid aortic valve  Echocardiogram 02/02/24: LVEF 55-60%. Normal diastolic parameters. RV systolic function is normal.  RV is moderately enlarged, moderate MR, moderate TR, moderate AS - Continuous telemetry monitoring  - Once able to tolerate po's resume amiodarone, atorvastatin, and ezetimibe  - Prn labetalol and/or hydralazine for bp management  2/16 started metoprolol 50 mg p.o. twice daily  #HSV-1 Infection~improving  #CAP~treated  - Trend WBC and monitor fever curve  - Follow culture results  S/p acyclovir, CSF negative so it has been discontinued. Started Valtrex 1000 mg twice daily for 2 days ID consulted   #Acute kidney Injury~resolved  #Metabolic alkalosis, suspect contraction in setting of diuresis~improving  - Trend BMP  - Strict I&O's - Avoid nephrotoxic agents as able - Replace electrolytes as indicated~pharmacy following for assistance with electrolyte replacement   #Anemia without obvious signs of bleeding  - Trend CBC  - Monitor for s/sx of bleeding  - Transfuse for hgb <7   Body mass index is 27.77 kg/m.  Nutrition Problem: Inadequate oral intake Etiology: inability to eat (pt sedated and ventilated) Interventions:  Diet: NPO, fd/u SLP  DVT Prophylaxis: Eliquis  Advance goals of care discussion: Full code  Family Communication: family was present at bedside, at the time of interview.  The pt provided permission to discuss medical plan with the family. Opportunity was given to ask question and all questions were answered satisfactorily.   Disposition:  Pt is from Home, admitted with Seizures, Resp Failure, still has Resp Failure, which precludes a safe discharge. Discharge to Home  with HH, when stable, may need few days more to improve.  Subjective: No significant overnight events, patient is AO x 3, denies any headache or dizziness, no chest pain or palpitation, no shortness of breath.  Resting comfortably, did not offer any complaints.  Physical Exam: General: NAD, lying comfortably Appear in no distress, affect appropriate Eyes: PERRLA ENT: Oral Mucosa Clear, moist  Neck: no JVD,  Cardiovascular: S1 and S2 Present, no Murmur,  Respiratory: Equal air entry bilaterally, mild bibasilar crackles, no wheezes. Abdomen: Bowel Sound present, Soft and no tenderness,  Skin: no rashes Extremities: no Pedal edema, no calf tenderness, mild edema of bilateral hands Neurologic: without any new focal findings Gait not checked due to patient safety concerns  Vitals:   02/13/24 0900 02/13/24 1000 02/13/24 1100 02/13/24 1200  BP: (!) 150/78 (!) 160/83 (!) 154/82 (!) 170/92  Pulse: 87 93 84 93  Resp: 19 18 18  (!) 24  Temp:      TempSrc:      SpO2: 99% 98% 99% 95%  Weight:      Height:        Intake/Output Summary (Last 24 hours) at 02/13/2024 1237 Last data filed at 02/13/2024 0700 Gross per 24 hour  Intake 619.52 ml  Output 1550 ml  Net -930.48 ml   Filed Weights   02/11/24 0330 02/12/24 0330 02/13/24 0412  Weight: 57.4 kg 58.3 kg 58.2 kg  Data Reviewed: I have personally reviewed and interpreted daily labs, tele strips, imagings as discussed above. I reviewed all nursing notes, pharmacy notes, vitals, pertinent old records I have discussed plan of care as described above with RN and patient/family.  CBC: Recent Labs  Lab 02/09/24 0438 02/10/24 1300 02/11/24 0404 02/12/24 0406 02/13/24 0424  WBC 5.8 7.5 11.2* 7.9 7.5  NEUTROABS 4.6 5.6 8.8* 5.8 5.5  HGB 11.9* 9.7* 9.9* 8.9* 8.9*  HCT 38.1 29.5* 31.3* 27.7* 27.7*  MCV 98.7 94.2 95.7 96.2 96.9  PLT 231 200 204 166 145*   Basic Metabolic Panel: Recent Labs  Lab 02/09/24 0438 02/10/24 1300  02/11/24 0404 02/12/24 0406 02/13/24 0424  NA 154* 143 141 141 146*  K 3.5 3.0* 3.7 3.7 3.9  CL 102 98 95* 101 104  CO2 40* 37* 36* 34* 32  GLUCOSE 134* 132* 91 86 86  BUN 47* 38* 35* 30* 24*  CREATININE 1.04* 1.00 1.00 0.93 0.80  CALCIUM 9.3 8.2* 8.3* 8.1* 8.5*  MG 2.1 1.7 1.6* 2.3 2.1  PHOS 1.8* 2.5 3.1 3.3 2.7    Studies: No results found.  Scheduled Meds:  amiodarone  200 mg Oral BID   apixaban  5 mg Oral BID   atorvastatin  40 mg Oral QHS   Chlorhexidine Gluconate Cloth  6 each Topical Daily   cyanocobalamin  1,000 mcg Oral Daily   ezetimibe  10 mg Oral Daily   [START ON 02/14/2024] methylPREDNISolone (SOLU-MEDROL) injection  20 mg Intravenous Daily   Followed by   Melene Muller ON 02/17/2024] methylPREDNISolone (SOLU-MEDROL) injection  10 mg Intravenous Daily   mupirocin ointment   Nasal BID   mouth rinse  15 mL Mouth Rinse 4 times per day   pantoprazole (PROTONIX) IV  40 mg Intravenous Daily   polyethylene glycol  17 g Per Tube Daily   sodium chloride flush  10-40 mL Intracatheter Q12H   thiamine (VITAMIN B1) injection  100 mg Intravenous Daily   Continuous Infusions:  levETIRAcetam Stopped (02/13/24 0611)   potassium chloride 10 mEq (02/13/24 1224)   PRN Meds: acetaminophen, hydrALAZINE, HYDROcodone-acetaminophen, ipratropium-albuterol, labetalol, mouth rinse, mouth rinse, senna-docusate, sodium chloride flush  Time spent: 55 minutes  Author: Gillis Santa. MD [facility] 02/13/2024 12:37 PM  To reach On-call, see care teams to locate the attending and reach out to them via [email or web address]. If 7PM-7AM, please contact night-coverage If you still have difficulty reaching the attending provider, please page the [facility] (Director on Call) for [facility] on amion for assistance.

## 2024-02-13 NOTE — Progress Notes (Signed)
Report given to receiving nurse, Norberta Keens.

## 2024-02-13 NOTE — Progress Notes (Signed)
Left femoral triple lumen CVC removed, pressure applied, and pressure dressing placed.

## 2024-02-13 NOTE — Progress Notes (Signed)
Patient noted to have picked at left femoral site where CVC was removed causing it to bleed; area cleansed and new dressing applied.

## 2024-02-13 NOTE — Plan of Care (Signed)
 Continuing with plan of care.

## 2024-02-13 NOTE — Evaluation (Signed)
Clinical/Bedside Swallow Evaluation Patient Details  Name: Michelle Barnett MRN: [id number] Date of Birth: January 17, 1961  Today's Date: 02/13/2024 Time: SLP Start Time (ACUTE ONLY): 1140 SLP Stop Time (ACUTE ONLY): 1230 SLP Time Calculation (min) (ACUTE ONLY): 50 min  Past Medical History:  Past Medical History:  Diagnosis Date   Asthma    Bicuspid aortic valve 09/20/2016   a. echo 09/19/16: EF 55-60%, GR1DD, possible bicuspid aortic valve without evidence of AS   Bronchitis    Coronary artery disease, non-occlusive    a. cath 09/21/16: ostLM to LM 20%, no evidence of aortic stenosis   Demand ischemia (HCC) 09/20/2016   Heart murmur    Hiatal hernia    History of blood transfusion    Hypertension    Persistent cough for 3 weeks or longer 05/19/2022   Ulcer    Past Surgical History:  Past Surgical History:  Procedure Laterality Date   abdominal tumor     ABLATION     CARDIAC CATHETERIZATION N/A 09/21/2016   Procedure: Left Heart Cath and Coronary Angiography;  Surgeon: Iran Ouch, MD;  Location: [facility] INVASIVE CV LAB;  Service: Cardiovascular;  Laterality: N/A;   COLONOSCOPY N/A 08/14/2016   Procedure: COLONOSCOPY;  Surgeon: Sherrilyn Rist, MD;  Location[facility] ENDOSCOPY;  Service: Endoscopy;  Laterality: N/A;   ESOPHAGOGASTRODUODENOSCOPY N/A 08/13/2016   Procedure: ESOPHAGOGASTRODUODENOSCOPY (EGD);  Surgeon: Sherrilyn Rist, MD;  Location: [facility] ENDOSCOPY;  Service: Gastroenterology;  Laterality: N/A;   TOOTH EXTRACTION     HPI:  Michelle Barnett is 63 y.o. female with hypertension, hyperlipidemia, iron deficiency anemia, who presents to the ED with acute hypoxia, scabbing of lips.  Patient was previously in her PCP office where she was noted to be 66% on room air, this improved on 3 L O2 but could not get higher than 88%.  EMS was called and brought the patient to the hospital.  In the ED she was found to be septic with a temperature of 100.7, respirations 20, tachycardic to 113  and hypoxic.  Labs were mostly unrevealing.  Patient underwent CT head, CT cervical spine, CT maxillofacial which revealed soft tissue swelling of the periorbital soft tissues of the left and along the left frontal scalp.  She received DuoNebs, azithromycin, ceftriaxone. Pt admitted for right middle lobe PNA with acute hypoxic respiratory failure. Pt is currently on 6L nasal canula. Pt with chronic hiatal hernia. CXR 2/7: Ventilation not significantly changed since 02/01/2024. Veiling lung base opacity superimposed on large gastric hiatal hernia. Differential considerations remain pneumonia, atelectasis, pleural effusions. Chest CT 2/7: Small bilateral pleural effusions. Consolidation in both lower lobes concerning for pneumonia at admit.    Post admit per chart notes:  Rapid response called due to witnessed Generalized Tonic Clonic seizure, self resolved.  Never lost pulse, however severely hypoxic, required emergent intubation and mechanical ventilation for airway protection.  2/13: No significant events noted overnight.  MRI Brain last night concerning for PRESS.  Lab unable to obtain blood draws, will place central line for access.  On minimal vent support, requiring minimal Levophed.  Will perform WUQ and SBT as able.  2/14: No events noted overnight.  Plan for LP today at bedside.  On minimal vent support, will plan for WUA/SBT following LP  2/15: Pt extubated to 3L O2 via nasal canula.     Assessment / Plan / Recommendation  Clinical Impression   Pt seen for repeat BSE s/p oral intubation/extubation for ~3 days. Pt  required intubation on 02/09/24-02/12/24 s/p Rapid Response for seizure. Pt has tolerated extubation well.  Pt alert, talkative w/ Staff w/ quick distraction and tangential speech intermittently. Pt was easily redirected w/ verbal/visual cues. She required MOD assistance w/ self-feeding. Noted scabbing of lips.  On [place] o2 2L; afebrile. WBC WNL.  Pt appears to present w/ grossly functional  oropharyngeal phase swallow w/ No pharyngeal phase dysphagia noted, No neuromuscular deficits noted during oropharyngeal swallowing. Pt's labial strength/ROM engagement is hampered by MOD scabbing of U/L lips currently. Pt consumed po trials w/ No overt, clinical s/s of aspiration during po trials.  Pt appears at reduced risk for aspiration when following general aspiration precautions and using a modified diet consistency current for easiest oral intake/control. Pt does have challenging factors that could impact her oropharyngeal swallowing to include discomfort of scabbed lips(NSG aware), deconditioning/weakness, recent intubation/extubation, some Confusion currently, and lengthy illness/hospitalization now. These factors can increase risk for dysphagia as well as decreased oral intake overall.   During po trials of liquids and purees, pt consumed consistencies w/ no overt coughing, decline in vocal quality, or change in respiratory presentation during/post trials. O2 sats remained in mid-upper 90s. Oral phase appeared grossly [facility] w/ timely bolus management and control of bolus propulsion for A-P transfer for swallowing. Oral clearing achieved w/ all trial consistencies. She had difficulty pulling boluses from utensil and labial seal on utensil and straw.  OM Exam appeared [facility] for lingual strength/ROM w/ no unilateral weakness noted. Speech Clear. Pt helped to feed self by holding Cup to drink w/ support, cues d/t distraction.   Recommend a Puree consistency (dys 1) diet w/ well-moistened foods; Thin liquids -- carefully monitor straw use, and pt should help to Hold Cup when drinking. Recommend general aspiration precautions. Feeding support at meals; reduce distractions/talking. Pills CRUSHED vs WHOLE in Puree for safer, easier swallowing. Oral care.   Education given on Pills in Puree; food consistencies and options; general aspiration precautions to pt and NSG. NSG updated, agreed. MD updated.  ST  services will f/u w/ toleration of diet and trials to upgrade diet as appropriate during this admit - CLE as indicated. F/u at next venue of care as recommended. Recommend Dietician f/u for support. SLP Visit Diagnosis: Dysphagia, unspecified (R13.10) (in setting of scabbed lips; Cognitive decline)    Aspiration Risk  Mild aspiration risk;Risk for inadequate nutrition/hydration (reduced when following general aspiration precautions)    Diet Recommendation   Thin;Dysphagia 1 (puree) = a Puree consistency (dys 1) diet w/ well-moistened foods; Thin liquids -- carefully monitor straw use, and pt should help to Hold Cup when drinking. Recommend general aspiration precautions. Feeding support at meals; reduce distractions/talking.   Medication Administration: Crushed with puree    Other  Recommendations Recommended Consults: Consider GI evaluation (for management of hiatal hernia if indicated; Dietician) Oral Care Recommendations: Oral care BID;Oral care before and after PO;Staff/trained caregiver to provide oral care    Recommendations for follow up therapy are one component of a multi-disciplinary discharge planning process, led by the attending physician.  Recommendations may be updated based on patient status, additional functional criteria and insurance authorization.  Follow up Recommendations Follow physician's recommendations for discharge plan and follow up therapies (next venue of care)      Assistance Recommended at Discharge  FULL  Functional Status Assessment Patient has had a recent decline in their functional status and demonstrates the ability to make significant improvements in function in a reasonable and predictable amount of  time.  Frequency and Duration min 2x/week  2 weeks       Prognosis Prognosis for improved oropharyngeal function: Fair (-Good) Barriers to Reach Goals: Cognitive deficits;Time post onset;Severity of deficits Barriers/Prognosis Comment: cognitive decline  - min; sore lips      Swallow Study   General Date of Onset: 02/07/24 HPI: Michelle Barnett is 63 y.o. female with hypertension, hyperlipidemia, iron deficiency anemia, who presents to the ED with acute hypoxia, scabbing of lips.  Patient was previously in her PCP office where she was noted to be 66% on room air, this improved on 3 L O2 but could not get higher than 88%.  EMS was called and brought the patient to the hospital.  In the ED she was found to be septic with a temperature of 100.7, respirations 20, tachycardic to 113 and hypoxic.  Labs were mostly unrevealing.  Patient underwent CT head, CT cervical spine, CT maxillofacial which revealed soft tissue swelling of the periorbital soft tissues of the left and along the left frontal scalp.  She received DuoNebs, azithromycin, ceftriaxone. Pt admitted for right middle lobe PNA with acute hypoxic respiratory failure. Pt is currently on 6L nasal canula. Pt with chronic hiatal hernia. CXR 2/7: Ventilation not significantly changed since 02/01/2024. Veiling lung base opacity superimposed on large gastric hiatal hernia. Differential considerations remain pneumonia, atelectasis, pleural effusions. Chest CT 2/7: Small bilateral pleural effusions. Consolidation in both lower lobes concerning for pneumonia at admit.  Post admit per chart notes:  Rapid response called due to witnessed Generalized Tonic Clonic seizure, self resolved.  Never lost pulse, however severely hypoxic, required emergent intubation and mechanical ventilation for airway protection.  2/13: No significant events noted overnight.  MRI Brain last night concerning for PRESS.  Lab unable to obtain blood draws, will place central line for access.  On minimal vent support, requiring minimal Levophed.  Will perform WUQ and SBT as able.  2/14: No events noted overnight.  Plan for LP today at bedside.  On minimal vent support, will plan for WUA/SBT following LP  2/15: Pt extubated to 3L O2 via nasal  canula. Type of Study: Bedside Swallow Evaluation Previous Swallow Assessment: 2/10 - this admit Diet Prior to this Study: NPO (had been on a dys 2 w/ thins per prior assessment.) Temperature Spikes Noted: No (wbc 7.5) Respiratory Status: Nasal cannula (2L) History of Recent Intubation: Yes Total duration of intubation (days): 3 days Date extubated: 02/12/24 Behavior/Cognition: Alert;Cooperative;Pleasant mood;Confused;Distractible;Requires cueing Oral Cavity Assessment: Lesions;Dry (scabbing of u/l lips) Oral Care Completed by SLP: Yes Oral Cavity - Dentition: Missing dentition;Poor condition Vision: Functional for self-feeding (to hold cup to drink) Self-Feeding Abilities: Able to feed self;Needs assist;Needs set up;Total assist Patient Positioning: Upright in bed (MAX assist) Baseline Vocal Quality: Normal (grossly) Volitional Cough: Strong Volitional Swallow: Able to elicit    Oral/Motor/Sensory Function Overall Oral Motor/Sensory Function: Generalized oral weakness (labial; No lingual weakness)   Ice Chips Ice chips: Within functional limits Presentation: Spoon (fed; 3 trials)   Thin Liquid Thin Liquid: Within functional limits (grossly in setting of scabbed lips) Presentation: Straw (fed; 12+ trials) Oral Phase Impairments: Reduced labial seal (slight) Oral Phase Functional Implications:  (none) Pharyngeal  Phase Impairments:  (none)    Nectar Thick Nectar Thick Liquid: Within functional limits (same as w/ thins) Presentation: Straw (fed; 5 trials)   Honey Thick Honey Thick Liquid: Not tested   Puree Puree: Within functional limits Presentation: Spoon (fed; 10+ trials) Other Comments: same as  w/ thins   Solid     Solid: Not tested         Jerilynn Som, MS, CCC-SLP Speech Language Pathologist Rehab Services; [facility] [place] [phone] (ascom) Amauri Medellin 02/13/2024,3:28 PM

## 2024-02-13 NOTE — Plan of Care (Signed)
  Problem: Clinical Measurements: Goal: Respiratory complications will improve Outcome: Progressing Goal: Cardiovascular complication will be avoided Outcome: Progressing   Problem: Activity: Goal: Risk for activity intolerance will decrease Outcome: Progressing   Problem: Elimination: Goal: Will not experience complications related to bowel motility Outcome: Progressing   Problem: Pain Managment: Goal: General experience of comfort will improve and/or be controlled Outcome: Progressing

## 2024-02-13 NOTE — Progress Notes (Signed)
Patient transferred to new room on hospital bed, with all belongings, on cardiac monitoring, and oxygen via nasal cannula.

## 2024-02-13 NOTE — Consult Note (Signed)
PHARMACY CONSULT NOTE - ELECTROLYTES  Pharmacy Consult for Electrolyte Monitoring and Replacement   Recent Labs: Potassium (mmol/L)  Date Value  02/13/2024 3.9  12/17/2014 3.6   Magnesium (mg/dL)  Date Value  82/95/6213 2.1  11/01/2013 2.6 (H)   Calcium (mg/dL)  Date Value  08/65/7846 8.5 (L)   Calcium, Total (mg/dL)  Date Value  96/29/5284 9.1   Albumin (g/dL)  Date Value  13/24/4010 2.6 (L)  04/27/2013 3.1 (L)   Phosphorus (mg/dL)  Date Value  27/25/3664 2.7   Sodium (mmol/L)  Date Value  02/13/2024 146 (H)  09/28/2016 143  12/17/2014 140   Height: 4\' 9"  (144.8 cm) Weight: 58.2 kg (128 lb 4.9 oz) IBW/kg (Calculated) : 38.6 Estimated Creatinine Clearance: 52.7 mL/min (by C-G formula based on SCr of 0.8 mg/dL).  Assessment  Michelle Barnett is a 63 y.o. female presenting with respiratory failure. PMH significant for hypertension, hyperlipidemia, iron deficiency anemia . Pharmacy has been consulted to monitor and replace electrolytes.  MIVF: NA Pertinent medications: amio,   Goal of Therapy: Electrolytes within normal limits (K+ >4)  Plan:  KCL 10 mEq IV x 2.   F/u with AM labs.   Thank you for allowing pharmacy to be a part of this patient's care.  Ronnald Ramp, PharmD 02/13/2024 9:51 AM

## 2024-02-14 DIAGNOSIS — J189 Pneumonia, unspecified organism: Secondary | ICD-10-CM | POA: Diagnosis not present

## 2024-02-14 LAB — GLUCOSE, CAPILLARY
Glucose-Capillary: 78 mg/dL (ref 70–99)
Glucose-Capillary: 152 mg/dL — ABNORMAL HIGH (ref 70–99)

## 2024-02-14 LAB — CBC
HCT: 32.5 % — ABNORMAL LOW (ref 36.0–46.0)
Hemoglobin: 10.3 g/dL — ABNORMAL LOW (ref 12.0–15.0)
MCH: 30.9 pg (ref 26.0–34.0)
MCHC: 31.7 g/dL (ref 30.0–36.0)
MCV: 97.6 fL (ref 80.0–100.0)
Platelets: 160 10*3/uL (ref 150–400)
RBC: 3.33 MIL/uL — ABNORMAL LOW (ref 3.87–5.11)
RDW: 14.6 % (ref 11.5–15.5)
WBC: 12.5 10*3/uL — ABNORMAL HIGH (ref 4.0–10.5)
nRBC: 0 % (ref 0.0–0.2)

## 2024-02-14 LAB — CSF CULTURE W GRAM STAIN
Culture: NO GROWTH
Gram Stain: NONE SEEN

## 2024-02-14 LAB — BASIC METABOLIC PANEL
Anion gap: 8 (ref 5–15)
BUN: 24 mg/dL — ABNORMAL HIGH (ref 8–23)
CO2: 32 mmol/L (ref 22–32)
Calcium: 8.7 mg/dL — ABNORMAL LOW (ref 8.9–10.3)
Chloride: 104 mmol/L (ref 98–111)
Creatinine, Ser: 0.82 mg/dL (ref 0.44–1.00)
GFR, Estimated: >60 mL/min (ref >60)
Glucose, Bld: 86 mg/dL (ref 70–99)
Potassium: 4.2 mmol/L (ref 3.5–5.1)
Sodium: 144 mmol/L (ref 135–145)

## 2024-02-14 LAB — MAGNESIUM: Magnesium: 1.9 mg/dL (ref 1.7–2.4)

## 2024-02-14 LAB — VDRL, CSF: VDRL Quant, CSF: NONREACTIVE

## 2024-02-14 LAB — PHOSPHORUS: Phosphorus: 2.4 mg/dL — ABNORMAL LOW (ref 2.5–4.6)

## 2024-02-14 MED ORDER — GUAIFENESIN ER 600 MG PO TB12
600.0000 mg | ORAL_TABLET | Freq: Two times a day (BID) | ORAL | Status: DC
  Administered 2024-02-14 – 2024-02-19 (×12): 600 mg via ORAL
  Filled 2024-02-14 (×11): qty 1

## 2024-02-14 MED ORDER — MAGNESIUM SULFATE IN D5W 1-5 GM/100ML-% IV SOLN
1.0000 g | Freq: Once | INTRAVENOUS | Status: AC
  Administered 2024-02-14: 1 g via INTRAVENOUS
  Filled 2024-02-14: qty 100

## 2024-02-14 MED ORDER — ADULT MULTIVITAMIN W/MINERALS CH
1.0000 | ORAL_TABLET | Freq: Every day | ORAL | Status: DC
  Administered 2024-02-14 – 2024-02-25 (×12): 1 via ORAL
  Filled 2024-02-14 (×12): qty 1

## 2024-02-14 MED ORDER — K PHOS MONO-SOD PHOS DI & MONO 155-852-130 MG PO TABS
500.0000 mg | ORAL_TABLET | Freq: Once | ORAL | Status: AC
  Administered 2024-02-14: 500 mg via ORAL
  Filled 2024-02-14: qty 2

## 2024-02-14 MED ORDER — HYDROCOD POLI-CHLORPHE POLI ER 10-8 MG/5ML PO SUER
5.0000 mL | Freq: Two times a day (BID) | ORAL | Status: DC | PRN
  Administered 2024-02-18 – 2024-02-19 (×2): 5 mL via ORAL
  Filled 2024-02-14 (×2): qty 5

## 2024-02-14 MED ORDER — ENSURE ENLIVE PO LIQD
237.0000 mL | Freq: Three times a day (TID) | ORAL | Status: DC
  Administered 2024-02-14 – 2024-02-24 (×18): 237 mL via ORAL

## 2024-02-14 NOTE — Evaluation (Signed)
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   Pneumonia 02/04/2024   Hiatal hernia 02/03/2024   Sepsis (HCC) 02/02/2024   Paroxysmal atrial fibrillation (HCC) 02/02/2024   Atrial fibrillation with RVR (HCC) 02/02/2024   Head injury 02/01/2024   Hypoxia 02/01/2024   Pneumonia of right lower lobe due to infectious organism 02/01/2024   AKI (acute kidney injury) (HCC) 02/01/2024   Ecchymosis of left eye 02/01/2024   Rash and nonspecific skin eruption 09/09/2023   Bilateral lower extremity edema 09/09/2023   Hyperlipidemia 05/19/2022   Acute cough 05/19/2022   Murmur 05/21/2021   GERD (gastroesophageal reflux disease) 11/08/2017   Mild intermittent asthma without complication 11/08/2017   Demand ischemia (HCC) 09/20/2016   Bicuspid aortic valve 09/20/2016   Acute hypoxic respiratory failure (HCC) 09/19/2016   Preventative health care 06/15/2016   Obesity 01/27/2016   Anemia, iron deficiency 07/30/2014   Essential hypertension 07/30/2014   COPD, severity to be determined (HCC) 07/30/2014   Vitamin B12 deficiency 07/30/2014   Adjustment disorder with mixed anxiety and depressed mood 07/30/2014   Past Medical History:  Past Medical History:  Diagnosis Date   Asthma    Bicuspid aortic valve 09/20/2016   a. echo 09/19/16: EF 55-60%, GR1DD, possible bicuspid aortic valve without evidence of AS   Bronchitis    Coronary artery disease, non-occlusive    a. cath 09/21/16: ostLM to LM 20%, no evidence of aortic stenosis   Demand ischemia (HCC) 09/20/2016   Heart murmur    Hiatal hernia    History of blood transfusion    Hypertension    Persistent cough for 3 weeks or longer  05/19/2022   Ulcer    Past Surgical History:  Past Surgical History:  Procedure Laterality Date   abdominal tumor     ABLATION     CARDIAC CATHETERIZATION N/A 09/21/2016   Procedure: Left Heart Cath and Coronary Angiography;  Surgeon: Iran Ouch, MD;  Location: [facility] INVASIVE CV LAB;  Service: Cardiovascular;  Laterality: N/A;   COLONOSCOPY N/A 08/14/2016   Procedure: COLONOSCOPY;  Surgeon: Sherrilyn Rist, MD;  Location: [facility] ENDOSCOPY;  Service: Endoscopy;  Laterality: N/A;   ESOPHAGOGASTRODUODENOSCOPY N/A 08/13/2016   Procedure: ESOPHAGOGASTRODUODENOSCOPY (EGD);  Surgeon: Sherrilyn Rist, MD;  Location: [facility] ENDOSCOPY;  Service: Gastroenterology;  Laterality: N/A;   TOOTH EXTRACTION     HPI:  Michelle Barnett is 63 y.o. female with hypertension, hyperlipidemia, iron deficiency anemia, who presents to the ED with acute hypoxia, scabbing of lips.  Patient was previously in her PCP office where she was noted to be 66% on room air, this improved on 3 L O2 but could not get higher than 88%.  EMS was called and brought the patient to the hospital.  In the ED she was found to be septic with a temperature of 100.7, respirations 20, tachycardic to 113 and hypoxic.  Labs were mostly unrevealing.  Patient underwent CT head, CT cervical spine, CT maxillofacial which revealed soft tissue swelling of the periorbital soft tissues of the left and along the left frontal scalp.  She received DuoNebs, azithromycin, ceftriaxone. Pt admitted for right middle lobe PNA with acute hypoxic respiratory failure. Pt is currently on 6L nasal canula.  Pt with chronic hiatal hernia. CXR 2/7: Ventilation not significantly changed since 02/01/2024. Veiling lung base opacity superimposed on large gastric hiatal hernia. Differential considerations remain pneumonia, atelectasis, pleural effusions. Chest CT 2/7: Small bilateral pleural effusions. Consolidation in both lower lobes concerning for pneumonia at admit.  Post admit per  chart notes:  Rapid response called due to witnessed Generalized Tonic Clonic seizure, self resolved.  Never lost pulse, however severely hypoxic, required emergent intubation and mechanical ventilation for airway protection.  2/13: No significant events noted overnight.  MRI Brain last night concerning for PRESS.  Lab unable to obtain blood draws, will place central line for access.  On minimal vent support, requiring minimal Levophed.  Will perform WUQ and SBT as able.  2/14: No events noted overnight.  Plan for LP today at bedside.  On minimal vent support, will plan for WUA/SBT following LP  2/15: Pt extubated to 3L O2 via nasal canula.   Assessment / Plan / Recommendation Clinical Impression  Pt seen for initiation of speech language evaluation s/p hypoxic event/seizure and concern for PRESS. Assessment consisted of initial potions of the Mini Mental State Examination (MMSE), dynamic assessment, and pt/family interview. Michelle Barnett, friend- who has lived with patient for ~6 years, present for duration of session. Per Michelle Barnett, pt was independent, driving, and working PTA. Initial portions for the MMSE (targeting orientation and visual discrimination) revealing deficits in orientation (specifically to place, date, and time) and visual perception for use of clock. Pt endorses using glasses at baseline which were not placed during session. Further assessment of MMSE limited by needs to draw blood. Informally, pt demonstrated intact sustained attention for completion of breakfast, intact simple auditory comprehension for one step commands in context, and single word spontaneous utterances. When prompted regarding history, noted long term memory concerns with need for clarification in regards to current employment. Further, pt indicated limit insight/recall for current hospitalization- verbal cues and orientation aids reinforced to aid concept of passed time. Overall, pt is more responsive compared to initial meeting on  2/10, with [facility] reporting increased interaction today compared to earlier in hospital stay. However, current presentation is varied from independent baseline. Recommend reinforcement of external aids for orientation, limiting distractions, providing repetition, and single step commands. SLP will continue to follow for further assessment- suspect follow up SLP services at discharge.   Pt also seen for dysphagia intervention targeting diet tolerance, compensatory strategies, and pt/friend education. Pt demonstrating complete, swift oral transport and clearance with pureed solids- pt endorsed liking trials. No overt or subtle s/sx pharyngeal dysphagia noted. No change to vocal quality across trials. Advanced solids held given pt reported preference for soft solids with continued labial sores/discomfort. Pt with continued challenge with oral acceptance with liquids, with reduced labial pull on straw. Therapist trialed use of straw syphon and bottle sips to aid labial closure and liquid administration. Pt holding bottle with hand over hand assist. Education shared with Michelle Barnett regarding rationale for facilitating pt to hold cup/bottle for increased engagement and rationale for general aspiration precautions (slow rate, small bites, monitor alertness). Michelle Barnett reported understanding. Recommend continued Dys 1; thin liquids with aspiration precautions. Facilitate pt holding cup for liquids. Assist with meals. SLP will continue to follow for dysphagia.     SLP Assessment  SLP Recommendation/Assessment: Patient needs continued Speech Lanaguage Pathology Services SLP Visit Diagnosis: Dysphagia, unspecified (R13.10);Cognitive communication deficit (R41.841)    Recommendations for follow up therapy are one component of a multi-disciplinary discharge planning process, led by the  attending physician.  Recommendations may be updated based on patient status, additional functional criteria and insurance authorization.    Follow  Up Recommendations  Follow physician's recommendations for discharge plan and follow up therapies    Assistance Recommended at Discharge  Frequent or constant Supervision/Assistance  Functional Status Assessment Patient has had a recent decline in their functional status and demonstrates the ability to make significant improvements in function in a reasonable and predictable amount of time.  Frequency and Duration min 2x/week  2 weeks      SLP Evaluation Cognition  Overall Cognitive Status: Impaired/Different from baseline Arousal/Alertness: Awake/alert Orientation Level: Oriented to person;Disoriented to time;Disoriented to place;Disoriented to situation Month: January Day of Week: Incorrect Attention: Sustained Sustained Attention: Appears intact Memory:  (needs further assessment) Awareness: Impaired Awareness Impairment: Intellectual impairment Problem Solving:  (needs further assessment) Executive Function:  (needs further assessment) Behaviors:  (none) Safety/Judgment:  (needs further assessment)       Comprehension  Auditory Comprehension Overall Auditory Comprehension: Appears within functional limits for tasks assessed Yes/No Questions: Not tested Commands: Within Functional Limits (single step; in context) Conversation: Simple Interfering Components:  (none) Visual Recognition/Discrimination Discrimination: Exceptions to [facility] Other Visual Recogniton/Discrimination Comments: challenge with reading clock despite visual cues- Michelle Barnett reported use of glasses at baseline Reading Comprehension Reading Status: Not tested    Expression Expression Primary Mode of Expression: Verbal Verbal Expression Overall Verbal Expression: Appears within functional limits for tasks assessed Written Expression Written Expression: Not tested   Oral / Motor  Oral Motor/Sensory Function Overall Oral Motor/Sensory Function: Generalized oral weakness (labial, not lingual, not impacting  speech intelligibility) Motor Speech Overall Motor Speech: Appears within functional limits for tasks assessed           [place] Breon Rehm Clapp, MS, CCC-SLP Speech Language Pathologist Rehab Services; [facility] [phone] (ascom)   [place] J Clapp 02/14/2024, 9:46 AM

## 2024-02-14 NOTE — Plan of Care (Signed)

## 2024-02-14 NOTE — Progress Notes (Signed)
Nutrition Follow-up  DOCUMENTATION CODES:   Not applicable  INTERVENTION:   -MVI with minerals daily -Ensure Enlive po TID, each supplement provides 350 kcal and 20 grams of protein  -Magic cup TID with meals, each supplement provides 290 kcal and 9 grams of protein   NUTRITION DIAGNOSIS:   Inadequate oral intake related to inability to eat (pt sedated and ventilated) as evidenced by NPO status.  Progressing; advanced to PO diet on 02/13/24  GOAL:   Patient will meet greater than or equal to 90% of their needs  Progressing   MONITOR:   PO intake, Supplement acceptance, Diet advancement  REASON FOR ASSESSMENT:   Ventilator    ASSESSMENT:   63 y/o female with h/o HTN, anxiety, depression, GERD, HLD, asthma, hiatal hernia, COPD, PUD, CAD and IDA who is admitted with PNA, HSV-1, AKI, seizures, A-fib RVR and AMS.  2/15- extubated 2/16- s/p BSE- advanced to dysphagia 1 diet with thin liquids  Reviewed I/O's: -921 ml x 24 hours and +8.6 L since admission  UOP: 1.4 L x 24 hours  Spoke with pt at bedside, who awoke when name was called. Pt reports feeling better and and is happy with her progress, especially being out of the ICU. Per pt, she shares that she is eating well, but often experiences pain when eating due to multiple sores around moth area and missing teeth. She is very appreciative of puree diet and reports this helps with ease of intake. She enjoys drinking and is amenable to supplements. Discussed importance of good meal and supplement intake to promote healing.   Medications reviewed and include vitamin B-12, miralax, keppra, and solu-medrol.   Labs reviewed: Phos: 2.4, CBGS: 74-120 (inpatient orders for glycemic control are none).    Diet Order:   Diet Order             DIET - DYS 1 Room service appropriate? Yes with Assist; Fluid consistency: Thin  Diet effective now                   EDUCATION NEEDS:   Education needs have been  addressed  Skin:  Skin Assessment: Reviewed RN Assessment  Last BM:  02/13/24  Height:   Ht Readings from Last 1 Encounters:  02/01/24 4\' 9"  (1.448 m)    Weight:   Wt Readings from Last 1 Encounters:  02/13/24 58.2 kg    Ideal Body Weight:  43 kg  BMI:  Body mass index is 27.77 kg/m.  Estimated Nutritional Needs:   Kcal:  1550-1750  Protein:  75-90 grams  Fluid:  > 1.5 L    Levada Schilling, RD, LDN, CDCES Registered Dietitian III Certified Diabetes Care and Education Specialist If unable to reach this RD, please use "RD Inpatient" group chat on secure chat between hours of 8am-4 pm daily

## 2024-02-14 NOTE — Evaluation (Addendum)
Physical Therapy Re-Evaluation Patient Details Name: Michelle Barnett MRN: [id number] DOB: 14-Oct-1961 Today's Date: 02/14/2024  History of Present Illness  Pt is a 63 y.o. female admitted following a fall at home with acute metabolic encephalopathy, acute hypoxic respiratory failure in the setting of CAP and acute asthma exacerbation, A.fib with RVR, AKI, anemia without obvious signs of bleeding, and HSV 1 infection. Course complicated on 02/09/24 by generalized tonic clonic seizure with severe hypoxia requiring intubation and mechanical ventilation for airway protection. Pt extubated 02/12/24.   Clinical Impression  Pt pleasantly confused but willing to participate throughout the session.  Pt required max multi-modal cuing for all commands and even so frequently was unable to comply. Pt required extensive physical assist with all functional tasks and with transfers was able to clear the surface of the bed but unable to come to full upright position or to advance either LE.  Pt reported no adverse symptoms during the session.  Pt will benefit from continued PT services upon discharge to safely address deficits listed in patient problem list for decreased caregiver assistance and eventual return to PLOF.       If plan is discharge home, recommend the following: Two people to help with walking and/or transfers;Two people to help with bathing/dressing/bathroom;Supervision due to cognitive status;Direct supervision/assist for medications management;Assistance with cooking/housework;Help with stairs or ramp for entrance   Can travel by private vehicle   No    Equipment Recommendations Other (comment) (TBD)  Recommendations for Other Services       Functional Status Assessment Patient has had a recent decline in their functional status and demonstrates the ability to make significant improvements in function in a reasonable and predictable amount of time.     Precautions / Restrictions  Precautions Precautions: Fall Recall of Precautions/Restrictions: Impaired Restrictions Weight Bearing Restrictions Per Provider Order: No      Mobility  Bed Mobility Overal bed mobility: Needs Assistance Bed Mobility: Supine to Sit, Sit to Supine     Supine to sit: +2 for physical assistance, Max assist Sit to supine: +2 for physical assistance, Max assist   General bed mobility comments: Pt put forth some effort but near total assist needed with all bed mobility tasks with extensive multi-modal cuing for sequencing    Transfers Overall transfer level: Needs assistance Equipment used: Rolling walker (2 wheels) Transfers: Sit to/from Stand Sit to Stand: Mod assist, +2 physical assistance           General transfer comment: Max multi-modal cuing for sequencing with heavy +2 assist to come to partial standing at the EOB    Ambulation/Gait               General Gait Details: Unable/unsafe to attempt  Stairs            Wheelchair Mobility     Tilt Bed    Modified Rankin (Stroke Patients Only)       Balance Overall balance assessment: Needs assistance Sitting-balance support: Bilateral upper extremity supported, Feet supported Sitting balance-Leahy Scale: Fair     Standing balance support: Bilateral upper extremity supported, Reliant on assistive device for balance Standing balance-Leahy Scale: Poor                               Pertinent Vitals/Pain Pain Assessment Pain Assessment: No/denies pain    Home Living Family/patient expects to be discharged to:: Private residence Living Arrangements: Spouse/significant other Available Help  at Discharge: Friend(s);Family;Available 24 hours/day Type of Home: House Home Access: Stairs to enter Entrance Stairs-Rails: Right;Left;Can reach both Entrance Stairs-Number of Steps: 5   Home Layout: One level Home Equipment: Shower seat Additional Comments: Poor historian, history and PLOF  primarily from boyfriend in the room    Prior Function Prior Level of Function : Independent/Modified Independent             Mobility Comments: Ind amb community distances without an AD, no fall history, works FT at a coffee shop in [place] ADLs Comments: Ind with ADLs     Extremity/Trunk Assessment   Upper Extremity Assessment Upper Extremity Assessment: Generalized weakness    Lower Extremity Assessment Lower Extremity Assessment: Generalized weakness       Communication   Communication Communication: Impaired    Cognition Arousal: Alert Behavior During Therapy: Flat affect   PT - Cognitive impairments: Orientation, Awareness, Memory, Attention, Initiation, Sequencing   Orientation impairments: Person                   PT - Cognition Comments: Pt unable to follow most commands or provide an accurate history Following commands: Impaired Following commands impaired: Follows one step commands inconsistently     Cueing Cueing Techniques: Verbal cues, Tactile cues, Visual cues, Gestural cues     General Comments      Exercises     Assessment/Plan    PT Assessment Patient needs continued PT services  PT Problem List Decreased strength;Decreased activity tolerance;Decreased balance;Decreased mobility;Decreased cognition;Decreased safety awareness;Decreased knowledge of use of DME       PT Treatment Interventions DME instruction;Gait training;Therapeutic activities;Therapeutic exercise;Stair training;Functional mobility training;Balance training;Patient/family education    PT Goals (Current goals can be found in the Care Plan section)  Acute Rehab PT Goals Patient Stated Goal: To get my strength back PT Goal Formulation: With patient Time For Goal Achievement: 02/27/24 Potential to Achieve Goals: Good    Frequency Min 1X/week     Co-evaluation               AM-PAC PT "6 Clicks" Mobility  Outcome Measure Help needed turning from your back  to your side while in a flat bed without using bedrails?: Total Help needed moving from lying on your back to sitting on the side of a flat bed without using bedrails?: Total Help needed moving to and from a bed to a chair (including a wheelchair)?: Total Help needed standing up from a chair using your arms (e.g., wheelchair or bedside chair)?: A Lot Help needed to walk in hospital room?: Total Help needed climbing 3-5 steps with a railing? : Total 6 Click Score: 7    End of Session Equipment Utilized During Treatment: Gait belt;Oxygen Activity Tolerance: Patient tolerated treatment well Patient left: in bed;with call bell/phone within reach;with bed alarm set;with family/visitor present;with nursing/sitter in room Nurse Communication: Mobility status PT Visit Diagnosis: Muscle weakness (generalized) (M62.81);Difficulty in walking, not elsewhere classified (R26.2);Unsteadiness on feet (R26.81)    Time: 1610-9604 PT Time Calculation (min) (ACUTE ONLY): 18 min   Charges:   PT Evaluation $PT Re-evaluation: 1 Re-eval   PT General Charges $$ ACUTE PT VISIT: 1 Visit       D. Scott Jefferey Lippmann PT, DPT 02/14/24, 11:12 AM

## 2024-02-14 NOTE — Plan of Care (Signed)
  Problem: Education: Goal: Knowledge of General Education information will improve Description: Including pain rating scale, medication(s)/side effects and non-pharmacologic comfort measures Outcome: Progressing   Problem: Health Behavior/Discharge Planning: Goal: Ability to manage health-related needs will improve Outcome: Progressing   Problem: Clinical Measurements: Goal: Ability to maintain clinical measurements within normal limits will improve Outcome: Progressing Goal: Will remain free from infection Outcome: Progressing Goal: Diagnostic test results will improve Outcome: Progressing Goal: Respiratory complications will improve Outcome: Progressing Goal: Cardiovascular complication will be avoided Outcome: Progressing   Problem: Activity: Goal: Risk for activity intolerance will decrease Outcome: Progressing   Problem: Nutrition: Goal: Adequate nutrition will be maintained Outcome: Progressing   Problem: Coping: Goal: Level of anxiety will decrease Outcome: Progressing   Problem: Elimination: Goal: Will not experience complications related to bowel motility Outcome: Progressing Goal: Will not experience complications related to urinary retention Outcome: Progressing   Problem: Pain Managment: Goal: General experience of comfort will improve and/or be controlled Outcome: Progressing   Problem: Safety: Goal: Ability to remain free from injury will improve Outcome: Progressing   Problem: Skin Integrity: Goal: Risk for impaired skin integrity will decrease Outcome: Progressing   Problem: Activity: Goal: Ability to tolerate increased activity will improve Outcome: Progressing   Problem: Clinical Measurements: Goal: Ability to maintain a body temperature in the normal range will improve Outcome: Progressing   Problem: Respiratory: Goal: Ability to maintain adequate ventilation will improve Outcome: Progressing Goal: Ability to maintain a clear airway  will improve Outcome: Progressing   Problem: Activity: Goal: Ability to tolerate increased activity will improve Outcome: Progressing   Problem: Respiratory: Goal: Ability to maintain a clear airway and adequate ventilation will improve Outcome: Progressing   Problem: Role Relationship: Goal: Method of communication will improve Outcome: Progressing

## 2024-02-14 NOTE — Evaluation (Signed)
Occupational Therapy Evaluation Patient Details Name: Michelle Barnett MRN: [id number] DOB: [birth year] Today's Date: 02/14/2024   History of Present Illness   Pt is a 63 y.o. female admitted following a fall at home with acute metabolic encephalopathy, acute hypoxic respiratory failure in the setting of CAP and acute asthma exacerbation, A.fib with RVR, AKI, anemia without obvious signs of bleeding, and HSV 1 infection. Course complicated on 02/09/24 by generalized tonic clonic seizure with severe hypoxia requiring intubation and mechanical ventilation for airway protection. Pt extubated 02/12/24.     Clinical Impressions Pt was seen for OT evaluation this date. Pt lethargic, responds to verbal stimuli but has difficult time maintaining alertness without constant stimulus. 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 She is oriented to self and year only. She requires multimodal cues to follow commands. Pt presents to acute OT demonstrating impaired ADL performance and functional mobility 2/2 decreased strength, cognition, balance, and  (See OT problem list for additional functional deficits). Pt currently requires heavy +2 assist for all bed mobility and MAX A for bed level bathing, dressing, and toileting. Pt required MAX A hand over hand assist to scoop food to bring to mouth, and MIN A for washing her face with a washcloth. Pt would benefit from skilled OT services to address noted impairments and functional limitations (see below for any additional details) in order to maximize safety and independence while minimizing falls risk and caregiver burden.    If plan is discharge home, recommend the following:   Two people to help with walking and/or transfers;Two people to help with bathing/dressing/bathroom;Assist for transportation;Help with stairs or ramp for entrance;Direct supervision/assist for financial management;Supervision due to cognitive  status;Direct supervision/assist for medications management;Assistance with cooking/housework     Functional Status Assessment   Patient has had a recent decline in their functional status and demonstrates the ability to make significant improvements in function in a reasonable and predictable amount of time.     Equipment Recommendations   Other (comment) (defer)     Recommendations for Other Services         Precautions/Restrictions   Precautions Precautions: Fall Recall of Precautions/Restrictions: Impaired Restrictions Weight Bearing Restrictions Per Provider Order: No     Mobility Bed Mobility Overal bed mobility: Needs Assistance Bed Mobility: Rolling Rolling: Total assist, +2 for physical assistance              Transfers                   General transfer comment: deferred      Balance                                           ADL either performed or assessed with clinical judgement   ADL Overall ADL's : Needs assistance/impaired Eating/Feeding: Maximal assistance;Bed level Eating/Feeding Details (indicate cue type and reason): long sitting in bed, MAX A hand over hand but pt ultimately declined bite offered and did not attempt to self feed despite optimizing set up Grooming: Bed level;Minimal assistance;Wash/dry Industrial/product designer  Praxis         Pertinent Vitals/Pain Pain Assessment Pain Assessment: No/denies pain     Extremity/Trunk Assessment Upper Extremity Assessment Upper Extremity Assessment: Generalized weakness (L>R edema)   Lower Extremity Assessment Lower Extremity Assessment: Generalized weakness       Communication Communication Communication: Impaired   Cognition Arousal: Lethargic Behavior During Therapy: Flat affect Cognition: Difficult to assess Difficult to assess due to: Level of arousal            OT - Cognition Comments: oriented to self, year, requires max multimodal cues                 Following commands: Impaired Following commands impaired: Follows one step commands inconsistently     Cueing  General Comments   Cueing Techniques: Verbal cues;Tactile cues;Visual cues;Gestural cues      Exercises     Shoulder Instructions      Home Living Family/patient expects to be discharged to:: Private residence Living Arrangements: Spouse/significant other Available Help at Discharge: Friend(s);Family;Available 24 hours/day Type of Home: House Home Access: Stairs to enter [employer] of Steps: 5 Entrance Stairs-Rails: Right;Left;Can reach both Home Layout: One level     Bathroom Shower/Tub: Producer, television/film/video: Standard     Home Equipment: Information systems manager   Additional Comments: Poor historian, history and PLOF primarily from boyfriend in the room  Lives With: Other (Comment) (boyfriend)    Prior Functioning/Environment Prior Level of Function : Independent/Modified Independent             Mobility Comments: Ind amb community distances without an AD, no fall history, works FT at a coffee shop in [place] ADLs Comments: Ind with ADLs    OT Problem List: Decreased strength;Impaired balance (sitting and/or standing);Decreased cognition;Decreased activity tolerance   OT Treatment/Interventions: Self-care/ADL training;Therapeutic exercise;Patient/family education;Balance training;Therapeutic activities;Energy conservation      OT Goals(Current goals can be found in the care plan section)   Acute Rehab OT Goals Patient Stated Goal: get better OT Goal Formulation: With patient Time For Goal Achievement: 02/28/24 Potential to Achieve Goals: Fair ADL Goals Pt Will Perform Grooming: with min assist;sitting Pt Will Perform Upper Body Bathing: sitting;with min assist;with mod assist Pt Will Transfer to Toilet: with max assist;with mod  assist;bedside commode;stand pivot transfer   OT Frequency:  Min 1X/week    Co-evaluation              AM-PAC OT "6 Clicks" Daily Activity     Outcome Measure Help from another person eating meals?: A Lot Help from another person taking care of personal grooming?: A Little Help from another person toileting, which includes using toliet, bedpan, or urinal?: Total Help from another person bathing (including washing, rinsing, drying)?: Total Help from another person to put on and taking off regular upper body clothing?: A Lot Help from another person to put on and taking off regular lower body clothing?: Total 6 Click Score: 10   End of Session Equipment Utilized During Treatment: Oxygen  Activity Tolerance: Patient limited by lethargy Patient left: in bed;with call bell/phone within reach;with bed alarm set  OT Visit Diagnosis: Other abnormalities of gait and mobility (R26.89);Muscle weakness (generalized) (M62.81)                Time: 1254-1310 OT Time Calculation (min): 16 min Charges:  OT General Charges $OT Visit: 1 Visit OT Evaluation $OT Eval Low Complexity: 1 Low  Arman Filter., MPH, MS, OTR/L ascom [phone] 02/14/24,  2:13 PM

## 2024-02-14 NOTE — Consult Note (Signed)
PHARMACY CONSULT NOTE - ELECTROLYTES  Pharmacy Consult for Electrolyte Monitoring and Replacement   Recent Labs: Potassium (mmol/L)  Date Value  02/14/2024 4.2  12/17/2014 3.6   Magnesium (mg/dL)  Date Value  16/09/9603 1.9  11/01/2013 2.6 (H)   Calcium (mg/dL)  Date Value  54/08/8118 8.7 (L)   Calcium, Total (mg/dL)  Date Value  14/78/2956 9.1   Albumin (g/dL)  Date Value  21/30/8657 2.6 (L)  04/27/2013 3.1 (L)   Phosphorus (mg/dL)  Date Value  84/69/6295 2.4 (L)   Sodium (mmol/L)  Date Value  02/14/2024 144  09/28/2016 143  12/17/2014 140   Height: 4\' 9"  (144.8 cm) Weight: 58.2 kg (128 lb 4.9 oz) IBW/kg (Calculated) : 38.6 Estimated Creatinine Clearance: 51.4 mL/min (by C-G formula based on SCr of 0.82 mg/dL).  Assessment  Michelle Barnett is a 63 y.o. female presenting with respiratory failure. PMH significant for hypertension, hyperlipidemia, iron deficiency anemia . Pharmacy has been consulted to monitor and replace electrolytes.  MIVF: NA Pertinent medications: amio,   Goal of Therapy: Electrolytes within normal limits (K+ >4)  Plan:  Mag 1.9   Will order Magnesium sulfate 1 gram IV x1 - pt w/ Afib Phos 2.4   Will order KPhos 500mg  po x 1 dose  Pharmacy will sign off of CCM monitoring consult as patient has been transferred out of ICU. May reconsult as needed  Thank you for allowing pharmacy to be a part of this patient's care.  Bari Mantis PharmD Clinical Pharmacist 02/14/2024

## 2024-02-14 NOTE — Progress Notes (Signed)
[facility] Progress Note  Patient: Michelle Barnett    WRU:[id number]  DOA: 02/01/2024     Date of Service: the patient was seen and examined on 02/14/2024  Chief Complaint  Patient presents with   Respiratory Distress   Brief hospital course: Michelle Barnett is 63 y.o. female with hypertension, hyperlipidemia, iron deficiency anemia, who presented to the ED on 02/01/2024 with acute hypoxia. Pt is currently intubated, sedated, and unable to contribute to history and no family is currently available, therefore history was obtained per chart review.  patient was previously in her PCP office where she was noted to be 66% on room air, this improved on 3 L O2 but could not get higher than 88%.  EMS was called and brought to the patient to the hospital.  In the ED she was found to be septic with a temperature of 100.7, respirations 20, tachycardic to 113 and hypoxic.  Labs were mostly unrevealing.  Patient underwent CT head, CT cervical spine, CT maxillofacial which revealed soft tissue swelling of the periorbital soft tissues of the left and along the left frontal scalp.  She received DuoNebs, azithromycin, ceftriaxone.  Hospital stay has been prolonged by ongoing hypoxia, inability to wean oxygen, and persistent delirium.  Patient has now completed her course of antibiotics.  We have initiated steroid taper, but she remains on 6 L nasal cannula.   Patient's stay was also complicated by A-fib RVR necessitating cardiology consult, amiodarone drip and heparin drip which have since been transitioned to Eliquis and amiodarone p.o.  A-fib RVR likely provoked by hypoxia and breathing treatments. Patient stay is also been complicated by lip lesions which were initially thought to be impetigo but later resulted positive for HSV.  She has had some improvement with mupirocin and was initiated on valacyclovir."  2/4: Admitted by [facility]. 2/12: Rapid response called due to witnessed Generalized Tonic Clonic seizure,  self resolved.  Never lost pulse, however severely hypoxic, required emergent intubation and mechanical ventilation for airway protection. 2/13: No significant events noted overnight.  MRI Brain last night concerning for PRESS.  Lab unable to obtain blood draws, will place central line for access.  On minimal vent support, requiring minimal Levophed.  Will perform WUQ and SBT as able. 2/14: No events noted overnight.  Plan for LP today at bedside.  On minimal vent support, will plan for WUA/SBT following LP 2/15: Pt extubated to 3L O2 via nasal canula.  Pending speech/PT/OT   2/16 patient was transferred to [facility] service.  Please review previous notes for details, and further management as below.   Assessment and Plan:  63 y.o. female admitted following a fall at home with Acute Metabolic Encephalopathy, Acute Hypoxic Respiratory Failure in the setting of Community Acquired Pneumonia and Acute Asthma Exacerbation, A.fib with RVR, Acute Kidney Injury, and HSV 1 infection. Course complicated on 02/09/24 by Generalized Tonic Clonic Seizure with severe hypoxia requiring intubation and mechanical ventilation for airway protection.    # Generalized tonic clonic seizure~resolved # Acute metabolic encephalopathy~resolved  # Concern for PRESS MRI Brain consistent with PRESS EEG no PLEDS - Treat metabolic derangements  - Avoid sedating medications as able - Continue keppra 500 mg BID dosing - Meningitis/encephalitis panel 2/14: negative  - Neurology consulted: - D/c acyclovir, less likely herpes encephalitis as per ID - Resume eliquis if no contraindication - Continue keppra 500mg  bid. It is possible that this may be weaned in several mos as an outpatient, since PRES is by  definition not a permanent condition. - Neuro will arrange for outpatient neurology f/u - Seizure precautions incl no driving x6 mos after last seizure - PT/OT/Speech consulted, started pured dysphagia 1 diet   #Acute hypoxic  respiratory failure in the setting of CAP, pulmonary edema, and asthma exacerbation - Supplemental O2 for dyspnea and/or hypoxia  - Maintain O2 sats 92% or higher  - Prn bronchodilator therapy  - Continue iv steroid taper  2/17 Mucinex extended milligram p.o. twice daily, Tussionex prn for cough  #Atrial fibrillation with rvr~ currently in NSR #HTN  # Hx: CAD and bicuspid aortic valve  # Valvular abnormality, mitral, tricuspid and aortic valves as below. Echocardiogram 02/02/24: LVEF 55-60%. Normal diastolic parameters. RV systolic function is normal.  RV is moderately enlarged, moderate MR, moderate TR, moderate AS - Continuous telemetry monitoring  - Once able to tolerate po's resume amiodarone, atorvastatin, and ezetimibe  - Prn labetalol and/or hydralazine for bp management  2/16 started metoprolol 50 mg p.o. twice daily  #HSV-1 Infection~improving  #CAP~treated  - Trend WBC and monitor fever curve  - Follow culture results  S/p acyclovir, CSF negative so it has been discontinued. Started Valtrex 1000 mg twice daily for 2 days ID consulted, recommended antiviral therapy for 7 days   #Acute kidney Injury~resolved  #Metabolic alkalosis, suspect contraction in setting of diuresis~improving  - Trend BMP  - Strict I&O's - Avoid nephrotoxic agents as able - Replace electrolytes as indicated~pharmacy following for assistance with electrolyte replacement   #Anemia without obvious signs of bleeding  - Trend CBC  - Monitor for s/sx of bleeding  - Transfuse for hgb <7 Anemia workup within normal range   Body mass index is 27.77 kg/m.  Nutrition Problem: Inadequate oral intake Etiology: inability to eat (pt sedated and ventilated) Interventions:  Diet: Pured dysphagia 1 diet DVT Prophylaxis: Eliquis  Advance goals of care discussion: Full code  Family Communication: family was present at bedside, at the time of interview.  The pt provided permission to discuss medical plan  with the family. Opportunity was given to ask question and all questions were answered satisfactorily.   Disposition:  Pt is from Home, admitted with Seizures, Resp Failure, still has Resp Failure, which precludes a safe discharge. Discharge to Home with HH, when stable, may need few days more to improve.  Subjective: No significant overnight events, patient was sleepy, AO x 1, confused. States that she is feeling fine, Patient had weak cough with some phlegm production, and chest congestion. Denied any specific complaints.   Physical Exam: General: NAD, lying comfortably Appear in no distress, affect appropriate Eyes: PERRLA ENT: Oral Mucosa Clear, moist, perioral crusting due to herpes Neck: no JVD,  Cardiovascular: S1 and S2 Present, no Murmur,  Respiratory: Equal air entry bilaterally, mild bibasilar crackles, no wheezes. Abdomen: Bowel Sound present, Soft and no tenderness,  Skin: no rashes Extremities: no Pedal edema, no calf tenderness, mild edema of bilateral hands Neurologic: without any new focal findings Gait not checked due to patient safety concerns  Vitals:   02/13/24 1545 02/13/24 1637 02/13/24 2235 02/14/24 0758  BP: (!) 168/93 (!) 155/69 137/63 126/66  Pulse: (!) 103 (!) 108 78 60  Resp: 20  16 16   Temp: 97.8 F (36.6 C)  (!) 97.5 F (36.4 C) 98 F (36.7 C)  TempSrc:      SpO2: 93%  100% 100%  Weight:      Height:        Intake/Output Summary (Last  24 hours) at 02/14/2024 1326 Last data filed at 02/14/2024 1110 Gross per 24 hour  Intake 398.04 ml  Output 1350 ml  Net -951.96 ml   Filed Weights   02/11/24 0330 02/12/24 0330 02/13/24 0412  Weight: 57.4 kg 58.3 kg 58.2 kg    Data Reviewed: I have personally reviewed and interpreted daily labs, tele strips, imagings as discussed above. I reviewed all nursing notes, pharmacy notes, vitals, pertinent old records I have discussed plan of care as described above with RN and  patient/family.  CBC: Recent Labs  Lab 02/09/24 0438 02/10/24 1300 02/11/24 0404 02/12/24 0406 02/13/24 0424 02/14/24 0923  WBC 5.8 7.5 11.2* 7.9 7.5 12.5*  NEUTROABS 4.6 5.6 8.8* 5.8 5.5  --   HGB 11.9* 9.7* 9.9* 8.9* 8.9* 10.3*  HCT 38.1 29.5* 31.3* 27.7* 27.7* 32.5*  MCV 98.7 94.2 95.7 96.2 96.9 97.6  PLT 231 200 204 166 145* 160   Basic Metabolic Panel: Recent Labs  Lab 02/10/24 1300 02/11/24 0404 02/12/24 0406 02/13/24 0424 02/14/24 0923  NA 143 141 141 146* 144  K 3.0* 3.7 3.7 3.9 4.2  CL 98 95* 101 104 104  CO2 37* 36* 34* 32 32  GLUCOSE 132* 91 86 86 86  BUN 38* 35* 30* 24* 24*  CREATININE 1.00 1.00 0.93 0.80 0.82  CALCIUM 8.2* 8.3* 8.1* 8.5* 8.7*  MG 1.7 1.6* 2.3 2.1 1.9  PHOS 2.5 3.1 3.3 2.7 2.4*    Studies: No results found.  Scheduled Meds:  amiodarone  200 mg Oral BID   apixaban  5 mg Oral BID   atorvastatin  40 mg Oral QHS   Chlorhexidine Gluconate Cloth  6 each Topical Daily   cyanocobalamin  1,000 mcg Oral Daily   ezetimibe  10 mg Oral Daily   guaiFENesin  600 mg Oral BID   methylPREDNISolone (SOLU-MEDROL) injection  20 mg Intravenous Daily   Followed by   Melene Muller ON 02/17/2024] methylPREDNISolone (SOLU-MEDROL) injection  10 mg Intravenous Daily   metoprolol tartrate  50 mg Oral BID   mupirocin ointment   Nasal BID   mouth rinse  15 mL Mouth Rinse 4 times per day   pantoprazole (PROTONIX) IV  40 mg Intravenous Daily   phosphorus  500 mg Oral Once   polyethylene glycol  17 g Per Tube Daily   sodium chloride flush  10-40 mL Intracatheter Q12H   thiamine (VITAMIN B1) injection  100 mg Intravenous Daily   valACYclovir  1,000 mg Oral BID   Continuous Infusions:  levETIRAcetam 500 mg (02/14/24 0536)   magnesium sulfate bolus IVPB     PRN Meds: acetaminophen, chlorpheniramine-HYDROcodone, hydrALAZINE, HYDROcodone-acetaminophen, ipratropium-albuterol, labetalol, mouth rinse, mouth rinse, senna-docusate, sodium chloride flush  Time spent: 55  minutes  Author: Gillis Santa. MD [facility] 02/14/2024 1:26 PM  To reach On-call, see care teams to locate the attending and reach out to them via [email or web address]. If 7PM-7AM, please contact night-coverage If you still have difficulty reaching the attending provider, please page the [facility] (Director on Call) for [facility] on amion for assistance.

## 2024-02-15 DIAGNOSIS — J9602 Acute respiratory failure with hypercapnia: Secondary | ICD-10-CM | POA: Diagnosis not present

## 2024-02-15 DIAGNOSIS — J189 Pneumonia, unspecified organism: Secondary | ICD-10-CM | POA: Diagnosis not present

## 2024-02-15 DIAGNOSIS — J9601 Acute respiratory failure with hypoxia: Secondary | ICD-10-CM | POA: Diagnosis not present

## 2024-02-15 DIAGNOSIS — G9340 Encephalopathy, unspecified: Secondary | ICD-10-CM | POA: Diagnosis not present

## 2024-02-15 LAB — BASIC METABOLIC PANEL
Anion gap: 7 (ref 5–15)
BUN: 32 mg/dL — ABNORMAL HIGH (ref 8–23)
CO2: 35 mmol/L — ABNORMAL HIGH (ref 22–32)
Calcium: 8.9 mg/dL (ref 8.9–10.3)
Chloride: 106 mmol/L (ref 98–111)
Creatinine, Ser: 0.81 mg/dL (ref 0.44–1.00)
GFR, Estimated: >60 mL/min (ref >60)
Glucose, Bld: 104 mg/dL — ABNORMAL HIGH (ref 70–99)
Potassium: 4.1 mmol/L (ref 3.5–5.1)
Sodium: 148 mmol/L — ABNORMAL HIGH (ref 135–145)

## 2024-02-15 LAB — CBC
HCT: 28.2 % — ABNORMAL LOW (ref 36.0–46.0)
Hemoglobin: 8.8 g/dL — ABNORMAL LOW (ref 12.0–15.0)
MCH: 31 pg (ref 26.0–34.0)
MCHC: 31.2 g/dL (ref 30.0–36.0)
MCV: 99.3 fL (ref 80.0–100.0)
Platelets: 167 10*3/uL (ref 150–400)
RBC: 2.84 MIL/uL — ABNORMAL LOW (ref 3.87–5.11)
RDW: 14.5 % (ref 11.5–15.5)
WBC: 8.4 10*3/uL (ref 4.0–10.5)
nRBC: 0 % (ref 0.0–0.2)

## 2024-02-15 LAB — PHOSPHORUS: Phosphorus: 3.7 mg/dL (ref 2.5–4.6)

## 2024-02-15 LAB — GLUCOSE, CAPILLARY
Glucose-Capillary: 90 mg/dL (ref 70–99)
Glucose-Capillary: 135 mg/dL — ABNORMAL HIGH (ref 70–99)

## 2024-02-15 LAB — MAGNESIUM: Magnesium: 2.1 mg/dL (ref 1.7–2.4)

## 2024-02-15 MED ORDER — THIAMINE MONONITRATE 100 MG PO TABS
100.0000 mg | ORAL_TABLET | Freq: Every day | ORAL | Status: DC
  Administered 2024-02-15 – 2024-02-25 (×11): 100 mg via ORAL
  Filled 2024-02-15 (×11): qty 1

## 2024-02-15 MED ORDER — LEVETIRACETAM 500 MG PO TABS
500.0000 mg | ORAL_TABLET | Freq: Two times a day (BID) | ORAL | Status: DC
  Administered 2024-02-15: 500 mg via ORAL
  Filled 2024-02-15 (×2): qty 1

## 2024-02-15 MED ORDER — POLYETHYLENE GLYCOL 3350 17 G PO PACK
17.0000 g | PACK | Freq: Every day | ORAL | Status: DC
Start: 2024-02-15 — End: 2024-02-26
  Administered 2024-02-15 – 2024-02-24 (×8): 17 g via ORAL
  Filled 2024-02-15 (×10): qty 1

## 2024-02-15 NOTE — Progress Notes (Signed)
Physical Therapy Treatment Patient Details Name: Michelle Barnett MRN: [id number] DOB: 1961/09/29 Today's Date: 02/15/2024   History of Present Illness Pt is a 63 y.o. female admitted following a fall at home with acute metabolic encephalopathy, acute hypoxic respiratory failure in the setting of CAP and acute asthma exacerbation, A.fib with RVR, AKI, anemia without obvious signs of bleeding, and HSV 1 infection. Course complicated on 02/09/24 by generalized tonic clonic seizure with severe hypoxia requiring intubation and mechanical ventilation for airway protection. Pt extubated 02/12/24.    PT Comments  In bed, ready for session.  Supine AAROM x 10 with varied engagement in exercises.  Did do better with L vs R side.  She is able to attempt to initiate transition to sitting but needs max a x 1 to transition.  She does sit x 5 minutes EOB with cga/min a x 1 for general safety as she did sway a bit in sitting leaning forward.  She fatigues and asks to return to supine with max a x 1 and repositioned for comfort.  Generally fatigued with activity.  Some time spent washing dried blood off her face where sores are not present.     If plan is discharge home, recommend the following: Two people to help with walking and/or transfers;Two people to help with bathing/dressing/bathroom;Supervision due to cognitive status;Direct supervision/assist for medications management;Assistance with cooking/housework;Help with stairs or ramp for entrance   Can travel by private vehicle        Equipment Recommendations       Recommendations for Other Services       Precautions / Restrictions Precautions Precautions: Fall Recall of Precautions/Restrictions: Impaired Restrictions Weight Bearing Restrictions Per Provider Order: No     Mobility  Bed Mobility Overal bed mobility: Needs Assistance Bed Mobility: Supine to Sit, Sit to Supine Rolling: Max assist   Supine to sit: Max assist Sit to supine: Max  assist   General bed mobility comments: Pt put forth some effort but near total assist needed with all bed mobility tasks with extensive multi-modal cuing for sequencing Patient Response: Cooperative  Transfers                   General transfer comment: deferred    Ambulation/Gait               General Gait Details: Unable/unsafe to attempt   Stairs             Wheelchair Mobility     Tilt Bed Tilt Bed Patient Response: Cooperative  Modified Rankin (Stroke Patients Only)       Balance Overall balance assessment: Needs assistance Sitting-balance support: Bilateral upper extremity supported, Feet supported Sitting balance-Leahy Scale: Poor Sitting balance - Comments: +1 assist for safety but is able to hold her balance for short periods of time with supervision.   Standing balance support: Bilateral upper extremity supported, Reliant on assistive device for balance   Standing balance comment: NT                            Communication Communication Communication: Impaired  Cognition Arousal: Alert Behavior During Therapy: Flat affect, WFL for tasks assessed/performed   PT - Cognitive impairments: Awareness, Difficult to assess, Safety/Judgement Difficult to assess due to: Impaired communication                       Following commands: Impaired Following commands impaired: Follows one  step commands inconsistently    Cueing Cueing Techniques: Verbal cues, Tactile cues, Visual cues, Gestural cues  Exercises      General Comments        Pertinent Vitals/Pain Pain Assessment Pain Assessment: No/denies pain    Home Living                          Prior Function            PT Goals (current goals can now be found in the care plan section) Progress towards PT goals: Progressing toward goals    Frequency    Min 1X/week      PT Plan      Co-evaluation              AM-PAC PT "6 Clicks"  Mobility   Outcome Measure  Help needed turning from your back to your side while in a flat bed without using bedrails?: Total Help needed moving from lying on your back to sitting on the side of a flat bed without using bedrails?: Total Help needed moving to and from a bed to a chair (including a wheelchair)?: Total Help needed standing up from a chair using your arms (e.g., wheelchair or bedside chair)?: A Lot Help needed to walk in hospital room?: Total Help needed climbing 3-5 steps with a railing? : Total 6 Click Score: 7    End of Session Equipment Utilized During Treatment: Gait belt;Oxygen Activity Tolerance: Patient tolerated treatment well Patient left: in bed;with call bell/phone within reach;with bed alarm set;with nursing/sitter in room Nurse Communication: Mobility status PT Visit Diagnosis: Muscle weakness (generalized) (M62.81);Difficulty in walking, not elsewhere classified (R26.2);Unsteadiness on feet (R26.81)     Time: 4166-0630 PT Time Calculation (min) (ACUTE ONLY): 15 min  Charges:    $Therapeutic Activity: 8-22 mins PT General Charges $$ ACUTE PT VISIT: 1 Visit                    Danielle Dess, PTA 02/15/24, 1:58 PM

## 2024-02-15 NOTE — Plan of Care (Signed)
  Problem: Education: Goal: Knowledge of General Education information will improve Description: Including pain rating scale, medication(s)/side effects and non-pharmacologic comfort measures Outcome: Progressing   Problem: Health Behavior/Discharge Planning: Goal: Ability to manage health-related needs will improve Outcome: Progressing   Problem: Clinical Measurements: Goal: Ability to maintain clinical measurements within normal limits will improve Outcome: Progressing Goal: Will remain free from infection Outcome: Progressing Goal: Diagnostic test results will improve Outcome: Progressing Goal: Respiratory complications will improve Outcome: Progressing Goal: Cardiovascular complication will be avoided Outcome: Progressing   Problem: Activity: Goal: Risk for activity intolerance will decrease Outcome: Progressing   Problem: Nutrition: Goal: Adequate nutrition will be maintained Outcome: Progressing   Problem: Coping: Goal: Level of anxiety will decrease Outcome: Progressing   Problem: Elimination: Goal: Will not experience complications related to bowel motility Outcome: Progressing Goal: Will not experience complications related to urinary retention Outcome: Progressing   Problem: Pain Managment: Goal: General experience of comfort will improve and/or be controlled Outcome: Progressing   Problem: Safety: Goal: Ability to remain free from injury will improve Outcome: Progressing   Problem: Skin Integrity: Goal: Risk for impaired skin integrity will decrease Outcome: Progressing   Problem: Activity: Goal: Ability to tolerate increased activity will improve Outcome: Progressing   Problem: Clinical Measurements: Goal: Ability to maintain a body temperature in the normal range will improve Outcome: Progressing   Problem: Respiratory: Goal: Ability to maintain adequate ventilation will improve Outcome: Progressing Goal: Ability to maintain a clear airway  will improve Outcome: Progressing   Problem: Activity: Goal: Ability to tolerate increased activity will improve Outcome: Progressing   Problem: Respiratory: Goal: Ability to maintain a clear airway and adequate ventilation will improve Outcome: Progressing   Problem: Role Relationship: Goal: Method of communication will improve Outcome: Progressing

## 2024-02-15 NOTE — Plan of Care (Signed)

## 2024-02-15 NOTE — Progress Notes (Signed)
[facility] Progress Note  Patient: Michelle Barnett    BMW:[id number]  DOA: 02/01/2024     Date of Service: the patient was seen and examined on 02/15/2024  Chief Complaint  Patient presents with   Respiratory Distress   Brief hospital course: Michelle Barnett is 63 y.o. female with hypertension, hyperlipidemia, iron deficiency anemia, who presented to the ED on 02/01/2024 with acute hypoxia. Pt is currently intubated, sedated, and unable to contribute to history and no family is currently available, therefore history was obtained per chart review.  patient was previously in her PCP office where she was noted to be 66% on room air, this improved on 3 L O2 but could not get higher than 88%.  EMS was called and brought to the patient to the hospital.  In the ED she was found to be septic with a temperature of 100.7, respirations 20, tachycardic to 113 and hypoxic.  Labs were mostly unrevealing.  Patient underwent CT head, CT cervical spine, CT maxillofacial which revealed soft tissue swelling of the periorbital soft tissues of the left and along the left frontal scalp.  She received DuoNebs, azithromycin, ceftriaxone.  Hospital stay has been prolonged by ongoing hypoxia, inability to wean oxygen, and persistent delirium.  Patient has now completed her course of antibiotics.  We have initiated steroid taper, but she remains on 6 L nasal cannula.   Patient's stay was also complicated by A-fib RVR necessitating cardiology consult, amiodarone drip and heparin drip which have since been transitioned to Eliquis and amiodarone p.o.  A-fib RVR likely provoked by hypoxia and breathing treatments. Patient stay is also been complicated by lip lesions which were initially thought to be impetigo but later resulted positive for HSV.  She has had some improvement with mupirocin and was initiated on valacyclovir."  2/4: Admitted by [facility]. 2/12: Rapid response called due to witnessed Generalized Tonic Clonic seizure,  self resolved.  Never lost pulse, however severely hypoxic, required emergent intubation and mechanical ventilation for airway protection. 2/13: No significant events noted overnight.  MRI Brain last night concerning for PRESS.  Lab unable to obtain blood draws, will place central line for access.  On minimal vent support, requiring minimal Levophed.  Will perform WUQ and SBT as able. 2/14: No events noted overnight.  Plan for LP today at bedside.  On minimal vent support, will plan for WUA/SBT following LP 2/15: Pt extubated to 3L O2 via nasal canula.  Pending speech/PT/OT   2/16 patient was transferred to [facility] service.  Please review previous notes for details, and further management as below.   Assessment and Plan:  63 y.o. female admitted following a fall at home with Acute Metabolic Encephalopathy, Acute Hypoxic Respiratory Failure in the setting of Community Acquired Pneumonia and Acute Asthma Exacerbation, A.fib with RVR, Acute Kidney Injury, and HSV 1 infection. Course complicated on 02/09/24 by Generalized Tonic Clonic Seizure with severe hypoxia requiring intubation and mechanical ventilation for airway protection.    # Generalized tonic clonic seizure~resolved # Acute metabolic encephalopathy~resolved  # Concern for PRESS MRI Brain consistent with PRESS EEG no PLEDS - Treat metabolic derangements  - Avoid sedating medications as able - Continue keppra 500 mg BID dosing - Meningitis/encephalitis panel 2/14: negative  - Neurology consulted: - D/c acyclovir, less likely herpes encephalitis as per ID - Resume eliquis if no contraindication - Continue keppra 500mg  bid. It is possible that this may be weaned in several mos as an outpatient, since PRES is by  definition not a permanent condition. - Neuro will arrange for outpatient neurology f/u - Seizure precautions incl no driving x6 mos after last seizure - PT/OT/Speech consulted, started pured dysphagia 1 diet   #Acute hypoxic  respiratory failure in the setting of CAP, pulmonary edema, and asthma exacerbation - Supplemental O2 for dyspnea and/or hypoxia  - Maintain O2 sats 92% or higher  - Prn bronchodilator therapy  - Continue iv steroid taper  2/17 Mucinex 600 mg p.o. twice daily, Tussionex prn for cough  #Atrial fibrillation with rvr~ currently in NSR #HTN  # Hx: CAD and bicuspid aortic valve  # Valvular abnormality, mitral, tricuspid and aortic valves as below. Echocardiogram 02/02/24: LVEF 55-60%. Normal diastolic parameters. RV systolic function is normal.  RV is moderately enlarged, moderate MR, moderate TR, moderate AS - Continuous telemetry monitoring  - Once able to tolerate po's resume amiodarone, atorvastatin, and ezetimibe  - Prn labetalol and/or hydralazine for bp management  2/16 started metoprolol 50 mg p.o. twice daily  #HSV-1 Infection~improving  #CAP~treated  - Trend WBC and monitor fever curve  - Follow culture results  S/p acyclovir, CSF negative so it has been discontinued. Started Valtrex 1000 mg twice daily for 2 days ID consulted, recommended antiviral therapy for 7 days   #Acute kidney Injury~resolved  #Metabolic alkalosis, suspect contraction in setting of diuresis~improving  - Trend BMP  - Strict I&O's - Avoid nephrotoxic agents as able - Replace electrolytes as indicated~pharmacy following for assistance with electrolyte replacement   #Anemia without obvious signs of bleeding  - Trend CBC  - Monitor for s/sx of bleeding  - Transfuse for hgb <7 Anemia workup within normal range 2/18 Hb 8.8, continue to monitor   Body mass index is 28.91 kg/m.  Nutrition Problem: Inadequate oral intake Etiology: inability to eat (pt sedated and ventilated) Interventions:  Diet: Pured dysphagia 1 diet DVT Prophylaxis: Eliquis  Advance goals of care discussion: Full code  Family Communication: family was not present at bedside, at the time of interview.  The pt provided  permission to discuss medical plan with the family. Opportunity was given to ask question and all questions were answered satisfactorily.   Disposition:  Pt is from Home, admitted with Seizures, Resp Failure, still has Resp Failure, which precludes a safe discharge. Discharge to Home with HH, when stable, may need few days more to improve.  Subjective: No significant overnight events, patient is more awake and alert today, breathing is getting better, still has mild productive cough.  No any other complaints.  Swallowing is improving.    Physical Exam: General: NAD, lying comfortably Appear in no distress, affect appropriate Eyes: PERRLA ENT: Oral Mucosa Clear, moist, perioral crusting due to herpes viral infection Neck: no JVD,  Cardiovascular: S1 and S2 Present, no Murmur,  Respiratory: Equal air entry bilaterally, mild bibasilar crackles, no wheezes. Abdomen: Bowel Sound present, Soft and no tenderness,  Skin: no rashes Extremities: no Pedal edema, no calf tenderness, mild edema of bilateral hands Neurologic: without any new focal findings Gait not checked due to patient safety concerns  Vitals:   02/14/24 1623 02/15/24 0114 02/15/24 0500 02/15/24 0822  BP: 135/79 110/62  (!) 144/74  Pulse: 79 63  75  Resp: 16 20  16   Temp: 98.5 F (36.9 C) 97.6 F (36.4 C)  98.2 F (36.8 C)  TempSrc:    Oral  SpO2: 100% 100%  93%  Weight:   60.6 kg   Height:  Intake/Output Summary (Last 24 hours) at 02/15/2024 1710 Last data filed at 02/15/2024 1000 Gross per 24 hour  Intake 100 ml  Output 400 ml  Net -300 ml   Filed Weights   02/12/24 0330 02/13/24 0412 02/15/24 0500  Weight: 58.3 kg 58.2 kg 60.6 kg    Data Reviewed: I have personally reviewed and interpreted daily labs, tele strips, imagings as discussed above. I reviewed all nursing notes, pharmacy notes, vitals, pertinent old records I have discussed plan of care as described above with RN and  patient/family.  CBC: Recent Labs  Lab 02/09/24 0438 02/10/24 1300 02/11/24 0404 02/12/24 0406 02/13/24 0424 02/14/24 0923 02/15/24 0518  WBC 5.8 7.5 11.2* 7.9 7.5 12.5* 8.4  NEUTROABS 4.6 5.6 8.8* 5.8 5.5  --   --   HGB 11.9* 9.7* 9.9* 8.9* 8.9* 10.3* 8.8*  HCT 38.1 29.5* 31.3* 27.7* 27.7* 32.5* 28.2*  MCV 98.7 94.2 95.7 96.2 96.9 97.6 99.3  PLT 231 200 204 166 145* 160 167   Basic Metabolic Panel: Recent Labs  Lab 02/11/24 0404 02/12/24 0406 02/13/24 0424 02/14/24 0923 02/15/24 0518  NA 141 141 146* 144 148*  K 3.7 3.7 3.9 4.2 4.1  CL 95* 101 104 104 106  CO2 36* 34* 32 32 35*  GLUCOSE 91 86 86 86 104*  BUN 35* 30* 24* 24* 32*  CREATININE 1.00 0.93 0.80 0.82 0.81  CALCIUM 8.3* 8.1* 8.5* 8.7* 8.9  MG 1.6* 2.3 2.1 1.9 2.1  PHOS 3.1 3.3 2.7 2.4* 3.7    Studies: No results found.  Scheduled Meds:  amiodarone  200 mg Oral BID   apixaban  5 mg Oral BID   atorvastatin  40 mg Oral QHS   Chlorhexidine Gluconate Cloth  6 each Topical Daily   cyanocobalamin  1,000 mcg Oral Daily   ezetimibe  10 mg Oral Daily   feeding supplement  237 mL Oral TID BM   guaiFENesin  600 mg Oral BID   levETIRAcetam  500 mg Oral BID   methylPREDNISolone (SOLU-MEDROL) injection  20 mg Intravenous Daily   Followed by   Melene Muller ON 02/17/2024] methylPREDNISolone (SOLU-MEDROL) injection  10 mg Intravenous Daily   metoprolol tartrate  50 mg Oral BID   multivitamin with minerals  1 tablet Oral Daily   mupirocin ointment   Nasal BID   mouth rinse  15 mL Mouth Rinse 4 times per day   pantoprazole (PROTONIX) IV  40 mg Intravenous Daily   polyethylene glycol  17 g Oral Daily   sodium chloride flush  10-40 mL Intracatheter Q12H   thiamine  100 mg Oral Daily   Continuous Infusions:   PRN Meds: acetaminophen, chlorpheniramine-HYDROcodone, hydrALAZINE, HYDROcodone-acetaminophen, ipratropium-albuterol, mouth rinse, mouth rinse, senna-docusate, sodium chloride flush  Time spent: 40  minutes  Author: Gillis Santa. MD [facility] 02/15/2024 5:10 PM  To reach On-call, see care teams to locate the attending and reach out to them via [email or web address]. If 7PM-7AM, please contact night-coverage If you still have difficulty reaching the attending provider, please page the [facility] (Director on Call) for [facility] on amion for assistance.

## 2024-02-15 NOTE — Progress Notes (Signed)
Date of Admission:  02/01/2024      ID: Michelle Barnett is a 63 y.o. female Principal Problem:   Pneumonia of right lower lobe due to infectious organism Active Problems:   Essential hypertension   Vitamin B12 deficiency   Adjustment disorder with mixed anxiety and depressed mood   Acute hypoxic respiratory failure (HCC)   GERD (gastroesophageal reflux disease)   Mild intermittent asthma without complication   Hyperlipidemia   Hypoxia   AKI (acute kidney injury) (HCC)   Ecchymosis of left eye   Sepsis (HCC)   Paroxysmal atrial fibrillation (HCC)   Atrial fibrillation with RVR (HCC)   Hiatal hernia   Pneumonia   HSV-1 infection   Acute encephalopathy  Michelle Barnett is a 63 y.o. with a history of HTN, IDA  COPD,  , Presents with fall on Sunday night and was hallucinating, had hematoma left eye, abraded lips, has chronic cough wbnt to her PCP's office on 02/01/24 and pulse ox was low (66%)  and she was sent to ED Had a hematoma above left eye, bruising around left eye Vitals in the ED 100.7, RR20, pulse 113, BP 110/62 Labs revealed Na 132, BUN 71, Cr 1.19, WBC 8.8, HB 13.9, PLT 115 Lactic acid 1 Ct head - no acute findings Blood culture sent Started on ceftriaxone and azithromycin for possible pneumonia EKG- RBBB and then Afib Started IV amiodarone Given IV lasix without much improvement I was asked to see her for the lip lesions whether it could be RIME, mycoplasma related skin and mucous lesions. Placed on valtrex as HSV dna positive- I signed off on 2/11 .pt had fluctauting encephalopathy and then developed seizures on 2/12 and I was back on the case- was intubated, CT head and then MRI showed PRES No temporal lobe enhancement or PLEDS LP done and csf was  neg for any infection including HSV, IV acyclovir was Dc and was switched back to valtrex which she completed on 2/16. She was extubated on 2/16 and was transferred to the floor She is feeling better, but  lethargic    Medications:   amiodarone  200 mg Oral BID   apixaban  5 mg Oral BID   atorvastatin  40 mg Oral QHS   Chlorhexidine Gluconate Cloth  6 each Topical Daily   cyanocobalamin  1,000 mcg Oral Daily   ezetimibe  10 mg Oral Daily   feeding supplement  237 mL Oral TID BM   guaiFENesin  600 mg Oral BID   levETIRAcetam  500 mg Oral BID   methylPREDNISolone (SOLU-MEDROL) injection  20 mg Intravenous Daily   Followed by   Michelle Barnett ON 02/17/2024] methylPREDNISolone (SOLU-MEDROL) injection  10 mg Intravenous Daily   metoprolol tartrate  50 mg Oral BID   multivitamin with minerals  1 tablet Oral Daily   mupirocin ointment   Nasal BID   mouth rinse  15 mL Mouth Rinse 4 times per day   pantoprazole (PROTONIX) IV  40 mg Intravenous Daily   polyethylene glycol  17 g Oral Daily   sodium chloride flush  10-40 mL Intracatheter Q12H   thiamine  100 mg Oral Daily    Objective: Vital signs in last 24 hours: Patient Vitals for the past 24 hrs:  BP Temp Temp src Pulse Resp SpO2 Weight  02/15/24 0822 (!) 144/74 98.2 F (36.8 C) Oral 75 16 93 % --  02/15/24 0500 -- -- -- -- -- -- 60.6 kg  02/15/24 0114 110/62 97.6  F (36.4 C) -- 63 20 100 % --  02/14/24 1623 135/79 98.5 F (36.9 C) -- 79 16 100 % --     PHYSICAL EXAM:  General:more responsive to questions- oriented in place and person Says she is feeling better Perioral lesions, scabs  Lungs: b/l air entry, a few rhnochi Heart: s1s2 systolic murmur Abdomen: Soft, non-tender,not distended. Bowel sounds normal. No masses Extremities: atraumatic, no cyanosis. No edema. No clubbing Skin: No rashes or lesions. Or bruising Lymph: Cervical, supraclavicular normal. Neurologic:   non focal  Lab Results    Latest Ref Rng & Units 02/15/2024    5:18 AM 02/14/2024    9:23 AM 02/13/2024    4:24 AM  CBC  WBC 4.0 - 10.5 K/uL 8.4  12.5  7.5   Hemoglobin 12.0 - 15.0 g/dL 8.8  95.2  8.9   Hematocrit 36.0 - 46.0 % 28.2  32.5  27.7   Platelets 150  - 400 K/uL 167  160  145        Latest Ref Rng & Units 02/15/2024    5:18 AM 02/14/2024    9:23 AM 02/13/2024    4:24 AM  CMP  Glucose 70 - 99 mg/dL 841  86  86   BUN 8 - 23 mg/dL 32  24  24   Creatinine 0.44 - 1.00 mg/dL 3.24  4.01  0.27   Sodium 135 - 145 mmol/L 148  144  146   Potassium 3.5 - 5.1 mmol/L 4.1  4.2  3.9   Chloride 98 - 111 mmol/L 106  104  104   CO2 22 - 32 mmol/L 35  32  32   Calcium 8.9 - 10.3 mg/dL 8.9  8.7  8.5   Total Protein 6.5 - 8.1 g/dL   5.2   Total Bilirubin 0.0 - 1.2 mg/dL   0.9   Alkaline Phos 38 - 126 U/L   34   AST 15 - 41 U/L   33   ALT 0 - 44 U/L   42     CSF only 2 wbc 29 RBC TP 28  Microbiology: BC- NG HSV DNA of perioral lesion positive for HSV 1   Assessment/Plan: 63 yr female presenting with fall, sob X 1 day duration and not feeling well for a few days  Seizure, intubated for air way protection- has been extubated and out of ICU   ?Acute hypoxic resp failure with hypercapnia- COPD exacerbation/ Community Acquired Pneumonia/Chf Was on  ceftriaxone  ( completed 5 days) and Doxy- and then Doxy- managed as per hosptialist PE NEg  urine legionella /mycoplasma Neg   Acute encephalopathy, seizures Etiology was thought to be Secondary to Co2 retention, hypoxia and high dose steroids as per hospitalist Then PRES was diagnosed by MRI  Perioral lesions due to HSV 1  Started valtrex adjusted to crcl for 5-7 days Watch closely for sz, because of AKI  I had signed off on 2/11 But tback on case on 2/12 on the case as she had seizures and there is concern for HSV encephalitis  MRI with contrast no temporal lobe enhancement Has features of PRES EEG no PLEDS  With no temporal lobes enhancement or PLED less liekly HSV encephalitis HSV ecncephalitis ruled out by normal csf findings and neg HSV DNA IV acyclovir stopped and she completed valtrext  Fall with ecchymosis left eye Hematoma   Afib with RVR on amiodarone- well controlled    Thrombocytopenia resolved   AKI  Discussed the management with primary team ID will sign off- call if needed

## 2024-02-16 ENCOUNTER — Inpatient Hospital Stay: Payer: No Typology Code available for payment source

## 2024-02-16 DIAGNOSIS — J189 Pneumonia, unspecified organism: Secondary | ICD-10-CM | POA: Diagnosis not present

## 2024-02-16 LAB — BASIC METABOLIC PANEL
Anion gap: 9 (ref 5–15)
BUN: 32 mg/dL — ABNORMAL HIGH (ref 8–23)
CO2: 30 mmol/L (ref 22–32)
Calcium: 8.8 mg/dL — ABNORMAL LOW (ref 8.9–10.3)
Chloride: 107 mmol/L (ref 98–111)
Creatinine, Ser: 0.79 mg/dL (ref 0.44–1.00)
GFR, Estimated: >60 mL/min (ref >60)
Glucose, Bld: 95 mg/dL (ref 70–99)
Potassium: 4.1 mmol/L (ref 3.5–5.1)
Sodium: 146 mmol/L — ABNORMAL HIGH (ref 135–145)

## 2024-02-16 LAB — PHOSPHORUS: Phosphorus: 2.2 mg/dL — ABNORMAL LOW (ref 2.5–4.6)

## 2024-02-16 LAB — BLOOD GAS, ARTERIAL
Acid-Base Excess: 17.1 mmol/L — ABNORMAL HIGH (ref 0.0–2.0)
Bicarbonate: 42.9 mmol/L — ABNORMAL HIGH (ref 20.0–28.0)
O2 Content: 2 L/min
O2 Saturation: 93.3 %
Patient temperature: 37
pCO2 arterial: 59 mm[Hg] — ABNORMAL HIGH (ref 32–48)
pH, Arterial: 7.47 — ABNORMAL HIGH (ref 7.35–7.45)
pO2, Arterial: 61 mm[Hg] — ABNORMAL LOW (ref 83–108)

## 2024-02-16 LAB — ANAEROBIC CULTURE W GRAM STAIN

## 2024-02-16 LAB — CBC
HCT: 31 % — ABNORMAL LOW (ref 36.0–46.0)
Hemoglobin: 9.7 g/dL — ABNORMAL LOW (ref 12.0–15.0)
MCH: 31 pg (ref 26.0–34.0)
MCHC: 31.3 g/dL (ref 30.0–36.0)
MCV: 99 fL (ref 80.0–100.0)
Platelets: 164 10*3/uL (ref 150–400)
RBC: 3.13 MIL/uL — ABNORMAL LOW (ref 3.87–5.11)
RDW: 14.2 % (ref 11.5–15.5)
WBC: 8.8 10*3/uL (ref 4.0–10.5)
nRBC: 0 % (ref 0.0–0.2)

## 2024-02-16 LAB — GLUCOSE, CAPILLARY
Glucose-Capillary: 99 mg/dL (ref 70–99)
Glucose-Capillary: 112 mg/dL — ABNORMAL HIGH (ref 70–99)

## 2024-02-16 LAB — MAGNESIUM: Magnesium: 2.1 mg/dL (ref 1.7–2.4)

## 2024-02-16 MED ORDER — LEVETIRACETAM 500 MG PO TABS
500.0000 mg | ORAL_TABLET | Freq: Two times a day (BID) | ORAL | Status: DC
  Administered 2024-02-16 – 2024-02-25 (×19): 500 mg via ORAL
  Filled 2024-02-16 (×20): qty 1

## 2024-02-16 MED ORDER — LEVETIRACETAM IN NACL 500 MG/100ML IV SOLN
500.0000 mg | Freq: Two times a day (BID) | INTRAVENOUS | Status: DC
  Filled 2024-02-16 (×11): qty 100

## 2024-02-16 MED ORDER — POTASSIUM PHOSPHATES 15 MMOLE/5ML IV SOLN
15.0000 mmol | Freq: Once | INTRAVENOUS | Status: AC
  Administered 2024-02-16: 15 mmol via INTRAVENOUS
  Filled 2024-02-16: qty 5

## 2024-02-16 MED ORDER — LOSARTAN POTASSIUM 25 MG PO TABS
25.0000 mg | ORAL_TABLET | Freq: Every day | ORAL | Status: DC
  Administered 2024-02-16: 25 mg via ORAL
  Filled 2024-02-16: qty 1

## 2024-02-16 NOTE — Progress Notes (Signed)
ABG and CXR obtained, patient is more responsive and oxygen saturations are 98% on 2 liters nasal cannula.

## 2024-02-16 NOTE — Plan of Care (Signed)
  Problem: Education: Goal: Knowledge of General Education information will improve Description: Including pain rating scale, medication(s)/side effects and non-pharmacologic comfort measures Outcome: Progressing   Problem: Clinical Measurements: Goal: Will remain free from infection Outcome: Progressing   Problem: Clinical Measurements: Goal: Respiratory complications will improve Outcome: Progressing   Problem: Clinical Measurements: Goal: Cardiovascular complication will be avoided Outcome: Progressing   Problem: Elimination: Goal: Will not experience complications related to bowel motility Outcome: Progressing   Problem: Elimination: Goal: Will not experience complications related to urinary retention Outcome: Progressing   Problem: Pain Managment: Goal: General experience of comfort will improve and/or be controlled Outcome: Progressing   Problem: Safety: Goal: Ability to remain free from injury will improve Outcome: Progressing

## 2024-02-16 NOTE — Plan of Care (Signed)
  Problem: Education: Goal: Knowledge of General Education information will improve Description: Including pain rating scale, medication(s)/side effects and non-pharmacologic comfort measures Outcome: Progressing   Problem: Health Behavior/Discharge Planning: Goal: Ability to manage health-related needs will improve Outcome: Progressing   Problem: Clinical Measurements: Goal: Ability to maintain clinical measurements within normal limits will improve Outcome: Progressing Goal: Will remain free from infection Outcome: Progressing Goal: Diagnostic test results will improve Outcome: Progressing Goal: Respiratory complications will improve Outcome: Progressing Goal: Cardiovascular complication will be avoided Outcome: Progressing   Problem: Activity: Goal: Risk for activity intolerance will decrease Outcome: Progressing   Problem: Nutrition: Goal: Adequate nutrition will be maintained Outcome: Progressing   Problem: Coping: Goal: Level of anxiety will decrease Outcome: Progressing   Problem: Elimination: Goal: Will not experience complications related to bowel motility Outcome: Progressing Goal: Will not experience complications related to urinary retention Outcome: Progressing   Problem: Pain Managment: Goal: General experience of comfort will improve and/or be controlled Outcome: Progressing   Problem: Safety: Goal: Ability to remain free from injury will improve Outcome: Progressing   Problem: Skin Integrity: Goal: Risk for impaired skin integrity will decrease Outcome: Progressing   Problem: Activity: Goal: Ability to tolerate increased activity will improve Outcome: Progressing   Problem: Clinical Measurements: Goal: Ability to maintain a body temperature in the normal range will improve Outcome: Progressing   Problem: Respiratory: Goal: Ability to maintain adequate ventilation will improve Outcome: Progressing Goal: Ability to maintain a clear airway  will improve Outcome: Progressing   Problem: Activity: Goal: Ability to tolerate increased activity will improve Outcome: Progressing   Problem: Respiratory: Goal: Ability to maintain a clear airway and adequate ventilation will improve Outcome: Progressing   Problem: Role Relationship: Goal: Method of communication will improve Outcome: Progressing

## 2024-02-16 NOTE — Progress Notes (Signed)
Occupational Therapy Treatment Patient Details Name: JNAI SNELLGROVE MRN: [id number] DOB: 11/21/1961 Today's Date: 02/16/2024   History of present illness Pt is a 63 y.o. female admitted following a fall at home with acute metabolic encephalopathy, acute hypoxic respiratory failure in the setting of CAP and acute asthma exacerbation, A.fib with RVR, AKI, anemia without obvious signs of bleeding, and HSV 1 infection. Course complicated on 02/09/24 by generalized tonic clonic seizure with severe hypoxia requiring intubation and mechanical ventilation for airway protection. Pt extubated 02/12/24.   OT comments  Pt with eyes closed upon OT arrival, but awake and verbalized being agreeable to participation in OT session.  Focus on bed level ADLs this session; see below for details.  NT assisted with bed bath, linen change, and peri care d/t episode of bowel incontinence, with OT providing ongoing cues to open eyes for increasing ADL participation, reaching for bedrails while rolling, and participating in aspects of UB bathing/grooming tasks.  Lethargy and generalized weakness limited sustained attention and UB ADL performance.  Pillow wedged beneath L buttock for slight R sided tilt pressure relief with HOB slightly elevated.  All necessary items placed within reach.  Will continue to follow per poc.  Continue to recommend SNF upon d/c to maximize return to PLOF and reduce physical burden of care on caregiver.      If plan is discharge home, recommend the following:  Two people to help with walking and/or transfers;Two people to help with bathing/dressing/bathroom;Assist for transportation;Help with stairs or ramp for entrance;Direct supervision/assist for financial management;Supervision due to cognitive status;Direct supervision/assist for medications management;Assistance with cooking/housework   Equipment Recommendations  Other (comment) (defer to next venue of care)    Recommendations for Other  Services      Precautions / Restrictions Precautions Precautions: Fall Recall of Precautions/Restrictions: Impaired Restrictions Weight Bearing Restrictions Per Provider Order: No       Mobility Bed Mobility Overal bed mobility: Needs Assistance Bed Mobility: Rolling Rolling: Max assist         General bed mobility comments: Max A to roll to L, total to the R; hand over hand assist to guide arms to utilize bed rail for rolling assist Patient Response: Cooperative, Flat affect  Transfers                         Balance       Sitting balance - Comments: NT this date       Standing balance comment: NT this date                           ADL either performed or assessed with clinical judgement   ADL Overall ADL's : Needs assistance/impaired     Grooming: Oral care;Wash/dry face;Maximal assistance Grooming Details (indicate cue type and reason): oral care with mod A and max vc, washing face with max A; did not follow commands to wash other areas on face d/t perseverative with cleaning dried blood from lips Upper Body Bathing: Total assistance;Bed level Upper Body Bathing Details (indicate cue type and reason): Provided hand over hand assist for pt to wash chest; little follow through with multiple attempts Lower Body Bathing: Total assistance;Bed level   Upper Body Dressing : Bed level;Maximal assistance Upper Body Dressing Details (indicate cue type and reason): Pt lifts arms on command to help with threading arms through gown         Toileting- Clothing Manipulation  and Hygiene: Total assistance;Bed level;+2 for safety/equipment Toileting - Clothing Manipulation Details (indicate cue type and reason): incontinent of bowel and bladder; nurse tech in to assist OT with clean up and linen change            Extremity/Trunk Assessment Upper Extremity Assessment Upper Extremity Assessment: Generalized weakness   Lower Extremity  Assessment Lower Extremity Assessment: Generalized weakness                    Praxis     Communication Communication Communication: Impaired   Cognition Arousal: Lethargic Behavior During Therapy: Flat affect, WFL for tasks assessed/performed   Difficult to assess due to: Level of arousal           OT - Cognition Comments: oriented to self, knew she was in [place] but thought she was home.                 Following commands: Impaired Following commands impaired: Follows one step commands inconsistently      Cueing   Cueing Techniques: Verbal cues, Tactile cues, Visual cues, Gestural cues  Exercises             General Comments sp02 mid 90s with activity, high 90s at rest on 2L nasal cannula    Pertinent Vitals/ Pain       Pain Assessment Pain Assessment: No/denies pain  Home Living                                          Prior Functioning/Environment              Frequency  Min 1X/week        Progress Toward Goals  OT Goals(current goals can now be found in the care plan section)  Progress towards OT goals: Progressing toward goals  Acute Rehab OT Goals Patient Stated Goal: get better OT Goal Formulation: With patient Time For Goal Achievement: 02/28/24 Potential to Achieve Goals: Fair  Plan      Co-evaluation                 AM-PAC OT "6 Clicks" Daily Activity     Outcome Measure   Help from another person eating meals?: A Lot Help from another person taking care of personal grooming?: A Lot Help from another person toileting, which includes using toliet, bedpan, or urinal?: Total Help from another person bathing (including washing, rinsing, drying)?: Total Help from another person to put on and taking off regular upper body clothing?: A Lot Help from another person to put on and taking off regular lower body clothing?: Total 6 Click Score: 9    End of Session Equipment Utilized During  Treatment: Oxygen  OT Visit Diagnosis: Other abnormalities of gait and mobility (R26.89);Muscle weakness (generalized) (M62.81)   Activity Tolerance Patient limited by lethargy   Patient Left in bed;with call bell/phone within reach;with bed alarm set;with nursing/sitter in room   Nurse Communication  (nurse tech aware of BM incontinence and assisted with clean up)        Time: 1610-9604 OT Time Calculation (min): 50 min  Charges: OT General Charges $OT Visit: 1 Visit OT Treatments $Self Care/Home Management : 38-52 mins  Danelle Earthly, MS, OTR/L   Otis Dials 02/16/2024, 11:33 AM

## 2024-02-16 NOTE — Progress Notes (Addendum)
Mobility Specialist - Progress Note   02/16/24 1400  Mobility  Activity Ambulated with assistance in room  Level of Assistance Moderate assist, patient does 50-74%  Assistive Device Front wheel walker  Distance Ambulated (ft) 15 ft  Activity Response Tolerated well  Mobility visit 1 Mobility     Pt attempting to exit bed on arrival, utilizing 3L. O2 doffed upon entering, and replaced. Pt agreeable to activity. Completed bed mobility with minA. STS with minA +2 and ambulation with modA +2. Post lean upon standing corrected with cueing. Pt very unsteady with scissoring gait. Assist for RW negotiation and obstacle avoidance. VC for sequencing. VC for proper RW biomechanics with turning. Follows single-step commands fairly. No LOB. Pt initially resistive to return to bed but was left in chair position in bed with alarm set, needs in reach. RN notified.   Filiberto Pinks Mobility Specialist 02/16/24, 3:24 PM

## 2024-02-16 NOTE — Progress Notes (Signed)
NT was obtaining VS and stated oxygen was 83%, nurse checked patient and oxygen was not connected to the wall so she had been without oxygen for an undetermined amount of time. Saturations increased to 90% when nurse connected the o2 but patient is very lethargic, patient will respond to sternal rub and reaches up to stop nurse but does not want to wake up..notified MD by secure chat to see if ABG is needed or further action. Otherwise vital signs were stable. Nurse will page if no response from secure chat.

## 2024-02-16 NOTE — Progress Notes (Signed)
Patient pulled out IV access and removed oxygen. IV team consulted to restart PIV.

## 2024-02-16 NOTE — Progress Notes (Signed)
[facility] Progress Note  Patient: Michelle Barnett    NFA:[id number]  DOA: 02/01/2024     Date of Service: the patient was seen and examined on 02/16/2024  Chief Complaint  Patient presents with   Respiratory Distress   Brief hospital course: Michelle Barnett is 63 y.o. female with hypertension, hyperlipidemia, iron deficiency anemia, who presented to the ED on 02/01/2024 with acute hypoxia. Pt is currently intubated, sedated, and unable to contribute to history and no family is currently available, therefore history was obtained per chart review.  patient was previously in her PCP office where she was noted to be 66% on room air, this improved on 3 L O2 but could not get higher than 88%.  EMS was called and brought to the patient to the hospital.  In the ED she was found to be septic with a temperature of 100.7, respirations 20, tachycardic to 113 and hypoxic.  Labs were mostly unrevealing.  Patient underwent CT head, CT cervical spine, CT maxillofacial which revealed soft tissue swelling of the periorbital soft tissues of the left and along the left frontal scalp.  She received DuoNebs, azithromycin, ceftriaxone.  Hospital stay has been prolonged by ongoing hypoxia, inability to wean oxygen, and persistent delirium.  Patient has now completed her course of antibiotics.  We have initiated steroid taper, but she remains on 6 L nasal cannula.   Patient's stay was also complicated by A-fib RVR necessitating cardiology consult, amiodarone drip and heparin drip which have since been transitioned to Eliquis and amiodarone p.o.  A-fib RVR likely provoked by hypoxia and breathing treatments. Patient stay is also been complicated by lip lesions which were initially thought to be impetigo but later resulted positive for HSV.  She has had some improvement with mupirocin and was initiated on valacyclovir."  2/4: Admitted by [facility]. 2/12: Rapid response called due to witnessed Generalized Tonic Clonic seizure,  self resolved.  Never lost pulse, however severely hypoxic, required emergent intubation and mechanical ventilation for airway protection. 2/13: No significant events noted overnight.  MRI Brain last night concerning for PRESS.  Lab unable to obtain blood draws, will place central line for access.  On minimal vent support, requiring minimal Levophed.  Will perform WUQ and SBT as able. 2/14: No events noted overnight.  Plan for LP today at bedside.  On minimal vent support, will plan for WUA/SBT following LP 2/15: Pt extubated to 3L O2 via nasal canula.  Pending speech/PT/OT   2/16 patient was transferred to [facility] service.  Please review previous notes for details, and further management as below.   Assessment and Plan:  63 y.o. female admitted following a fall at home with Acute Metabolic Encephalopathy, Acute Hypoxic Respiratory Failure in the setting of Community Acquired Pneumonia and Acute Asthma Exacerbation, A.fib with RVR, Acute Kidney Injury, and HSV 1 infection. Course complicated on 02/09/24 by Generalized Tonic Clonic Seizure with severe hypoxia requiring intubation and mechanical ventilation for airway protection.    # Generalized tonic clonic seizure~resolved # Acute metabolic encephalopathy~resolved  # Concern for PRESS MRI Brain consistent with PRESS EEG no PLEDS - Treat metabolic derangements  - Avoid sedating medications as able - Continue keppra 500 mg BID dosing - Meningitis/encephalitis panel 2/14: negative  - Neurology consulted: - D/c acyclovir, less likely herpes encephalitis as per ID - Resume eliquis if no contraindication - Continue keppra 500mg  bid. It is possible that this may be weaned in several mos as an outpatient, since PRES is by  definition not a permanent condition. - Neuro will arrange for outpatient neurology f/u - Seizure precautions incl no driving x6 mos after last seizure - PT/OT/Speech consulted, started pured dysphagia 1 diet   #Acute hypoxic  respiratory failure in the setting of CAP, pulmonary edema, and asthma exacerbation - Supplemental O2 for dyspnea and/or hypoxia  - Maintain O2 sats 92% or higher  - Prn bronchodilator therapy  - Continue iv steroid taper  2/17 Mucinex 600 mg p.o. twice daily, Tussionex prn for cough 2/19 persistent hypoxia, continue supplemental admission, ABG reviewed.   2/19 CXR: Interval extubation. Persistent left retrocardiac consolidation/atelectasis. Slightly increased bibasilar opacities.  #Atrial fibrillation with rvr~ currently in NSR #HTN  # Hx: CAD and bicuspid aortic valve  # Valvular abnormality, mitral, tricuspid and aortic valves as below. Echocardiogram 02/02/24: LVEF 55-60%. Normal diastolic parameters. RV systolic function is normal.  RV is moderately enlarged, moderate MR, moderate TR, moderate AS - Continuous telemetry monitoring  - Once able to tolerate po's resume amiodarone, atorvastatin, and ezetimibe  - Prn labetalol and/or hydralazine for bp management  2/16 started metoprolol 50 mg p.o. twice daily 2/19 started losartan 25 mg p.o. daily with holding parameters Utilizing IV hydralazine prn   #HSV-1 Infection~improving  #CAP~treated  - Trend WBC and monitor fever curve  - Follow culture results  S/p acyclovir, CSF negative so it has been discontinued. Started Valtrex 1000 mg twice daily for 2 days ID consulted, recommended antiviral therapy for 7 days   #Acute kidney Injury~resolved  #Metabolic alkalosis, suspect contraction in setting of diuresis~improving  - Trend BMP  - Strict I&O's - Avoid nephrotoxic agents as able - Replace electrolytes as indicated~pharmacy following for assistance with electrolyte replacement   #Anemia without obvious signs of bleeding  - Trend CBC  - Monitor for s/sx of bleeding  - Transfuse for hgb <7 Anemia workup within normal range 2/18 Hb 8.8, continue to monitor  # Hypophosphatemia, Phos repleted. Monitor electrolytes and replete as  needed   Body mass index is 28.43 kg/m.  Nutrition Problem: Inadequate oral intake Etiology: inability to eat (pt sedated and ventilated) Interventions:  Diet: Pured dysphagia 1 diet DVT Prophylaxis: Eliquis  Advance goals of care discussion: Full code  Family Communication: family was present at bedside, at the time of interview.  The pt provided permission to discuss medical plan with the family. Opportunity was given to ask question and all questions were answered satisfactorily.   Disposition:  Pt is from Home, admitted with Seizures, Resp Failure, still has Resp Failure, which precludes a safe discharge. Discharge to Home with HH, when stable, may need few days more to improve.  Subjective: No significant overnight events, early morning patient was lethargic due to hypoxemia, supplemental O2 admission failed off and she was saturating very low, oxygen was placed back and she was more awake and alert.  ABG reviewed, chest x-ray was done. Patient denied any specific complaints, remains sleepy, not very active.     Physical Exam: General: NAD, lying comfortably Appear in no distress, affect appropriate Eyes: PERRLA ENT: Oral Mucosa Clear, moist, perioral crusting due to herpes viral infection Neck: no JVD,  Cardiovascular: S1 and S2 Present, no Murmur,  Respiratory: Equal air entry bilaterally, mild bibasilar crackles, no wheezes. Abdomen: Bowel Sound present, Soft and no tenderness,  Skin: no rashes Extremities: no Pedal edema, no calf tenderness, mild edema of bilateral hands Neurologic: without any new focal findings Gait not checked due to patient safety concerns  Vitals:  02/15/24 2152 02/16/24 0500 02/16/24 0814 02/16/24 0816  BP: (!) 150/76   (!) 146/74  Pulse:    64  Resp: 18   20  Temp: 98.4 F (36.9 C)   98.2 F (36.8 C)  TempSrc: Oral   Oral  SpO2: 95%  (!) 83% 91%  Weight:  59.6 kg    Height:        Intake/Output Summary (Last 24 hours) at  02/16/2024 1612 Last data filed at 02/16/2024 1100 Gross per 24 hour  Intake 0 ml  Output --  Net 0 ml   Filed Weights   02/13/24 0412 02/15/24 0500 02/16/24 0500  Weight: 58.2 kg 60.6 kg 59.6 kg    Data Reviewed: I have personally reviewed and interpreted daily labs, tele strips, imagings as discussed above. I reviewed all nursing notes, pharmacy notes, vitals, pertinent old records I have discussed plan of care as described above with RN and patient/family.  CBC: Recent Labs  Lab 02/10/24 1300 02/11/24 0404 02/12/24 0406 02/13/24 0424 02/14/24 0923 02/15/24 0518 02/16/24 0529  WBC 7.5 11.2* 7.9 7.5 12.5* 8.4 8.8  NEUTROABS 5.6 8.8* 5.8 5.5  --   --   --   HGB 9.7* 9.9* 8.9* 8.9* 10.3* 8.8* 9.7*  HCT 29.5* 31.3* 27.7* 27.7* 32.5* 28.2* 31.0*  MCV 94.2 95.7 96.2 96.9 97.6 99.3 99.0  PLT 200 204 166 145* 160 167 164   Basic Metabolic Panel: Recent Labs  Lab 02/12/24 0406 02/13/24 0424 02/14/24 0923 02/15/24 0518 02/16/24 0529  NA 141 146* 144 148* 146*  K 3.7 3.9 4.2 4.1 4.1  CL 101 104 104 106 107  CO2 34* 32 32 35* 30  GLUCOSE 86 86 86 104* 95  BUN 30* 24* 24* 32* 32*  CREATININE 0.93 0.80 0.82 0.81 0.79  CALCIUM 8.1* 8.5* 8.7* 8.9 8.8*  MG 2.3 2.1 1.9 2.1 2.1  PHOS 3.3 2.7 2.4* 3.7 2.2*    Studies: DG Chest Port 1 View Result Date: 02/16/2024 CLINICAL DATA:  Pneumonia EXAM: PORTABLE CHEST - 1 VIEW COMPARISON:  02/10/2024 FINDINGS: Interval extubation. Somewhat lower lung volumes. Persistent left retrocardiac consolidation/atelectasis. Patchy opacities in both lung bases have slightly increased. Elevated left hemidiaphragm as before. Heart size and mediastinal contours are within normal limits. Suspect small left pleural effusion. Visualized bones unremarkable. IMPRESSION: 1. Interval extubation. 2. Persistent left retrocardiac consolidation/atelectasis. 3. Slightly increased bibasilar opacities. Electronically Signed   By: Corlis Leak M.D.   On: 02/16/2024 09:11     Scheduled Meds:  amiodarone  200 mg Oral BID   apixaban  5 mg Oral BID   atorvastatin  40 mg Oral QHS   Chlorhexidine Gluconate Cloth  6 each Topical Daily   cyanocobalamin  1,000 mcg Oral Daily   ezetimibe  10 mg Oral Daily   feeding supplement  237 mL Oral TID BM   guaiFENesin  600 mg Oral BID   levETIRAcetam  500 mg Oral BID   losartan  25 mg Oral Daily   [START ON 02/17/2024] methylPREDNISolone (SOLU-MEDROL) injection  10 mg Intravenous Daily   metoprolol tartrate  50 mg Oral BID   multivitamin with minerals  1 tablet Oral Daily   mupirocin ointment   Nasal BID   mouth rinse  15 mL Mouth Rinse 4 times per day   pantoprazole (PROTONIX) IV  40 mg Intravenous Daily   polyethylene glycol  17 g Oral Daily   sodium chloride flush  10-40 mL Intracatheter Q12H   thiamine  100 mg Oral Daily   Continuous Infusions:  levETIRAcetam     potassium PHOSPHATE IVPB (in mmol) 15 mmol (02/16/24 1227)    PRN Meds: acetaminophen, chlorpheniramine-HYDROcodone, hydrALAZINE, HYDROcodone-acetaminophen, ipratropium-albuterol, mouth rinse, mouth rinse, senna-docusate, sodium chloride flush  Time spent: 55 minutes  Author: Gillis Santa. MD [facility] 02/16/2024 4:12 PM  To reach On-call, see care teams to locate the attending and reach out to them via [email or web address]. If 7PM-7AM, please contact night-coverage If you still have difficulty reaching the attending provider, please page the [facility] (Director on Call) for [facility] on amion for assistance.

## 2024-02-17 DIAGNOSIS — J189 Pneumonia, unspecified organism: Secondary | ICD-10-CM | POA: Diagnosis not present

## 2024-02-17 LAB — BASIC METABOLIC PANEL
Anion gap: 7 (ref 5–15)
BUN: 29 mg/dL — ABNORMAL HIGH (ref 8–23)
CO2: 33 mmol/L — ABNORMAL HIGH (ref 22–32)
Calcium: 8.6 mg/dL — ABNORMAL LOW (ref 8.9–10.3)
Chloride: 105 mmol/L (ref 98–111)
Creatinine, Ser: 0.72 mg/dL (ref 0.44–1.00)
GFR, Estimated: >60 mL/min (ref >60)
Glucose, Bld: 76 mg/dL (ref 70–99)
Potassium: 3.8 mmol/L (ref 3.5–5.1)
Sodium: 145 mmol/L (ref 135–145)

## 2024-02-17 LAB — PHOSPHORUS: Phosphorus: 3.4 mg/dL (ref 2.5–4.6)

## 2024-02-17 LAB — CBC
HCT: 28.4 % — ABNORMAL LOW (ref 36.0–46.0)
Hemoglobin: 8.8 g/dL — ABNORMAL LOW (ref 12.0–15.0)
MCH: 30.9 pg (ref 26.0–34.0)
MCHC: 31 g/dL (ref 30.0–36.0)
MCV: 99.6 fL (ref 80.0–100.0)
Platelets: 166 10*3/uL (ref 150–400)
RBC: 2.85 MIL/uL — ABNORMAL LOW (ref 3.87–5.11)
RDW: 14.3 % (ref 11.5–15.5)
WBC: 7.6 10*3/uL (ref 4.0–10.5)
nRBC: 0 % (ref 0.0–0.2)

## 2024-02-17 LAB — MAGNESIUM: Magnesium: 1.9 mg/dL (ref 1.7–2.4)

## 2024-02-17 LAB — GLUCOSE, CAPILLARY: Glucose-Capillary: 75 mg/dL (ref 70–99)

## 2024-02-17 MED ORDER — PANTOPRAZOLE SODIUM 40 MG PO TBEC
40.0000 mg | DELAYED_RELEASE_TABLET | Freq: Every day | ORAL | Status: DC
  Administered 2024-02-18 – 2024-02-25 (×8): 40 mg via ORAL
  Filled 2024-02-17 (×8): qty 1

## 2024-02-17 MED ORDER — PREDNISONE 10 MG PO TABS
10.0000 mg | ORAL_TABLET | Freq: Every day | ORAL | Status: DC
  Administered 2024-02-17 – 2024-02-18 (×2): 10 mg via ORAL
  Filled 2024-02-17 (×2): qty 1

## 2024-02-17 MED ORDER — LOSARTAN POTASSIUM 50 MG PO TABS
50.0000 mg | ORAL_TABLET | Freq: Every day | ORAL | Status: DC
  Administered 2024-02-17 – 2024-02-25 (×9): 50 mg via ORAL
  Filled 2024-02-17 (×9): qty 1

## 2024-02-17 NOTE — Progress Notes (Signed)
Mobility Specialist - Progress Note   02/17/24 1000  Mobility  Activity Refused mobility     Pt declined mobility at this time despite encouragement, no reason specified. Will attempt another date/time.    Filiberto Pinks Mobility Specialist 02/17/24, 10:56 AM

## 2024-02-17 NOTE — Progress Notes (Signed)
[facility] Progress Note  Patient: Michelle Barnett    ZOX:[id number]  DOA: 02/01/2024     Date of Service: the patient was seen and examined on 02/17/2024  Chief Complaint  Patient presents with   Respiratory Distress   Brief hospital course: Michelle Barnett is 63 y.o. female with hypertension, hyperlipidemia, iron deficiency anemia, who presented to the ED on 02/01/2024 with acute hypoxia. Pt is currently intubated, sedated, and unable to contribute to history and no family is currently available, therefore history was obtained per chart review.  patient was previously in her PCP office where she was noted to be 66% on room air, this improved on 3 L O2 but could not get higher than 88%.  EMS was called and brought to the patient to the hospital.  In the ED she was found to be septic with a temperature of 100.7, respirations 20, tachycardic to 113 and hypoxic.  Labs were mostly unrevealing.  Patient underwent CT head, CT cervical spine, CT maxillofacial which revealed soft tissue swelling of the periorbital soft tissues of the left and along the left frontal scalp.  She received DuoNebs, azithromycin, ceftriaxone.  Hospital stay has been prolonged by ongoing hypoxia, inability to wean oxygen, and persistent delirium.  Patient has now completed her course of antibiotics.  We have initiated steroid taper, but she remains on 6 L nasal cannula.   Patient's stay was also complicated by A-fib RVR necessitating cardiology consult, amiodarone drip and heparin drip which have since been transitioned to Eliquis and amiodarone p.o.  A-fib RVR likely provoked by hypoxia and breathing treatments. Patient stay is also been complicated by lip lesions which were initially thought to be impetigo but later resulted positive for HSV.  She has had some improvement with mupirocin and was initiated on valacyclovir."  2/4: Admitted by [facility]. 2/12: Rapid response called due to witnessed Generalized Tonic Clonic seizure,  self resolved.  Never lost pulse, however severely hypoxic, required emergent intubation and mechanical ventilation for airway protection. 2/13: No significant events noted overnight.  MRI Brain last night concerning for PRESS.  Lab unable to obtain blood draws, will place central line for access.  On minimal vent support, requiring minimal Levophed.  Will perform WUQ and SBT as able. 2/14: No events noted overnight.  Plan for LP today at bedside.  On minimal vent support, will plan for WUA/SBT following LP 2/15: Pt extubated to 3L O2 via nasal canula.  Pending speech/PT/OT   2/16 patient was transferred to [facility] service.  Please review previous notes for details, and further management as below.   Assessment and Plan:  63 y.o. female admitted following a fall at home with Acute Metabolic Encephalopathy, Acute Hypoxic Respiratory Failure in the setting of Community Acquired Pneumonia and Acute Asthma Exacerbation, A.fib with RVR, Acute Kidney Injury, and HSV 1 infection. Course complicated on 02/09/24 by Generalized Tonic Clonic Seizure with severe hypoxia requiring intubation and mechanical ventilation for airway protection.    # Generalized tonic clonic seizure~resolved # Acute metabolic encephalopathy~resolved  # Concern for PRESS MRI Brain consistent with PRESS EEG no PLEDS - Treat metabolic derangements  - Avoid sedating medications as able - Continue keppra 500 mg BID dosing - Meningitis/encephalitis panel 2/14: negative  - Neurology consulted: - D/c acyclovir, less likely herpes encephalitis as per ID - Resume eliquis if no contraindication - Continue keppra 500mg  bid. It is possible that this may be weaned in several mos as an outpatient, since PRES is by  definition not a permanent condition. - Neuro will arrange for outpatient neurology f/u - Seizure precautions incl no driving x6 mos after last seizure - PT/OT/Speech consulted, started pured dysphagia 1 diet   #Acute hypoxic  respiratory failure in the setting of CAP, pulmonary edema, and asthma exacerbation - Supplemental O2 for dyspnea and/or hypoxia  - Maintain O2 sats 92% or higher  - Prn bronchodilator therapy  - Continue iv steroid taper  2/17 Mucinex 600 mg p.o. twice daily, Tussionex prn for cough 2/19 persistent hypoxia, continue supplemental admission, ABG reviewed.   2/19 CXR: Interval extubation. Persistent left retrocardiac consolidation/atelectasis. Slightly increased bibasilar opacities.  # Atrial fibrillation with rvr~ currently in NSR # HTN  # Hx: CAD and bicuspid aortic valve  # Valvular abnormality, mitral, tricuspid and aortic valves as below. Echocardiogram 02/02/24: LVEF 55-60%. Normal diastolic parameters. RV systolic function is normal.  RV is moderately enlarged, moderate MR, moderate TR, moderate AS - Continuous telemetry monitoring  -Continue Eliquis 5 mg p.o. twice daily - Once able to tolerate po's resume amiodarone, atorvastatin, and ezetimibe  - Prn labetalol and/or hydralazine for bp management  2/16 started metoprolol 50 mg p.o. twice daily 2/20 increased losartan 50 mg p.o. daily with holding parameters Utilizing IV hydralazine prn   # HSV-1 Infection~improving  # CAP~treated  - Trend WBC and monitor fever curve  - Follow culture results  S/p acyclovir, CSF negative so it has been discontinued. S/p Valtrex 1000 mg twice daily for 2 days, to complete 7-day course ID consulted, recommended antiviral therapy for 7 days   # Acute kidney Injury~resolved  # Metabolic alkalosis, suspect contraction in setting of diuresis~improving  - Trend BMP  - Strict I&O's - Avoid nephrotoxic agents as able - Replace electrolytes as indicated~pharmacy following for assistance with electrolyte replacement   # Anemia without obvious signs of bleeding  - Trend CBC  - Monitor for s/sx of bleeding  - Transfuse for hgb <7 Anemia workup within normal range 2/18 Hb 8.8, continue to  monitor  # Hypophosphatemia, Phos repleted. Resolved  Monitor electrolytes and replete as needed   Body mass index is 27.79 kg/m.  Nutrition Problem: Inadequate oral intake Etiology: inability to eat (pt sedated and ventilated) Interventions:  Diet: Pured dysphagia 1 diet DVT Prophylaxis: Eliquis  Advance goals of care discussion: Full code  Family Communication: family was present at bedside, at the time of interview.  The pt provided permission to discuss medical plan with the family. Opportunity was given to ask question and all questions were answered satisfactorily.   Disposition:  Pt is from Home, admitted with Seizures, Resp Failure, still has Resp Failure, an AMS, which precludes a safe discharge. Discharge to Home with HH, when stable, may need few days more to improve.  Subjective: No significant overnight events,  In the am she was noticed sitting on the ground by RN, pt was confused, denied any head trauma, no pain.  RN was advised to monitor and will do imaging if there is any need   Physical Exam: General: NAD, lying comfortably Appear in no distress, affect appropriate Eyes: PERRLA ENT: Oral Mucosa Clear, moist, perioral crusting due to herpes viral infection Neck: no JVD,  Cardiovascular: S1 and S2 Present, no Murmur,  Respiratory: Equal air entry bilaterally, mild bibasilar crackles, no wheezes. Abdomen: Bowel Sound present, Soft and no tenderness,  Skin: no rashes Extremities: no Pedal edema, no calf tenderness, mild edema of bilateral hands Neurologic: without any new focal findings Gait  not checked due to patient safety concerns  Vitals:   02/17/24 0416 02/17/24 0759 02/17/24 1204 02/17/24 1429  BP:  (!) 156/87 (!) 145/66 131/69  Pulse:  79 69 65  Resp:  17 15 16   Temp:  97.8 F (36.6 C) 98 F (36.7 C) 98.3 F (36.8 C)  TempSrc:  Oral Oral   SpO2:  93% 96% 99%  Weight: 58.2 kg     Height:        Intake/Output Summary (Last 24 hours) at  02/17/2024 1431 Last data filed at 02/17/2024 1048 Gross per 24 hour  Intake 180 ml  Output 400 ml  Net -220 ml   Filed Weights   02/15/24 0500 02/16/24 0500 02/17/24 0416  Weight: 60.6 kg 59.6 kg 58.2 kg    Data Reviewed: I have personally reviewed and interpreted daily labs, tele strips, imagings as discussed above. I reviewed all nursing notes, pharmacy notes, vitals, pertinent old records I have discussed plan of care as described above with RN and patient/family.  CBC: Recent Labs  Lab 02/11/24 0404 02/12/24 0406 02/13/24 0424 02/14/24 8657 02/15/24 0518 02/16/24 0529 02/17/24 0517  WBC 11.2* 7.9 7.5 12.5* 8.4 8.8 7.6  NEUTROABS 8.8* 5.8 5.5  --   --   --   --   HGB 9.9* 8.9* 8.9* 10.3* 8.8* 9.7* 8.8*  HCT 31.3* 27.7* 27.7* 32.5* 28.2* 31.0* 28.4*  MCV 95.7 96.2 96.9 97.6 99.3 99.0 99.6  PLT 204 166 145* 160 167 164 166   Basic Metabolic Panel: Recent Labs  Lab 02/13/24 0424 02/14/24 0923 02/15/24 0518 02/16/24 0529 02/17/24 0517  NA 146* 144 148* 146* 145  K 3.9 4.2 4.1 4.1 3.8  CL 104 104 106 107 105  CO2 32 32 35* 30 33*  GLUCOSE 86 86 104* 95 76  BUN 24* 24* 32* 32* 29*  CREATININE 0.80 0.82 0.81 0.79 0.72  CALCIUM 8.5* 8.7* 8.9 8.8* 8.6*  MG 2.1 1.9 2.1 2.1 1.9  PHOS 2.7 2.4* 3.7 2.2* 3.4    Studies: No results found.   Scheduled Meds:  amiodarone  200 mg Oral BID   apixaban  5 mg Oral BID   atorvastatin  40 mg Oral QHS   Chlorhexidine Gluconate Cloth  6 each Topical Daily   cyanocobalamin  1,000 mcg Oral Daily   ezetimibe  10 mg Oral Daily   feeding supplement  237 mL Oral TID BM   guaiFENesin  600 mg Oral BID   levETIRAcetam  500 mg Oral BID   losartan  50 mg Oral Daily   metoprolol tartrate  50 mg Oral BID   multivitamin with minerals  1 tablet Oral Daily   mupirocin ointment   Nasal BID   mouth rinse  15 mL Mouth Rinse 4 times per day   pantoprazole (PROTONIX) IV  40 mg Intravenous Daily   polyethylene glycol  17 g Oral Daily    predniSONE  10 mg Oral Q breakfast   sodium chloride flush  10-40 mL Intracatheter Q12H   thiamine  100 mg Oral Daily   Continuous Infusions:  levETIRAcetam      PRN Meds: acetaminophen, chlorpheniramine-HYDROcodone, hydrALAZINE, HYDROcodone-acetaminophen, ipratropium-albuterol, mouth rinse, mouth rinse, senna-docusate, sodium chloride flush  Time spent: 55 minutes  Author: Gillis Santa. MD [facility] 02/17/2024 2:31 PM  To reach On-call, see care teams to locate the attending and reach out to them via [email or web address]. If 7PM-7AM, please contact night-coverage If you still have difficulty reaching the attending  provider, please page the [facility] (Director on Call) for [facility] on amion for assistance.

## 2024-02-17 NOTE — Progress Notes (Signed)
SLP Cancellation Note  Patient Details Name: Michelle Barnett MRN: [id number] DOB: 10-Mar-1961   Cancelled treatment:       Reason Eval/Treat Not Completed: Patient declined, no reason specified   Dorothee Napierkowski 02/17/2024, 1:09 PM

## 2024-02-17 NOTE — Progress Notes (Signed)
PHARMACIST - PHYSICIAN COMMUNICATION CONCERNING: IV to Oral Route Change Policy  RECOMMENDATION: This patient is receiving pantoprazole intravenously. Based on criteria approved by the Pharmacy and Therapeutics Committee, the intravenous medication(s) is/are being converted to the equivalent dose of an oral formulation.   DESCRIPTION: These criteria include: The patient is eating (either orally or via tube) and/or has been taking other orally administered medications for at least 24 hours. The patient has no evidence of active gastrointestinal bleeding or malabsorptive GI state (gastrectomy; short bowel; patient on TNA or NPO).  If you have questions about this conversion, please contact the Pharmacy Department:  [x]   [phone] )  [facility] []   [phone] )  [facility] []   [phone] )  [facility] []   [phone] )  [facility]   Will M. Dareen Piano, PharmD Clinical Pharmacist 02/17/2024 3:10 PM

## 2024-02-17 NOTE — Plan of Care (Signed)
  Problem: Clinical Measurements: Goal: Diagnostic test results will improve Outcome: Progressing   Problem: Activity: Goal: Risk for activity intolerance will decrease Outcome: Progressing   Problem: Nutrition: Goal: Adequate nutrition will be maintained Outcome: Progressing   Problem: Coping: Goal: Level of anxiety will decrease Outcome: Progressing   Problem: Activity: Goal: Ability to tolerate increased activity will improve Outcome: Progressing

## 2024-02-17 NOTE — Progress Notes (Signed)
Nutrition Follow-up  DOCUMENTATION CODES:   Not applicable  INTERVENTION:   -Continue MVI with minerals daily -Continue Ensure Enlive po TID, each supplement provides 350 kcal and 20 grams of protein  -Continue Magic cup TID with meals, each supplement provides 290 kcal and 9 grams of protein   NUTRITION DIAGNOSIS:   Inadequate oral intake related to inability to eat (pt sedated and ventilated) as evidenced by NPO status.  Progressing; advanced to PO diet on 02/13/24   GOAL:   Patient will meet greater than or equal to 90% of their needs  Progressing   MONITOR:   PO intake, Supplement acceptance, Diet advancement  REASON FOR ASSESSMENT:   Ventilator    ASSESSMENT:   63 y/o female with h/o HTN, anxiety, depression, GERD, HLD, asthma, hiatal hernia, COPD, PUD, CAD and IDA who is admitted with PNA, HSV-1, AKI, seizures, A-fib RVR and AMS.  2/15- extubated 2/16- s/p BSE- advanced to dysphagia 1 diet with thin liquids  Reviewed I/O's: -400 ml x 24 hours and +3.7 L since 02/03/24  UOP: 400 ml x 24 hours  Pt remains on dysphagia 1 diet with thin liquids. Intake has improved. Noted meal completions 20-100%. Pt has had variable acceptance of Ensure supplements.   Wt has been stable since admission.   Medications reviewed and include vitamin B-12, keppra, protonix, miralax, prednisone, and thiamine.   Labs reviewed: CBGS: 75-112 (inpatient orders for glycemic control are none).    Diet Order:   Diet Order             DIET - DYS 1 Room service appropriate? Yes with Assist; Fluid consistency: Thin  Diet effective now                   EDUCATION NEEDS:   Education needs have been addressed  Skin:  Skin Assessment: Reviewed RN Assessment  Last BM:  02/17/24 (type 6)  Height:   Ht Readings from Last 1 Encounters:  02/01/24 4\' 9"  (1.448 m)    Weight:   Wt Readings from Last 1 Encounters:  02/17/24 58.2 kg    Ideal Body Weight:  43 kg  BMI:  Body mass  index is 27.79 kg/m.  Estimated Nutritional Needs:   Kcal:  1550-1750  Protein:  75-90 grams  Fluid:  > 1.5 L    Levada Schilling, RD, LDN, CDCES Registered Dietitian III Certified Diabetes Care and Education Specialist If unable to reach this RD, please use "RD Inpatient" group chat on secure chat between hours of 8am-4 pm daily

## 2024-02-17 NOTE — Progress Notes (Signed)
PT Cancellation Note  Patient Details Name: Michelle Barnett MRN: [id number] DOB: [birth year]   Cancelled Treatment:    Reason Eval/Treat Not Completed: Medical issues which prohibited therapy RN in room on arrival. Reports patient had fall earlier. Asks that I hold for today, maybe try tomorrow.      Sirus Labrie 02/17/2024, 2:31 PM

## 2024-02-17 NOTE — Plan of Care (Signed)
  Problem: Clinical Measurements: Goal: Cardiovascular complication will be avoided Outcome: Progressing   Problem: Activity: Goal: Risk for activity intolerance will decrease Outcome: Progressing   Problem: Nutrition: Goal: Adequate nutrition will be maintained Outcome: Progressing   Problem: Skin Integrity: Goal: Risk for impaired skin integrity will decrease Outcome: Progressing   Problem: Activity: Goal: Ability to tolerate increased activity will improve Outcome: Progressing

## 2024-02-17 NOTE — Progress Notes (Signed)
Nurse came into the room to changed the tele battery. Upon entering the room nurse saw charge nurse and two mobility techs in the room with the patient noted to be sitting on the floor.  Patient reports that she "was looking for Sherrine Maples and is worried about Sherrine Maples".   Patient denies any pain. Neuro checks WNL for the patient. Patient is noted to be confused at baseline. Vitals are WNL.  MD notified and came to bedside. No imaging ordered at this time given no complaints of pain and neurologically stable at this time.  [facility] emergency contact notified at this time at no questions at this time.   All four side rails noted to be up at the time of the fall. Call bell was within of the patient. Bed was noted to be in lowest position. Yellow socks and yellow wristband on patient Green lights were noted to be on the bottom the bed. However, bed alarm did not sound at the time of the fall.     Vitals:   02/17/24 0759 02/17/24 1204  BP: (!) 156/87 (!) 145/66  Pulse: 79 69  Resp: 17 15  Temp: 97.8 F (36.6 C) 98 F (36.7 C)  SpO2: 93% 96%

## 2024-02-18 DIAGNOSIS — J189 Pneumonia, unspecified organism: Secondary | ICD-10-CM | POA: Diagnosis not present

## 2024-02-18 LAB — BASIC METABOLIC PANEL
Anion gap: 9 (ref 5–15)
BUN: 28 mg/dL — ABNORMAL HIGH (ref 8–23)
CO2: 32 mmol/L (ref 22–32)
Calcium: 8.7 mg/dL — ABNORMAL LOW (ref 8.9–10.3)
Chloride: 103 mmol/L (ref 98–111)
Creatinine, Ser: 0.71 mg/dL (ref 0.44–1.00)
GFR, Estimated: >60 mL/min (ref >60)
Glucose, Bld: 112 mg/dL — ABNORMAL HIGH (ref 70–99)
Potassium: 4 mmol/L (ref 3.5–5.1)
Sodium: 144 mmol/L (ref 135–145)

## 2024-02-18 LAB — CBC
HCT: 29 % — ABNORMAL LOW (ref 36.0–46.0)
Hemoglobin: 9.5 g/dL — ABNORMAL LOW (ref 12.0–15.0)
MCH: 31.3 pg (ref 26.0–34.0)
MCHC: 32.8 g/dL (ref 30.0–36.0)
MCV: 95.4 fL (ref 80.0–100.0)
Platelets: 212 10*3/uL (ref 150–400)
RBC: 3.04 MIL/uL — ABNORMAL LOW (ref 3.87–5.11)
RDW: 14.9 % (ref 11.5–15.5)
WBC: 9.1 10*3/uL (ref 4.0–10.5)
nRBC: 0.2 % (ref 0.0–0.2)

## 2024-02-18 LAB — PHOSPHORUS: Phosphorus: 3 mg/dL (ref 2.5–4.6)

## 2024-02-18 LAB — MAGNESIUM: Magnesium: 1.8 mg/dL (ref 1.7–2.4)

## 2024-02-18 NOTE — Progress Notes (Signed)
Speech Language Pathology Treatment: Dysphagia  Patient Details Name: Michelle Barnett MRN: [id number] DOB: [birth year] Today's Date: 02/18/2024 Time: 4098-1191 SLP Time Calculation (min) (ACUTE ONLY): 10 min  Assessment / Plan / Recommendation Clinical Impression  Pt seen for ongoing diet management. Pt's boyfriend present during this session. Skilled observation was provided of pt consuming a snack of puree and thin liquids via straw. While pt continues with some general confusion, she was able to self-feed with cues for redirection. When discussing trials of dysphagia, nodded her head in apparent agreement with her boyfriend when he made the following statement "the food here has been superb, especially since they changed it to puree. She eats it so much better, all except for the oatmeal, she has never like oatmeal." Given pt's preference for dysphagia 1 items, soft textured were not given. ST to sign off at this time and will discontinue oatmeal.    HPI HPI: Michelle Barnett is 63 y.o. female with hypertension, hyperlipidemia, iron deficiency anemia, who presents to the ED with acute hypoxia, scabbing of lips.  Patient was previously in her PCP office where she was noted to be 66% on room air, this improved on 3 L O2 but could not get higher than 88%.  EMS was called and brought the patient to the hospital.  In the ED she was found to be septic with a temperature of 100.7, respirations 20, tachycardic to 113 and hypoxic.  Labs were mostly unrevealing.  Patient underwent CT head, CT cervical spine, CT maxillofacial which revealed soft tissue swelling of the periorbital soft tissues of the left and along the left frontal scalp.  She received DuoNebs, azithromycin, ceftriaxone. Pt admitted for right middle lobe PNA with acute hypoxic respiratory failure. Pt is currently on 6L nasal canula. Pt with chronic hiatal hernia. CXR 2/7: Ventilation not significantly changed since 02/01/2024. Veiling lung base  opacity superimposed on large gastric hiatal hernia. Differential considerations remain pneumonia, atelectasis, pleural effusions. Chest CT 2/7: Small bilateral pleural effusions. Consolidation in both lower lobes concerning for pneumonia at admit.  Post admit per chart notes:  Rapid response called due to witnessed Generalized Tonic Clonic seizure, self resolved.  Never lost pulse, however severely hypoxic, required emergent intubation and mechanical ventilation for airway protection.  2/13: No significant events noted overnight.  MRI Brain last night concerning for PRESS.  Lab unable to obtain blood draws, will place central line for access.  On minimal vent support, requiring minimal Levophed.  Will perform WUQ and SBT as able.  2/14: No events noted overnight.  Plan for LP today at bedside.  On minimal vent support, will plan for WUA/SBT following LP  2/15: Pt extubated to 3L O2 via nasal canula.      SLP Plan  All goals met      Recommendations for follow up therapy are one component of a multi-disciplinary discharge planning process, led by the attending physician.  Recommendations may be updated based on patient status, additional functional criteria and insurance authorization.    Recommendations  Diet recommendations: Dysphagia 1 (puree);Thin liquid Liquids provided via: Cup;Straw Medication Administration: Crushed with puree Supervision: Staff to assist with self feeding;Full supervision/cueing for compensatory strategies;Intermittent supervision to cue for compensatory strategies;Trained caregiver to feed patient Compensations: Minimize environmental distractions;Slow rate;Small sips/bites Postural Changes and/or Swallow Maneuvers: Seated upright 90 degrees                  Oral care BID;Oral care before and after PO;Staff/trained caregiver to  provide oral care   Frequent or constant Supervision/Assistance Dysphagia, unspecified (R13.10);Cognitive communication deficit  (R41.841)     All goals met    Jamaree Hosier B. Dreama Saa, M.S., CCC-SLP, Tree surgeon Certified Brain Injury Specialist [facility]  [facility] Rehabilitation Services Office [phone] Ascom [phone] Fax [phone]

## 2024-02-18 NOTE — Progress Notes (Signed)
Physical Therapy Treatment Patient Details Name: Michelle Barnett MRN: [id number] DOB: Mar 02, 1961 Today's Date: 02/18/2024   History of Present Illness Pt is a 63 y.o. female admitted following a fall at home with acute metabolic encephalopathy, acute hypoxic respiratory failure in the setting of CAP and acute asthma exacerbation, A.fib with RVR, AKI, anemia without obvious signs of bleeding, and HSV 1 infection. Course complicated on 02/09/24 by generalized tonic clonic seizure with severe hypoxia requiring intubation and mechanical ventilation for airway protection. Pt extubated 02/12/24.    PT Comments  Pt was long sitting in bed with boyfriend at bedside. Pt remains disoriented and confused however agreeable to session. Tcs/vcs throughout for desired task requested of her. Author encouraged boyfriend to allow time for pt to perform desired task. Pt's boyfriend tends to try to over assist pt at times. Pt was able to exit R side of bed, stand to RW, and tolerate ambulation to doorway of room. She remains high fall risk due to poor safety awareness, poor balance, and poor activity tolerance.      If plan is discharge home, recommend the following: A lot of help with walking and/or transfers;A lot of help with bathing/dressing/bathroom;Assistance with cooking/housework;Direct supervision/assist for medications management;Direct supervision/assist for financial management;Assist for transportation;Help with stairs or ramp for entrance;Supervision due to cognitive status     Equipment Recommendations  Other (comment) (Defer to next level of care)       Precautions / Restrictions Precautions Precautions: Fall Recall of Precautions/Restrictions: Impaired Restrictions Weight Bearing Restrictions Per Provider Order: No     Mobility  Bed Mobility Overal bed mobility: Needs Assistance Bed Mobility: Supine to Sit, Sit to Supine  Supine to sit: Min assist Sit to supine: Mod assist General bed  mobility comments: less overall assistance required to exit bed. required mod assist to progress BLEs back into bed after OOB activity    Transfers Overall transfer level: Needs assistance Equipment used: Rolling walker (2 wheels) Transfers: Sit to/from Stand Sit to Stand: Min assist  General transfer comment: mod assist of one to stand EOB> RW. vcs and tcs for handplacement and technique improvements    Ambulation/Gait Ambulation/Gait assistance: Min assist, Mod assist Gait Distance (Feet): 25 Feet Assistive device: Rolling walker (2 wheels) Gait Pattern/deviations: Step-through pattern, Trunk flexed Gait velocity: decreased  General Gait Details: pt was able to ambulate from EOB to doorway of room and back to bed. pt has poor overall safety but seemed to tolerate short ambulation well. Pt remains high fall risk    Balance Overall balance assessment: Needs assistance Sitting-balance support: Bilateral upper extremity supported, Feet supported Sitting balance-Leahy Scale: Fair Sitting balance - Comments: CGA for safety due to cognition   Standing balance support: Bilateral upper extremity supported, During functional activity, Reliant on assistive device for balance Standing balance-Leahy Scale: Poor Standing balance comment: pt remains high fall risk        Cognition Arousal: Lethargic (however improved form previous PT sessions per boyfriend) Behavior During Therapy: Flat affect, WFL for tasks assessed/performed   PT - Cognitive impairments: Awareness, Difficult to assess, Safety/Judgement   Orientation impairments: Situation, Time                   PT - Cognition Comments: pt was able to follow simple commands with increased time and vc+tcs Following commands: Impaired Following commands impaired: Follows one step commands inconsistently    Cueing Cueing Techniques: Verbal cues, Tactile cues, Gestural cues  Pertinent Vitals/Pain Pain Assessment Pain  Assessment: No/denies pain Breathing: normal     PT Goals (current goals can now be found in the care plan section) Acute Rehab PT Goals Patient Stated Goal: "can you walk me to the car?" Progress towards PT goals: Progressing toward goals    Frequency    Min 1X/week           Co-evaluation     PT goals addressed during session: Mobility/safety with mobility;Balance;Proper use of DME;Strengthening/ROM        AM-PAC PT "6 Clicks" Mobility   Outcome Measure  Help needed turning from your back to your side while in a flat bed without using bedrails?: A Lot Help needed moving from lying on your back to sitting on the side of a flat bed without using bedrails?: A Lot Help needed moving to and from a bed to a chair (including a wheelchair)?: A Lot Help needed standing up from a chair using your arms (e.g., wheelchair or bedside chair)?: A Lot Help needed to walk in hospital room?: A Lot Help needed climbing 3-5 steps with a railing? : Total 6 Click Score: 11    End of Session Equipment Utilized During Treatment: Gait belt;Oxygen Activity Tolerance: Patient tolerated treatment well Patient left: in bed;with call bell/phone within reach;with bed alarm set;with nursing/sitter in room Nurse Communication: Mobility status PT Visit Diagnosis: Muscle weakness (generalized) (M62.81);Difficulty in walking, not elsewhere classified (R26.2);Unsteadiness on feet (R26.81)     Time: 8416-6063 PT Time Calculation (min) (ACUTE ONLY): 17 min  Charges:    $Gait Training: 8-22 mins PT General Charges $$ ACUTE PT VISIT: 1 Visit                     Jetta Lout PTA 02/18/24, 2:32 PM

## 2024-02-18 NOTE — Progress Notes (Signed)
[facility] Progress Note  Patient: Michelle Barnett    ZOX:[id number]  DOA: 02/01/2024     Date of Service: the patient was seen and examined on 02/18/2024  Chief Complaint  Patient presents with   Respiratory Distress   Brief hospital course: Michelle Barnett is 64 y.o. female with hypertension, hyperlipidemia, iron deficiency anemia, who presented to the ED on 02/01/2024 with acute hypoxia. Pt is currently intubated, sedated, and unable to contribute to history and no family is currently available, therefore history was obtained per chart review.  patient was previously in her PCP office where she was noted to be 66% on room air, this improved on 3 L O2 but could not get higher than 88%.  EMS was called and brought to the patient to the hospital.  In the ED she was found to be septic with a temperature of 100.7, respirations 20, tachycardic to 113 and hypoxic.  Labs were mostly unrevealing.  Patient underwent CT head, CT cervical spine, CT maxillofacial which revealed soft tissue swelling of the periorbital soft tissues of the left and along the left frontal scalp.  She received DuoNebs, azithromycin, ceftriaxone.  Hospital stay has been prolonged by ongoing hypoxia, inability to wean oxygen, and persistent delirium.  Patient has now completed her course of antibiotics.  We have initiated steroid taper, but she remains on 6 L nasal cannula.   Patient's stay was also complicated by A-fib RVR necessitating cardiology consult, amiodarone drip and heparin drip which have since been transitioned to Eliquis and amiodarone p.o.  A-fib RVR likely provoked by hypoxia and breathing treatments. Patient stay is also been complicated by lip lesions which were initially thought to be impetigo but later resulted positive for HSV.  She has had some improvement with mupirocin and was initiated on valacyclovir."  2/4: Admitted by [facility]. 2/12: Rapid response called due to witnessed Generalized Tonic Clonic seizure,  self resolved.  Never lost pulse, however severely hypoxic, required emergent intubation and mechanical ventilation for airway protection. 2/13: No significant events noted overnight.  MRI Brain last night concerning for PRESS.  Lab unable to obtain blood draws, will place central line for access.  On minimal vent support, requiring minimal Levophed.  Will perform WUQ and SBT as able. 2/14: No events noted overnight.  Plan for LP today at bedside.  On minimal vent support, will plan for WUA/SBT following LP 2/15: Pt extubated to 3L O2 via nasal canula.  Pending speech/PT/OT   2/16 patient was transferred to [facility] service.  Please review previous notes for details, and further management as below.   Assessment and Plan:  63 y.o. female admitted following a fall at home with Acute Metabolic Encephalopathy, Acute Hypoxic Respiratory Failure in the setting of Community Acquired Pneumonia and Acute Asthma Exacerbation, A.fib with RVR, Acute Kidney Injury, and HSV 1 infection. Course complicated on 02/09/24 by Generalized Tonic Clonic Seizure with severe hypoxia requiring intubation and mechanical ventilation for airway protection.    # Generalized tonic clonic seizure~resolved # Acute metabolic encephalopathy~resolved  # Concern for PRESS MRI Brain consistent with PRESS EEG no PLEDS - Treat metabolic derangements  - Avoid sedating medications as able - Continue keppra 500 mg BID dosing - Meningitis/encephalitis panel 2/14: negative  - Neurology consulted: - D/c acyclovir, less likely herpes encephalitis as per ID - Resume eliquis if no contraindication - Continue keppra 500mg  bid. It is possible that this may be weaned in several mos as an outpatient, since PRES is by  definition not a permanent condition. - Neuro will arrange for outpatient neurology f/u - Seizure precautions incl no driving x6 mos after last seizure - PT/OT/Speech consulted, started pured dysphagia 1 diet   #Acute hypoxic  respiratory failure in the setting of CAP, pulmonary edema, and asthma exacerbation - Supplemental O2 for dyspnea and/or hypoxia  - Maintain O2 sats 92% or higher  - Prn bronchodilator therapy  - Continue iv steroid taper  2/17 Mucinex 600 mg p.o. twice daily, Tussionex prn for cough 2/19 persistent hypoxia, continue supplemental admission, ABG reviewed.   2/19 CXR: Interval extubation. Persistent left retrocardiac consolidation/atelectasis. Slightly increased bibasilar opacities.  # Atrial fibrillation with rvr~ currently in NSR # HTN  # Hx: CAD and bicuspid aortic valve  # Valvular abnormality, mitral, tricuspid and aortic valves as below. Echocardiogram 02/02/24: LVEF 55-60%. Normal diastolic parameters. RV systolic function is normal.  RV is moderately enlarged, moderate MR, moderate TR, moderate AS - Continuous telemetry monitoring  -Continue Eliquis 5 mg p.o. twice daily - Once able to tolerate po's resume amiodarone, atorvastatin, and ezetimibe  - Prn labetalol and/or hydralazine for bp management  2/16 started metoprolol 50 mg p.o. twice daily 2/20 increased losartan 50 mg p.o. daily with holding parameters Use IV hydralazine prn   # HSV-1 Infection~improving  # CAP~treated  - Trend WBC and monitor fever curve  - Follow culture results  S/p acyclovir, CSF negative so it has been discontinued. S/p Valtrex 1000 mg twice daily for 2 days, to complete 7-day course ID consulted, recommended antiviral therapy for 7 days   # Acute kidney Injury~resolved  # Metabolic alkalosis, suspect contraction in setting of diuresis~improving  - Trend BMP  - Strict I&O's - Avoid nephrotoxic agents as able - Replace electrolytes as indicated~pharmacy following for assistance with electrolyte replacement   # Anemia without obvious signs of bleeding  - Trend CBC  - Monitor for s/sx of bleeding  - Transfuse for hgb <7 Anemia workup within normal range 2/18 Hb 8.8, continue to monitor  #  Hypophosphatemia, Phos repleted. Resolved  Monitor electrolytes and replete as needed   Body mass index is 27.53 kg/m.  Nutrition Problem: Inadequate oral intake Etiology: inability to eat (pt sedated and ventilated) Interventions:  Diet: Pured dysphagia 1 diet DVT Prophylaxis: Eliquis  Advance goals of care discussion: Full code  Family Communication: family was present at bedside, at the time of interview.  The pt provided permission to discuss medical plan with the family. Opportunity was given to ask question and all questions were answered satisfactorily.   Disposition:  Pt is from Home, admitted with Seizures, Resp Failure, still has Resp Failure, an AMS, which precludes a safe discharge. Discharge to SNF, when stable, may need few days more to improve. Follow TOC for placement  Subjective: No significant overnight events, patient woke up with calling name, she was able to tell me her name and date of birth.  She knows the ER but does not know month and location. Patient is intermittently confused, denied any complaints. Management plan discussed with her partner at bedside.  Who agreed for SNF placement.   Physical Exam: General: NAD, lying comfortably Appear in no distress, affect appropriate Eyes: PERRLA ENT: Oral Mucosa Clear, moist, resolving perioral herpes crusting Neck: no JVD,  Cardiovascular: S1 and S2 Present, no Murmur,  Respiratory: Equal air entry bilaterally, mild bibasilar crackles, no wheezes. Abdomen: Bowel Sound present, Soft and no tenderness,  Skin: no rashes Extremities: no Pedal edema, no calf  tenderness, mild edema of bilateral hands Neurologic: without any new focal findings Gait not checked due to patient safety concerns  Vitals:   02/17/24 2154 02/17/24 2336 02/18/24 0414 02/18/24 0923  BP: 128/71 132/64  128/68  Pulse: 66 (!) 55  71  Resp:  19  17  Temp:  (!) 97.5 F (36.4 C)  99.1 F (37.3 C)  TempSrc:  Oral    SpO2:  98%  91%   Weight:   57.7 kg   Height:        Intake/Output Summary (Last 24 hours) at 02/18/2024 1533 Last data filed at 02/18/2024 1044 Gross per 24 hour  Intake 0 ml  Output --  Net 0 ml   Filed Weights   02/16/24 0500 02/17/24 0416 02/18/24 0414  Weight: 59.6 kg 58.2 kg 57.7 kg    Data Reviewed: I have personally reviewed and interpreted daily labs, tele strips, imagings as discussed above. I reviewed all nursing notes, pharmacy notes, vitals, pertinent old records I have discussed plan of care as described above with RN and patient/family.  CBC: Recent Labs  Lab 02/12/24 0406 02/13/24 0424 02/14/24 1610 02/15/24 0518 02/16/24 0529 02/17/24 0517 02/18/24 1009  WBC 7.9 7.5 12.5* 8.4 8.8 7.6 9.1  NEUTROABS 5.8 5.5  --   --   --   --   --   HGB 8.9* 8.9* 10.3* 8.8* 9.7* 8.8* 9.5*  HCT 27.7* 27.7* 32.5* 28.2* 31.0* 28.4* 29.0*  MCV 96.2 96.9 97.6 99.3 99.0 99.6 95.4  PLT 166 145* 160 167 164 166 212   Basic Metabolic Panel: Recent Labs  Lab 02/14/24 0923 02/15/24 0518 02/16/24 0529 02/17/24 0517 02/18/24 1009  NA 144 148* 146* 145 144  K 4.2 4.1 4.1 3.8 4.0  CL 104 106 107 105 103  CO2 32 35* 30 33* 32  GLUCOSE 86 104* 95 76 112*  BUN 24* 32* 32* 29* 28*  CREATININE 0.82 0.81 0.79 0.72 0.71  CALCIUM 8.7* 8.9 8.8* 8.6* 8.7*  MG 1.9 2.1 2.1 1.9 1.8  PHOS 2.4* 3.7 2.2* 3.4 3.0    Studies: No results found.   Scheduled Meds:  amiodarone  200 mg Oral BID   apixaban  5 mg Oral BID   atorvastatin  40 mg Oral QHS   Chlorhexidine Gluconate Cloth  6 each Topical Daily   cyanocobalamin  1,000 mcg Oral Daily   ezetimibe  10 mg Oral Daily   feeding supplement  237 mL Oral TID BM   guaiFENesin  600 mg Oral BID   levETIRAcetam  500 mg Oral BID   losartan  50 mg Oral Daily   metoprolol tartrate  50 mg Oral BID   multivitamin with minerals  1 tablet Oral Daily   mupirocin ointment   Nasal BID   mouth rinse  15 mL Mouth Rinse 4 times per day   pantoprazole  40 mg Oral  Daily   polyethylene glycol  17 g Oral Daily   predniSONE  10 mg Oral Q breakfast   sodium chloride flush  10-40 mL Intracatheter Q12H   thiamine  100 mg Oral Daily   Continuous Infusions:  levETIRAcetam      PRN Meds: acetaminophen, chlorpheniramine-HYDROcodone, hydrALAZINE, HYDROcodone-acetaminophen, ipratropium-albuterol, mouth rinse, mouth rinse, senna-docusate, sodium chloride flush  Time spent: 40 minutes  Author: Gillis Santa. MD [facility] 02/18/2024 3:33 PM  To reach On-call, see care teams to locate the attending and reach out to them via [email or web address]. If 7PM-7AM, please contact  night-coverage If you still have difficulty reaching the attending provider, please page the [facility] (Director on Call) for [facility] on amion for assistance.

## 2024-02-18 NOTE — Plan of Care (Signed)
  Problem: Clinical Measurements: Goal: Diagnostic test results will improve Outcome: Progressing   Problem: Activity: Goal: Risk for activity intolerance will decrease Outcome: Progressing   Problem: Nutrition: Goal: Adequate nutrition will be maintained Outcome: Progressing   Problem: Coping: Goal: Level of anxiety will decrease Outcome: Progressing   Problem: Activity: Goal: Ability to tolerate increased activity will improve Outcome: Progressing   Problem: Activity: Goal: Ability to tolerate increased activity will improve Outcome: Progressing

## 2024-02-19 DIAGNOSIS — J189 Pneumonia, unspecified organism: Secondary | ICD-10-CM | POA: Diagnosis not present

## 2024-02-19 LAB — BASIC METABOLIC PANEL
Anion gap: 7 (ref 5–15)
BUN: 29 mg/dL — ABNORMAL HIGH (ref 8–23)
CO2: 36 mmol/L — ABNORMAL HIGH (ref 22–32)
Calcium: 8.6 mg/dL — ABNORMAL LOW (ref 8.9–10.3)
Chloride: 101 mmol/L (ref 98–111)
Creatinine, Ser: 0.78 mg/dL (ref 0.44–1.00)
GFR, Estimated: >60 mL/min (ref >60)
Glucose, Bld: 91 mg/dL (ref 70–99)
Potassium: 4 mmol/L (ref 3.5–5.1)
Sodium: 144 mmol/L (ref 135–145)

## 2024-02-19 LAB — MAGNESIUM: Magnesium: 2 mg/dL (ref 1.7–2.4)

## 2024-02-19 LAB — CBC
HCT: 30.4 % — ABNORMAL LOW (ref 36.0–46.0)
Hemoglobin: 9.4 g/dL — ABNORMAL LOW (ref 12.0–15.0)
MCH: 31.2 pg (ref 26.0–34.0)
MCHC: 30.9 g/dL (ref 30.0–36.0)
MCV: 101 fL — ABNORMAL HIGH (ref 80.0–100.0)
Platelets: 182 10*3/uL (ref 150–400)
RBC: 3.01 MIL/uL — ABNORMAL LOW (ref 3.87–5.11)
RDW: 15 % (ref 11.5–15.5)
WBC: 7.1 10*3/uL (ref 4.0–10.5)
nRBC: 0 % (ref 0.0–0.2)

## 2024-02-19 LAB — PHOSPHORUS: Phosphorus: 3.6 mg/dL (ref 2.5–4.6)

## 2024-02-19 MED ORDER — PREDNISONE 20 MG PO TABS
20.0000 mg | ORAL_TABLET | Freq: Every day | ORAL | Status: DC
  Administered 2024-02-19 – 2024-02-20 (×2): 20 mg via ORAL
  Filled 2024-02-19: qty 1

## 2024-02-19 NOTE — Plan of Care (Signed)

## 2024-02-19 NOTE — Progress Notes (Signed)
 PROGRESS NOTE    Michelle Barnett  WUJ:[id number] DOB: [birth year] DOA: 02/01/2024 PCP: Doreene Nest, NP    Brief Narrative:   63 y.o. female with hypertension, hyperlipidemia, iron deficiency anemia, who presented to the ED on 02/01/2024 with acute hypoxia. Pt is currently intubated, sedated, and unable to contribute to history and no family is currently available, therefore history was obtained per chart review.  patient was previously in her PCP office where she was noted to be 66% on room air, this improved on 3 L O2 but could not get higher than 88%.  EMS was called and brought to the patient to the hospital.  In the ED she was found to be septic with a temperature of 100.7, respirations 20, tachycardic to 113 and hypoxic.  Labs were mostly unrevealing.  Patient underwent CT head, CT cervical spine, CT maxillofacial which revealed soft tissue swelling of the periorbital soft tissues of the left and along the left frontal scalp.  She received DuoNebs, azithromycin, ceftriaxone.  Hospital stay has been prolonged by ongoing hypoxia, inability to wean oxygen, and persistent delirium.  Patient has now completed her course of antibiotics.  We have initiated steroid taper, but she remains on 6 L nasal cannula.   Patient's stay was also complicated by A-fib RVR necessitating cardiology consult, amiodarone drip and heparin drip which have since been transitioned to Eliquis and amiodarone p.o.  A-fib RVR likely provoked by hypoxia and breathing treatments. Patient stay is also been complicated by lip lesions which were initially thought to be impetigo but later resulted positive for HSV.  She has had some improvement with mupirocin and was initiated on valacyclovir."   2/4: Admitted by [facility]. 2/12: Rapid response called due to witnessed Generalized Tonic Clonic seizure, self resolved.  Never lost pulse, however severely hypoxic, required emergent intubation and mechanical ventilation for airway  protection. 2/13: No significant events noted overnight.  MRI Brain last night concerning for PRESS.  Lab unable to obtain blood draws, will place central line for access.  On minimal vent support, requiring minimal Levophed.  Will perform WUQ and SBT as able. 2/14: No events noted overnight.  Plan for LP today at bedside.  On minimal vent support, will plan for WUA/SBT following LP 2/15: Pt extubated to 3L O2 via nasal canula.  Pending speech/PT/OT    2/16 patient was transferred to [facility] service.  Please review previous notes for details, and further management as below.   Assessment & Plan:   Principal Problem:   Pneumonia of right lower lobe due to infectious organism Active Problems:   AKI (acute kidney injury) (HCC)   Essential hypertension   Vitamin B12 deficiency   Adjustment disorder with mixed anxiety and depressed mood   Acute hypoxic respiratory failure (HCC)   GERD (gastroesophageal reflux disease)   Mild intermittent asthma without complication   Hyperlipidemia   Hypoxia   Ecchymosis of left eye   Sepsis (HCC)   Paroxysmal atrial fibrillation (HCC)   Atrial fibrillation with RVR (HCC)   Hiatal hernia   Pneumonia   HSV-1 infection   Acute encephalopathy # Generalized tonic clonic seizure~resolved # Acute metabolic encephalopathy~resolved  # Concern for PRESS MRI Brain consistent with PRESS EEG no PLEDS - Treat metabolic derangements  - Avoid sedating medications as able - Continue keppra 500 mg BID dosing - Meningitis/encephalitis panel 2/14: negative  - Neurology consulted: - D/c acyclovir, less likely herpes encephalitis as per ID Plan: Continue Keppra 500 twice daily Continue  Eliquis 5 g twice daily Daily PT OT speech Continue dysphagia 1 diet Seizure precautions    #Acute hypoxic respiratory failure in the setting of CAP, pulmonary edema, and asthma exacerbation - Supplemental O2 for dyspnea and/or hypoxia  - Maintain O2 sats 92% or higher  - Prn  bronchodilator therapy  -Continue p.o. prednisone, taper slowly 2/17 Mucinex 600 mg p.o. twice daily, Tussionex prn for cough 2/19 persistent hypoxia, continue supplemental admission, ABG reviewed.   2/19 CXR: Interval extubation. Persistent left retrocardiac consolidation/atelectasis. Slightly increased bibasilar opacities.   # Atrial fibrillation with rvr~ currently in NSR # HTN  # Hx: CAD and bicuspid aortic valve  # Valvular abnormality, mitral, tricuspid and aortic valves as below. Echocardiogram 02/02/24: LVEF 55-60%. Normal diastolic parameters. RV systolic function is normal.  RV is moderately enlarged, moderate MR, moderate TR, moderate AS - Continuous telemetry monitoring  -Continue Eliquis 5 mg p.o. twice daily - Once able to tolerate po's resume amiodarone, atorvastatin, and ezetimibe  - Prn labetalol and/or hydralazine for bp management  2/16 started metoprolol 50 mg p.o. twice daily 2/20 increased losartan 50 mg p.o. daily with holding parameters Use IV hydralazine prn    # HSV-1 Infection~improving  # CAP~treated  - Trend WBC and monitor fever curve  - Follow culture results  S/p acyclovir, CSF negative so it has been discontinued. S/p Valtrex 1000 mg twice daily for 2 days, to complete 7-day course, completed     # Acute kidney Injury~resolved  # Metabolic alkalosis, suspect contraction in setting of diuresis~improving  - Trend BMP  - Strict I&O's - Avoid nephrotoxic agents as able - Replace electrolytes as indicated~pharmacy following for assistance with electrolyte replacement   # Anemia without obvious signs of bleeding  - Trend CBC  - Monitor for s/sx of bleeding  - Transfuse for hgb <7 Anemia workup within normal range 2/18 Hb 8.8, continue to monitor   # Hypophosphatemia, Phos repleted. Resolved  Monitor electrolytes and replete as needed    DVT prophylaxis: Eliquis Code Status: Full Family Communication: Acquaintance at bedside 2/22 Disposition  Plan: Status is: Inpatient Remains inpatient appropriate because: Multiple acute issues as above   Level of care: Telemetry Medical  Consultants:  None  Procedures:  None  Antimicrobials: None    Subjective: Seen and examined sitting in bed.  No visible distress.  Reports improvement.  Objective: Vitals:   02/18/24 1500 02/18/24 2247 02/19/24 0419 02/19/24 0756  BP: 124/62 (!) 110/59  (!) 145/60  Pulse: 68 (!) 56  66  Resp: 17 20  18   Temp: 97.9 F (36.6 C) 97.7 F (36.5 C)  98.4 F (36.9 C)  TempSrc:    Oral  SpO2: 93% 100%  96%  Weight:   58.5 kg   Height:        Intake/Output Summary (Last 24 hours) at 02/19/2024 1028 Last data filed at 02/19/2024 0500 Gross per 24 hour  Intake 240 ml  Output 450 ml  Net -210 ml   Filed Weights   02/17/24 0416 02/18/24 0414 02/19/24 0419  Weight: 58.2 kg 57.7 kg 58.5 kg    Examination:  General exam: Appears fatigued and chronically ill Respiratory system: Bibasilar crackles.  Normal work of breathing.  Room air Cardiovascular system: S1-S2, RRR, no murmurs, no pedal edema Gastrointestinal system: Thin, soft, NT/ND, normal bowel sounds Central nervous system: Alert and oriented. No focal neurological deficits. Extremities: Symmetric decreased power.  Gait not assessed Skin: No rashes, lesions or ulcers Psychiatry: Judgement  and insight appear normal. Mood & affect appropriate.     Data Reviewed: I have personally reviewed following labs and imaging studies  CBC: Recent Labs  Lab 02/13/24 0424 02/14/24 0923 02/15/24 0518 02/16/24 0529 02/17/24 0517 02/18/24 1009 02/19/24 0414  WBC 7.5   < > 8.4 8.8 7.6 9.1 7.1  NEUTROABS 5.5  --   --   --   --   --   --   HGB 8.9*   < > 8.8* 9.7* 8.8* 9.5* 9.4*  HCT 27.7*   < > 28.2* 31.0* 28.4* 29.0* 30.4*  MCV 96.9   < > 99.3 99.0 99.6 95.4 101.0*  PLT 145*   < > 167 164 166 212 182   < > = values in this interval not displayed.   Basic Metabolic Panel: Recent Labs   Lab 02/15/24 0518 02/16/24 0529 02/17/24 0517 02/18/24 1009 02/19/24 0414  NA 148* 146* 145 144 144  K 4.1 4.1 3.8 4.0 4.0  CL 106 107 105 103 101  CO2 35* 30 33* 32 36*  GLUCOSE 104* 95 76 112* 91  BUN 32* 32* 29* 28* 29*  CREATININE 0.81 0.79 0.72 0.71 0.78  CALCIUM 8.9 8.8* 8.6* 8.7* 8.6*  MG 2.1 2.1 1.9 1.8 2.0  PHOS 3.7 2.2* 3.4 3.0 3.6   GFR: Estimated Creatinine Clearance: 53 mL/min (by C-G formula based on SCr of 0.78 mg/dL). Liver Function Tests: Recent Labs  Lab 02/13/24 0424  AST 33  ALT 42  ALKPHOS 34*  BILITOT 0.9  PROT 5.2*  ALBUMIN 2.6*   No results for input(s): "LIPASE", "AMYLASE" in the last 168 hours. No results for input(s): "AMMONIA" in the last 168 hours. Coagulation Profile: No results for input(s): "INR", "PROTIME" in the last 168 hours. Cardiac Enzymes: No results for input(s): "CKTOTAL", "CKMB", "CKMBINDEX", "TROPONINI" in the last 168 hours. BNP (last 3 results) No results for input(s): "PROBNP" in the last 8760 hours. HbA1C: No results for input(s): "HGBA1C" in the last 72 hours. CBG: Recent Labs  Lab 02/15/24 0814 02/15/24 2116 02/16/24 0809 02/16/24 2311 02/17/24 0651  GLUCAP 90 135* 99 112* 75   Lipid Profile: No results for input(s): "CHOL", "HDL", "LDLCALC", "TRIG", "CHOLHDL", "LDLDIRECT" in the last 72 hours. Thyroid Function Tests: No results for input(s): "TSH", "T4TOTAL", "FREET4", "T3FREE", "THYROIDAB" in the last 72 hours. Anemia Panel: No results for input(s): "VITAMINB12", "FOLATE", "FERRITIN", "TIBC", "IRON", "RETICCTPCT" in the last 72 hours. Sepsis Labs: No results for input(s): "PROCALCITON", "LATICACIDVEN" in the last 168 hours.  Recent Results (from the past 240 hours)  MRSA Next Gen by PCR, Nasal     Status: None   Collection Time: 02/09/24  8:30 PM   Specimen: Nasal Mucosa; Nasal Swab  Result Value Ref Range Status   MRSA by PCR Next Gen NOT DETECTED NOT DETECTED Final    Comment: (NOTE) The GeneXpert  MRSA Assay (FDA approved for NASAL specimens only), is one component of a comprehensive MRSA colonization surveillance program. It is not intended to diagnose MRSA infection nor to guide or monitor treatment for MRSA infections. Test performance is not FDA approved in patients less than 41 years old. Performed at [facility], [address] [place], [place] [address]   CSF culture w Gram Stain     Status: None   Collection Time: 02/11/24  4:27 PM   Specimen: CSF; Cerebrospinal Fluid  Result Value Ref Range Status   Specimen Description   Final    CSF Performed  at [facility], [address] [place], [place] [address]    Special Requests   Final    NONE Performed at [facility], [address] [place], [place] [address]    Gram Stain   Final    NO ORGANISMS SEEN WBC SEEN RED BLOOD CELLS PRESENT Performed at [facility], [address] [place], [place] [address]    Culture   Final    NO GROWTH 3 DAYS Performed at [facility], [address], [place], [place] [address]    Report Status 02/14/2024 FINAL  Final  Culture, fungus without smear     Status: None (Preliminary result)   Collection Time: 02/11/24  4:27 PM   Specimen: CSF; Cerebrospinal Fluid  Result Value Ref Range Status   Specimen Description   Final    CSF Performed at [facility], [address], [place], [place] [address]    Special Requests   Final    NONE Performed at [facility], [address] [place], [place] [address]    Culture   Final    NO FUNGUS ISOLATED AFTER 7 DAYS Performed at [facility], [address] [place], [place] [address]    Report Status PENDING  Incomplete  Anaerobic culture w Gram Stain     Status: None   Collection Time: 02/11/24  4:27 PM   Specimen: CSF; Cerebrospinal Fluid  Result Value Ref Range Status   Specimen Description   Final    CSF Performed at [facility], [address] [place], [place] [address]    Special Requests   Final    NONE Performed at [facility], [address] [place], [place] [address]    Gram Stain   Final    WBC PRESENT, PREDOMINANTLY MONONUCLEAR NO ORGANISMS SEEN CYTOSPIN SMEAR    Culture   Final    NO ANAEROBES ISOLATED Performed at [facility], [address], [place], [place] [address]    Report Status 02/16/2024 FINAL  Final         Radiology Studies: No results found.      Scheduled Meds:  amiodarone  200 mg Oral BID   apixaban  5 mg Oral BID   atorvastatin  40 mg Oral QHS   Chlorhexidine Gluconate Cloth  6 each Topical Daily   cyanocobalamin  1,000 mcg Oral Daily   ezetimibe  10 mg Oral Daily   feeding supplement  237 mL Oral TID BM   guaiFENesin  600 mg Oral BID   levETIRAcetam  500 mg Oral BID   losartan  50 mg Oral Daily   metoprolol tartrate  50 mg Oral BID   multivitamin with minerals  1 tablet Oral Daily   mupirocin ointment   Nasal BID   mouth rinse  15 mL Mouth Rinse 4 times per day   pantoprazole  40 mg Oral Daily   polyethylene glycol  17 g Oral Daily   sodium chloride flush  10-40 mL Intracatheter Q12H   thiamine  100 mg Oral Daily   Continuous Infusions:  levETIRAcetam       LOS: 18 days    Tresa Moore, MD [facility]   If 7PM-7AM, please contact night-coverage  02/19/2024, 10:28 AM

## 2024-02-20 ENCOUNTER — Inpatient Hospital Stay: Payer: No Typology Code available for payment source

## 2024-02-20 DIAGNOSIS — J189 Pneumonia, unspecified organism: Secondary | ICD-10-CM | POA: Diagnosis not present

## 2024-02-20 LAB — BRAIN NATRIURETIC PEPTIDE: B Natriuretic Peptide: 206.2 pg/mL — ABNORMAL HIGH (ref 0.0–100.0)

## 2024-02-20 MED ORDER — PREDNISONE 20 MG PO TABS
20.0000 mg | ORAL_TABLET | Freq: Once | ORAL | Status: AC
  Administered 2024-02-20: 20 mg via ORAL
  Filled 2024-02-20: qty 1

## 2024-02-20 MED ORDER — FUROSEMIDE 40 MG PO TABS
40.0000 mg | ORAL_TABLET | Freq: Once | ORAL | Status: AC
  Administered 2024-02-20: 40 mg via ORAL
  Filled 2024-02-20: qty 1

## 2024-02-20 MED ORDER — QUETIAPINE FUMARATE 25 MG PO TABS
25.0000 mg | ORAL_TABLET | Freq: Every day | ORAL | Status: DC
  Administered 2024-02-20 – 2024-02-22 (×3): 25 mg via ORAL
  Filled 2024-02-20 (×3): qty 1

## 2024-02-20 MED ORDER — PREDNISONE 20 MG PO TABS
40.0000 mg | ORAL_TABLET | Freq: Every day | ORAL | Status: DC
  Administered 2024-02-21 – 2024-02-25 (×5): 40 mg via ORAL
  Filled 2024-02-20 (×5): qty 2

## 2024-02-20 MED ORDER — FUROSEMIDE 10 MG/ML IJ SOLN
40.0000 mg | Freq: Once | INTRAMUSCULAR | Status: DC
  Filled 2024-02-20: qty 4

## 2024-02-20 MED ORDER — GUAIFENESIN ER 600 MG PO TB12
1200.0000 mg | ORAL_TABLET | Freq: Two times a day (BID) | ORAL | Status: DC
  Administered 2024-02-20 – 2024-02-25 (×11): 1200 mg via ORAL
  Filled 2024-02-20 (×11): qty 2

## 2024-02-20 NOTE — TOC Progression Note (Signed)
 Transition of Care [facility]) - Progression Note    Patient Details  Name: Michelle Barnett MRN: [id number] Date of Birth: 11-30-1961  Transition of Care [facility]) CM/SW Contact  Bing Quarry, RN Phone Number: 02/20/2024, 4:55 PM  Clinical Narrative:  2/23: Per provider note--Multiple acute issues ongoing.   Gabriel Cirri MSN RN CM  RN Case Manager [place]  Transitions of Care Direct Dial: [phone] (Weekends Only) [facility] Main Office Phone: [phone] [facility] Fax: [phone] [email or web address]    Expected Discharge Plan: Skilled Nursing Facility Barriers to Discharge: Continued Medical Work up  Expected Discharge Plan and Services     Post Acute Care Choice: Skilled Nursing Facility Living arrangements for the past 2 months: Single Family Home                                       Social Determinants of Health (SDOH) Interventions SDOH Screenings   Food Insecurity: No Food Insecurity (02/03/2024)  Housing: Low Risk  (02/03/2024)  Transportation Needs: No Transportation Needs (02/03/2024)  Utilities: Not At Risk (02/03/2024)  Depression (PHQ2-9): Low Risk  (09/09/2023)  Social Connections: Moderately Integrated (02/03/2024)  Tobacco Use: Low Risk  (02/03/2024)    Readmission Risk Interventions     No data to display

## 2024-02-20 NOTE — Progress Notes (Signed)
 PROGRESS NOTE    Michelle Barnett  ZOX:[id number] DOB: 12/22/1961 DOA: 02/01/2024 PCP: Doreene Nest, NP    Brief Narrative:   63 y.o. female with hypertension, hyperlipidemia, iron deficiency anemia, who presented to the ED on 02/01/2024 with acute hypoxia. Pt is currently intubated, sedated, and unable to contribute to history and no family is currently available, therefore history was obtained per chart review.  patient was previously in her PCP office where she was noted to be 66% on room air, this improved on 3 L O2 but could not get higher than 88%.  EMS was called and brought to the patient to the hospital.  In the ED she was found to be septic with a temperature of 100.7, respirations 20, tachycardic to 113 and hypoxic.  Labs were mostly unrevealing.  Patient underwent CT head, CT cervical spine, CT maxillofacial which revealed soft tissue swelling of the periorbital soft tissues of the left and along the left frontal scalp.  She received DuoNebs, azithromycin, ceftriaxone.  Hospital stay has been prolonged by ongoing hypoxia, inability to wean oxygen, and persistent delirium.  Patient has now completed her course of antibiotics.  We have initiated steroid taper, but she remains on 6 L nasal cannula.   Patient's stay was also complicated by A-fib RVR necessitating cardiology consult, amiodarone drip and heparin drip which have since been transitioned to Eliquis and amiodarone p.o.  A-fib RVR likely provoked by hypoxia and breathing treatments. Patient stay is also been complicated by lip lesions which were initially thought to be impetigo but later resulted positive for HSV.  She has had some improvement with mupirocin and was initiated on valacyclovir."   2/4: Admitted by [facility]. 2/12: Rapid response called due to witnessed Generalized Tonic Clonic seizure, self resolved.  Never lost pulse, however severely hypoxic, required emergent intubation and mechanical ventilation for airway  protection. 2/13: No significant events noted overnight.  MRI Brain last night concerning for PRESS.  Lab unable to obtain blood draws, will place central line for access.  On minimal vent support, requiring minimal Levophed.  Will perform WUQ and SBT as able. 2/14: No events noted overnight.  Plan for LP today at bedside.  On minimal vent support, will plan for WUA/SBT following LP 2/15: Pt extubated to 3L O2 via nasal canula.  Pending speech/PT/OT    2/16 patient was transferred to [facility] service.  Please review previous notes for details, and further management as below.   Assessment & Plan:   Principal Problem:   Pneumonia of right lower lobe due to infectious organism Active Problems:   AKI (acute kidney injury) (HCC)   Essential hypertension   Vitamin B12 deficiency   Adjustment disorder with mixed anxiety and depressed mood   Acute hypoxic respiratory failure (HCC)   GERD (gastroesophageal reflux disease)   Mild intermittent asthma without complication   Hyperlipidemia   Hypoxia   Ecchymosis of left eye   Sepsis (HCC)   Paroxysmal atrial fibrillation (HCC)   Atrial fibrillation with RVR (HCC)   Hiatal hernia   Pneumonia   HSV-1 infection   Acute encephalopathy # Generalized tonic clonic seizure~resolved # Acute metabolic encephalopathy~resolved  # Concern for PRESS MRI Brain consistent with PRESS EEG no PLEDS - Treat metabolic derangements  - Avoid sedating medications as able - Continue keppra 500 mg BID dosing - Meningitis/encephalitis panel 2/14: negative  - Neurology consulted: - D/c acyclovir, less likely herpes encephalitis as per ID Plan: Continue Keppra 500 twice daily Continue  Eliquis 5 g twice daily Daily PT OT speech Continue dysphagia 1 diet Seizure precautions    #Acute hypoxic respiratory failure in the setting of CAP, pulmonary edema, and asthma exacerbation - Supplemental O2 for dyspnea and/or hypoxia  - Maintain O2 sats 92% or higher  - Prn  bronchodilator therapy  -Continue p.o. prednisone, taper slowly 2/17 Mucinex 600 mg p.o. twice daily, Tussionex prn for cough 2/19 persistent hypoxia, continue supplemental admission, ABG reviewed.   2/19 CXR: Interval extubation. Persistent left retrocardiac consolidation/atelectasis. Slightly increased bibasilar opacities. 2/23: Repeat CXR due to worsening SOB.  Persistent infiltrate noted   # Atrial fibrillation with rvr~ currently in NSR # HTN  # Hx: CAD and bicuspid aortic valve  # Valvular abnormality, mitral, tricuspid and aortic valves as below. Echocardiogram 02/02/24: LVEF 55-60%. Normal diastolic parameters. RV systolic function is normal.  RV is moderately enlarged, moderate MR, moderate TR, moderate AS - Continuous telemetry monitoring  -Continue Eliquis 5 mg p.o. twice daily - Once able to tolerate po's resume amiodarone, atorvastatin, and ezetimibe  - Prn labetalol and/or hydralazine for bp management  2/16 started metoprolol 50 mg p.o. twice daily 2/20 increased losartan 50 mg p.o. daily with holding parameters Use IV hydralazine prn    # HSV-1 Infection~improving  # CAP~treated  - Trend WBC and monitor fever curve  - Follow culture results  S/p acyclovir, CSF negative so it has been discontinued. S/p Valtrex 1000 mg twice daily for 2 days, to complete 7-day course, completed     # Acute kidney Injury~resolved  # Metabolic alkalosis, suspect contraction in setting of diuresis~improving  - Trend BMP  - Strict I&O's - Avoid nephrotoxic agents as able - Replace electrolytes as indicated~pharmacy following for assistance with electrolyte replacement   # Anemia without obvious signs of bleeding  - Trend CBC  - Monitor for s/sx of bleeding  - Transfuse for hgb <7 Anemia workup within normal range 2/18 Hb 8.8, continue to monitor   # Hypophosphatemia, Phos repleted. Resolved  Monitor electrolytes and replete as needed    DVT prophylaxis: Eliquis Code Status:  Full Family Communication: Acquaintance at bedside 2/22, 2/23 Disposition Plan: Status is: Inpatient Remains inpatient appropriate because: Multiple acute issues as above   Level of care: Telemetry Medical  Consultants:  None  Procedures:  None  Antimicrobials: None    Subjective: Examined.  Reports worsening shortness of breath  Objective: Vitals:   02/19/24 2144 02/20/24 0500 02/20/24 0854 02/20/24 0922  BP: 133/62  137/61 137/61  Pulse: 73  (!) 57 68  Resp: 20     Temp: 98.9 F (37.2 C)  98.2 F (36.8 C)   TempSrc: Oral  Oral   SpO2: 96%  95%   Weight:  57.4 kg    Height:        Intake/Output Summary (Last 24 hours) at 02/20/2024 1036 Last data filed at 02/20/2024 0852 Gross per 24 hour  Intake 120 ml  Output 200 ml  Net -80 ml   Filed Weights   02/18/24 0414 02/19/24 0419 02/20/24 0500  Weight: 57.7 kg 58.5 kg 57.4 kg    Examination:  General exam: Ill-appearing Respiratory system: Scattered crackles bilaterally.  Normal work of breathing.  2 L Cardiovascular system: S1-S2, RRR, no murmurs, no pedal edema Gastrointestinal system: Thin, soft, NT/ND, normal bowel sounds Central nervous system: Alert and oriented. No focal neurological deficits. Extremities: Symmetric decreased power.  Gait not assessed Skin: No rashes, lesions or ulcers Psychiatry: Judgement and insight  appear normal. Mood & affect appropriate.     Data Reviewed: I have personally reviewed following labs and imaging studies  CBC: Recent Labs  Lab 02/15/24 0518 02/16/24 0529 02/17/24 0517 02/18/24 1009 02/19/24 0414  WBC 8.4 8.8 7.6 9.1 7.1  HGB 8.8* 9.7* 8.8* 9.5* 9.4*  HCT 28.2* 31.0* 28.4* 29.0* 30.4*  MCV 99.3 99.0 99.6 95.4 101.0*  PLT 167 164 166 212 182   Basic Metabolic Panel: Recent Labs  Lab 02/15/24 0518 02/16/24 0529 02/17/24 0517 02/18/24 1009 02/19/24 0414  NA 148* 146* 145 144 144  K 4.1 4.1 3.8 4.0 4.0  CL 106 107 105 103 101  CO2 35* 30 33* 32 36*   GLUCOSE 104* 95 76 112* 91  BUN 32* 32* 29* 28* 29*  CREATININE 0.81 0.79 0.72 0.71 0.78  CALCIUM 8.9 8.8* 8.6* 8.7* 8.6*  MG 2.1 2.1 1.9 1.8 2.0  PHOS 3.7 2.2* 3.4 3.0 3.6   GFR: Estimated Creatinine Clearance: 52.4 mL/min (by C-G formula based on SCr of 0.78 mg/dL). Liver Function Tests: No results for input(s): "AST", "ALT", "ALKPHOS", "BILITOT", "PROT", "ALBUMIN" in the last 168 hours.  No results for input(s): "LIPASE", "AMYLASE" in the last 168 hours. No results for input(s): "AMMONIA" in the last 168 hours. Coagulation Profile: No results for input(s): "INR", "PROTIME" in the last 168 hours. Cardiac Enzymes: No results for input(s): "CKTOTAL", "CKMB", "CKMBINDEX", "TROPONINI" in the last 168 hours. BNP (last 3 results) No results for input(s): "PROBNP" in the last 8760 hours. HbA1C: No results for input(s): "HGBA1C" in the last 72 hours. CBG: Recent Labs  Lab 02/15/24 0814 02/15/24 2116 02/16/24 0809 02/16/24 2311 02/17/24 0651  GLUCAP 90 135* 99 112* 75   Lipid Profile: No results for input(s): "CHOL", "HDL", "LDLCALC", "TRIG", "CHOLHDL", "LDLDIRECT" in the last 72 hours. Thyroid Function Tests: No results for input(s): "TSH", "T4TOTAL", "FREET4", "T3FREE", "THYROIDAB" in the last 72 hours. Anemia Panel: No results for input(s): "VITAMINB12", "FOLATE", "FERRITIN", "TIBC", "IRON", "RETICCTPCT" in the last 72 hours. Sepsis Labs: No results for input(s): "PROCALCITON", "LATICACIDVEN" in the last 168 hours.  Recent Results (from the past 240 hours)  CSF culture w Gram Stain     Status: None   Collection Time: 02/11/24  4:27 PM   Specimen: CSF; Cerebrospinal Fluid  Result Value Ref Range Status   Specimen Description   Final    CSF Performed at [facility], [address], [place], [place] [address]    Special Requests   Final    NONE Performed at [facility], [address] [place], [place] [address]    Gram Stain   Final    NO  ORGANISMS SEEN WBC SEEN RED BLOOD CELLS PRESENT Performed at [facility], [address], [place], [place] [address]    Culture   Final    NO GROWTH 3 DAYS Performed at [facility], [address] [place], [place] [address]    Report Status 02/14/2024 FINAL  Final  Culture, fungus without smear     Status: None (Preliminary result)   Collection Time: 02/11/24  4:27 PM   Specimen: CSF; Cerebrospinal Fluid  Result Value Ref Range Status   Specimen Description   Final    CSF Performed at [facility], [address], [place], [place] [address]    Special Requests   Final    NONE Performed at [facility], [address], [place], [place] [address]    Culture  Final    NO FUNGUS ISOLATED AFTER 8 DAYS Performed at [facility], [address], [place], [place] [address]    Report Status PENDING  Incomplete  Anaerobic culture w Gram Stain     Status: None   Collection Time: 02/11/24  4:27 PM   Specimen: CSF; Cerebrospinal Fluid  Result Value Ref Range Status   Specimen Description   Final    CSF Performed at [facility], [address] [place], [place] [address]    Special Requests   Final    NONE Performed at [facility], [address] [place], [place] [address]    Gram Stain   Final    WBC PRESENT, PREDOMINANTLY MONONUCLEAR NO ORGANISMS SEEN CYTOSPIN SMEAR    Culture   Final    NO ANAEROBES ISOLATED Performed at [facility], [address], [place], [place] [address]    Report Status 02/16/2024 FINAL  Final         Radiology Studies: No results found.      Scheduled Meds:  amiodarone  200 mg Oral BID   apixaban  5 mg Oral BID   atorvastatin  40 mg Oral QHS   Chlorhexidine Gluconate Cloth  6 each Topical Daily   cyanocobalamin  1,000 mcg Oral Daily   ezetimibe  10 mg Oral Daily   feeding supplement  237 mL Oral TID BM   furosemide  40 mg Oral Once   guaiFENesin  1,200 mg  Oral BID   levETIRAcetam  500 mg Oral BID   losartan  50 mg Oral Daily   metoprolol tartrate  50 mg Oral BID   multivitamin with minerals  1 tablet Oral Daily   mupirocin ointment   Nasal BID   mouth rinse  15 mL Mouth Rinse 4 times per day   pantoprazole  40 mg Oral Daily   polyethylene glycol  17 g Oral Daily   [START ON 02/21/2024] predniSONE  40 mg Oral Q breakfast   QUEtiapine  25 mg Oral QHS   sodium chloride flush  10-40 mL Intracatheter Q12H   thiamine  100 mg Oral Daily   Continuous Infusions:  levETIRAcetam       LOS: 19 days    Tresa Moore, MD [facility]   If 7PM-7AM, please contact night-coverage  02/20/2024, 10:36 AM

## 2024-02-20 NOTE — Plan of Care (Signed)

## 2024-02-20 NOTE — Plan of Care (Signed)
  Problem: Clinical Measurements: Goal: Respiratory complications will improve Outcome: Progressing Goal: Cardiovascular complication will be avoided Outcome: Progressing   Problem: Activity: Goal: Risk for activity intolerance will decrease Outcome: Progressing   Problem: Coping: Goal: Level of anxiety will decrease Outcome: Progressing   Problem: Elimination: Goal: Will not experience complications related to bowel motility Outcome: Progressing Goal: Will not experience complications related to urinary retention Outcome: Progressing   Problem: Safety: Goal: Ability to remain free from injury will improve Outcome: Progressing   Problem: Skin Integrity: Goal: Risk for impaired skin integrity will decrease Outcome: Progressing   Problem: Clinical Measurements: Goal: Ability to maintain a body temperature in the normal range will improve Outcome: Progressing   Problem: Respiratory: Goal: Ability to maintain adequate ventilation will improve Outcome: Progressing   Problem: Respiratory: Goal: Ability to maintain a clear airway and adequate ventilation will improve Outcome: Progressing

## 2024-02-20 NOTE — Progress Notes (Signed)
 A consult was placed to the hospital's IV nurse for new IV access;  pt is very well known to multiple IV nurses; since admission on 02/01/24, the pt has pulled out approx 14 different IVs, despite wearing mittens; pt with very poor, limited access; have sent secure chats to Dr Georgeann Oppenheim, who will change her Keppra and Lasix to PO; an order will be placed in the Nursing Communication section of the chart,  indicating the pt can be without IV access, unless her condition changes and she becomes more critical; Nursing staff will enter a consult for STAT IV access at that time.

## 2024-02-20 NOTE — Plan of Care (Signed)
  Problem: Education: Goal: Knowledge of General Education information will improve Description: Including pain rating scale, medication(s)/side effects and non-pharmacologic comfort measures Outcome: Progressing   Problem: Health Behavior/Discharge Planning: Goal: Ability to manage health-related needs will improve Outcome: Progressing   Problem: Clinical Measurements: Goal: Ability to maintain clinical measurements within normal limits will improve Outcome: Progressing Goal: Will remain free from infection Outcome: Progressing Goal: Diagnostic test results will improve Outcome: Progressing Goal: Respiratory complications will improve Outcome: Progressing Goal: Cardiovascular complication will be avoided Outcome: Progressing   Problem: Activity: Goal: Risk for activity intolerance will decrease Outcome: Progressing   Problem: Nutrition: Goal: Adequate nutrition will be maintained Outcome: Progressing   Problem: Coping: Goal: Level of anxiety will decrease Outcome: Progressing   Problem: Elimination: Goal: Will not experience complications related to bowel motility Outcome: Progressing Goal: Will not experience complications related to urinary retention Outcome: Progressing   Problem: Pain Managment: Goal: General experience of comfort will improve and/or be controlled Outcome: Progressing   Problem: Safety: Goal: Ability to remain free from injury will improve Outcome: Progressing   Problem: Skin Integrity: Goal: Risk for impaired skin integrity will decrease Outcome: Progressing   Problem: Activity: Goal: Ability to tolerate increased activity will improve Outcome: Progressing   Problem: Clinical Measurements: Goal: Ability to maintain a body temperature in the normal range will improve Outcome: Progressing   Problem: Respiratory: Goal: Ability to maintain adequate ventilation will improve Outcome: Progressing Goal: Ability to maintain a clear airway  will improve Outcome: Progressing   Problem: Activity: Goal: Ability to tolerate increased activity will improve Outcome: Progressing   Problem: Respiratory: Goal: Ability to maintain a clear airway and adequate ventilation will improve Outcome: Progressing   Problem: Role Relationship: Goal: Method of communication will improve Outcome: Progressing

## 2024-02-21 DIAGNOSIS — J189 Pneumonia, unspecified organism: Secondary | ICD-10-CM | POA: Diagnosis not present

## 2024-02-21 MED ORDER — AMIODARONE HCL 200 MG PO TABS
200.0000 mg | ORAL_TABLET | Freq: Every day | ORAL | Status: DC
  Administered 2024-02-21 – 2024-02-25 (×5): 200 mg via ORAL
  Filled 2024-02-21 (×5): qty 1

## 2024-02-21 MED ORDER — FUROSEMIDE 40 MG PO TABS
40.0000 mg | ORAL_TABLET | Freq: Once | ORAL | Status: AC
  Administered 2024-02-21: 40 mg via ORAL
  Filled 2024-02-21: qty 1

## 2024-02-21 NOTE — Progress Notes (Signed)
 Occupational Therapy Treatment Patient Details Name: Michelle Barnett MRN: [id number] DOB: 07-Oct-1961 Today's Date: 02/21/2024   History of present illness Pt is a 63 y.o. female admitted following a fall at home with acute metabolic encephalopathy, acute hypoxic respiratory failure in the setting of CAP and acute asthma exacerbation, A.fib with RVR, AKI, anemia without obvious signs of bleeding, and HSV 1 infection. Course complicated on 02/09/24 by generalized tonic clonic seizure with severe hypoxia requiring intubation and mechanical ventilation for airway protection. Pt extubated 02/12/24.   OT comments  Chart reviewed, pt seen on this date with mobility specialist in order to optimize functional mobility in preparation for improved ADL performance. Improvements noted throughout with pt performing bed mobility with MIN A, STS with MIN A, amb with RW approx 32' with RW with MOD A, +2 for safety/equipment. Pt excessive postural sway throughout amb trial.  Supervision for grooming tasks. Step by step multi modal cues required for simple directives. Pt is making progress towards goals, discharge recommendation remains appropriate. OT will continue to follow.       If plan is discharge home, recommend the following:  Two people to help with walking and/or transfers;Two people to help with bathing/dressing/bathroom;Assist for transportation;Help with stairs or ramp for entrance;Direct supervision/assist for financial management;Supervision due to cognitive status;Direct supervision/assist for medications management;Assistance with cooking/housework   Equipment Recommendations  Other (comment) (defer)    Recommendations for Other Services      Precautions / Restrictions Precautions Precautions: Fall Recall of Precautions/Restrictions: Impaired Restrictions Weight Bearing Restrictions Per Provider Order: No       Mobility Bed Mobility Overal bed mobility: Needs Assistance Bed Mobility: Sit  to Supine     Supine to sit: Min assist     General bed mobility comments: pt sitting on edge of bed with mobility specialist pre OT entering    Transfers Overall transfer level: Needs assistance Equipment used: Rolling walker (2 wheels) Transfers: Sit to/from Stand Sit to Stand: Min assist                 Balance Overall balance assessment: Needs assistance Sitting-balance support: Bilateral upper extremity supported, Feet supported Sitting balance-Leahy Scale: Fair     Standing balance support: Bilateral upper extremity supported, During functional activity, Reliant on assistive device for balance Standing balance-Leahy Scale: Poor                             ADL either performed or assessed with clinical judgement   ADL Overall ADL's : Needs assistance/impaired      Grooming- supervision        Lower Body Bathing: Maximal assistance           Toilet Transfer: Moderate assistance;Ambulation;Rolling walker (2 wheels) Toilet Transfer Details (indicate cue type and reason): +2 for safety/oxygen management         Functional mobility during ADLs: Moderate assistance;Rolling walker (2 wheels);+2 for safety/equipment (approx 40' with RW, step by step multi modal cues for technique/safety)      Extremity/Trunk Assessment              Vision       Perception     Praxis     Communication Communication Communication: No apparent difficulties   Cognition Arousal: Alert Behavior During Therapy: Flat affect, WFL for tasks assessed/performed Cognition: No family/caregiver present to determine baseline, Cognition impaired   Orientation impairments: Time, Situation Awareness: Intellectual awareness impaired, Online awareness  impaired   Attention impairment (select first level of impairment): Sustained attention                     Following commands: Impaired Following commands impaired: Follows one step commands with increased  time (and frequent multi modal cues)      Cueing   Cueing Techniques: Verbal cues, Tactile cues, Gestural cues, Visual cues  Exercises Other Exercises Other Exercises: edu re: role of OT, role of rehab, importance of continued progressing mobility    Shoulder Instructions       General Comments sitting on edge of bed BP 145/78, after amb approx 75' BP 122/62; spo2 >90% on 3L when monitored    Pertinent Vitals/ Pain       Pain Assessment Pain Assessment: No/denies pain  Home Living                                          Prior Functioning/Environment              Frequency  Min 1X/week        Progress Toward Goals  OT Goals(current goals can now be found in the care plan section)  Progress towards OT goals: Progressing toward goals  Acute Rehab OT Goals Time For Goal Achievement: 02/28/24  Plan      Co-evaluation                 AM-PAC OT "6 Clicks" Daily Activity     Outcome Measure   Help from another person eating meals?: A Little Help from another person taking care of personal grooming?: A Lot Help from another person toileting, which includes using toliet, bedpan, or urinal?: A Lot Help from another person bathing (including washing, rinsing, drying)?: A Lot Help from another person to put on and taking off regular upper body clothing?: A Lot Help from another person to put on and taking off regular lower body clothing?: A Lot 6 Click Score: 13    End of Session Equipment Utilized During Treatment: Oxygen;Rolling walker (2 wheels)  OT Visit Diagnosis: Other abnormalities of gait and mobility (R26.89);Muscle weakness (generalized) (M62.81)   Activity Tolerance Patient tolerated treatment well   Patient Left in bed;with call bell/phone within reach;with bed alarm set   Nurse Communication Mobility status;Other (comment) (vitals)        Time: 4098-1191 OT Time Calculation (min): 20 min  Charges: OT General  Charges $OT Visit: 1 Visit OT Treatments $Therapeutic Activity: 8-22 mins  Oleta Mouse, OTD OTR/L  02/21/24, 12:38 PM

## 2024-02-21 NOTE — Progress Notes (Signed)
 PROGRESS NOTE    Michelle Barnett  ZOX:[id number] DOB: 13-Jan-1961 DOA: 02/01/2024 PCP: Doreene Nest, NP    Brief Narrative:   63 y.o. female with hypertension, hyperlipidemia, iron deficiency anemia, who presented to the ED on 02/01/2024 with acute hypoxia. Pt is currently intubated, sedated, and unable to contribute to history and no family is currently available, therefore history was obtained per chart review.  patient was previously in her PCP office where she was noted to be 66% on room air, this improved on 3 L O2 but could not get higher than 88%.  EMS was called and brought to the patient to the hospital.  In the ED she was found to be septic with a temperature of 100.7, respirations 20, tachycardic to 113 and hypoxic.  Labs were mostly unrevealing.  Patient underwent CT head, CT cervical spine, CT maxillofacial which revealed soft tissue swelling of the periorbital soft tissues of the left and along the left frontal scalp.  She received DuoNebs, azithromycin, ceftriaxone.  Hospital stay has been prolonged by ongoing hypoxia, inability to wean oxygen, and persistent delirium.  Patient has now completed her course of antibiotics.  We have initiated steroid taper, but she remains on 6 L nasal cannula.   Patient's stay was also complicated by A-fib RVR necessitating cardiology consult, amiodarone drip and heparin drip which have since been transitioned to Eliquis and amiodarone p.o.  A-fib RVR likely provoked by hypoxia and breathing treatments. Patient stay is also been complicated by lip lesions which were initially thought to be impetigo but later resulted positive for HSV.  She has had some improvement with mupirocin and was initiated on valacyclovir."   2/4: Admitted by [facility]. 2/12: Rapid response called due to witnessed Generalized Tonic Clonic seizure, self resolved.  Never lost pulse, however severely hypoxic, required emergent intubation and mechanical ventilation for airway  protection. 2/13: No significant events noted overnight.  MRI Brain last night concerning for PRESS.  Lab unable to obtain blood draws, will place central line for access.  On minimal vent support, requiring minimal Levophed.  Will perform WUQ and SBT as able. 2/14: No events noted overnight.  Plan for LP today at bedside.  On minimal vent support, will plan for WUA/SBT following LP 2/15: Pt extubated to 3L O2 via nasal canula.  Pending speech/PT/OT    2/16 patient was transferred to [facility] service.  Please review previous notes for details, and further management as below.   Assessment & Plan:   Principal Problem:   Pneumonia of right lower lobe due to infectious organism Active Problems:   AKI (acute kidney injury) (HCC)   Essential hypertension   Vitamin B12 deficiency   Adjustment disorder with mixed anxiety and depressed mood   Acute hypoxic respiratory failure (HCC)   GERD (gastroesophageal reflux disease)   Mild intermittent asthma without complication   Hyperlipidemia   Hypoxia   Ecchymosis of left eye   Sepsis (HCC)   Paroxysmal atrial fibrillation (HCC)   Atrial fibrillation with RVR (HCC)   Hiatal hernia   Pneumonia   HSV-1 infection   Acute encephalopathy # Generalized tonic clonic seizure~resolved # Acute metabolic encephalopathy~resolved  # Concern for PRESS MRI Brain consistent with PRESS EEG no PLEDS - Treat metabolic derangements  - Avoid sedating medications as able - Continue keppra 500 mg BID dosing - Meningitis/encephalitis panel 2/14: negative  - Neurology consulted: - D/c acyclovir, less likely herpes encephalitis as per ID Plan: Continue Keppra 500 twice daily Continue  Eliquis 5 g twice daily Daily PT OT speech Continue dysphagia 1 diet Seizure precautions    #Acute hypoxic respiratory failure in the setting of CAP, pulmonary edema, and asthma exacerbation - Supplemental O2 for dyspnea and/or hypoxia  - Maintain O2 sats 92% or higher  - Prn  bronchodilator therapy  -Continue p.o. prednisone, taper slowly 2/17 Mucinex 600 mg p.o. twice daily, Tussionex prn for cough 2/19 persistent hypoxia, continue supplemental admission, ABG reviewed.   2/19 CXR: Interval extubation. Persistent left retrocardiac consolidation/atelectasis. Slightly increased bibasilar opacities. 2/23: Repeat CXR due to worsening SOB.  Persistent infiltrate noted   # Atrial fibrillation with rvr~ currently in NSR # HTN  # Hx: CAD and bicuspid aortic valve  # Valvular abnormality, mitral, tricuspid and aortic valves as below. Plan: Metop 50 BID Losartan 50 every day Amiodarone 200 daily x 1 month, then stop  # HSV-1 Infection~improved # CAP~treated  - Trend WBC and monitor fever curve  - Follow culture results  S/p acyclovir, CSF negative so it has been discontinued. S/p Valtrex 1000 mg twice daily for 2 days, to complete 7-day course, completed     # Acute kidney Injury~resolved  # Metabolic alkalosis, suspect contraction in setting of diuresis~improving  - Trend BMP  - Strict I&O's - Avoid nephrotoxic agents as able - Replace electrolytes as indicated~pharmacy following for assistance with electrolyte replacement   # Anemia without obvious signs of bleeding  - Trend CBC  - Monitor for s/sx of bleeding  - Transfuse for hgb <7 Anemia workup within normal range 2/18 Hb 8.8, continue to monitor   # Hypophosphatemia, Phos repleted. Resolved  Monitor electrolytes and replete as needed    DVT prophylaxis: Eliquis Code Status: Full Family Communication: S.O at bedside 2/22, 2/23 Disposition Plan: Status is: Inpatient Remains inpatient appropriate because: Multiple acute issues as above   Level of care: Telemetry Medical  Consultants:  None  Procedures:  None  Antimicrobials: None    Subjective: Seen and examined.  Shortness of breath improved.  Objective: Vitals:   02/20/24 0854 02/20/24 0922 02/20/24 1715 02/21/24 0837  BP:  137/61 137/61 (!) 156/66 (!) 141/57  Pulse: (!) 57 68 (!) 58 61  Resp:   16 16  Temp: 98.2 F (36.8 C)  98.4 F (36.9 C) 97.8 F (36.6 C)  TempSrc: Oral  Oral Oral  SpO2: 95%  99% 95%  Weight:      Height:        Intake/Output Summary (Last 24 hours) at 02/21/2024 1058 Last data filed at 02/21/2024 0900 Gross per 24 hour  Intake 480 ml  Output 900 ml  Net -420 ml   Filed Weights   02/18/24 0414 02/19/24 0419 02/20/24 0500  Weight: 57.7 kg 58.5 kg 57.4 kg    Examination:  General exam: Appears fatigued and chronically ill Respiratory system: Scattered bilateral crackles.  Normal work of breathing.  2 L Cardiovascular system: S1-S2, RRR, no murmurs, no pedal edema Gastrointestinal system: Thin, soft, NT/ND, normal bowel sounds Central nervous system: Alert and oriented. No focal neurological deficits. Extremities: Symmetric decreased power.  Gait not assessed Skin: No rashes, lesions or ulcers Psychiatry: Judgement and insight appear normal. Mood & affect appropriate.     Data Reviewed: I have personally reviewed following labs and imaging studies  CBC: Recent Labs  Lab 02/15/24 0518 02/16/24 0529 02/17/24 0517 02/18/24 1009 02/19/24 0414  WBC 8.4 8.8 7.6 9.1 7.1  HGB 8.8* 9.7* 8.8* 9.5* 9.4*  HCT 28.2* 31.0*  28.4* 29.0* 30.4*  MCV 99.3 99.0 99.6 95.4 101.0*  PLT 167 164 166 212 182   Basic Metabolic Panel: Recent Labs  Lab 02/15/24 0518 02/16/24 0529 02/17/24 0517 02/18/24 1009 02/19/24 0414  NA 148* 146* 145 144 144  K 4.1 4.1 3.8 4.0 4.0  CL 106 107 105 103 101  CO2 35* 30 33* 32 36*  GLUCOSE 104* 95 76 112* 91  BUN 32* 32* 29* 28* 29*  CREATININE 0.81 0.79 0.72 0.71 0.78  CALCIUM 8.9 8.8* 8.6* 8.7* 8.6*  MG 2.1 2.1 1.9 1.8 2.0  PHOS 3.7 2.2* 3.4 3.0 3.6   GFR: Estimated Creatinine Clearance: 52.4 mL/min (by C-G formula based on SCr of 0.78 mg/dL). Liver Function Tests: No results for input(s): "AST", "ALT", "ALKPHOS", "BILITOT", "PROT",  "ALBUMIN" in the last 168 hours.  No results for input(s): "LIPASE", "AMYLASE" in the last 168 hours. No results for input(s): "AMMONIA" in the last 168 hours. Coagulation Profile: No results for input(s): "INR", "PROTIME" in the last 168 hours. Cardiac Enzymes: No results for input(s): "CKTOTAL", "CKMB", "CKMBINDEX", "TROPONINI" in the last 168 hours. BNP (last 3 results) No results for input(s): "PROBNP" in the last 8760 hours. HbA1C: No results for input(s): "HGBA1C" in the last 72 hours. CBG: Recent Labs  Lab 02/15/24 0814 02/15/24 2116 02/16/24 0809 02/16/24 2311 02/17/24 0651  GLUCAP 90 135* 99 112* 75   Lipid Profile: No results for input(s): "CHOL", "HDL", "LDLCALC", "TRIG", "CHOLHDL", "LDLDIRECT" in the last 72 hours. Thyroid Function Tests: No results for input(s): "TSH", "T4TOTAL", "FREET4", "T3FREE", "THYROIDAB" in the last 72 hours. Anemia Panel: No results for input(s): "VITAMINB12", "FOLATE", "FERRITIN", "TIBC", "IRON", "RETICCTPCT" in the last 72 hours. Sepsis Labs: No results for input(s): "PROCALCITON", "LATICACIDVEN" in the last 168 hours.  Recent Results (from the past 240 hours)  CSF culture w Gram Stain     Status: None   Collection Time: 02/11/24  4:27 PM   Specimen: CSF; Cerebrospinal Fluid  Result Value Ref Range Status   Specimen Description   Final    CSF Performed at [facility], [address] [place], [place] [address]    Special Requests   Final    NONE Performed at [facility], [address] [place], [place] [address]    Gram Stain   Final    NO ORGANISMS SEEN WBC SEEN RED BLOOD CELLS PRESENT Performed at [facility], [address] [place], [place] [address]    Culture   Final    NO GROWTH 3 DAYS Performed at [facility], [address] [place], [place] [address]    Report Status 02/14/2024 FINAL  Final  Culture, fungus without smear     Status: None (Preliminary result)   Collection  Time: 02/11/24  4:27 PM   Specimen: CSF; Cerebrospinal Fluid  Result Value Ref Range Status   Specimen Description   Final    CSF Performed at [facility], [address] [place], [place] [address]    Special Requests   Final    NONE Performed at [facility], [address], [place], [place] [address]    Culture   Final    NO FUNGUS ISOLATED AFTER 9 DAYS Performed at [facility], [address] [place], [place] [address]    Report Status PENDING  Incomplete  Anaerobic culture w Gram Stain     Status: None   Collection Time: 02/11/24  4:27 PM  Specimen: CSF; Cerebrospinal Fluid  Result Value Ref Range Status   Specimen Description   Final    CSF Performed at [facility], [address] [place], [place] [address]    Special Requests   Final    NONE Performed at [facility], [address] [place], [place] [address]    Gram Stain   Final    WBC PRESENT, PREDOMINANTLY MONONUCLEAR NO ORGANISMS SEEN CYTOSPIN SMEAR    Culture   Final    NO ANAEROBES ISOLATED Performed at [facility], [address], [place], [place] [address]    Report Status 02/16/2024 FINAL  Final         Radiology Studies: DG Chest Port 1 View Result Date: 02/20/2024 CLINICAL DATA:  Hypoxia EXAM: PORTABLE CHEST 1 VIEW COMPARISON:  02/16/2024, CT 02/03/2019 FINDINGS: Stable large cardiac silhouette. Elevation of LEFT hemidiaphragm. Dense LEFT lower lobe airspace disease persists. LEFT effusion persists. Small RIGHT effusion. Mild RIGHT airspace disease. IMPRESSION: 1. Minimal change from 02/16/2024. 2. Bibasilar pneumonia and effusions. Electronically Signed   By: Genevive Bi M.D.   On: 02/20/2024 13:14        Scheduled Meds:  amiodarone  200 mg Oral Daily   apixaban  5 mg Oral BID   atorvastatin  40 mg Oral QHS   Chlorhexidine Gluconate Cloth  6 each Topical Daily   cyanocobalamin  1,000 mcg Oral Daily   ezetimibe  10 mg Oral  Daily   feeding supplement  237 mL Oral TID BM   guaiFENesin  1,200 mg Oral BID   levETIRAcetam  500 mg Oral BID   losartan  50 mg Oral Daily   metoprolol tartrate  50 mg Oral BID   multivitamin with minerals  1 tablet Oral Daily   mupirocin ointment   Nasal BID   mouth rinse  15 mL Mouth Rinse 4 times per day   pantoprazole  40 mg Oral Daily   polyethylene glycol  17 g Oral Daily   predniSONE  40 mg Oral Q breakfast   QUEtiapine  25 mg Oral QHS   sodium chloride flush  10-40 mL Intracatheter Q12H   thiamine  100 mg Oral Daily   Continuous Infusions:     LOS: 20 days    Tresa Moore, MD [facility]   If 7PM-7AM, please contact night-coverage  02/21/2024, 10:58 AM

## 2024-02-21 NOTE — TOC Progression Note (Signed)
 Transition of Care [facility]) - Progression Note    Patient Details  Name: Michelle Barnett MRN: [id number] Date of Birth: [birth year]  [facility]) CM/SW Contact  Marlowe Sax, RN Phone Number: 02/21/2024, 12:17 PM  Clinical Narrative:     Met with the patient about going to str, she is agreeable, need her physical address to get passr, I called Sherrine Maples with her permission to get the physical address, did not reach, VM if full  Expected Discharge Plan: Skilled Nursing Facility Barriers to Discharge: Continued Medical Work up  Expected Discharge Plan and Services     Post Acute Care Choice: Skilled Nursing Facility Living arrangements for the past 2 months: Single Family Home                                       Social Determinants of Health (SDOH) Interventions SDOH Screenings   Food Insecurity: No Food Insecurity (02/03/2024)  Housing: Low Risk  (02/03/2024)  Transportation Needs: No Transportation Needs (02/03/2024)  Utilities: Not At Risk (02/03/2024)  Depression (PHQ2-9): Low Risk  (09/09/2023)  Social Connections: Moderately Integrated (02/03/2024)  Tobacco Use: Low Risk  (02/03/2024)    Readmission Risk Interventions     No data to display

## 2024-02-21 NOTE — TOC Progression Note (Signed)
 Transition of Care [facility]) - Progression Note    Patient Details  Name: Michelle Barnett MRN: [id number] Date of Birth: 04/06/1961  Transition of Care [facility]) CM/SW Contact  Marlowe Sax, RN Phone Number: 02/21/2024, 4:29 PM  Clinical Narrative:     Rod Can and attmepted to leave a VM VM is full, Patient needs a physical address to get PASSR  Expected Discharge Plan: Skilled Nursing Facility Barriers to Discharge: Continued Medical Work up  Expected Discharge Plan and Services     Post Acute Care Choice: Skilled Nursing Facility Living arrangements for the past 2 months: Single Family Home                                       Social Determinants of Health (SDOH) Interventions SDOH Screenings   Food Insecurity: No Food Insecurity (02/03/2024)  Housing: Low Risk  (02/03/2024)  Transportation Needs: No Transportation Needs (02/03/2024)  Utilities: Not At Risk (02/03/2024)  Depression (PHQ2-9): Low Risk  (09/09/2023)  Social Connections: Moderately Integrated (02/03/2024)  Tobacco Use: Low Risk  (02/03/2024)    Readmission Risk Interventions     No data to display

## 2024-02-21 NOTE — Plan of Care (Signed)
  Problem: Education: Goal: Knowledge of General Education information will improve Description: Including pain rating scale, medication(s)/side effects and non-pharmacologic comfort measures Outcome: Progressing   Problem: Health Behavior/Discharge Planning: Goal: Ability to manage health-related needs will improve Outcome: Progressing   Problem: Clinical Measurements: Goal: Ability to maintain clinical measurements within normal limits will improve Outcome: Progressing Goal: Will remain free from infection Outcome: Progressing Goal: Diagnostic test results will improve Outcome: Progressing Goal: Respiratory complications will improve Outcome: Progressing Goal: Cardiovascular complication will be avoided Outcome: Progressing   Problem: Activity: Goal: Risk for activity intolerance will decrease Outcome: Progressing   Problem: Nutrition: Goal: Adequate nutrition will be maintained Outcome: Progressing   Problem: Coping: Goal: Level of anxiety will decrease Outcome: Progressing   Problem: Elimination: Goal: Will not experience complications related to bowel motility Outcome: Progressing Goal: Will not experience complications related to urinary retention Outcome: Progressing   Problem: Pain Managment: Goal: General experience of comfort will improve and/or be controlled Outcome: Progressing   Problem: Safety: Goal: Ability to remain free from injury will improve Outcome: Progressing   Problem: Skin Integrity: Goal: Risk for impaired skin integrity will decrease Outcome: Progressing   Problem: Activity: Goal: Ability to tolerate increased activity will improve Outcome: Progressing   Problem: Clinical Measurements: Goal: Ability to maintain a body temperature in the normal range will improve Outcome: Progressing   Problem: Respiratory: Goal: Ability to maintain adequate ventilation will improve Outcome: Progressing Goal: Ability to maintain a clear airway  will improve Outcome: Progressing   Problem: Activity: Goal: Ability to tolerate increased activity will improve Outcome: Progressing   Problem: Respiratory: Goal: Ability to maintain a clear airway and adequate ventilation will improve Outcome: Progressing   Problem: Role Relationship: Goal: Method of communication will improve Outcome: Progressing

## 2024-02-21 NOTE — Progress Notes (Signed)
 Physical Therapy Treatment Patient Details Name: Michelle Barnett MRN: [id number] DOB: Jun 27, 1961 Today's Date: 02/21/2024   History of Present Illness Pt is a 63 y.o. female admitted following a fall at home with acute metabolic encephalopathy, acute hypoxic respiratory failure in the setting of CAP and acute asthma exacerbation, A.fib with RVR, AKI, anemia without obvious signs of bleeding, and HSV 1 infection. Course complicated on 02/09/24 by generalized tonic clonic seizure with severe hypoxia requiring intubation and mechanical ventilation for airway protection. Pt extubated 02/12/24.    PT Comments  Patient alert, agreeable to therapy with some encouragement, had done some walking earlier today with OT/mobility specialist. Pt/PT agreed on some seated exercises. Pt able to perform supine <> sit with minA, extra time to maximize participation. Pt noted for wet linen, sit <> stand with RW and minA for steadying. MaxA for pericare in standing/supine and purewick switched out due to being soiled. Pt with all needs in reach at end of session. The patient would benefit from further skilled PT intervention to continue to progress towards goals.    If plan is discharge home, recommend the following: A lot of help with walking and/or transfers;A lot of help with bathing/dressing/bathroom;Assistance with cooking/housework;Direct supervision/assist for medications management;Direct supervision/assist for financial management;Assist for transportation;Help with stairs or ramp for entrance;Supervision due to cognitive status   Can travel by private vehicle     No  Equipment Recommendations  Other (comment) (defer to next level of care)    Recommendations for Other Services       Precautions / Restrictions Precautions Precautions: Fall Recall of Precautions/Restrictions: Impaired     Mobility  Bed Mobility Overal bed mobility: Needs Assistance Bed Mobility: Sit to Supine     Supine to sit: Min  assist     General bed mobility comments: minA to weight shift to succesfully sit EOB    Transfers Overall transfer level: Needs assistance Equipment used: Rolling walker (2 wheels) Transfers: Sit to/from Stand Sit to Stand: Min assist           General transfer comment: cues for hand placement, minA for steadying but pt comes up into standing well    Ambulation/Gait               General Gait Details: deferred due to pt fatigue and ambulation earlier in day   Stairs             Wheelchair Mobility     Tilt Bed    Modified Rankin (Stroke Patients Only)       Balance Overall balance assessment: Needs assistance Sitting-balance support: Bilateral upper extremity supported, Feet supported Sitting balance-Leahy Scale: Fair     Standing balance support: Bilateral upper extremity supported, During functional activity, Reliant on assistive device for balance Standing balance-Leahy Scale: Poor                              Communication Communication Communication: No apparent difficulties  Cognition Arousal: Alert Behavior During Therapy: Flat affect, WFL for tasks assessed/performed   PT - Cognitive impairments: Awareness, Difficult to assess, Safety/Judgement                       PT - Cognition Comments: pt was able to follow simple commands, questionable situational awareness due to pt choice of conversation topics        Cueing    Exercises  General Comments General comments (skin integrity, edema, etc.): sitting on edge of bed BP 145/78, after amb approx 75' BP 122/62; spo2 >90% on 3L when monitored      Pertinent Vitals/Pain Pain Assessment Pain Assessment: No/denies pain    Home Living                          Prior Function            PT Goals (current goals can now be found in the care plan section) Progress towards PT goals: Progressing toward goals    Frequency    Min  1X/week      PT Plan      Co-evaluation              AM-PAC PT "6 Clicks" Mobility   Outcome Measure  Help needed turning from your back to your side while in a flat bed without using bedrails?: A Lot Help needed moving from lying on your back to sitting on the side of a flat bed without using bedrails?: A Lot Help needed moving to and from a bed to a chair (including a wheelchair)?: A Lot Help needed standing up from a chair using your arms (e.g., wheelchair or bedside chair)?: A Lot Help needed to walk in hospital room?: A Lot Help needed climbing 3-5 steps with a railing? : Total 6 Click Score: 11    End of Session Equipment Utilized During Treatment: Oxygen Activity Tolerance: Patient tolerated treatment well Patient left: in bed;with call bell/phone within reach;with bed alarm set Nurse Communication: Mobility status PT Visit Diagnosis: Muscle weakness (generalized) (M62.81);Difficulty in walking, not elsewhere classified (R26.2);Unsteadiness on feet (R26.81)     Time: 1610-9604 PT Time Calculation (min) (ACUTE ONLY): 15 min  Charges:    $Therapeutic Activity: 8-22 mins PT General Charges $$ ACUTE PT VISIT: 1 Visit                     Olga Coaster PT, DPT 2:40 PM,02/21/24

## 2024-02-22 DIAGNOSIS — J189 Pneumonia, unspecified organism: Secondary | ICD-10-CM | POA: Diagnosis not present

## 2024-02-22 LAB — CBC WITH DIFFERENTIAL/PLATELET
Abs Immature Granulocytes: 0.1 10*3/uL — ABNORMAL HIGH (ref 0.00–0.07)
Basophils Absolute: 0 10*3/uL (ref 0.0–0.1)
Basophils Relative: 0 %
Eosinophils Absolute: 0 10*3/uL (ref 0.0–0.5)
Eosinophils Relative: 0 %
HCT: 33.4 % — ABNORMAL LOW (ref 36.0–46.0)
Hemoglobin: 10.4 g/dL — ABNORMAL LOW (ref 12.0–15.0)
Immature Granulocytes: 1 %
Lymphocytes Relative: 8 %
Lymphs Abs: 0.8 10*3/uL (ref 0.7–4.0)
MCH: 31.1 pg (ref 26.0–34.0)
MCHC: 31.1 g/dL (ref 30.0–36.0)
MCV: 100 fL (ref 80.0–100.0)
Monocytes Absolute: 0.9 10*3/uL (ref 0.1–1.0)
Monocytes Relative: 8 %
Neutro Abs: 8.6 10*3/uL — ABNORMAL HIGH (ref 1.7–7.7)
Neutrophils Relative %: 83 %
Platelets: 197 10*3/uL (ref 150–400)
RBC: 3.34 MIL/uL — ABNORMAL LOW (ref 3.87–5.11)
RDW: 16.6 % — ABNORMAL HIGH (ref 11.5–15.5)
WBC: 10.4 10*3/uL (ref 4.0–10.5)
nRBC: 0.2 % (ref 0.0–0.2)

## 2024-02-22 LAB — BASIC METABOLIC PANEL
Anion gap: 10 (ref 5–15)
BUN: 39 mg/dL — ABNORMAL HIGH (ref 8–23)
CO2: 39 mmol/L — ABNORMAL HIGH (ref 22–32)
Calcium: 8.9 mg/dL (ref 8.9–10.3)
Chloride: 93 mmol/L — ABNORMAL LOW (ref 98–111)
Creatinine, Ser: 0.86 mg/dL (ref 0.44–1.00)
GFR, Estimated: >60 mL/min (ref >60)
Glucose, Bld: 107 mg/dL — ABNORMAL HIGH (ref 70–99)
Potassium: 3.3 mmol/L — ABNORMAL LOW (ref 3.5–5.1)
Sodium: 142 mmol/L (ref 135–145)

## 2024-02-22 MED ORDER — FUROSEMIDE 40 MG PO TABS
40.0000 mg | ORAL_TABLET | Freq: Once | ORAL | Status: AC
  Administered 2024-02-22: 40 mg via ORAL
  Filled 2024-02-22: qty 1

## 2024-02-22 NOTE — Progress Notes (Signed)
 Mobility Specialist - Progress Note   02/22/24 1135  Mobility  Activity Ambulated with assistance in hallway  Level of Assistance Minimal assist, patient does 75% or more  Assistive Device Front wheel walker  Distance Ambulated (ft) 95 ft  Activity Response Tolerated well  Mobility visit 1 Mobility     Pt sitting in recliner upon arrival, utilizing 2L. Pt agreeable to activity. AO to self, location, and year. Pt emotional and teary-eyed this date when realizing her birthday is coming up. STS with minA. Noted BM upon standing. Assist for peri-care d/t pt need for BUE support. Post lean in standing requiring cues to correct. Pt ambulated in hallway with minA + chair follow. Requires RW negotiation to prevent running into obstacles and walls. Pt fatigued after 95' feet requesting seated rest break. Pt returned to room with chair alarm set, needs in reach.    Filiberto Pinks Mobility Specialist 02/22/24, 11:44 AM

## 2024-02-22 NOTE — TOC Progression Note (Addendum)
 Transition of Care [facility]) - Progression Note    Patient Details  Name: Michelle Barnett MRN: [id number] Date of Birth: [birth year]  Transition of Care [facility]) CM/SW Contact  Marlowe Sax, RN Phone Number: 02/22/2024, 2:27 PM  Clinical Narrative:    Attempted to reach [place] to get the physical address, unable to reach, he does not have a VM to leave a message, this is a landline number and can not send a text asking for a call back Bedsearch sent, will need  physical address to get PASSR, expanded the bed search thru the entire Hub   Expected Discharge Plan: Skilled Nursing Facility Barriers to Discharge: Continued Medical Work up  Expected Discharge Plan and Services     Post Acute Care Choice: Skilled Nursing Facility Living arrangements for the past 2 months: Single Family Home                                       Social Determinants of Health (SDOH) Interventions SDOH Screenings   Food Insecurity: No Food Insecurity (02/03/2024)  Housing: Low Risk  (02/03/2024)  Transportation Needs: No Transportation Needs (02/03/2024)  Utilities: Not At Risk (02/03/2024)  Depression (PHQ2-9): Low Risk  (09/09/2023)  Social Connections: Moderately Integrated (02/03/2024)  Tobacco Use: Low Risk  (02/03/2024)    Readmission Risk Interventions     No data to display

## 2024-02-22 NOTE — Progress Notes (Signed)
 Mobility Specialist - Progress Note   02/22/24 1445  Mobility  Activity Transferred from chair to bed  Level of Assistance Minimal assist, patient does 75% or more  Assistive Device Front wheel walker  Distance Ambulated (ft) 4 ft  Activity Response Tolerated well  Mobility visit 1 Mobility  Mobility Specialist Start Time (ACUTE ONLY) 1412  Mobility Specialist Stop Time (ACUTE ONLY) 1423  Mobility Specialist Time Calculation (min) (ACUTE ONLY) 11 min   Pt sitting in the recliner upon entry, [place]on doffed-- unsure of duration spent without O2. MS places Martorell, Pt STS to RW MinG, transferred to bed via SPT MinA-- min manipulation of the RW when turing. Pt left simi fowler with alarm set and needs within reach.  Zetta Bills Mobility Specialist 02/22/24 2:48 PM

## 2024-02-22 NOTE — Plan of Care (Signed)
  Problem: Education: Goal: Knowledge of General Education information will improve Description: Including pain rating scale, medication(s)/side effects and non-pharmacologic comfort measures Outcome: Progressing   Problem: Health Behavior/Discharge Planning: Goal: Ability to manage health-related needs will improve Outcome: Progressing   Problem: Clinical Measurements: Goal: Ability to maintain clinical measurements within normal limits will improve Outcome: Progressing Goal: Will remain free from infection Outcome: Progressing Goal: Diagnostic test results will improve Outcome: Progressing Goal: Respiratory complications will improve Outcome: Progressing Goal: Cardiovascular complication will be avoided Outcome: Progressing   Problem: Activity: Goal: Risk for activity intolerance will decrease Outcome: Progressing   Problem: Nutrition: Goal: Adequate nutrition will be maintained Outcome: Progressing   Problem: Coping: Goal: Level of anxiety will decrease Outcome: Progressing   Problem: Elimination: Goal: Will not experience complications related to bowel motility Outcome: Progressing Goal: Will not experience complications related to urinary retention Outcome: Progressing   Problem: Pain Managment: Goal: General experience of comfort will improve and/or be controlled Outcome: Progressing   Problem: Safety: Goal: Ability to remain free from injury will improve Outcome: Progressing   Problem: Skin Integrity: Goal: Risk for impaired skin integrity will decrease Outcome: Progressing   Problem: Activity: Goal: Ability to tolerate increased activity will improve Outcome: Progressing   Problem: Clinical Measurements: Goal: Ability to maintain a body temperature in the normal range will improve Outcome: Progressing   Problem: Respiratory: Goal: Ability to maintain adequate ventilation will improve Outcome: Progressing Goal: Ability to maintain a clear airway  will improve Outcome: Progressing   Problem: Activity: Goal: Ability to tolerate increased activity will improve Outcome: Progressing   Problem: Respiratory: Goal: Ability to maintain a clear airway and adequate ventilation will improve Outcome: Progressing   Problem: Role Relationship: Goal: Method of communication will improve Outcome: Progressing

## 2024-02-22 NOTE — Progress Notes (Signed)
 PROGRESS NOTE    Michelle Barnett  BJY:[id number] DOB: July 19, 1961 DOA: 02/01/2024 PCP: Doreene Nest, NP    Brief Narrative:   63 y.o. female with hypertension, hyperlipidemia, iron deficiency anemia, who presented to the ED on 02/01/2024 with acute hypoxia. Pt is currently intubated, sedated, and unable to contribute to history and no family is currently available, therefore history was obtained per chart review.  patient was previously in her PCP office where she was noted to be 66% on room air, this improved on 3 L O2 but could not get higher than 88%.  EMS was called and brought to the patient to the hospital.  In the ED she was found to be septic with a temperature of 100.7, respirations 20, tachycardic to 113 and hypoxic.  Labs were mostly unrevealing.  Patient underwent CT head, CT cervical spine, CT maxillofacial which revealed soft tissue swelling of the periorbital soft tissues of the left and along the left frontal scalp.  She received DuoNebs, azithromycin, ceftriaxone.  Hospital stay has been prolonged by ongoing hypoxia, inability to wean oxygen, and persistent delirium.  Patient has now completed her course of antibiotics.  We have initiated steroid taper, but she remains on 6 L nasal cannula.   Patient's stay was also complicated by A-fib RVR necessitating cardiology consult, amiodarone drip and heparin drip which have since been transitioned to Eliquis and amiodarone p.o.  A-fib RVR likely provoked by hypoxia and breathing treatments. Patient stay is also been complicated by lip lesions which were initially thought to be impetigo but later resulted positive for HSV.  She has had some improvement with mupirocin and was initiated on valacyclovir."   2/4: Admitted by [facility]. 2/12: Rapid response called due to witnessed Generalized Tonic Clonic seizure, self resolved.  Never lost pulse, however severely hypoxic, required emergent intubation and mechanical ventilation for airway  protection. 2/13: No significant events noted overnight.  MRI Brain last night concerning for PRESS.  Lab unable to obtain blood draws, will place central line for access.  On minimal vent support, requiring minimal Levophed.  Will perform WUQ and SBT as able. 2/14: No events noted overnight.  Plan for LP today at bedside.  On minimal vent support, will plan for WUA/SBT following LP 2/15: Pt extubated to 3L O2 via nasal canula.  Pending speech/PT/OT    2/16 patient was transferred to [facility] service.  Please review previous notes for details, and further management as below.   Assessment & Plan:   Principal Problem:   Pneumonia of right lower lobe due to infectious organism Active Problems:   AKI (acute kidney injury) (HCC)   Essential hypertension   Vitamin B12 deficiency   Adjustment disorder with mixed anxiety and depressed mood   Acute hypoxic respiratory failure (HCC)   GERD (gastroesophageal reflux disease)   Mild intermittent asthma without complication   Hyperlipidemia   Hypoxia   Ecchymosis of left eye   Sepsis (HCC)   Paroxysmal atrial fibrillation (HCC)   Atrial fibrillation with RVR (HCC)   Hiatal hernia   Pneumonia   HSV-1 infection   Acute encephalopathy  # Generalized tonic clonic seizure~resolved # Acute metabolic encephalopathy~resolved  # Concern for PRESS MRI Brain consistent with PRESS - Meningitis/encephalitis panel 2/14: negative  - Neurology consulted: - D/c acyclovir, less likely herpes encephalitis as per ID - Mental status at/near baseline Plan: Continue Keppra 500 twice daily Continue Eliquis 5 g twice daily Daily PT OT, ambulation as tolerated Continue dysphagia 1 diet  Seizure precautions  Medically appropriate for SNF   #Acute hypoxic respiratory failure in the setting of CAP, pulmonary edema, and asthma exacerbation - Supplemental O2 for dyspnea and/or hypoxia  - Maintain O2 sats 92% or higher  - Prn bronchodilator therapy  -Continue p.o.  prednisone, taper slowly 2/17 Mucinex 600 mg p.o. twice daily, Tussionex prn for cough 2/19 persistent hypoxia, continue supplemental admission, ABG reviewed.   2/19 CXR: Interval extubation. Persistent left retrocardiac consolidation/atelectasis. Slightly increased bibasilar opacities. 2/23: Repeat CXR due to worsening SOB.  Persistent infiltrate noted Plan: Lasix prn Wean oxygen as tolerated Currently on 3L, may need long term weaning efforts at [facility]   # Atrial fibrillation with rvr~ currently in NSR # HTN  # Hx: CAD and bicuspid aortic valve  # Valvular abnormality, mitral, tricuspid and aortic valves as below. Plan: Metop 50 BID Losartan 50 every day Amiodarone 200 daily x 1 month, then stop  # HSV-1 Infection~improved # CAP~treated  - Trend WBC and monitor fever curve  - Follow culture results  S/p acyclovir, CSF negative so it has been discontinued. S/p Valtrex 1000 mg twice daily for 2 days, to complete 7-day course, completed Plan: Completed treatement     # Acute kidney Injury~resolved  # Metabolic alkalosis, suspect contraction in setting of diuresis~improving  - Trend BMP  - Strict I&O's - Avoid nephrotoxic agents as able - Replace electrolytes as indicated~pharmacy following for assistance with electrolyte replacement   # Anemia without obvious signs of bleeding  - Trend CBC  - Monitor for s/sx of bleeding  - Transfuse for hgb <7 Anemia workup within normal range Suspect chronic inflammation, no indication for transfusion    DVT prophylaxis: Eliquis Code Status: Full Family Communication: Friend at bedside 2/22, 2/23 Disposition Plan: Status is: Inpatient Remains inpatient appropriate because: Unsafe discharge plan.  Medically appropriate for dispo to SNF.  PASSR pending   Level of care: Telemetry Medical  Consultants:  None  Procedures:  None  Antimicrobials: None    Subjective: Seen and examined.  Shortness of breath improved.  Sitting up  in chair eating.  Objective: Vitals:   02/21/24 2113 02/22/24 0016 02/22/24 0500 02/22/24 0828  BP: (!) 140/67 (!) 107/47  (!) 114/50  Pulse: 69 (!) 52  (!) 59  Resp:  16  16  Temp: 98.1 F (36.7 C) 97.7 F (36.5 C)  98.5 F (36.9 C)  TempSrc: Oral Oral  Oral  SpO2: 97% 93%  93%  Weight:   55.7 kg   Height:        Intake/Output Summary (Last 24 hours) at 02/22/2024 1238 Last data filed at 02/22/2024 0500 Gross per 24 hour  Intake 120 ml  Output 1100 ml  Net -980 ml   Filed Weights   02/19/24 0419 02/20/24 0500 02/22/24 0500  Weight: 58.5 kg 57.4 kg 55.7 kg    Examination:  General exam: Frail, fatigued and chronically ill Respiratory system: Bibasilar crackles.  Normal work of breathing.  3 L Cardiovascular system: S1-S2, RRR, no murmurs, no pedal edema Gastrointestinal system: Thin, soft, NT/ND, normal bowel sounds Central nervous system: Alert and oriented. No focal neurological deficits. Extremities: Symmetric decreased power.  Gait intact Skin: No rashes, lesions or ulcers Psychiatry: Judgement and insight appear impaired. Mood & affect flattened.     Data Reviewed: I have personally reviewed following labs and imaging studies  CBC: Recent Labs  Lab 02/16/24 0529 02/17/24 0517 02/18/24 1009 02/19/24 0414 02/22/24 0922  WBC 8.8 7.6 9.1  7.1 10.4  NEUTROABS  --   --   --   --  8.6*  HGB 9.7* 8.8* 9.5* 9.4* 10.4*  HCT 31.0* 28.4* 29.0* 30.4* 33.4*  MCV 99.0 99.6 95.4 101.0* 100.0  PLT 164 166 212 182 197   Basic Metabolic Panel: Recent Labs  Lab 02/16/24 0529 02/17/24 0517 02/18/24 1009 02/19/24 0414 02/22/24 0922  NA 146* 145 144 144 142  K 4.1 3.8 4.0 4.0 3.3*  CL 107 105 103 101 93*  CO2 30 33* 32 36* 39*  GLUCOSE 95 76 112* 91 107*  BUN 32* 29* 28* 29* 39*  CREATININE 0.79 0.72 0.71 0.78 0.86  CALCIUM 8.8* 8.6* 8.7* 8.6* 8.9  MG 2.1 1.9 1.8 2.0  --   PHOS 2.2* 3.4 3.0 3.6  --    GFR: Estimated Creatinine Clearance: 48 mL/min (by C-G  formula based on SCr of 0.86 mg/dL). Liver Function Tests: No results for input(s): "AST", "ALT", "ALKPHOS", "BILITOT", "PROT", "ALBUMIN" in the last 168 hours.  No results for input(s): "LIPASE", "AMYLASE" in the last 168 hours. No results for input(s): "AMMONIA" in the last 168 hours. Coagulation Profile: No results for input(s): "INR", "PROTIME" in the last 168 hours. Cardiac Enzymes: No results for input(s): "CKTOTAL", "CKMB", "CKMBINDEX", "TROPONINI" in the last 168 hours. BNP (last 3 results) No results for input(s): "PROBNP" in the last 8760 hours. HbA1C: No results for input(s): "HGBA1C" in the last 72 hours. CBG: Recent Labs  Lab 02/15/24 2116 02/16/24 0809 02/16/24 2311 02/17/24 0651  GLUCAP 135* 99 112* 75   Lipid Profile: No results for input(s): "CHOL", "HDL", "LDLCALC", "TRIG", "CHOLHDL", "LDLDIRECT" in the last 72 hours. Thyroid Function Tests: No results for input(s): "TSH", "T4TOTAL", "FREET4", "T3FREE", "THYROIDAB" in the last 72 hours. Anemia Panel: No results for input(s): "VITAMINB12", "FOLATE", "FERRITIN", "TIBC", "IRON", "RETICCTPCT" in the last 72 hours. Sepsis Labs: No results for input(s): "PROCALCITON", "LATICACIDVEN" in the last 168 hours.  No results found for this or any previous visit (from the past 240 hours).        Radiology Studies: No results found.       Scheduled Meds:  amiodarone  200 mg Oral Daily   apixaban  5 mg Oral BID   atorvastatin  40 mg Oral QHS   Chlorhexidine Gluconate Cloth  6 each Topical Daily   cyanocobalamin  1,000 mcg Oral Daily   ezetimibe  10 mg Oral Daily   feeding supplement  237 mL Oral TID BM   guaiFENesin  1,200 mg Oral BID   levETIRAcetam  500 mg Oral BID   losartan  50 mg Oral Daily   metoprolol tartrate  50 mg Oral BID   multivitamin with minerals  1 tablet Oral Daily   mupirocin ointment   Nasal BID   mouth rinse  15 mL Mouth Rinse 4 times per day   pantoprazole  40 mg Oral Daily    polyethylene glycol  17 g Oral Daily   predniSONE  40 mg Oral Q breakfast   QUEtiapine  25 mg Oral QHS   sodium chloride flush  10-40 mL Intracatheter Q12H   thiamine  100 mg Oral Daily   Continuous Infusions:     LOS: 21 days    Tresa Moore, MD [facility]   If 7PM-7AM, please contact night-coverage  02/22/2024, 12:38 PM

## 2024-02-23 DIAGNOSIS — J189 Pneumonia, unspecified organism: Secondary | ICD-10-CM | POA: Diagnosis not present

## 2024-02-23 MED ORDER — QUETIAPINE FUMARATE 25 MG PO TABS
50.0000 mg | ORAL_TABLET | Freq: Every day | ORAL | Status: DC
  Administered 2024-02-23 – 2024-02-24 (×2): 50 mg via ORAL
  Filled 2024-02-23 (×2): qty 2

## 2024-02-23 MED ORDER — ORAL CARE MOUTH RINSE: 15.0000 mL | OROMUCOSAL | Status: DC | PRN

## 2024-02-23 NOTE — Progress Notes (Signed)
 The above named patient is recommended to go to Short Term Rehab for strengthening and gait training for balance.  It is expected that the Short Term Rehab stay will be less than 30 days.  The patient is expected to return home after Rehab.

## 2024-02-23 NOTE — Progress Notes (Signed)
 Occupational Therapy Treatment Patient Details Name: Michelle Barnett MRN: [id number] DOB: Jul 22, 1961 Today's Date: 02/23/2024   History of present illness Pt is a 63 y.o. female admitted following a fall at home with acute metabolic encephalopathy, acute hypoxic respiratory failure in the setting of CAP and acute asthma exacerbation, A.fib with RVR, AKI, anemia without obvious signs of bleeding, and HSV 1 infection. Course complicated on 02/09/24 by generalized tonic clonic seizure with severe hypoxia requiring intubation and mechanical ventilation for airway protection. Pt extubated 02/12/24.   OT comments  Pt and significant other in room state she has been going to the bathroom "in the bed" despite pt stating she is continent. Edu on use of BSC. Pt found to be incontinent of bowel with small BM smear on bed pads, able to perform STS from bed with minA and use of RW. MaxA for pericare in standing. Transfers to recliner using RW, minA with cues for safety and sequencing, pt with posterior bias and often laughing tangetially. Functional mobility using RW, ~20 ft in room with multimodal cues for safety and sequencing. Pt making progress towards goals, OT will continue to follow. Discharge recommendation appropriate. Patient will benefit from continued inpatient follow up therapy, <3 hours/day       If plan is discharge home, recommend the following:  Two people to help with walking and/or transfers;Two people to help with bathing/dressing/bathroom;Assist for transportation;Help with stairs or ramp for entrance;Direct supervision/assist for financial management;Supervision due to cognitive status;Direct supervision/assist for medications management;Assistance with cooking/housework   Equipment Recommendations  Other (comment)    Recommendations for Other Services      Precautions / Restrictions Precautions Precautions: Fall Recall of Precautions/Restrictions: Impaired Restrictions Weight Bearing  Restrictions Per Provider Order: No       Mobility Bed Mobility Overal bed mobility: Needs Assistance       Supine to sit: Contact guard     General bed mobility comments: increased time, CGA    Transfers Overall transfer level: Needs assistance Equipment used: Rolling walker (2 wheels) Transfers: Sit to/from Stand Sit to Stand: Min assist           General transfer comment: cues for hand placement, minA for steadying but pt comes up into standing well     Balance Overall balance assessment: Needs assistance Sitting-balance support: Bilateral upper extremity supported, Feet supported Sitting balance-Leahy Scale: Fair Sitting balance - Comments: CGA for safety due to cognition   Standing balance support: Bilateral upper extremity supported, During functional activity, Reliant on assistive device for balance Standing balance-Leahy Scale: Poor                             ADL either performed or assessed with clinical judgement   ADL Overall ADL's : Needs assistance/impaired                     Lower Body Dressing: Maximal assistance;Sit to/from stand Lower Body Dressing Details (indicate cue type and reason): spouse prefers to assist with socks     Toileting- Clothing Manipulation and Hygiene: Maximal assistance;Sit to/from stand Toileting - Clothing Manipulation Details (indicate cue type and reason): incontinent of bowel, requires maxA for pericare in standing     Functional mobility during ADLs: Minimal assistance;Rolling walker (2 wheels) General ADL Comments: Pt and significant other in room state she has been going to the bathroom "in the bed" despite pt stating she is continent. Edu on use  of BSC. Pt found to be incontinent of bowel with small BM smear on bed pads, able to perform STS from bed with minA and use of RW. MaxA for pericare in standing. Transfers to recliner using RW, minA with cues for safety and sequencing, pt with posterior  bias and often laughing tangetially.     Communication Communication Communication: No apparent difficulties   Cognition Arousal: Alert Behavior During Therapy: Impulsive Cognition: No family/caregiver present to determine baseline   Orientation impairments: Time, Situation Awareness: Intellectual awareness impaired, Online awareness impaired   Attention impairment (select first level of impairment): Sustained attention   OT - Cognition Comments: oriented to self, generally confused and tangetial                 Following commands: Impaired Following commands impaired: Follows one step commands with increased time      Cueing   Cueing Techniques: Verbal cues, Tactile cues, Gestural cues, Visual cues        General Comments NT in room to assist, SpO2 96%    Pertinent Vitals/ Pain       Pain Assessment Pain Assessment: No/denies pain   Frequency  Min 1X/week        Progress Toward Goals  OT Goals(current goals can now be found in the care plan section)  Progress towards OT goals: Progressing toward goals  Acute Rehab OT Goals OT Goal Formulation: With patient Time For Goal Achievement: 02/28/24 Potential to Achieve Goals: Fair ADL Goals Pt Will Perform Grooming: with min assist;sitting Pt Will Perform Upper Body Bathing: sitting;with min assist;with mod assist Pt Will Transfer to Toilet: with max assist;with mod assist;bedside commode;stand pivot transfer  Plan      AM-PAC OT "6 Clicks" Daily Activity     Outcome Measure   Help from another person eating meals?: A Little Help from another person taking care of personal grooming?: A Lot Help from another person toileting, which includes using toliet, bedpan, or urinal?: A Lot Help from another person bathing (including washing, rinsing, drying)?: A Lot Help from another person to put on and taking off regular upper body clothing?: A Lot Help from another person to put on and taking off regular lower  body clothing?: A Lot 6 Click Score: 13    End of Session Equipment Utilized During Treatment: Oxygen;Rolling walker (2 wheels)  OT Visit Diagnosis: Other abnormalities of gait and mobility (R26.89);Muscle weakness (generalized) (M62.81)   Activity Tolerance Patient tolerated treatment well   Patient Left in chair;with call bell/phone within reach;with chair alarm set   Nurse Communication Mobility status        Time: 7253-6644 OT Time Calculation (min): 24 min  Charges: OT General Charges $OT Visit: 1 Visit OT Treatments $Self Care/Home Management : 23-37 mins  Emmagrace Runkel L. Raeleigh Guinn, OTR/L  02/23/24, 1:34 PM

## 2024-02-23 NOTE — Progress Notes (Signed)
 Nutrition Follow-up  DOCUMENTATION CODES:   Not applicable  INTERVENTION:   -Continue MVI with minerals daily -Continue Ensure Enlive po TID, each supplement provides 350 kcal and 20 grams of protein  -Continue Magic cup TID with meals, each supplement provides 290 kcal and 9 grams of protein   NUTRITION DIAGNOSIS:   Inadequate oral intake related to inability to eat (pt sedated and ventilated) as evidenced by NPO status.  Progressing; advanced to PO diet on 02/13/24   GOAL:   Patient will meet greater than or equal to 90% of their needs  Progressing   MONITOR:   PO intake, Supplement acceptance, Diet advancement  REASON FOR ASSESSMENT:   Ventilator    ASSESSMENT:   63 y/o female with h/o HTN, anxiety, depression, GERD, HLD, asthma, hiatal hernia, COPD, PUD, CAD and IDA who is admitted with PNA, HSV-1, AKI, seizures, A-fib RVR and AMS.  2/15- extubated 2/16- s/p BSE- advanced to dysphagia 1 diet with thin liquids  Reviewed I/O's: -210 ml x 24 hours and +155 ml since 02/09/24  UOP: 450 ml x 24 hours   Pt remains on dysphagia 1 diet with thin liquids. Pt tolerating diet well and very grateful for dysphagia 1 diet. Noted meal completions 75-100%. Pt has had variable acceptance of Ensure supplements.   Plan for SNF placement at discharge.   Wt has been stable over the past week.   Medications reviewed and include vitamin B-12, keppra, protonix, miralax, prednisone, and thiamine.   Labs reviewed: K: 3.3, CBGS: 75-112 (inpatient orders for glycemic control are none).    Diet Order:   Diet Order             DIET - DYS 1 Room service appropriate? Yes with Assist; Fluid consistency: Thin  Diet effective now                   EDUCATION NEEDS:   Education needs have been addressed  Skin:  Skin Assessment: Reviewed RN Assessment  Last BM:  02/22/24 (type 6)  Height:   Ht Readings from Last 1 Encounters:  02/01/24 4\' 9"  (1.448 m)    Weight:   Wt  Readings from Last 1 Encounters:  02/23/24 56.4 kg    Ideal Body Weight:  43 kg  BMI:  Body mass index is 26.92 kg/m.  Estimated Nutritional Needs:   Kcal:  1550-1750  Protein:  75-90 grams  Fluid:  > 1.5 L    Levada Schilling, RD, LDN, CDCES Registered Dietitian III Certified Diabetes Care and Education Specialist If unable to reach this RD, please use "RD Inpatient" group chat on secure chat between hours of 8am-4 pm daily

## 2024-02-23 NOTE — Progress Notes (Signed)
 Mobility Specialist - Progress Note   02/23/24 1536  Mobility  Activity Refused mobility     Pt declined mobility; fatigued from recent transfer to bathroom. Will attempt another date/time.   Filiberto Pinks Mobility Specialist 02/23/24, 3:37 PM

## 2024-02-23 NOTE — TOC Progression Note (Addendum)
 Transition of Care [facility]) - Progression Note    Patient Details  Name: Michelle Barnett MRN: [id number] Date of Birth: 06-14-1961  [facility] of [facility] CM/SW Contact  Marlowe Sax, RN Phone Number: 02/23/2024, 10:23 AM  Clinical Narrative:     Was able to speak to [facility] in the room and obtain the physical address to get PASSR [address] [place]'s cell phone is [phone] Uploaded clinical to [place] Must to obtain PASSR Expected Discharge Plan: Skilled Nursing Facility Barriers to Discharge: Continued Medical Work up  Expected Discharge Plan and Services     Post Acute Care Choice: Skilled Nursing Facility Living arrangements for the past 2 months: Single Family Home                                       Social Determinants of Health (SDOH) Interventions SDOH Screenings   Food Insecurity: No Food Insecurity (02/03/2024)  Housing: Low Risk  (02/03/2024)  Transportation Needs: No Transportation Needs (02/03/2024)  Utilities: Not At Risk (02/03/2024)  Depression (PHQ2-9): Low Risk  (09/09/2023)  Social Connections: Moderately Integrated (02/03/2024)  Tobacco Use: Low Risk  (02/03/2024)    Readmission Risk Interventions     No data to display

## 2024-02-23 NOTE — Plan of Care (Signed)
  Problem: Clinical Measurements: Goal: Ability to maintain clinical measurements within normal limits will improve Outcome: Progressing Goal: Diagnostic test results will improve Outcome: Progressing Goal: Respiratory complications will improve Outcome: Progressing   Problem: Activity: Goal: Risk for activity intolerance will decrease Outcome: Progressing   Problem: Nutrition: Goal: Adequate nutrition will be maintained Outcome: Progressing   Problem: Coping: Goal: Level of anxiety will decrease Outcome: Progressing   Problem: Safety: Goal: Ability to remain free from injury will improve Outcome: Progressing   Problem: Activity: Goal: Ability to tolerate increased activity will improve Outcome: Progressing

## 2024-02-23 NOTE — Plan of Care (Signed)
  Problem: Education: Goal: Knowledge of General Education information will improve Description: Including pain rating scale, medication(s)/side effects and non-pharmacologic comfort measures Outcome: Progressing   Problem: Health Behavior/Discharge Planning: Goal: Ability to manage health-related needs will improve Outcome: Progressing   Problem: Clinical Measurements: Goal: Ability to maintain clinical measurements within normal limits will improve Outcome: Progressing Goal: Will remain free from infection Outcome: Progressing Goal: Diagnostic test results will improve Outcome: Progressing Goal: Respiratory complications will improve Outcome: Progressing Goal: Cardiovascular complication will be avoided Outcome: Progressing   Problem: Activity: Goal: Risk for activity intolerance will decrease Outcome: Progressing   Problem: Nutrition: Goal: Adequate nutrition will be maintained Outcome: Progressing   Problem: Coping: Goal: Level of anxiety will decrease Outcome: Progressing   Problem: Elimination: Goal: Will not experience complications related to bowel motility Outcome: Progressing Goal: Will not experience complications related to urinary retention Outcome: Progressing   Problem: Pain Managment: Goal: General experience of comfort will improve and/or be controlled Outcome: Progressing   Problem: Safety: Goal: Ability to remain free from injury will improve Outcome: Progressing   Problem: Skin Integrity: Goal: Risk for impaired skin integrity will decrease Outcome: Progressing   Problem: Activity: Goal: Ability to tolerate increased activity will improve Outcome: Progressing   Problem: Clinical Measurements: Goal: Ability to maintain a body temperature in the normal range will improve Outcome: Progressing   Problem: Respiratory: Goal: Ability to maintain adequate ventilation will improve Outcome: Progressing Goal: Ability to maintain a clear airway  will improve Outcome: Progressing   Problem: Activity: Goal: Ability to tolerate increased activity will improve Outcome: Progressing   Problem: Respiratory: Goal: Ability to maintain a clear airway and adequate ventilation will improve Outcome: Progressing   Problem: Role Relationship: Goal: Method of communication will improve Outcome: Progressing

## 2024-02-23 NOTE — Progress Notes (Signed)
 [facility] Progress Note  Patient: Michelle Barnett    JXB:[id number]  DOA: 02/01/2024     Date of Service: the patient was seen and examined on 02/23/2024  Chief Complaint  Patient presents with   Respiratory Distress   Brief hospital course: 63 y.o. female with hypertension, hyperlipidemia, iron deficiency anemia, who presented to the ED on 02/01/2024 with acute hypoxia. Pt is currently intubated, sedated, and unable to contribute to history and no family is currently available, therefore history was obtained per chart review.  patient was previously in her PCP office where she was noted to be 66% on room air, this improved on 3 L O2 but could not get higher than 88%.  EMS was called and brought to the patient to the hospital.  In the ED she was found to be septic with a temperature of 100.7, respirations 20, tachycardic to 113 and hypoxic.  Labs were mostly unrevealing.  Patient underwent CT head, CT cervical spine, CT maxillofacial which revealed soft tissue swelling of the periorbital soft tissues of the left and along the left frontal scalp.  She received DuoNebs, azithromycin, ceftriaxone.  Hospital stay has been prolonged by ongoing hypoxia, inability to wean oxygen, and persistent delirium.  Patient has now completed her course of antibiotics.  We have initiated steroid taper, but she remains on 6 L nasal cannula.   Patient's stay was also complicated by A-fib RVR necessitating cardiology consult, amiodarone drip and heparin drip which have since been transitioned to Eliquis and amiodarone p.o.  A-fib RVR likely provoked by hypoxia and breathing treatments. Patient stay is also been complicated by lip lesions which were initially thought to be impetigo but later resulted positive for HSV.  She has had some improvement with mupirocin and was initiated on valacyclovir."   2/4: Admitted by [facility]. 2/12: Rapid response called due to witnessed Generalized Tonic Clonic seizure, self resolved.   Never lost pulse, however severely hypoxic, required emergent intubation and mechanical ventilation for airway protection. 2/13: No significant events noted overnight.  MRI Brain last night concerning for PRESS.  Lab unable to obtain blood draws, will place central line for access.  On minimal vent support, requiring minimal Levophed.  Will perform WUQ and SBT as able. 2/14: No events noted overnight.  Plan for LP today at bedside.  On minimal vent support, will plan for WUA/SBT following LP 2/15: Pt extubated to 3L O2 via nasal canula.  Pending speech/PT/OT    2/16 patient was transferred to [facility] service.  Please review previous notes for details, and further management as below.     Assessment and Plan:   Principal Problem:   Pneumonia of right lower lobe due to infectious organism Active Problems:   AKI (acute kidney injury) (HCC)   Essential hypertension   Vitamin B12 deficiency   Adjustment disorder with mixed anxiety and depressed mood   Acute hypoxic respiratory failure (HCC)   GERD (gastroesophageal reflux disease)   Mild intermittent asthma without complication   Hyperlipidemia   Hypoxia   Ecchymosis of left eye   Sepsis (HCC)   Paroxysmal atrial fibrillation (HCC)   Atrial fibrillation with RVR (HCC)   Hiatal hernia   Pneumonia   HSV-1 infection   Acute encephalopathy   # Generalized tonic clonic seizure~resolved # Acute metabolic encephalopathy~resolved  # Concern for PRESS MRI Brain consistent with PRESS - Meningitis/encephalitis panel 2/14: negative  - Neurology consulted: - D/c acyclovir, less likely herpes encephalitis as per ID - Mental status  at/near baseline Plan: Continue Keppra 500 twice daily Continue Eliquis 5 g twice daily Daily PT OT, ambulation as tolerated Continue dysphagia 1 diet Seizure precautions  Medically appropriate for SNF   #Acute hypoxic respiratory failure in the setting of CAP, pulmonary edema, and asthma exacerbation - Supplemental  O2 for dyspnea and/or hypoxia  - Maintain O2 sats 92% or higher  - Prn bronchodilator therapy  -Continue p.o. prednisone, taper slowly 2/17 Mucinex 600 mg p.o. twice daily, Tussionex prn for cough 2/19 persistent hypoxia, continue supplemental admission, ABG reviewed.   2/19 CXR: Interval extubation. Persistent left retrocardiac consolidation/atelectasis. Slightly increased bibasilar opacities. 2/23: Repeat CXR due to worsening SOB.  Persistent infiltrate noted Plan: Lasix prn Wean oxygen as tolerated Currently on 3L, may need long term weaning efforts at [facility]   # Atrial fibrillation with rvr~ currently in NSR # HTN  # Hx: CAD and bicuspid aortic valve  # Valvular abnormality, mitral, tricuspid and aortic valves as below. Plan: Metop 50 BID Losartan 50 every day Amiodarone 200 daily x 1 month, then stop   # HSV-1 Infection~improved # CAP~treated  - Trend WBC and monitor fever curve  - Follow culture results  S/p acyclovir, CSF negative so it has been discontinued. S/p Valtrex 1000 mg twice daily for 2 days, to complete 7-day course, completed Plan: Completed treatement     # Acute kidney Injury~resolved  # Metabolic alkalosis, suspect contraction in setting of diuresis~improving  - Trend BMP  - Strict I&O's - Avoid nephrotoxic agents as able - Replace electrolytes as indicated~pharmacy following for assistance with electrolyte replacement   # Anemia without obvious signs of bleeding  - Trend CBC  - Monitor for s/sx of bleeding  - Transfuse for hgb <7 Anemia workup within normal range Suspect chronic inflammation, no indication for transfusion   Possible sundowning and hospital delirium Patient was on Seroquel 25 mg nightly, increased on 2/26 to Seroquel 50 mg nightly   Body mass index is 26.92 kg/m.  Nutrition Problem: Inadequate oral intake Etiology: inability to eat (pt sedated and ventilated) Interventions: Interventions: Ensure Enlive (each supplement  provides 350kcal and 20 grams of protein), MVI, Magic cup   Diet: Dysphagia 1 diet DVT Prophylaxis: Eliquis  Advance goals of care discussion: Full code  Family Communication: family was present at bedside, at the time of interview.  The pt provided permission to discuss medical plan with the family. Opportunity was given to ask question and all questions were answered satisfactorily.   Disposition:  Pt is from Home, admitted with AMS, seizures, HSV-1, HTN, gradually improved and clinically stable to discharge. Discharge to SNF, when bed will be available..  Subjective: No significant events overnight  Physical Exam: General: NAD, lying comfortably Appear in no distress, affect appropriate Eyes: PERRLA ENT: Oral Mucosa Clear, moist  Neck: no JVD,  Cardiovascular: S1 and S2 Present, no Murmur,  Respiratory: good respiratory effort, Bilateral Air entry equal and Decreased, no Crackles, no wheezes Abdomen: Bowel Sound present, Soft and no tenderness,  Skin: no rashes Extremities: no Pedal edema, no calf tenderness Neurologic: without any new focal findings Gait not checked due to patient safety concerns  Vitals:   02/22/24 2138 02/22/24 2139 02/23/24 0151 02/23/24 0900  BP: 132/65 132/65  (!) 113/52  Pulse: 67 67  62  Resp: 18   17  Temp: 98.2 F (36.8 C)   98.1 F (36.7 C)  TempSrc: Oral   Oral  SpO2: 94%   96%  Weight:  56.4 kg   Height:        Intake/Output Summary (Last 24 hours) at 02/23/2024 1357 Last data filed at 02/23/2024 0900 Gross per 24 hour  Intake 240 ml  Output 450 ml  Net -210 ml   Filed Weights   02/20/24 0500 02/22/24 0500 02/23/24 0151  Weight: 57.4 kg 55.7 kg 56.4 kg    Data Reviewed: I have personally reviewed and interpreted daily labs, tele strips, imagings as discussed above. I reviewed all nursing notes, pharmacy notes, vitals, pertinent old records I have discussed plan of care as described above with RN and  patient/family.  CBC: Recent Labs  Lab 02/17/24 0517 02/18/24 1009 02/19/24 0414 02/22/24 0922  WBC 7.6 9.1 7.1 10.4  NEUTROABS  --   --   --  8.6*  HGB 8.8* 9.5* 9.4* 10.4*  HCT 28.4* 29.0* 30.4* 33.4*  MCV 99.6 95.4 101.0* 100.0  PLT 166 212 182 197   Basic Metabolic Panel: Recent Labs  Lab 02/17/24 0517 02/18/24 1009 02/19/24 0414 02/22/24 0922  NA 145 144 144 142  K 3.8 4.0 4.0 3.3*  CL 105 103 101 93*  CO2 33* 32 36* 39*  GLUCOSE 76 112* 91 107*  BUN 29* 28* 29* 39*  CREATININE 0.72 0.71 0.78 0.86  CALCIUM 8.6* 8.7* 8.6* 8.9  MG 1.9 1.8 2.0  --   PHOS 3.4 3.0 3.6  --     Studies: No results found.  Scheduled Meds:  amiodarone  200 mg Oral Daily   apixaban  5 mg Oral BID   atorvastatin  40 mg Oral QHS   Chlorhexidine Gluconate Cloth  6 each Topical Daily   cyanocobalamin  1,000 mcg Oral Daily   ezetimibe  10 mg Oral Daily   feeding supplement  237 mL Oral TID BM   guaiFENesin  1,200 mg Oral BID   levETIRAcetam  500 mg Oral BID   losartan  50 mg Oral Daily   metoprolol tartrate  50 mg Oral BID   multivitamin with minerals  1 tablet Oral Daily   mupirocin ointment   Nasal BID   mouth rinse  15 mL Mouth Rinse 4 times per day   pantoprazole  40 mg Oral Daily   polyethylene glycol  17 g Oral Daily   predniSONE  40 mg Oral Q breakfast   QUEtiapine  25 mg Oral QHS   sodium chloride flush  10-40 mL Intracatheter Q12H   thiamine  100 mg Oral Daily   Continuous Infusions: PRN Meds: acetaminophen, chlorpheniramine-HYDROcodone, hydrALAZINE, HYDROcodone-acetaminophen, ipratropium-albuterol, senna-docusate, sodium chloride flush  Time spent: 35 minutes  Author: Gillis Santa. MD [facility] 02/23/2024 1:57 PM  To reach On-call, see care teams to locate the attending and reach out to them via [email or web address]. If 7PM-7AM, please contact night-coverage If you still have difficulty reaching the attending provider, please page the [facility] (Director on Call) for  [facility] on amion for assistance.

## 2024-02-23 NOTE — Progress Notes (Signed)
 Physical Therapy Treatment Patient Details Name: Michelle Barnett MRN: [id number] DOB: [birth year] Today's Date: 02/23/2024   History of Present Illness Pt is a 63 y.o. female admitted following a fall at home with acute metabolic encephalopathy, acute hypoxic respiratory failure in the setting of CAP and acute asthma exacerbation, A.fib with RVR, AKI, anemia without obvious signs of bleeding, and HSV 1 infection. Course complicated on 02/09/24 by generalized tonic clonic seizure with severe hypoxia requiring intubation and mechanical ventilation for airway protection. Pt extubated 02/12/24.    PT Comments  Pt showed good effort once PT and family could convince her to participate.  She was able to do 2 small loops in the room but fatigued relatively quickly and needed to sit/rest after ~40 ft of ambulation.  Pt on 2L O2 during the session with sats dropping from mid to low 90s.  PT with no LOBs, but walker reliance and needing close assist and cuing.  Pt will benefit from ongoing PT, continue with POC.     If plan is discharge home, recommend the following: A lot of help with walking and/or transfers;A lot of help with bathing/dressing/bathroom;Assistance with cooking/housework;Direct supervision/assist for medications management;Direct supervision/assist for financial management;Assist for transportation;Help with stairs or ramp for entrance;Supervision due to cognitive status   Can travel by private vehicle     No  Equipment Recommendations  Other (comment)    Recommendations for Other Services       Precautions / Restrictions Precautions Precautions: Fall Recall of Precautions/Restrictions: Impaired Restrictions Weight Bearing Restrictions Per Provider Order: No     Mobility  Bed Mobility Overal bed mobility: Needs Assistance Bed Mobility: Supine to Sit     Supine to sit: Contact guard     General bed mobility comments: extra time/cuing but did not need direct assist     Transfers Overall transfer level: Needs assistance Equipment used: Rolling walker (2 wheels) Transfers: Sit to/from Stand Sit to Stand: Min assist           General transfer comment: struggled to initiate upward movement, further cuing for set up and encouragement    Ambulation/Gait Ambulation/Gait assistance: Contact guard assist Gait Distance (Feet): 40 Feet Assistive device: Rolling walker (2 wheels)         General Gait Details: very slow and guarded with heavy walker use.  Pt's O2 dropped but stayed in the 90s, she faitgued with the effort and and requested to turn back and return to sitting after just initiating second in-room loops   Stairs             Wheelchair Mobility     Tilt Bed    Modified Rankin (Stroke Patients Only)       Balance Overall balance assessment: Needs assistance Sitting-balance support: Bilateral upper extremity supported, Feet supported Sitting balance-Leahy Scale: Fair Sitting balance - Comments: CGA for safety due to cognition   Standing balance support: Bilateral upper extremity supported, During functional activity, Reliant on assistive device for balance Standing balance-Leahy Scale: Poor                              Communication Communication Communication: No apparent difficulties  Cognition Arousal: Alert Behavior During Therapy: Impulsive                             Following commands: Impaired Following commands impaired: Follows one step commands with increased  time    Cueing Cueing Techniques: Verbal cues, Tactile cues, Gestural cues, Visual cues  Exercises      General Comments General comments (skin integrity, edema, etc.): Pt pleased to get up and eager to try some activity with encouragement from PT and family      Pertinent Vitals/Pain Pain Assessment Pain Assessment: No/denies pain    Home Living                          Prior Function            PT  Goals (current goals can now be found in the care plan section) Progress towards PT goals: Progressing toward goals    Frequency    Min 1X/week      PT Plan      Co-evaluation              AM-PAC PT "6 Clicks" Mobility   Outcome Measure  Help needed turning from your back to your side while in a flat bed without using bedrails?: A Little Help needed moving from lying on your back to sitting on the side of a flat bed without using bedrails?: A Little Help needed moving to and from a bed to a chair (including a wheelchair)?: A Little Help needed standing up from a chair using your arms (e.g., wheelchair or bedside chair)?: A Lot Help needed to walk in hospital room?: A Lot Help needed climbing 3-5 steps with a railing? : Total 6 Click Score: 14    End of Session Equipment Utilized During Treatment: Oxygen Activity Tolerance: Patient limited by fatigue Patient left: in bed;with call bell/phone within reach;with bed alarm set Nurse Communication: Mobility status PT Visit Diagnosis: Muscle weakness (generalized) (M62.81);Difficulty in walking, not elsewhere classified (R26.2);Unsteadiness on feet (R26.81)     Time: 8657-8469 PT Time Calculation (min) (ACUTE ONLY): 15 min  Charges:    $Gait Training: 8-22 mins PT General Charges $$ ACUTE PT VISIT: 1 Visit                     Malachi Pro, DPT 02/23/2024, 6:01 PM

## 2024-02-24 DIAGNOSIS — J189 Pneumonia, unspecified organism: Secondary | ICD-10-CM | POA: Diagnosis not present

## 2024-02-24 LAB — MAGNESIUM: Magnesium: 2.3 mg/dL (ref 1.7–2.4)

## 2024-02-24 LAB — BASIC METABOLIC PANEL
Anion gap: 9 (ref 5–15)
BUN: 37 mg/dL — ABNORMAL HIGH (ref 8–23)
CO2: 34 mmol/L — ABNORMAL HIGH (ref 22–32)
Calcium: 8.6 mg/dL — ABNORMAL LOW (ref 8.9–10.3)
Chloride: 95 mmol/L — ABNORMAL LOW (ref 98–111)
Creatinine, Ser: 0.89 mg/dL (ref 0.44–1.00)
GFR, Estimated: >60 mL/min (ref >60)
Glucose, Bld: 91 mg/dL (ref 70–99)
Potassium: 3.8 mmol/L (ref 3.5–5.1)
Sodium: 138 mmol/L (ref 135–145)

## 2024-02-24 LAB — CBC
HCT: 30.4 % — ABNORMAL LOW (ref 36.0–46.0)
Hemoglobin: 9.7 g/dL — ABNORMAL LOW (ref 12.0–15.0)
MCH: 32.2 pg (ref 26.0–34.0)
MCHC: 31.9 g/dL (ref 30.0–36.0)
MCV: 101 fL — ABNORMAL HIGH (ref 80.0–100.0)
Platelets: 134 10*3/uL — ABNORMAL LOW (ref 150–400)
RBC: 3.01 MIL/uL — ABNORMAL LOW (ref 3.87–5.11)
RDW: 17.6 % — ABNORMAL HIGH (ref 11.5–15.5)
WBC: 7.4 10*3/uL (ref 4.0–10.5)
nRBC: 0 % (ref 0.0–0.2)

## 2024-02-24 LAB — PHOSPHORUS: Phosphorus: 3.3 mg/dL (ref 2.5–4.6)

## 2024-02-24 NOTE — TOC Progression Note (Signed)
 Transition of Care [facility]) - Progression Note    Patient Details  Name: DEVORY MCKINZIE MRN: [id number] Date of Birth: August 14, 1961  [facility]) CM/SW Contact  Marlowe Sax, RN Phone Number: 02/24/2024, 11:18 AM  Clinical Narrative:    Sherron Monday with [facility] Maples at [phone] and explained that there are no bed offers in [facility] the closest would be [place], He stated that they need her closer to home, I explained when she is medically ready to DC that they will need to choose a bed offer that is in place or he will have to take her home, I reached out to [facility] in [place] and requested them to review for a bed offer, this is the only local facility still pending    Expected Discharge Plan: Skilled Nursing Facility Barriers to Discharge: Continued Medical Work up  Expected Discharge Plan and Services     Post Acute Care Choice: Skilled Nursing Facility Living arrangements for the past 2 months: Single Family Home                                       Social Determinants of Health (SDOH) Interventions SDOH Screenings   Food Insecurity: No Food Insecurity (02/03/2024)  Housing: Low Risk  (02/03/2024)  Transportation Needs: No Transportation Needs (02/03/2024)  Utilities: Not At Risk (02/03/2024)  Depression (PHQ2-9): Low Risk  (09/09/2023)  Social Connections: Moderately Integrated (02/03/2024)  Tobacco Use: Low Risk  (02/03/2024)    Readmission Risk Interventions     No data to display

## 2024-02-24 NOTE — TOC Progression Note (Signed)
 Transition of Care [facility]) - Progression Note    Patient Details  Name: Michelle Barnett MRN: [id number] Date of Birth: 1961-02-12  Transition of Care [facility]) CM/SW Contact  Marlowe Sax, RN Phone Number: 02/24/2024, 11:13 AM  Clinical Narrative:    PASSR is still pending,    Expected Discharge Plan: Skilled Nursing Facility Barriers to Discharge: Continued Medical Work up  Expected Discharge Plan and Services     Post Acute Care Choice: Skilled Nursing Facility Living arrangements for the past 2 months: Single Family Home                                       Social Determinants of Health (SDOH) Interventions SDOH Screenings   Food Insecurity: No Food Insecurity (02/03/2024)  Housing: Low Risk  (02/03/2024)  Transportation Needs: No Transportation Needs (02/03/2024)  Utilities: Not At Risk (02/03/2024)  Depression (PHQ2-9): Low Risk  (09/09/2023)  Social Connections: Moderately Integrated (02/03/2024)  Tobacco Use: Low Risk  (02/03/2024)    Readmission Risk Interventions     No data to display

## 2024-02-24 NOTE — Progress Notes (Signed)
 Mobility Specialist - Progress Note   02/24/24 1000  Mobility  Activity Refused mobility     Pt politely declined mobility at this time d/t fatigue. Family at bedside states she just returned to bed after sitting for approximately an hour. Pt asked to return later this date. Will attempt another time.    Filiberto Pinks Mobility Specialist 02/24/24, 10:51 AM

## 2024-02-24 NOTE — Plan of Care (Signed)
  Problem: Education: Goal: Knowledge of General Education information will improve Description: Including pain rating scale, medication(s)/side effects and non-pharmacologic comfort measures Outcome: Progressing   Problem: Health Behavior/Discharge Planning: Goal: Ability to manage health-related needs will improve Outcome: Progressing   Problem: Clinical Measurements: Goal: Ability to maintain clinical measurements within normal limits will improve Outcome: Progressing Goal: Will remain free from infection Outcome: Progressing Goal: Diagnostic test results will improve Outcome: Progressing Goal: Respiratory complications will improve Outcome: Progressing Goal: Cardiovascular complication will be avoided Outcome: Progressing   Problem: Activity: Goal: Risk for activity intolerance will decrease Outcome: Progressing   Problem: Nutrition: Goal: Adequate nutrition will be maintained Outcome: Progressing   Problem: Coping: Goal: Level of anxiety will decrease Outcome: Progressing   Problem: Elimination: Goal: Will not experience complications related to bowel motility Outcome: Progressing Goal: Will not experience complications related to urinary retention Outcome: Progressing   Problem: Pain Managment: Goal: General experience of comfort will improve and/or be controlled Outcome: Progressing   Problem: Safety: Goal: Ability to remain free from injury will improve Outcome: Progressing   Problem: Skin Integrity: Goal: Risk for impaired skin integrity will decrease Outcome: Progressing   Problem: Activity: Goal: Ability to tolerate increased activity will improve Outcome: Progressing   Problem: Clinical Measurements: Goal: Ability to maintain a body temperature in the normal range will improve Outcome: Progressing   Problem: Respiratory: Goal: Ability to maintain adequate ventilation will improve Outcome: Progressing Goal: Ability to maintain a clear airway  will improve Outcome: Progressing   Problem: Activity: Goal: Ability to tolerate increased activity will improve Outcome: Progressing   Problem: Respiratory: Goal: Ability to maintain a clear airway and adequate ventilation will improve Outcome: Progressing   Problem: Role Relationship: Goal: Method of communication will improve Outcome: Progressing

## 2024-02-24 NOTE — TOC Progression Note (Signed)
 Transition of Care [facility]) - Progression Note    Patient Details  Name: Michelle Barnett MRN: [id number] Date of Birth: [birth year]  Transition of Care [facility]) CM/SW Contact  Marlowe Sax, RN Phone Number: 02/24/2024, 12:24 PM  Clinical Narrative:     Obtained PASSR [id number], [facility] made a bed offer, I notified Sherrine Maples and they acepted the bed offer remains on oxygen  Expected Discharge Plan: Skilled Nursing Facility Barriers to Discharge: Continued Medical Work up  Expected Discharge Plan and Services     Post Acute Care Choice: Skilled Nursing Facility Living arrangements for the past 2 months: Single Family Home                                       Social Determinants of Health (SDOH) Interventions SDOH Screenings   Food Insecurity: No Food Insecurity (02/03/2024)  Housing: Low Risk  (02/03/2024)  Transportation Needs: No Transportation Needs (02/03/2024)  Utilities: Not At Risk (02/03/2024)  Depression (PHQ2-9): Low Risk  (09/09/2023)  Social Connections: Moderately Integrated (02/03/2024)  Tobacco Use: Low Risk  (02/03/2024)    Readmission Risk Interventions     No data to display

## 2024-02-24 NOTE — Plan of Care (Signed)
  Problem: Clinical Measurements: Goal: Ability to maintain clinical measurements within normal limits will improve Outcome: Progressing   Problem: Clinical Measurements: Goal: Diagnostic test results will improve Outcome: Progressing   Problem: Clinical Measurements: Goal: Respiratory complications will improve Outcome: Progressing   Problem: Activity: Goal: Risk for activity intolerance will decrease Outcome: Progressing   

## 2024-02-24 NOTE — Progress Notes (Signed)
 Occupational Therapy Treatment Patient Details Name: Michelle Barnett MRN: [id number] DOB: August 01, 1961 Today's Date: 02/24/2024   History of present illness Pt is a 63 y.o. female admitted following a fall at home with acute metabolic encephalopathy, acute hypoxic respiratory failure in the setting of CAP and acute asthma exacerbation, A.fib with RVR, AKI, anemia without obvious signs of bleeding, and HSV 1 infection. Course complicated on 02/09/24 by generalized tonic clonic seizure with severe hypoxia requiring intubation and mechanical ventilation for airway protection. Pt extubated 02/12/24.   OT comments  Pt received on BSC in room with friend Michelle Barnett present. Pt washes face seated on BSC while attempting BM. Caregiver present is polite, but refuses OT assist for patient pericare and transfers. Caregiver transferring patient by lifting her up under her arms to perform a squat pivot type to stand for pericare, OT intervenes to provide CGA for safety during standing balance, and educates at transfer techniques for improved patient independence with caregiver politely dismissive of all attempts. Pt with posterior LOB during caregiver assisted transfer, able to correct due to caregiver's close proximity but overall unsafe technique. Pt is at an increased risk for falls due to unsafe transfer attempts with caregiver. Caregiver would benefit from education but does not seem receptive at this time. OT will continue to follow, and progress patient as able. Sent message to [facility] regarding concerns, discussed with RN. Discharge recommendation appropriate. Patient will benefit from continued inpatient follow up therapy, <3 hours/day       If plan is discharge home, recommend the following:  Two people to help with walking and/or transfers;Two people to help with bathing/dressing/bathroom;Assist for transportation;Help with stairs or ramp for entrance;Direct supervision/assist for financial management;Supervision due  to cognitive status;Direct supervision/assist for medications management;Assistance with cooking/housework   Equipment Recommendations  Other (comment)    Recommendations for Other Services      Precautions / Restrictions Precautions Precautions: Fall Recall of Precautions/Restrictions: Impaired Restrictions Weight Bearing Restrictions Per Provider Order: No       Mobility Bed Mobility Overal bed mobility: Needs Assistance Bed Mobility: Sit to Supine                Transfers Overall transfer level: Needs assistance Equipment used: None Transfers: Bed to chair/wheelchair/BSC Sit to Stand: Min assist   Squat pivot transfers: Min assist       General transfer comment: Caregiver performing transfer, pulling up patient from under arms, not receptive to education or OT attempts at intervening to suggest safer method with increased patient independence     Balance Overall balance assessment: Needs assistance Sitting-balance support: Bilateral upper extremity supported, Feet supported Sitting balance-Leahy Scale: Fair     Standing balance support: Bilateral upper extremity supported, During functional activity                               ADL either performed or assessed with clinical judgement   ADL Overall ADL's : Needs assistance/impaired     Grooming: Wash/dry face;Wash/dry hands;Sitting Grooming Details (indicate cue type and reason): sitting on [facility]                 Toilet Transfer: Contact guard Games developer Details (indicate cue type and reason): friend in room insists on helping patient, OT provides CGA for transfer and attempts to educate and intervene Toileting- Clothing Manipulation and Hygiene: Maximal assistance;Sit to/from stand Toileting - Clothing Manipulation Details (indicate cue type and reason):  friend assists with pericare in unsafe manner, OT provides CGA for standing balance              Communication Communication Communication: No apparent difficulties   Cognition Arousal: Alert Behavior During Therapy: WFL for tasks assessed/performed Cognition: Cognition impaired     Awareness: Intellectual awareness impaired, Online awareness impaired   Attention impairment (select first level of impairment): Sustained attention Executive functioning impairment (select all impairments): Problem solving, Sequencing, Organization, Reasoning OT - Cognition Comments: oriented to self, generally confused and tangetial                 Following commands: Impaired Following commands impaired: Follows one step commands with increased time      Cueing   Cueing Techniques: Verbal cues, Tactile cues, Gestural cues, Visual cues  Exercises              Pertinent Vitals/ Pain       Pain Assessment Pain Assessment: No/denies pain         Frequency  Min 1X/week        Progress Toward Goals  OT Goals(current goals can now be found in the care plan section)  Progress towards OT goals: Progressing toward goals  Acute Rehab OT Goals OT Goal Formulation: With patient Time For Goal Achievement: 02/28/24 Potential to Achieve Goals: Fair ADL Goals Pt Will Perform Grooming: with min assist;sitting Pt Will Perform Upper Body Bathing: sitting;with min assist;with mod assist Pt Will Transfer to Toilet: with max assist;with mod assist;bedside commode;stand pivot transfer  Plan         AM-PAC OT "6 Clicks" Daily Activity     Outcome Measure   Help from another person eating meals?: A Little Help from another person taking care of personal grooming?: A Lot Help from another person toileting, which includes using toliet, bedpan, or urinal?: A Lot Help from another person bathing (including washing, rinsing, drying)?: A Lot Help from another person to put on and taking off regular upper body clothing?: A Lot Help from another person to put on and taking off regular  lower body clothing?: A Lot 6 Click Score: 13    End of Session Equipment Utilized During Treatment: Oxygen  OT Visit Diagnosis: Other abnormalities of gait and mobility (R26.89);Muscle weakness (generalized) (M62.81)   Activity Tolerance Patient tolerated treatment well   Patient Left in bed;with family/visitor present;with call bell/phone within reach   Nurse Communication Mobility status (concerns with pt's friend in room causing increased safety risk)        Time: 7829-5621 OT Time Calculation (min): 17 min  Charges: OT General Charges $OT Visit: 1 Visit OT Treatments $Self Care/Home Management : 8-22 mins Colsen Modi L. Roseline Ebarb, OTR/L  02/24/24, 4:37 PM

## 2024-02-24 NOTE — Progress Notes (Signed)
 [facility] Progress Note  Patient: Michelle Barnett    EXB:[id number]  DOA: 02/01/2024     Date of Service: the patient was seen and examined on 02/24/2024  Chief Complaint  Patient presents with   Respiratory Distress   Brief hospital course: 63 y.o. female with hypertension, hyperlipidemia, iron deficiency anemia, who presented to the ED on 02/01/2024 with acute hypoxia. Pt is currently intubated, sedated, and unable to contribute to history and no family is currently available, therefore history was obtained per chart review.  patient was previously in her PCP office where she was noted to be 66% on room air, this improved on 3 L O2 but could not get higher than 88%.  EMS was called and brought to the patient to the hospital.  In the ED she was found to be septic with a temperature of 100.7, respirations 20, tachycardic to 113 and hypoxic.  Labs were mostly unrevealing.  Patient underwent CT head, CT cervical spine, CT maxillofacial which revealed soft tissue swelling of the periorbital soft tissues of the left and along the left frontal scalp.  She received DuoNebs, azithromycin, ceftriaxone.  Hospital stay has been prolonged by ongoing hypoxia, inability to wean oxygen, and persistent delirium.  Patient has now completed her course of antibiotics.  We have initiated steroid taper, but she remains on 6 L nasal cannula.   Patient's stay was also complicated by A-fib RVR necessitating cardiology consult, amiodarone drip and heparin drip which have since been transitioned to Eliquis and amiodarone p.o.  A-fib RVR likely provoked by hypoxia and breathing treatments. Patient stay is also been complicated by lip lesions which were initially thought to be impetigo but later resulted positive for HSV.  She has had some improvement with mupirocin and was initiated on valacyclovir."   2/4: Admitted by [facility]. 2/12: Rapid response called due to witnessed Generalized Tonic Clonic seizure, self resolved.   Never lost pulse, however severely hypoxic, required emergent intubation and mechanical ventilation for airway protection. 2/13: No significant events noted overnight.  MRI Brain last night concerning for PRESS.  Lab unable to obtain blood draws, will place central line for access.  On minimal vent support, requiring minimal Levophed.  Will perform WUQ and SBT as able. 2/14: No events noted overnight.  Plan for LP today at bedside.  On minimal vent support, will plan for WUA/SBT following LP 2/15: Pt extubated to 3L O2 via nasal canula.  Pending speech/PT/OT    2/16 patient was transferred to [facility] service.  Please review previous notes for details, and further management as below.     Assessment and Plan:   Principal Problem:   Pneumonia of right lower lobe due to infectious organism Active Problems:   AKI (acute kidney injury) (HCC)   Essential hypertension   Vitamin B12 deficiency   Adjustment disorder with mixed anxiety and depressed mood   Acute hypoxic respiratory failure (HCC)   GERD (gastroesophageal reflux disease)   Mild intermittent asthma without complication   Hyperlipidemia   Hypoxia   Ecchymosis of left eye   Sepsis (HCC)   Paroxysmal atrial fibrillation (HCC)   Atrial fibrillation with RVR (HCC)   Hiatal hernia   Pneumonia   HSV-1 infection   Acute encephalopathy   # Generalized tonic clonic seizure~resolved # Acute metabolic encephalopathy~resolved  # Concern for PRESS MRI Brain consistent with PRESS - Meningitis/encephalitis panel 2/14: negative  - Neurology consulted: - D/c acyclovir, less likely herpes encephalitis as per ID - Mental status  at/near baseline Plan: Continue Keppra 500 twice daily Continue Eliquis 5 g twice daily Daily PT OT, ambulation as tolerated Continue dysphagia 1 diet Seizure precautions  Medically appropriate for SNF   #Acute hypoxic respiratory failure in the setting of CAP, pulmonary edema, and asthma exacerbation - Supplemental  O2 for dyspnea and/or hypoxia  - Maintain O2 sats 92% or higher  - Prn bronchodilator therapy  -Continue p.o. prednisone, taper slowly 2/17 Mucinex 600 mg p.o. twice daily, Tussionex prn for cough 2/19 persistent hypoxia, continue supplemental admission, ABG reviewed.   2/19 CXR: Interval extubation. Persistent left retrocardiac consolidation/atelectasis. Slightly increased bibasilar opacities. 2/23: Repeat CXR due to worsening SOB.  Persistent infiltrate noted Plan: Lasix prn Wean oxygen as tolerated Currently on 3L, may need long term weaning efforts at [facility]   # Atrial fibrillation with rvr~ currently in NSR # HTN  # Hx: CAD and bicuspid aortic valve  # Valvular abnormality, mitral, tricuspid and aortic valves as below. Plan: Metoprolol  50 BID Losartan 50 every day Amiodarone 200 daily x 1 month, then stop, till 03/20/2024   # HSV-1 Infection~improved # CAP~treated  - Trend WBC and monitor fever curve  - Follow culture results  S/p acyclovir, CSF negative so it has been discontinued. S/p Valtrex 1000 mg twice daily for 2 days, to complete 7-day course, completed Plan: Completed treatement     # Acute kidney Injury~resolved  # Metabolic alkalosis, suspect contraction in setting of diuresis~improving  - Trend BMP  - Strict I&O's - Avoid nephrotoxic agents as able - Replace electrolytes as indicated~pharmacy following for assistance with electrolyte replacement   # Anemia without obvious signs of bleeding  - Trend CBC  - Monitor for s/sx of bleeding  - Transfuse for hgb <7 Anemia workup within normal range Suspect chronic inflammation, no indication for transfusion   Possible sundowning and hospital delirium Patient was on Seroquel 25 mg nightly, increased on 2/26 to Seroquel 50 mg nightly   Body mass index is 26.91 kg/m.  Nutrition Problem: Inadequate oral intake Etiology: inability to eat (pt sedated and ventilated) Interventions: Interventions: Ensure Enlive  (each supplement provides 350kcal and 20 grams of protein), MVI, Magic cup   Diet: Dysphagia 1 diet DVT Prophylaxis: Eliquis  Advance goals of care discussion: Full code  Family Communication: family was present at bedside, at the time of interview.  The pt provided permission to discuss medical plan with the family. Opportunity was given to ask question and all questions were answered satisfactorily.   Disposition:  Pt is from Home, admitted with AMS, seizures, HSV-1, HTN, gradually improved and clinically stable to discharge. Discharge to SNF, when bed will be available. 2/27 insurance Auth pending as per TOC   Subjective: No significant events overnight, still has mild cough productive cough, overall feels better.  Mild shortness of breath, no chest pain or palpitations.  Physical Exam: General: NAD, lying comfortably Appear in no distress, affect appropriate Eyes: PERRLA ENT: Oral Mucosa Clear, moist  Neck: no JVD,  Cardiovascular: S1 and S2 Present, no Murmur,  Respiratory: good respiratory effort, Bilateral Air entry equal and Decreased, no Crackles, no wheezes Abdomen: Bowel Sound present, Soft and no tenderness,  Skin: no rashes Extremities: no Pedal edema, no calf tenderness Neurologic: without any new focal findings Gait not checked due to patient safety concerns  Vitals:   02/23/24 0900 02/24/24 0046 02/24/24 0406 02/24/24 0821  BP: (!) 113/52 (!) 95/45  (!) 140/60  Pulse: 62 (!) 55  (!) 56  Resp: 17 18  16   Temp: 98.1 F (36.7 C) 98.6 F (37 C)    TempSrc: Oral Oral    SpO2: 96% 90%  95%  Weight:   56.4 kg   Height:        Intake/Output Summary (Last 24 hours) at 02/24/2024 1604 Last data filed at 02/24/2024 1044 Gross per 24 hour  Intake 0 ml  Output --  Net 0 ml   Filed Weights   02/22/24 0500 02/23/24 0151 02/24/24 0406  Weight: 55.7 kg 56.4 kg 56.4 kg    Data Reviewed: I have personally reviewed and interpreted daily labs, tele strips,  imagings as discussed above. I reviewed all nursing notes, pharmacy notes, vitals, pertinent old records I have discussed plan of care as described above with RN and patient/family.  CBC: Recent Labs  Lab 02/18/24 1009 02/19/24 0414 02/22/24 0922 02/24/24 0437  WBC 9.1 7.1 10.4 7.4  NEUTROABS  --   --  8.6*  --   HGB 9.5* 9.4* 10.4* 9.7*  HCT 29.0* 30.4* 33.4* 30.4*  MCV 95.4 101.0* 100.0 101.0*  PLT 212 182 197 134*   Basic Metabolic Panel: Recent Labs  Lab 02/18/24 1009 02/19/24 0414 02/22/24 0922 02/24/24 0437  NA 144 144 142 138  K 4.0 4.0 3.3* 3.8  CL 103 101 93* 95*  CO2 32 36* 39* 34*  GLUCOSE 112* 91 107* 91  BUN 28* 29* 39* 37*  CREATININE 0.71 0.78 0.86 0.89  CALCIUM 8.7* 8.6* 8.9 8.6*  MG 1.8 2.0  --  2.3  PHOS 3.0 3.6  --  3.3    Studies: No results found.  Scheduled Meds:  amiodarone  200 mg Oral Daily   apixaban  5 mg Oral BID   atorvastatin  40 mg Oral QHS   Chlorhexidine Gluconate Cloth  6 each Topical Daily   cyanocobalamin  1,000 mcg Oral Daily   ezetimibe  10 mg Oral Daily   feeding supplement  237 mL Oral TID BM   guaiFENesin  1,200 mg Oral BID   levETIRAcetam  500 mg Oral BID   losartan  50 mg Oral Daily   metoprolol tartrate  50 mg Oral BID   multivitamin with minerals  1 tablet Oral Daily   mupirocin ointment   Nasal BID   mouth rinse  15 mL Mouth Rinse 4 times per day   pantoprazole  40 mg Oral Daily   polyethylene glycol  17 g Oral Daily   predniSONE  40 mg Oral Q breakfast   QUEtiapine  50 mg Oral QHS   sodium chloride flush  10-40 mL Intracatheter Q12H   thiamine  100 mg Oral Daily   Continuous Infusions: PRN Meds: acetaminophen, chlorpheniramine-HYDROcodone, hydrALAZINE, HYDROcodone-acetaminophen, ipratropium-albuterol, mouth rinse, mouth rinse, mouth rinse, mouth rinse, senna-docusate, sodium chloride flush  Time spent: 35 minutes  Author: Gillis Santa. MD [facility] 02/24/2024 4:04 PM  To reach On-call, see  care teams to locate the attending and reach out to them via [email or web address]. If 7PM-7AM, please contact night-coverage If you still have difficulty reaching the attending provider, please page the [facility] (Director on Call) for [facility] on amion for assistance.

## 2024-02-25 DIAGNOSIS — J189 Pneumonia, unspecified organism: Secondary | ICD-10-CM | POA: Diagnosis not present

## 2024-02-25 MED ORDER — SENNOSIDES-DOCUSATE SODIUM 8.6-50 MG PO TABS: 1.0000 | ORAL_TABLET | Freq: Every evening | ORAL | Status: AC | PRN

## 2024-02-25 MED ORDER — METOPROLOL TARTRATE 50 MG PO TABS: 50.0000 mg | ORAL_TABLET | Freq: Two times a day (BID) | ORAL | Status: AC

## 2024-02-25 MED ORDER — GUAIFENESIN ER 600 MG PO TB12: 600.0000 mg | ORAL_TABLET | Freq: Two times a day (BID) | ORAL | Status: AC

## 2024-02-25 MED ORDER — POLYETHYLENE GLYCOL 3350 17 G PO PACK: 17.0000 g | PACK | Freq: Every day | ORAL | Status: AC

## 2024-02-25 MED ORDER — AMIODARONE HCL 200 MG PO TABS: 200.0000 mg | ORAL_TABLET | Freq: Every day | ORAL | 0 refills | Status: DC

## 2024-02-25 MED ORDER — EZETIMIBE 10 MG PO TABS: 10.0000 mg | ORAL_TABLET | Freq: Every day | ORAL | Status: DC

## 2024-02-25 MED ORDER — PREDNISONE 20 MG PO TABS: ORAL_TABLET | ORAL | Status: AC

## 2024-02-25 MED ORDER — LEVETIRACETAM 500 MG PO TABS: 500.0000 mg | ORAL_TABLET | Freq: Two times a day (BID) | ORAL | Status: DC

## 2024-02-25 MED ORDER — LOSARTAN POTASSIUM 50 MG PO TABS: 50.0000 mg | ORAL_TABLET | Freq: Every day | ORAL | Status: DC

## 2024-02-25 MED ORDER — PANTOPRAZOLE SODIUM 40 MG PO TBEC: 40.0000 mg | DELAYED_RELEASE_TABLET | Freq: Every day | ORAL | Status: DC

## 2024-02-25 MED ORDER — QUETIAPINE FUMARATE 50 MG PO TABS: 50.0000 mg | ORAL_TABLET | Freq: Every evening | ORAL | Status: DC | PRN

## 2024-02-25 MED ORDER — APIXABAN 5 MG PO TABS: 5.0000 mg | ORAL_TABLET | Freq: Two times a day (BID) | ORAL | Status: DC

## 2024-02-25 NOTE — Plan of Care (Signed)
 Problem: Education: Goal: Knowledge of General Education information will improve Description: Including pain rating scale, medication(s)/side effects and non-pharmacologic comfort measures 02/25/2024 1655 by Collene Gobble, RN Outcome: Adequate for Discharge 02/25/2024 1655 by Collene Gobble, RN Outcome: Progressing 02/25/2024 1347 by Collene Gobble, RN Outcome: Progressing   Problem: Health Behavior/Discharge Planning: Goal: Ability to manage health-related needs will improve 02/25/2024 1655 by Collene Gobble, RN Outcome: Adequate for Discharge 02/25/2024 1655 by Collene Gobble, RN Outcome: Progressing 02/25/2024 1347 by Collene Gobble, RN Outcome: Progressing   Problem: Clinical Measurements: Goal: Ability to maintain clinical measurements within normal limits will improve 02/25/2024 1655 by Collene Gobble, RN Outcome: Adequate for Discharge 02/25/2024 1655 by Collene Gobble, RN Outcome: Progressing 02/25/2024 1347 by Collene Gobble, RN Outcome: Progressing Goal: Will remain free from infection 02/25/2024 1655 by Collene Gobble, RN Outcome: Adequate for Discharge 02/25/2024 1655 by Collene Gobble, RN Outcome: Progressing 02/25/2024 1347 by Collene Gobble, RN Outcome: Progressing Goal: Diagnostic test results will improve 02/25/2024 1655 by Collene Gobble, RN Outcome: Adequate for Discharge 02/25/2024 1655 by Collene Gobble, RN Outcome: Progressing 02/25/2024 1347 by Collene Gobble, RN Outcome: Progressing Goal: Respiratory complications will improve 02/25/2024 1655 by Collene Gobble, RN Outcome: Adequate for Discharge 02/25/2024 1655 by Collene Gobble, RN Outcome: Progressing 02/25/2024 1347 by Collene Gobble, RN Outcome: Progressing Goal: Cardiovascular complication will be avoided 02/25/2024 1655 by Collene Gobble, RN Outcome: Adequate for Discharge 02/25/2024 1655 by Collene Gobble, RN Outcome: Progressing 02/25/2024 1347 by Collene Gobble, RN Outcome:  Progressing   Problem: Activity: Goal: Risk for activity intolerance will decrease 02/25/2024 1655 by Collene Gobble, RN Outcome: Adequate for Discharge 02/25/2024 1655 by Collene Gobble, RN Outcome: Progressing 02/25/2024 1347 by Collene Gobble, RN Outcome: Progressing   Problem: Nutrition: Goal: Adequate nutrition will be maintained 02/25/2024 1655 by Collene Gobble, RN Outcome: Adequate for Discharge 02/25/2024 1655 by Collene Gobble, RN Outcome: Progressing 02/25/2024 1347 by Collene Gobble, RN Outcome: Progressing   Problem: Coping: Goal: Level of anxiety will decrease 02/25/2024 1655 by Collene Gobble, RN Outcome: Adequate for Discharge 02/25/2024 1655 by Collene Gobble, RN Outcome: Progressing 02/25/2024 1347 by Collene Gobble, RN Outcome: Progressing   Problem: Elimination: Goal: Will not experience complications related to bowel motility 02/25/2024 1655 by Collene Gobble, RN Outcome: Adequate for Discharge 02/25/2024 1655 by Collene Gobble, RN Outcome: Progressing 02/25/2024 1347 by Collene Gobble, RN Outcome: Progressing Goal: Will not experience complications related to urinary retention 02/25/2024 1655 by Collene Gobble, RN Outcome: Adequate for Discharge 02/25/2024 1655 by Collene Gobble, RN Outcome: Progressing 02/25/2024 1347 by Collene Gobble, RN Outcome: Progressing   Problem: Pain Managment: Goal: General experience of comfort will improve and/or be controlled 02/25/2024 1655 by Collene Gobble, RN Outcome: Adequate for Discharge 02/25/2024 1655 by Collene Gobble, RN Outcome: Progressing 02/25/2024 1347 by Collene Gobble, RN Outcome: Progressing   Problem: Safety: Goal: Ability to remain free from injury will improve 02/25/2024 1655 by Collene Gobble, RN Outcome: Adequate for Discharge 02/25/2024 1655 by Collene Gobble, RN Outcome: Progressing 02/25/2024 1347 by Collene Gobble, RN Outcome: Progressing   Problem: Skin Integrity: Goal:  Risk for impaired skin integrity will decrease 02/25/2024 1655 by Collene Gobble, RN Outcome: Adequate for Discharge 02/25/2024 1655 by Collene Gobble, RN Outcome: Progressing 02/25/2024 1347 by Manus Rudd,  Shawn Route, RN Outcome: Progressing   Problem: Activity: Goal: Ability to tolerate increased activity will improve 02/25/2024 1655 by Collene Gobble, RN Outcome: Adequate for Discharge 02/25/2024 1655 by Collene Gobble, RN Outcome: Progressing 02/25/2024 1347 by Collene Gobble, RN Outcome: Progressing   Problem: Clinical Measurements: Goal: Ability to maintain a body temperature in the normal range will improve 02/25/2024 1655 by Collene Gobble, RN Outcome: Adequate for Discharge 02/25/2024 1655 by Collene Gobble, RN Outcome: Progressing 02/25/2024 1347 by Collene Gobble, RN Outcome: Progressing   Problem: Respiratory: Goal: Ability to maintain adequate ventilation will improve 02/25/2024 1655 by Collene Gobble, RN Outcome: Adequate for Discharge 02/25/2024 1655 by Collene Gobble, RN Outcome: Progressing 02/25/2024 1347 by Collene Gobble, RN Outcome: Progressing Goal: Ability to maintain a clear airway will improve 02/25/2024 1655 by Collene Gobble, RN Outcome: Adequate for Discharge 02/25/2024 1655 by Collene Gobble, RN Outcome: Progressing 02/25/2024 1347 by Collene Gobble, RN Outcome: Progressing   Problem: Activity: Goal: Ability to tolerate increased activity will improve 02/25/2024 1655 by Collene Gobble, RN Outcome: Adequate for Discharge 02/25/2024 1655 by Collene Gobble, RN Outcome: Progressing 02/25/2024 1347 by Collene Gobble, RN Outcome: Progressing   Problem: Respiratory: Goal: Ability to maintain a clear airway and adequate ventilation will improve 02/25/2024 1655 by Collene Gobble, RN Outcome: Adequate for Discharge 02/25/2024 1655 by Collene Gobble, RN Outcome: Progressing 02/25/2024 1347 by Collene Gobble, RN Outcome: Progressing   Problem: Role  Relationship: Goal: Method of communication will improve 02/25/2024 1655 by Collene Gobble, RN Outcome: Adequate for Discharge 02/25/2024 1655 by Collene Gobble, RN Outcome: Progressing 02/25/2024 1347 by Collene Gobble, RN Outcome: Progressing

## 2024-02-25 NOTE — Progress Notes (Signed)
 Report called [facility] followed questions answered, to [facility]

## 2024-02-25 NOTE — TOC Progression Note (Addendum)
 Transition of Care [facility]) - Progression Note    Patient Details  Name: Michelle Barnett MRN: [id number] Date of Birth: 07-24-1961  Transition of Care [facility]) CM/SW Contact  Hetty Ely, RN Phone Number: 02/25/2024, 2:18 PM  Clinical Narrative:  SNF Pending. Plan Auth ID: [id number]  3:23 pm Ins. approved for 7 days. next review date: 3.7.2025. [facility] notified, pending reply/ admission date.     Expected Discharge Plan: Skilled Nursing Facility Barriers to Discharge: Continued Medical Work up  Expected Discharge Plan and Services     Post Acute Care Choice: Skilled Nursing Facility Living arrangements for the past 2 months: Single Family Home                                       Social Determinants of Health (SDOH) Interventions SDOH Screenings   Food Insecurity: No Food Insecurity (02/03/2024)  Housing: Low Risk  (02/03/2024)  Transportation Needs: No Transportation Needs (02/03/2024)  Utilities: Not At Risk (02/03/2024)  Depression (PHQ2-9): Low Risk  (09/09/2023)  Social Connections: Moderately Integrated (02/03/2024)  Tobacco Use: Low Risk  (02/03/2024)    Readmission Risk Interventions     No data to display

## 2024-02-25 NOTE — Progress Notes (Signed)
 [facility] Progress Note  Patient: Michelle Barnett    ZOX:[id number]  DOA: 02/01/2024     Date of Service: the patient was seen and examined on 02/25/2024  Chief Complaint  Patient presents with   Respiratory Distress   Brief hospital course: 63 y.o. female with hypertension, hyperlipidemia, iron deficiency anemia, who presented to the ED on 02/01/2024 with acute hypoxia. Pt is currently intubated, sedated, and unable to contribute to history and no family is currently available, therefore history was obtained per chart review.  patient was previously in her PCP office where she was noted to be 66% on room air, this improved on 3 L O2 but could not get higher than 88%.  EMS was called and brought to the patient to the hospital.  In the ED she was found to be septic with a temperature of 100.7, respirations 20, tachycardic to 113 and hypoxic.  Labs were mostly unrevealing.  Patient underwent CT head, CT cervical spine, CT maxillofacial which revealed soft tissue swelling of the periorbital soft tissues of the left and along the left frontal scalp.  She received DuoNebs, azithromycin, ceftriaxone.  Hospital stay has been prolonged by ongoing hypoxia, inability to wean oxygen, and persistent delirium.  Patient has now completed her course of antibiotics.  We have initiated steroid taper, but she remains on 6 L nasal cannula.   Patient's stay was also complicated by A-fib RVR necessitating cardiology consult, amiodarone drip and heparin drip which have since been transitioned to Eliquis and amiodarone p.o.  A-fib RVR likely provoked by hypoxia and breathing treatments. Patient stay is also been complicated by lip lesions which were initially thought to be impetigo but later resulted positive for HSV.  She has had some improvement with mupirocin and was initiated on valacyclovir."   2/4: Admitted by [facility]. 2/12: Rapid response called due to witnessed Generalized Tonic Clonic seizure, self resolved.   Never lost pulse, however severely hypoxic, required emergent intubation and mechanical ventilation for airway protection. 2/13: No significant events noted overnight.  MRI Brain last night concerning for PRESS.  Lab unable to obtain blood draws, will place central line for access.  On minimal vent support, requiring minimal Levophed.  Will perform WUQ and SBT as able. 2/14: No events noted overnight.  Plan for LP today at bedside.  On minimal vent support, will plan for WUA/SBT following LP 2/15: Pt extubated to 3L O2 via nasal canula.  Pending speech/PT/OT    2/16 patient was transferred to [facility] service.  Please review previous notes for details, and further management as below.     Assessment and Plan:   Principal Problem:   Pneumonia of right lower lobe due to infectious organism Active Problems:   AKI (acute kidney injury) (HCC)   Essential hypertension   Vitamin B12 deficiency   Adjustment disorder with mixed anxiety and depressed mood   Acute hypoxic respiratory failure (HCC)   GERD (gastroesophageal reflux disease)   Mild intermittent asthma without complication   Hyperlipidemia   Hypoxia   Ecchymosis of left eye   Sepsis (HCC)   Paroxysmal atrial fibrillation (HCC)   Atrial fibrillation with RVR (HCC)   Hiatal hernia   Pneumonia   HSV-1 infection   Acute encephalopathy   # Generalized tonic clonic seizure~resolved # Acute metabolic encephalopathy~resolved  # Concern for PRESS MRI Brain consistent with PRESS - Meningitis/encephalitis panel 2/14: negative  - Neurology consulted: - D/c acyclovir, less likely herpes encephalitis as per ID - Mental status  at/near baseline Plan: Continue Keppra 500 twice daily Continue Eliquis 5 g twice daily Daily PT OT, ambulation as tolerated Continue dysphagia 1 diet Seizure precautions  Medically appropriate for SNF   #Acute hypoxic respiratory failure in the setting of CAP, pulmonary edema, and asthma exacerbation - Supplemental  O2 for dyspnea and/or hypoxia  - Maintain O2 sats 92% or higher  - Prn bronchodilator therapy  -Continue p.o. prednisone, taper slowly 2/17 Mucinex 600 mg p.o. twice daily, Tussionex prn for cough 2/19 persistent hypoxia, continue supplemental admission, ABG reviewed.   2/19 CXR: Interval extubation. Persistent left retrocardiac consolidation/atelectasis. Slightly increased bibasilar opacities. 2/23: Repeat CXR due to worsening SOB.  Persistent infiltrate noted Plan: Lasix prn Wean oxygen as tolerated Currently on 3L, may need long term weaning efforts at [facility]   # Atrial fibrillation with rvr~ currently in NSR # HTN  # Hx: CAD and bicuspid aortic valve  # Valvular abnormality, mitral, tricuspid and aortic valves as below. Plan: Metoprolol  50 BID Losartan 50 every day Amiodarone 200 daily x 1 month, then stop, till 03/20/2024   # HSV-1 Infection~improved # CAP~treated  - Trend WBC and monitor fever curve  - Follow culture results  S/p acyclovir, CSF negative so it has been discontinued. S/p Valtrex 1000 mg twice daily for 2 days, to complete 7-day course, completed Plan: Completed treatement     # Acute kidney Injury~resolved  # Metabolic alkalosis, suspect contraction in setting of diuresis~improving  - Trend BMP  - Strict I&O's - Avoid nephrotoxic agents as able - Replace electrolytes as indicated~pharmacy following for assistance with electrolyte replacement   # Anemia without obvious signs of bleeding  - Trend CBC  - Monitor for s/sx of bleeding  - Transfuse for hgb <7 Anemia workup within normal range Suspect chronic inflammation, no indication for transfusion   Possible sundowning and hospital delirium Patient was on Seroquel 25 mg nightly, increased on 2/26 to Seroquel 50 mg nightly   Body mass index is 26.83 kg/m.  Nutrition Problem: Inadequate oral intake Etiology: inability to eat (pt sedated and ventilated) Interventions: Interventions: Ensure Enlive  (each supplement provides 350kcal and 20 grams of protein), MVI, Magic cup   Diet: Dysphagia 1 diet DVT Prophylaxis: Eliquis  Advance goals of care discussion: Full code  Family Communication: family was present at bedside, at the time of interview.  The pt provided permission to discuss medical plan with the family. Opportunity was given to ask question and all questions were answered satisfactorily.   Disposition:  Pt is from Home, admitted with AMS, seizures, HSV-1, HTN, gradually improved and clinically stable to discharge. Discharge to SNF, when bed will be available. 2/27 insurance Auth pending as per TOC   Subjective: No significant events overnight, still has mild shortness of breath and productive cough.  Overall feeling better.  Patient is intermittently confused, as per her partner at bedside that she tries to get out of the bed when he is not there in the room.   Physical Exam: General: NAD, lying comfortably Appear in no distress, affect appropriate Eyes: PERRLA ENT: Oral Mucosa Clear, moist  Neck: no JVD,  Cardiovascular: S1 and S2 Present, no Murmur,  Respiratory: good respiratory effort, Bilateral Air entry equal and Decreased, no Crackles, no wheezes Abdomen: Bowel Sound present, Soft and no tenderness,  Skin: no rashes Extremities: no Pedal edema, no calf tenderness Neurologic: without any new focal findings Gait not checked due to patient safety concerns  Vitals:   02/24/24 1807  02/24/24 2152 02/25/24 0500 02/25/24 0700  BP: 134/72 123/65  (!) 149/83  Pulse: (!) 58 64  65  Resp: 17 16  16   Temp: 99.3 F (37.4 C) 97.8 F (36.6 C)  98.1 F (36.7 C)  TempSrc:  Oral  Oral  SpO2: 93% 95%  97%  Weight:   56.2 kg   Height:        Intake/Output Summary (Last 24 hours) at 02/25/2024 1404 Last data filed at 02/25/2024 1045 Gross per 24 hour  Intake 600 ml  Output --  Net 600 ml   Filed Weights   02/23/24 0151 02/24/24 0406 02/25/24 0500  Weight: 56.4  kg 56.4 kg 56.2 kg    Data Reviewed: I have personally reviewed and interpreted daily labs, tele strips, imagings as discussed above. I reviewed all nursing notes, pharmacy notes, vitals, pertinent old records I have discussed plan of care as described above with RN and patient/family.  CBC: Recent Labs  Lab 02/19/24 0414 02/22/24 0922 02/24/24 0437  WBC 7.1 10.4 7.4  NEUTROABS  --  8.6*  --   HGB 9.4* 10.4* 9.7*  HCT 30.4* 33.4* 30.4*  MCV 101.0* 100.0 101.0*  PLT 182 197 134*   Basic Metabolic Panel: Recent Labs  Lab 02/19/24 0414 02/22/24 0922 02/24/24 0437  NA 144 142 138  K 4.0 3.3* 3.8  CL 101 93* 95*  CO2 36* 39* 34*  GLUCOSE 91 107* 91  BUN 29* 39* 37*  CREATININE 0.78 0.86 0.89  CALCIUM 8.6* 8.9 8.6*  MG 2.0  --  2.3  PHOS 3.6  --  3.3    Studies: No results found.  Scheduled Meds:  amiodarone  200 mg Oral Daily   apixaban  5 mg Oral BID   atorvastatin  40 mg Oral QHS   Chlorhexidine Gluconate Cloth  6 each Topical Daily   cyanocobalamin  1,000 mcg Oral Daily   ezetimibe  10 mg Oral Daily   feeding supplement  237 mL Oral TID BM   guaiFENesin  1,200 mg Oral BID   levETIRAcetam  500 mg Oral BID   losartan  50 mg Oral Daily   metoprolol tartrate  50 mg Oral BID   multivitamin with minerals  1 tablet Oral Daily   mupirocin ointment   Nasal BID   mouth rinse  15 mL Mouth Rinse 4 times per day   pantoprazole  40 mg Oral Daily   polyethylene glycol  17 g Oral Daily   predniSONE  40 mg Oral Q breakfast   QUEtiapine  50 mg Oral QHS   sodium chloride flush  10-40 mL Intracatheter Q12H   thiamine  100 mg Oral Daily   Continuous Infusions: PRN Meds: acetaminophen, chlorpheniramine-HYDROcodone, hydrALAZINE, HYDROcodone-acetaminophen, ipratropium-albuterol, mouth rinse, mouth rinse, mouth rinse, mouth rinse, senna-docusate, sodium chloride flush  Time spent: 35 minutes  Author: Gillis Santa. MD [facility] 02/25/2024 2:04 PM  To reach On-call,  see care teams to locate the attending and reach out to them via [email or web address]. If 7PM-7AM, please contact night-coverage If you still have difficulty reaching the attending provider, please page the [facility] (Director on Call) for [facility] on amion for assistance.

## 2024-02-25 NOTE — Plan of Care (Signed)
  Problem: Education: Goal: Knowledge of General Education information will improve Description: Including pain rating scale, medication(s)/side effects and non-pharmacologic comfort measures Outcome: Progressing   Problem: Health Behavior/Discharge Planning: Goal: Ability to manage health-related needs will improve Outcome: Progressing   Problem: Clinical Measurements: Goal: Ability to maintain clinical measurements within normal limits will improve Outcome: Progressing Goal: Will remain free from infection Outcome: Progressing Goal: Diagnostic test results will improve Outcome: Progressing Goal: Respiratory complications will improve Outcome: Progressing Goal: Cardiovascular complication will be avoided Outcome: Progressing   Problem: Activity: Goal: Risk for activity intolerance will decrease Outcome: Progressing   Problem: Nutrition: Goal: Adequate nutrition will be maintained Outcome: Progressing   Problem: Coping: Goal: Level of anxiety will decrease Outcome: Progressing   Problem: Elimination: Goal: Will not experience complications related to bowel motility Outcome: Progressing Goal: Will not experience complications related to urinary retention Outcome: Progressing   Problem: Pain Managment: Goal: General experience of comfort will improve and/or be controlled Outcome: Progressing   Problem: Safety: Goal: Ability to remain free from injury will improve Outcome: Progressing   Problem: Skin Integrity: Goal: Risk for impaired skin integrity will decrease Outcome: Progressing   Problem: Activity: Goal: Ability to tolerate increased activity will improve Outcome: Progressing   Problem: Clinical Measurements: Goal: Ability to maintain a body temperature in the normal range will improve Outcome: Progressing   Problem: Respiratory: Goal: Ability to maintain adequate ventilation will improve Outcome: Progressing Goal: Ability to maintain a clear airway  will improve Outcome: Progressing   Problem: Activity: Goal: Ability to tolerate increased activity will improve Outcome: Progressing   Problem: Respiratory: Goal: Ability to maintain a clear airway and adequate ventilation will improve Outcome: Progressing   Problem: Role Relationship: Goal: Method of communication will improve Outcome: Progressing

## 2024-02-25 NOTE — Progress Notes (Signed)
 Physical Therapy Treatment Patient Details Name: Michelle Barnett MRN: [id number] DOB: Mar 15, 1961 Today's Date: 02/25/2024   History of Present Illness Pt is a 63 y.o. female admitted following a fall at home with acute metabolic encephalopathy, acute hypoxic respiratory failure in the setting of CAP and acute asthma exacerbation, A.fib with RVR, AKI, anemia without obvious signs of bleeding, and HSV 1 infection. Course complicated on 02/09/24 by generalized tonic clonic seizure with severe hypoxia requiring intubation and mechanical ventilation for airway protection. Pt extubated 02/12/24.    PT Comments  Pt was pleasant and motivated to participate during the session and put forth good effort throughout. Pt required significantly increased time and effort going from sup to sit but no physical assist. Pt required multi-modal cuing and min A to come to standing from the EOB but only cuing to stand from the recliner.  Pt was able to amb 30 feet x 1 with seated rest break between walks with very slow, cautious cadence and short B step length but no overt LOB noted. Pt reported no adverse symptoms during the session.  Pt will benefit from continued PT services upon discharge to safely address deficits listed in patient problem list for decreased caregiver assistance and eventual return to PLOF.         If plan is discharge home, recommend the following: A lot of help with walking and/or transfers;A lot of help with bathing/dressing/bathroom;Assistance with cooking/housework;Direct supervision/assist for medications management;Direct supervision/assist for financial management;Assist for transportation;Help with stairs or ramp for entrance;Supervision due to cognitive status   Can travel by private vehicle     No  Equipment Recommendations  Other (comment) (TBD)    Recommendations for Other Services       Precautions / Restrictions Precautions Precautions: Fall Recall of  Precautions/Restrictions: Impaired Restrictions Weight Bearing Restrictions Per Provider Order: No     Mobility  Bed Mobility Overal bed mobility: Needs Assistance Bed Mobility: Supine to Sit     Supine to sit: Supervision     General bed mobility comments: significant extra time and effort to come to standing but did not need direct assist    Transfers Overall transfer level: Needs assistance Equipment used: Rolling walker (2 wheels) Transfers: Sit to/from Stand Sit to Stand: Min assist, Contact guard assist           General transfer comment: Pt required multi-modal cuing for sequencing most notably for hand placement and increased trunk flexion.  Min A to come to standing from the EOB and CGA from the recliner    Ambulation/Gait Ambulation/Gait assistance: Contact guard assist Gait Distance (Feet): 30 Feet x 2 Assistive device: Rolling walker (2 wheels) Gait Pattern/deviations: Step-through pattern, Trunk flexed, Decreased step length - right, Decreased step length - left Gait velocity: decreased     General Gait Details: Very slow cadence with short B step length but generally steady without LOB   Stairs             Wheelchair Mobility     Tilt Bed    Modified Rankin (Stroke Patients Only)       Balance Overall balance assessment: Needs assistance Sitting-balance support: Bilateral upper extremity supported, Feet supported Sitting balance-Leahy Scale: Fair     Standing balance support: Bilateral upper extremity supported, During functional activity, Reliant on assistive device for balance Standing balance-Leahy Scale: Fair  Communication Communication Communication: No apparent difficulties  Cognition Arousal: Alert Behavior During Therapy: WFL for tasks assessed/performed   PT - Cognitive impairments: Awareness, Safety/Judgement                           Following commands impaired:  Follows one step commands with increased time    Cueing Cueing Techniques: Verbal cues, Tactile cues, Gestural cues, Visual cues  Exercises Other Exercises Other Exercises: Pt and caregiver education on transfer technique from various height surfaces    General Comments        Pertinent Vitals/Pain Pain Assessment Pain Assessment: No/denies pain    Home Living                          Prior Function            PT Goals (current goals can now be found in the care plan section) Progress towards PT goals: Progressing toward goals    Frequency    Min 1X/week      PT Plan      Co-evaluation              AM-PAC PT "6 Clicks" Mobility   Outcome Measure  Help needed turning from your back to your side while in a flat bed without using bedrails?: A Little Help needed moving from lying on your back to sitting on the side of a flat bed without using bedrails?: A Little Help needed moving to and from a bed to a chair (including a wheelchair)?: A Little Help needed standing up from a chair using your arms (e.g., wheelchair or bedside chair)?: A Little Help needed to walk in hospital room?: A Little Help needed climbing 3-5 steps with a railing? : A Lot 6 Click Score: 17    End of Session Equipment Utilized During Treatment: Oxygen;Gait belt Activity Tolerance: Patient tolerated treatment well Patient left: in chair;with call bell/phone within reach;with chair alarm set;with family/visitor present Nurse Communication: Mobility status PT Visit Diagnosis: Muscle weakness (generalized) (M62.81);Difficulty in walking, not elsewhere classified (R26.2);Unsteadiness on feet (R26.81)     Time: 1417-1440 PT Time Calculation (min) (ACUTE ONLY): 23 min  Charges:    $Gait Training: 8-22 mins $Therapeutic Activity: 8-22 mins PT General Charges $$ ACUTE PT VISIT: 1 Visit                     D. Scott Larin Depaoli PT, DPT 02/25/24, 3:30 PM

## 2024-02-25 NOTE — TOC Transition Note (Signed)
 Transition of Care [facility]) - Discharge Note   Patient Details  Name: Michelle Barnett MRN: [id number] Date of Birth: 03-07-1961  [facility] CM/SW Contact:  Hetty Ely, RN Phone Number: 02/25/2024, 4:29 PM   Clinical Narrative: To discharge to [facility] room 710, nurse to call report to [phone]. CM attempted to notify family, friend number mailbox is full and primary number not in service.      Final next level of care: Skilled Nursing Facility Barriers to Discharge: Barriers Resolved   Patient Goals and CMS Choice            Discharge Placement              Patient chooses bed at: [facility] [place] Patient to be transferred to facility by: [facility] Name of family member notified: Phone numbers on file not in service Patient and family notified of of transfer: 02/25/24  Discharge Plan and Services Additional resources added to the After Visit Summary for       Post Acute Care Choice: Skilled Nursing Facility          DME Arranged: N/A DME Agency: NA       HH Arranged: NA HH Agency: NA        Social Drivers of Health (SDOH) Interventions SDOH Screenings   Food Insecurity: No Food Insecurity (02/03/2024)  Housing: Low Risk  (02/03/2024)  Transportation Needs: No Transportation Needs (02/03/2024)  Utilities: Not At Risk (02/03/2024)  Depression (PHQ2-9): Low Risk  (09/09/2023)  Social Connections: Moderately Integrated (02/03/2024)  Tobacco Use: Low Risk  (02/03/2024)     Readmission Risk Interventions     No data to display

## 2024-02-25 NOTE — Progress Notes (Addendum)
 Occupational Therapy Treatment Patient Details Name: Michelle Barnett MRN: [id number] DOB: 08/05/1961 Today's Date: 02/25/2024   History of present illness Pt is a 63 y.o. female admitted following a fall at home with acute metabolic encephalopathy, acute hypoxic respiratory failure in the setting of CAP and acute asthma exacerbation, A.fib with RVR, AKI, anemia without obvious signs of bleeding, and HSV 1 infection. Course complicated on 02/09/24 by generalized tonic clonic seizure with severe hypoxia requiring intubation and mechanical ventilation for airway protection. Pt extubated 02/12/24.   OT comments  Pt received supine in bed, agreeable to session. Supervision for bed mobility, improved transition ability from previous sessions but still requires excessive time with pt often internally + externally distracted. Functional mobility in room using RW, minA initially fading to CGA for safety to stand at sink for grooming tasks. Pt able to comb front of hair with unilateral support on countertop; maxA from this author to comb out matted hair. Pt returns to supine, needs within reach, and alarm activated. Discharge recommendation remains appropriate, Patient will benefit from continued inpatient follow up therapy, <3 hours/day       If plan is discharge home, recommend the following:  Two people to help with walking and/or transfers;Two people to help with bathing/dressing/bathroom;Assist for transportation;Help with stairs or ramp for entrance;Direct supervision/assist for financial management;Supervision due to cognitive status;Direct supervision/assist for medications management;Assistance with cooking/housework   Equipment Recommendations  Other (comment)       Precautions / Restrictions Precautions Precautions: Fall Recall of Precautions/Restrictions: Impaired Restrictions Weight Bearing Restrictions Per Provider Order: No       Mobility Bed Mobility Overal bed mobility: Needs  Assistance Bed Mobility: Supine to Sit     Supine to sit: Supervision     General bed mobility comments: extra time and effort throughout    Transfers Overall transfer level: Needs assistance Equipment used: Rolling walker (2 wheels) Transfers: Sit to/from Stand Sit to Stand: Min assist, Contact guard assist           General transfer comment: Multimodal cuing for sequencing and hand placement, minA for first standing attempt but CGA for second trial     Balance Overall balance assessment: Needs assistance Sitting-balance support: Bilateral upper extremity supported, Feet supported Sitting balance-Leahy Scale: Fair     Standing balance support: Single extremity supported, During functional activity Standing balance-Leahy Scale: Fair Standing balance comment: very high falls risk                           ADL either performed or assessed with clinical judgement   ADL Overall ADL's : Needs assistance/impaired     Grooming: Wash/dry hands;Standing;Brushing hair;Maximal assistance Grooming Details (indicate cue type and reason): Pt able to brush hair with CGA for safety during standing balance. Requires maxA for this author to comb mats out of patient's hair, ~5 min standing tolerated                             Functional mobility during ADLs: Minimal assistance;Rolling walker (2 wheels) General ADL Comments: Pt completes ~20 ft mobility in room using RW and minA-CGA for safety and navigation.     Communication Communication Communication: No apparent difficulties   Cognition Arousal: Alert Behavior During Therapy: WFL for tasks assessed/performed Cognition: Cognition impaired   Orientation impairments: Time, Situation Awareness: Intellectual awareness impaired, Online awareness impaired Memory impairment (select all impairments): Short-term memory,  Working memory Attention impairment (select first level of impairment): Sustained  attention Executive functioning impairment (select all impairments): Problem solving, Sequencing, Organization, Reasoning OT - Cognition Comments: oriented to self, remembers this Clinical research associate from last session, more appropriate conversation noted this date                 Following commands: Impaired Following commands impaired: Follows one step commands with increased time      Cueing   Cueing Techniques: Verbal cues, Tactile cues, Gestural cues, Visual cues        General Comments bruising on face    Pertinent Vitals/ Pain       Pain Assessment Pain Assessment: No/denies pain         Frequency  Min 1X/week        Progress Toward Goals  OT Goals(current goals can now be found in the care plan section)  Progress towards OT goals: Progressing toward goals  Acute Rehab OT Goals OT Goal Formulation: With patient Time For Goal Achievement: 02/28/24 Potential to Achieve Goals: Fair ADL Goals Pt Will Perform Grooming: with min assist;sitting Pt Will Perform Upper Body Bathing: sitting;with min assist;with mod assist Pt Will Transfer to Toilet: with max assist;with mod assist;bedside commode;stand pivot transfer  Plan         AM-PAC OT "6 Clicks" Daily Activity     Outcome Measure   Help from another person eating meals?: A Little Help from another person taking care of personal grooming?: A Lot Help from another person toileting, which includes using toliet, bedpan, or urinal?: A Lot Help from another person bathing (including washing, rinsing, drying)?: A Lot Help from another person to put on and taking off regular upper body clothing?: A Lot Help from another person to put on and taking off regular lower body clothing?: A Lot 6 Click Score: 13    End of Session Equipment Utilized During Treatment: Oxygen;Rolling walker (2 wheels)  OT Visit Diagnosis: Other abnormalities of gait and mobility (R26.89);Muscle weakness (generalized) (M62.81)   Activity Tolerance  Patient tolerated treatment well   Patient Left in bed;with call bell/phone within reach;with bed alarm set   Nurse Communication Mobility status        Time: 2956-2130 OT Time Calculation (min): 17 min  Charges: OT General Charges $OT Visit: 1 Visit OT Treatments $Self Care/Home Management : 8-22 mins  Darcelle Herrada L. Jovonda Selner, OTR/L  02/25/24, 4:22 PM

## 2024-02-25 NOTE — Discharge Summary (Signed)
 [facility] Discharge Summary   Patient: Michelle Barnett [id number]:[id number]  PCP: Doreene Nest, NP  Date of admission: 02/01/2024   Date of discharge:  02/25/2024     Discharge Diagnoses:  Principal Problem:   Pneumonia of right lower lobe due to infectious organism Active Problems:   AKI (acute kidney injury) (HCC)   Essential hypertension   Vitamin B12 deficiency   Adjustment disorder with mixed anxiety and depressed mood   Acute hypoxic respiratory failure (HCC)   GERD (gastroesophageal reflux disease)   Mild intermittent asthma without complication   Hyperlipidemia   Hypoxia   Ecchymosis of left eye   Sepsis (HCC)   Paroxysmal atrial fibrillation (HCC)   Atrial fibrillation with RVR (HCC)   Hiatal hernia   Pneumonia   HSV-1 infection   Acute encephalopathy   Admitted From: Home Disposition:  SNF   Recommendations for Outpatient Follow-up:  Follow-up with PCP, patient should be seen by an MD in 1 to 2 days, continue monitor BP and titrate medications accordingly. 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 Follow up LABS/TEST: Repeat CBC after 1 to 2 weeks, chest x-ray after 4 weeks   Contact information for after-discharge care     Destination     [facility] Preferred SNF .   Service: Skilled Nursing Contact information: [address] [place] [address] [phone]                    Diet recommendation: Dysphagia type 1 thin Liquid  Activity: The patient is advised to gradually reintroduce usual activities, as tolerated  Discharge Condition: stable  Code Status: Full code   History of present illness: As per the H and P dictated on admission Hospital Course:   63 y.o. female with hypertension, hyperlipidemia, iron deficiency anemia, who presented to the ED on 02/01/2024 with acute hypoxia. Pt is  currently intubated, sedated, and unable to contribute to history and no family is currently available, therefore history was obtained per chart review.  patient was previously in her PCP office where she was noted to be 66% on room air, this improved on 3 L O2 but could not get higher than 88%.  EMS was called and brought to the patient to the hospital.  In the ED she was found to be septic with a temperature of 100.7, respirations 20, tachycardic to 113 and hypoxic.  Labs were mostly unrevealing.  Patient underwent CT head, CT cervical spine, CT maxillofacial which revealed soft tissue swelling of the periorbital soft tissues of the left and along the left frontal scalp.  She received DuoNebs, azithromycin, ceftriaxone.  Hospital stay has been prolonged by ongoing hypoxia, inability to wean oxygen, and persistent delirium.  Patient has now completed her course of antibiotics.  We have initiated steroid taper, but she remains on 6 L nasal cannula.   Patient's stay was also complicated by A-fib RVR necessitating cardiology consult, amiodarone drip and heparin drip which have since been transitioned to Eliquis and amiodarone p.o.  A-fib RVR likely provoked by hypoxia and breathing treatments. Patient stay is also been complicated by lip lesions which were initially thought to be impetigo but later resulted positive for HSV.  She has had some improvement with mupirocin and was initiated on valacyclovir."   2/4: Admitted by [facility]. 2/12: Rapid response called due to witnessed Generalized Tonic  Clonic seizure, self resolved.  Never lost pulse, however severely hypoxic, required emergent intubation and mechanical ventilation for airway protection. 2/13: No significant events noted overnight.  MRI Brain last night concerning for PRESS.  Lab unable to obtain blood draws, will place central line for access.  On minimal vent support, requiring minimal Levophed.  Will perform WUQ and SBT as able. 2/14: No events noted  overnight.  Plan for LP today at bedside.  On minimal vent support, will plan for WUA/SBT following LP 2/15: Pt extubated to 3L O2 via nasal canula.  Pending speech/PT/OT    2/16 patient was transferred to [facility] service.  Please review previous notes for details, and further management as below.  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 Continue Keppra 500 twice daily Continue Eliquis 5 g twice daily. Daily PT OT, ambulation as tolerated. Continue dysphagia 1 diet Seizure precautions. Medically appropriate for SNF   #Acute hypoxic respiratory failure in the setting of CAP, pulmonary edema, and asthma exacerbation. Supplemental O2 for dyspnea and/or hypoxia. Maintain O2 sats 92% or higher  Continue inhalers. p.o. prednisone, taper slowly. Mucinex 600 mg p.o. twice daily. 2/19 CXR: Interval extubation. Persistent left retrocardiac consolidation/atelectasis. Slightly increased bibasilar opacities. 2/23: Repeat CXR due to worsening SOB.  Persistent infiltrate noted Wean oxygen as tolerated   # Atrial fibrillation with rvr~ currently in NSR # HTN and Hx: CAD and bicuspid aortic valve  # Valvular abnormality, mitral, tricuspid and aortic valves as below. Continue metoprolol  50 BID and Losartan 50 mh po daily. Amiodarone 200 daily x 1 month, then stop, till 03/20/2024.  Continue Eliquis 5 mg p.o. twice daily.  Follow-up with cardiology in 1 to 2 weeks as an outpatient # HSV-1 Infection~improved # CAP~treated Bld culture NGTD  S/p acyclovir, CSF negative so it has been discontinued. S/p Valtrex 1000 mg twice daily for 2 days, to complete 7-day course, completed # Acute kidney Injury~resolved  # Metabolic alkalosis,  suspect contraction in setting of diuresis~improving  Electrolytes replaced, pharmacy was consulted.  Repeat BMP after 1 week # Anemia without obvious signs of bleeding: Anemia workup within normal range Suspect chronic inflammation, no indication for transfusion. Hb 9.7 today stable. # Possible sundowning and hospital delirium Patient was on Seroquel 25 mg nightly, increased on 2/26 to Seroquel 50 mg nightly prn  Body mass index is 26.83 kg/m.  Nutrition Problem: Inadequate oral intake Etiology: inability to eat (pt sedated and ventilated) Nutrition Interventions: Interventions: Ensure Enlive (each supplement provides 350kcal and 20 grams of protein), MVI, Magic cup  - Patient was instructed, not to drive, operate heavy machinery, perform activities at heights, swimming or participation in water activities or provide baby sitting services while on Pain, Sleep and Anxiety Medications; until her outpatient Physician has advised to do so again.  - Also recommended to not to take more than prescribed Pain, Sleep and Anxiety Medications.  Patient was seen by physical therapy, who recommended Therapy, SNF placement, which was arranged. On the day of the discharge the patient's vitals were stable, and no other acute medical condition were reported by patient. the patient was felt safe to be discharge at SNF with Therapy.  Consultants: Pulmonary critical care, ID, neurology Procedures: s/p intubation and lumbar puncture  Discharge Exam: General: Appear  in no distress, no Rash; Oral Mucosa Clear, moist. Cardiovascular: S1 and S2 Present, no Murmur, Respiratory: Mild respiratory distress, on supplemental O2 inhalation, mild crackles, no wheezes Abdomen: Bowel Sound present, Soft and no tenderness, no hernia Extremities: no Pedal edema, no calf tenderness Neurology: AAOx 2-3, intermittently confused. affect appropriate.  Filed Weights   02/23/24 0151 02/24/24 0406 02/25/24 0500  Weight: 56.4  kg 56.4 kg 56.2 kg   Vitals:   02/24/24 2152 02/25/24 0700  BP: 123/65 (!) 149/83  Pulse: 64 65  Resp: 16 16  Temp: 97.8 F (36.6 C) 98.1 F (36.7 C)  SpO2: 95% 97%    DISCHARGE MEDICATION: Allergies as of 02/25/2024       Reactions   Amlodipine Rash   Aspirin Anaphylaxis   Dairy Aid [tilactase] Swelling, Other (See Comments)   Any dairy products   Benadryl [diphenhydramine] Palpitations   Penicillins Other (See Comments)   Reaction: Unknown        Medication List     STOP taking these medications    lisinopril-hydrochlorothiazide 20-25 MG tablet Commonly known as: ZESTORETIC       TAKE these medications    albuterol 108 (90 Base) MCG/ACT inhaler Commonly known as: VENTOLIN HFA INHALE 2 PUFFS BY MOUTH EVERY 6 HOURS AS NEEDED FOR WHEEZING FOR SHORTNESS OF BREATH   amiodarone 200 MG tablet Commonly known as: PACERONE Take 1 tablet (200 mg total) by mouth daily for 23 days. Start taking on: February 26, 2024   apixaban 5 MG Tabs tablet Commonly known as: ELIQUIS Take 1 tablet (5 mg total) by mouth 2 (two) times daily.   Arnuity Ellipta 100 MCG/ACT Aepb Generic drug: Fluticasone Furoate Inhale 1 puff into the lungs daily.   atorvastatin 40 MG tablet Commonly known as: LIPITOR TAKE 1 TABLET BY MOUTH ONCE DAILY FOR CHOLESTEROL   benzonatate 200 MG capsule Commonly known as: TESSALON Take 1 capsule (200 mg total) by mouth 3 (three) times daily as needed for cough.   cyanocobalamin 1000 MCG tablet Commonly known as: VITAMIN B12 Take 1,000 mcg by mouth daily.   ezetimibe 10 MG tablet Commonly known as: ZETIA Take 1 tablet (10 mg total) by mouth daily. Start taking on: February 26, 2024   ferrous sulfate 325 (65 FE) MG tablet Take 650 mg by mouth 2 (two) times daily.   Fish Oil 1200 MG Caps Take 1 capsule by mouth daily.   guaiFENesin 600 MG 12 hr tablet Commonly known as: MUCINEX Take 1 tablet (600 mg total) by mouth 2 (two) times daily for 14 days.    levETIRAcetam 500 MG tablet Commonly known as: KEPPRA Take 1 tablet (500 mg total) by mouth 2 (two) times daily.   losartan 50 MG tablet Commonly known as: COZAAR Take 1 tablet (50 mg total) by mouth daily. Start taking on: February 26, 2024   magnesium oxide 400 MG tablet Commonly known as: MAG-OX Take 400 mg by mouth daily.   metoprolol tartrate 50 MG tablet Commonly known as: LOPRESSOR Take 1 tablet (50 mg total) by mouth 2 (two) times daily.   ONE-A-DAY WOMENS PO Take 1 tablet by mouth daily.   pantoprazole 40 MG tablet Commonly known as: PROTONIX Take 1 tablet (40 mg total) by mouth daily for 14 days. Start taking on: February 26, 2024   polyethylene glycol 17 g packet Commonly known as: MIRALAX / GLYCOLAX Take 17 g by mouth daily. Start taking on: February 26, 2024   predniSONE 20 MG tablet  Commonly known as: DELTASONE Take 1.5 tablets (30 mg total) by mouth daily with breakfast for 3 days, THEN 1 tablet (20 mg total) daily with breakfast for 3 days, THEN 0.5 tablets (10 mg total) daily with breakfast for 3 days. Start taking on: February 26, 2024   QUEtiapine 50 MG tablet Commonly known as: SEROQUEL Take 1 tablet (50 mg total) by mouth at bedtime as needed (For sleep).   senna-docusate 8.6-50 MG tablet Commonly known as: Senokot-S Take 1 tablet by mouth at bedtime as needed for mild constipation.   vitamin C 1000 MG tablet Take 1,000 mg by mouth 2 (two) times daily.       Allergies  Allergen Reactions   Amlodipine Rash   Aspirin Anaphylaxis   Dairy Aid [Tilactase] Swelling and Other (See Comments)    Any dairy products   Benadryl [Diphenhydramine] Palpitations   Penicillins Other (See Comments)    Reaction: Unknown   Discharge Instructions     Call MD for:  difficulty breathing, headache or visual disturbances   Complete by: As directed    Call MD for:  extreme fatigue   Complete by: As directed    Call MD for:  persistant dizziness or light-headedness    Complete by: As directed    Call MD for:  persistant nausea and vomiting   Complete by: As directed    Call MD for:  severe uncontrolled pain   Complete by: As directed    Call MD for:  temperature >100.4   Complete by: As directed    Diet - low sodium heart healthy   Complete by: As directed    Discharge instructions   Complete by: As directed    Follow-up with PCP, patient should be seen by an MD in 1 to 2 days, continue monitor BP and titrate medications accordingly. 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   Increase activity slowly   Complete by: As directed        The results of significant diagnostics from this hospitalization (including imaging, microbiology, ancillary and laboratory) are listed below for reference.    Significant Diagnostic Studies: DG Chest Port 1 View Result Date: 02/20/2024 CLINICAL DATA:  Hypoxia EXAM: PORTABLE CHEST 1 VIEW COMPARISON:  02/16/2024, CT 02/03/2019 FINDINGS: Stable large cardiac silhouette. Elevation of LEFT hemidiaphragm. Dense LEFT lower lobe airspace disease persists. LEFT effusion persists. Small RIGHT effusion. Mild RIGHT airspace disease. IMPRESSION: 1. Minimal change from 02/16/2024. 2. Bibasilar pneumonia and effusions. Electronically Signed   By: Genevive Bi M.D.   On: 02/20/2024 13:14   DG Chest Port 1 View Result Date: 02/16/2024 CLINICAL DATA:  Pneumonia EXAM: PORTABLE CHEST - 1 VIEW COMPARISON:  02/10/2024 FINDINGS: Interval extubation. Somewhat lower lung volumes. Persistent left retrocardiac consolidation/atelectasis. Patchy opacities in both lung bases have slightly increased. Elevated left hemidiaphragm as before. Heart size and mediastinal contours are within normal limits. Suspect small left pleural effusion. Visualized bones unremarkable. IMPRESSION: 1. Interval extubation. 2. Persistent left retrocardiac  consolidation/atelectasis. 3. Slightly increased bibasilar opacities. Electronically Signed   By: Corlis Leak M.D.   On: 02/16/2024 09:11   DG Chest Port 1 View Result Date: 02/10/2024 CLINICAL DATA:  Central line placement attempt EXAM: PORTABLE CHEST 1 VIEW COMPARISON:  Same-day x-ray FINDINGS: Endotracheal tube terminates 2.4 cm above the carina. No central venous lines are seen within the field of view. Overlying cardiac leads.  Stable heart size. Bilateral layering pleural effusions with hazy bibasilar opacities. No pneumothorax. IMPRESSION: 1. Endotracheal tube terminates 2.4 cm above the carina. 2. No central venous lines are seen within the field of view. No pneumothorax. 3. Bilateral layering pleural effusions with hazy bibasilar opacities. Electronically Signed   By: Duanne Guess D.O.   On: 02/10/2024 15:19   DG Chest Port 1 View Result Date: 02/10/2024 CLINICAL DATA:  Acute respiratory failure with hypoxia. EXAM: PORTABLE CHEST 1 VIEW COMPARISON:  Chest x-ray from yesterday. FINDINGS: Unchanged endotracheal tube with the tip 1.7 cm above the carina. Interval removal of the enteric tube. Stable cardiomediastinal silhouette. Right lower lobe consolidation seems mildly improved. Unchanged left lower lobe consolidation and small bilateral pleural effusions. No pneumothorax. No acute osseous abnormality. IMPRESSION: 1. Multifocal pneumonia, mildly improved in the right lower lobe. 2. Unchanged small bilateral pleural effusions. Electronically Signed   By: Obie Dredge M.D.   On: 02/10/2024 09:54   MR BRAIN W WO CONTRAST Result Date: 02/09/2024 CLINICAL DATA:  Mental status change of unknown cause EXAM: MRI HEAD WITHOUT AND WITH CONTRAST TECHNIQUE: Multiplanar, multiecho pulse sequences of the brain and surrounding structures were obtained without and with intravenous contrast. CONTRAST:  6mL GADAVIST GADOBUTROL 1 MMOL/ML IV SOLN COMPARISON:  Head CT same day FINDINGS: Brain: Diffusion imaging  does not show any acute or subacute infarction or other cause of restricted diffusion. Patchy areas of abnormal edema seen in both cerebellar hemispheres. Subcortical edema present within both cerebral hemispheres, particularly posterior. 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 Numerous foci of hemosiderin deposition scattered throughout the brain, some present in areas not affected by the abnormal T2 signal. Therefore, I think these are probably chronic and not sequela of the recent head injury. There is probably a background pattern of mild chronic small-vessel ischemic change of the white matter. No sign of mass, hydrocephalus or extra-axial collection. There is brain atrophy, with some parietal predominance. Vascular: Major vessels at the base of the brain show flow. Skull and upper cervical spine: Negative Sinuses/Orbits: Sinuses are clear. Patient is intubated. Orbits negative. Other: Left forehead/frontal scalp hematoma. IMPRESSION: 1. Patchy areas of abnormal edema in both cerebellar hemispheres. Subcortical edema within both cerebral hemispheres, particularly posterior. 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 No areas show restricted diffusion to suggest ischemic infarction. 2. Numerous foci of hemosiderin deposition scattered throughout the brain, some present in areas not affected by the abnormal T2 signal. Therefore, I think these are probably chronic and not sequela of the recent head injury. 3. Brain atrophy, with some parietal predominance. 4. Left forehead/frontal scalp hematoma. Electronically Signed   By: Paulina Fusi M.D.   On: 02/09/2024 23:54   EEG adult Result Date: 02/09/2024 Jefferson Fuel, MD     02/09/2024  7:32 PM Routine EEG Report ALEKSANDRA RABEN is a 63 y.o. female with a history of seizure who is undergoing an EEG to evaluate for seizures. Report: This EEG was acquired with  electrodes placed according to the International 10-20 electrode system (including Fp1, Fp2, F3, F4, C3, C4, P3, P4, O1, O2, T3, T4, T5, T6, A1, A2, Fz, Cz, Pz). The following electrodes were missing or displaced: none. The background rhythm was burst suppression with approximately 35% suppression. The bursts consisted of theta activity ~6 Hz with overriding beta frequencies. There was no clear waking rhythm or sleep architecture. There were no definitive epileptiform abnormalities. There were no electrographic seizures.  Impression and clinical correlation: This EEG was obtained while sedated on propofol and comatose and is abnormal due to burst suppression pattern indicative of global cerebral dysfunction, medication effect, or both. Definitive epileptiform abnormalities were not seen during this recording. Bing Neighbors, MD [facility] [phone] If 7pm- 7am, please page neurology on call as listed in AMION.   DG Abd Portable 1V Result Date: 02/09/2024 CLINICAL DATA:  Orogastric tube placement. EXAM: PORTABLE ABDOMEN - 1 VIEW COMPARISON:  Chest CT 02/04/2024 FINDINGS: The enteric tube is looped within a large hiatal hernia. Tip is directed distally in the region of the diaphragmatic hiatus. IMPRESSION: Enteric tube looped within a large hiatal hernia. Tip is directed distally in the region of the diaphragmatic hiatus. Electronically Signed   By: Narda Rutherford M.D.   On: 02/09/2024 16:01   CT HEAD WO CONTRAST ( ) Result Date: 02/09/2024 CLINICAL DATA:  Provided history: Head trauma, abnormal mental status. EXAM: CT HEAD WITHOUT CONTRAST TECHNIQUE: Contiguous axial images were obtained from the base of the skull through the vertex without intravenous contrast. RADIATION DOSE REDUCTION: This exam was performed according to the departmental dose-optimization program which includes automated exposure control, adjustment of the mA and/or kV according to patient size and/or use of iterative  reconstruction technique. COMPARISON:  Head CT 02/01/2024. FINDINGS: Brain: Mild generalized parenchymal atrophy. Foci of abnormal cortical/subcortical hypodensity within the left parietal and bilateral occipital lobes, new from the prior head CT of 02/01/2024. There is no acute intracranial hemorrhage. No extra-axial fluid collection. No evidence of an intracranial mass. No midline shift. Vascular: No hyperdense vessel. Atherosclerotic calcifications. Skull: No calvarial fracture or aggressive osseous lesion. Sinuses/Orbits: No mass or acute finding within the imaged orbits. No significant paranasal sinus disease at the imaged levels. Other: Persistent left anterior scalp/forehead soft tissue swelling. Impression #1 will be called to the ordering clinician or representative by the Radiologist Assistant, and communication documented in the PACS or [employer]. IMPRESSION: 1. Foci of abnormal cortical/subcortical hypodensity within the left parietal and bilateral occipital lobes, new from the prior head CT of 02/01/2024. 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 A brain MRI is recommended for further evaluation. 2. Persistent left anterior scalp/forehead soft tissue swelling. 3. Mild generalized parenchymal atrophy. Electronically Signed   By: Jackey Loge D.O.   On: 02/09/2024 13:44   DG Chest Port 1 View Result Date: 02/09/2024 CLINICAL DATA:  Intubation. EXAM: PORTABLE CHEST 1 VIEW COMPARISON:  Chest radiograph dated 02/07/2024. FINDINGS: Endotracheal tube with tip approximately 1.5 cm above the carina. Recommend retraction by 3 cm. Enteric tube makes a loop in the large hiatal hernia with tip in the region of the gastroesophageal junction. No significant interval change in bilateral mid to lower lung field airspace opacities and small bilateral pleural effusions. No pneumothorax. Stable cardiac  silhouette no acute osseous pathology. IMPRESSION: 1. Endotracheal tube with tip approximately 1.5 cm above the carina. Recommend retraction by 3 cm. 2. Enteric tube makes a loop in the large hiatal hernia with tip in the region of the gastroesophageal junction. 3. No significant interval change in bilateral airspace opacities and small bilateral pleural effusions. Electronically Signed   By: Elgie Collard M.D.   On: 02/09/2024 12:19   DG Chest Port 1 View Result Date: 02/07/2024 CLINICAL DATA:  Hypoxia EXAM: PORTABLE CHEST 1 VIEW COMPARISON:  02/04/2024 FINDINGS: Single frontal view of the chest demonstrates a stable enlarged cardiac silhouette. Persistent veiling opacities  are seen at the lung bases, consistent with airspace disease and effusions as noted on prior CT. No pneumothorax. Hiatal hernia again noted. No acute bony abnormalities. IMPRESSION: 1. Stable bibasilar airspace disease and small bilateral pleural effusions. Electronically Signed   By: Sharlet Salina M.D.   On: 02/07/2024 14:59   CT Angio Chest Pulmonary Embolism (PE) W or WO Contrast Result Date: 02/04/2024 CLINICAL DATA:  Pulmonary embolism (PE) suspected, high prob. Acute hypoxia. EXAM: CT ANGIOGRAPHY CHEST WITH CONTRAST TECHNIQUE: Multidetector CT imaging of the chest was performed using the standard protocol during bolus administration of intravenous contrast. Multiplanar CT image reconstructions and MIPs were obtained to evaluate the vascular anatomy. RADIATION DOSE REDUCTION: This exam was performed according to the departmental dose-optimization program which includes automated exposure control, adjustment of the mA and/or kV according to patient size and/or use of iterative reconstruction technique. CONTRAST:  65mL OMNIPAQUE IOHEXOL 350 MG/ML SOLN COMPARISON:  10/21/2011 FINDINGS: Cardiovascular: No filling defects in the pulmonary arteries to suggest pulmonary emboli. Heart is normal size. Aorta is normal caliber. Scattered  coronary artery and aortic calcifications. Mediastinum/Nodes: Mildly prominent mediastinal lymph nodes. Right paratracheal lymph node has a short axis diameter of 1.3 cm. No axillary or hilar adenopathy. Trachea and esophagus are unremarkable. Thyroid unremarkable. Large hiatal hernia noted containing most of the stomach as well as transverse colon. Lungs/Pleura: Small bilateral pleural effusions. Consolidation in both lower lobes concerning for pneumonia. Upper Abdomen: No acute findings Musculoskeletal: Chest wall soft tissues are unremarkable. No acute bony abnormality. Review of the MIP images confirms the above findings. IMPRESSION: No evidence of pulmonary embolus. Dense consolidation throughout much of the lower lobes most compatible with pneumonia. Small bilateral pleural effusions. Large hiatal hernia containing much of the stomach and transverse colon. Mild mediastinal adenopathy, likely reactive. Aortic Atherosclerosis (ICD10-I70.0). Electronically Signed   By: Charlett Nose M.D.   On: 02/04/2024 18:17   DG Chest Port 1 View Result Date: 02/04/2024 CLINICAL DATA:  63 year old female with possible pleural effusions. EXAM: PORTABLE CHEST 1 VIEW COMPARISON:  Portable chest yesterday and earlier. FINDINGS: Portable AP upright view at 0706 hours. Chronic large gastric hiatal hernia partially containing gas redemonstrated. Stable mediastinal contours. Stable lung volumes with unchanged veiling lung base opacity, more conspicuous on the right. No pneumothorax. No upper lung pulmonary edema. No definite air bronchograms. Negative visible bowel gas in the abdomen. No acute osseous abnormality identified. IMPRESSION: Ventilation not significantly changed since 02/01/2024. Veiling lung base opacity superimposed on large gastric hiatal hernia. Differential considerations remain pneumonia, atelectasis, pleural effusions. Chest PA and lateral views would be helpful when feasible. Electronically Signed   By: Odessa Fleming  M.D.   On: 02/04/2024 07:22   DG Chest Port 1 View Result Date: 02/03/2024 CLINICAL DATA:  63 year old female with pneumonia, hypoxia. EXAM: PORTABLE CHEST 1 VIEW COMPARISON:  Cervical spine CT and portable chest x-ray yesterday. FINDINGS: Portable AP semi upright view at 0745 hours. Similar lung volumes. Ongoing veiling opacity at both lung bases, superimposed on large chronic gastric hiatal hernia which is felt to explain the bowel-gas lucency at the left lung base. Left hemidiaphragm is obscured. No superimposed pneumothorax. Upper lung pulmonary vascularity appears within normal limits. No air bronchograms. No acute osseous abnormality identified. Negative visible bowel gas *CRASH* that nonobstructed visible bowel gas. IMPRESSION: Stable ventilation from yesterday. Ongoing veiling opacity at both lung bases, superimposed on large chronic gastric hiatal hernia. Differential considerations include atelectasis, effusions, infection. Electronically Signed   By: Rexene Edison  Margo Aye M.D.   On: 02/03/2024 08:00   ECHOCARDIOGRAM COMPLETE Result Date: 02/02/2024    ECHOCARDIOGRAM REPORT   Patient Name:   ADELYNNE JOERGER Date of Exam: 02/02/2024 Medical Rec #:  [id number]        Height:       57.0 in Accession #:    [id number]       Weight:       127.0 lb Date of Birth:  1961-05-23        BSA:          1.483 m Patient Age:    62 years         BP:           136/92 mmHg Patient Gender: F                HR:           147 bpm. Exam Location:  [facility] Procedure: 2D Echo, Cardiac Doppler and Color Doppler Indications:     Abnormal ECG R94.31  History:         Patient has prior history of Echocardiogram examinations, most                  recent 05/27/2022.  Sonographer:     Cristela Blue Referring Phys:  [id number] Debarah Crape Diagnosing Phys: Julien Nordmann MD IMPRESSIONS  1. Left ventricular ejection fraction, by estimation, is 55 to 60%. Left ventricular ejection fraction by PLAX is 56 %. The left ventricle has normal function. The left  ventricle has no regional wall motion abnormalities. There is mild left ventricular hypertrophy. Left ventricular diastolic parameters were normal.  2. Right ventricular systolic function is normal. The right ventricular size is moderately enlarged. There is normal pulmonary artery systolic pressure. The estimated right ventricular systolic pressure is 30.0 mmHg.  3. The mitral valve is normal in structure. Moderate mitral valve regurgitation. No evidence of mitral stenosis.  4. Tricuspid valve regurgitation is moderate.  5. The aortic valve has an indeterminant number of cusps. There is moderate calcification of the aortic valve. Aortic valve regurgitation is not visualized. Moderate aortic valve stenosis. Aortic valve mean gradient measures 22.0 mmHg.  6. The inferior vena cava is normal in size with greater than 50% respiratory variability, suggesting right atrial pressure of 3 mmHg. FINDINGS  Left Ventricle: Left ventricular ejection fraction, by estimation, is 55 to 60%. Left ventricular ejection fraction by PLAX is 56 %. The left ventricle has normal function. The left ventricle has no regional wall motion abnormalities. The left ventricular internal cavity size was normal in size. There is mild left ventricular hypertrophy. Left ventricular diastolic parameters were normal. Right Ventricle: The right ventricular size is moderately enlarged. No increase in right ventricular wall thickness. Right ventricular systolic function is normal. There is normal pulmonary artery systolic pressure. The tricuspid regurgitant velocity is 2.50 m/s, and with an assumed right atrial pressure of 5 mmHg, the estimated right ventricular systolic pressure is 30.0 mmHg. Left Atrium: Left atrial size was normal in size. Right Atrium: Right atrial size was normal in size. Pericardium: There is no evidence of pericardial effusion. Mitral Valve: The mitral valve is normal in structure. Moderate mitral valve regurgitation. No evidence  of mitral valve stenosis. MV peak gradient, 7.2 mmHg. The mean mitral valve gradient is 3.0 mmHg. Tricuspid Valve: The tricuspid valve is normal in structure. Tricuspid valve regurgitation is moderate . No evidence of tricuspid stenosis. Aortic Valve: The aortic valve has an indeterminant  number of cusps. There is moderate calcification of the aortic valve. Aortic valve regurgitation is not visualized. Moderate aortic stenosis is present. Aortic valve mean gradient measures 22.0 mmHg. Aortic valve peak gradient measures 35.5 mmHg. Aortic valve area, by VTI measures 1.71 cm. Pulmonic Valve: The pulmonic valve was normal in structure. Pulmonic valve regurgitation is not visualized. No evidence of pulmonic stenosis. Aorta: The aortic root is normal in size and structure. Venous: The inferior vena cava is normal in size with greater than 50% respiratory variability, suggesting right atrial pressure of 3 mmHg. IAS/Shunts: No atrial level shunt detected by color flow Doppler.  LEFT VENTRICLE PLAX 2D LV EF:         Left            Diastology                ventricular     LV e' medial:    5.98 cm/s                ejection        LV E/e' medial:  16.3                fraction by     LV e' lateral:   7.29 cm/s                PLAX is 56      LV E/e' lateral: 13.4                %. LVIDd:         3.20 cm LVIDs:         2.30 cm LV PW:         1.20 cm LV IVS:        1.40 cm LVOT diam:     2.00 cm LV SV:         87 LV SV Index:   59 LVOT Area:     3.14 cm  RIGHT VENTRICLE RV Basal diam:  4.50 cm RV Mid diam:    3.10 cm RV S prime:     14.10 cm/s TAPSE (M-mode): 2.5 cm LEFT ATRIUM             Index        RIGHT ATRIUM           Index LA diam:        2.70 cm 1.82 cm/m   RA Area:     13.00 cm LA Vol (A2C):   50.6 ml 34.12 ml/m  RA Volume:   33.80 ml  22.79 ml/m LA Vol (A4C):   58.9 ml 39.72 ml/m LA Biplane Vol: 55.6 ml 37.49 ml/m  AORTIC VALVE AV Area (Vmax):    1.56 cm AV Area (Vmean):   1.42 cm AV Area (VTI):     1.71 cm  AV Vmax:           298.00 cm/s AV Vmean:          221.000 cm/s AV VTI:            0.511 m AV Peak Grad:      35.5 mmHg AV Mean Grad:      22.0 mmHg LVOT Vmax:         148.00 cm/s LVOT Vmean:        99.900 cm/s LVOT VTI:          0.278 m LVOT/AV VTI ratio: 0.54  AORTA Ao Root diam: 2.80 cm MITRAL  VALVE               TRICUSPID VALVE MV Area (PHT): 4.21 cm    TR Peak grad:   25.0 mmHg MV Area VTI:   2.67 cm    TR Vmax:        250.00 cm/s MV Peak grad:  7.2 mmHg MV Mean grad:  3.0 mmHg    SHUNTS MV Vmax:       1.34 m/s    Systemic VTI:  0.28 m MV Vmean:      73.6 cm/s   Systemic Diam: 2.00 cm MV Decel Time: 180 msec MV E velocity: 97.50 cm/s MV A velocity: 80.70 cm/s MV E/A ratio:  1.21 Julien Nordmann MD Electronically signed by Julien Nordmann MD Signature Date/Time: 02/02/2024/4:30:33 PM    Final    DG Chest Port 1 View Result Date: 02/01/2024 CLINICAL DATA:  Questionable sepsis - evaluate for abnormality. Fall. EXAM: PORTABLE CHEST 1 VIEW COMPARISON:  09/24/2023. FINDINGS: There are heterogeneous opacities overlying the right mid lower lung zones without significant volume loss, concerning for pneumonia. There is subtle blunting of bilateral lateral costophrenic angles, which may represent trace pleural effusions. Elevated left hemidiaphragm noted. There are probable atelectatic changes at the left lung base. No pneumothorax. Stable cardio-mediastinal silhouette. No acute osseous abnormalities. The soft tissues are within normal limits. IMPRESSION: *Heterogeneous opacities overlying the right mid lower lung zones without significant volume loss, concerning for pneumonia. Follow-up to clearing is recommended. *Subtle blunting of bilateral lateral costophrenic angles may represent trace pleural effusions. Electronically Signed   By: Jules Schick M.D.   On: 02/01/2024 13:11   CT Head Wo Contrast Result Date: 02/01/2024 CLINICAL DATA:  Ground-level fall with obvious head injury 2 days ago. Positive LOC. Ongoing  headache.; Ground-level fall with facial injury. Hematoma and swelling over the left eyebrow and left cheek. Dried blood around the mouth; Ground-level fall with head neck trauma EXAM: CT HEAD WITHOUT CONTRAST CT MAXILLOFACIAL WITHOUT CONTRAST CT CERVICAL SPINE WITHOUT CONTRAST TECHNIQUE: Multidetector CT imaging of the head, cervical spine, and maxillofacial structures were performed using the standard protocol without intravenous contrast. Multiplanar CT image reconstructions of the cervical spine and maxillofacial structures were also generated. RADIATION DOSE REDUCTION: This exam was performed according to the departmental dose-optimization program which includes automated exposure control, adjustment of the mA and/or kV according to patient size and/or use of iterative reconstruction technique. COMPARISON:  Head CT 11/29/09 FINDINGS: CT HEAD FINDINGS Brain: No hemorrhage. No hydrocephalus. No extra-axial fluid collection. No mass effect. No mass lesion. No CT evidence of an acute cortical infarct. Vascular: No hyperdense vessel or unexpected calcification. Skull: Normal. Negative for fracture or focal lesion. Other: None. CT MAXILLOFACIAL FINDINGS Osseous: No fracture or mandibular dislocation. No destructive process. There is a lucent lesion of the sphenoid wing on the right, unchanged compared to 11/29/2009 Orbits: Negative. No traumatic or inflammatory finding. Sinuses: No middle ear or mastoid effusion. Paranasal sinuses are clear. Soft tissues: There is soft tissue swelling in the periorbital soft tissues on the left and along left frontal scalp CT CERVICAL SPINE FINDINGS Alignment: Grade 1 anterolisthesis of C3 on C4 and C4 on C5. Skull base and vertebrae: No acute fracture. No primary bone lesion or focal pathologic process. Soft tissues and spinal canal: No prevertebral fluid or swelling. No visible canal hematoma. Disc levels:  No CT evidence high-grade spinal canal stenosis Upper chest: Negative.  Other: None IMPRESSION: 1. No CT evidence of intracranial injury. 2.  No acute facial bone fracture. 3. No acute fracture or traumatic subluxation of the cervical spine. 4. Soft tissue swelling in the periorbital soft tissues on the left and along the left frontal scalp. Electronically Signed   By: Lorenza Cambridge M.D.   On: 02/01/2024 13:02   CT Maxillofacial Wo Contrast Result Date: 02/01/2024 CLINICAL DATA:  Ground-level fall with obvious head injury 2 days ago. Positive LOC. Ongoing headache.; Ground-level fall with facial injury. Hematoma and swelling over the left eyebrow and left cheek. Dried blood around the mouth; Ground-level fall with head neck trauma EXAM: CT HEAD WITHOUT CONTRAST CT MAXILLOFACIAL WITHOUT CONTRAST CT CERVICAL SPINE WITHOUT CONTRAST TECHNIQUE: Multidetector CT imaging of the head, cervical spine, and maxillofacial structures were performed using the standard protocol without intravenous contrast. Multiplanar CT image reconstructions of the cervical spine and maxillofacial structures were also generated. RADIATION DOSE REDUCTION: This exam was performed according to the departmental dose-optimization program which includes automated exposure control, adjustment of the mA and/or kV according to patient size and/or use of iterative reconstruction technique. COMPARISON:  Head CT 11/29/09 FINDINGS: CT HEAD FINDINGS Brain: No hemorrhage. No hydrocephalus. No extra-axial fluid collection. No mass effect. No mass lesion. No CT evidence of an acute cortical infarct. Vascular: No hyperdense vessel or unexpected calcification. Skull: Normal. Negative for fracture or focal lesion. Other: None. CT MAXILLOFACIAL FINDINGS Osseous: No fracture or mandibular dislocation. No destructive process. There is a lucent lesion of the sphenoid wing on the right, unchanged compared to 11/29/2009 Orbits: Negative. No traumatic or inflammatory finding. Sinuses: No middle ear or mastoid effusion. Paranasal sinuses are  clear. Soft tissues: There is soft tissue swelling in the periorbital soft tissues on the left and along left frontal scalp CT CERVICAL SPINE FINDINGS Alignment: Grade 1 anterolisthesis of C3 on C4 and C4 on C5. Skull base and vertebrae: No acute fracture. No primary bone lesion or focal pathologic process. Soft tissues and spinal canal: No prevertebral fluid or swelling. No visible canal hematoma. Disc levels:  No CT evidence high-grade spinal canal stenosis Upper chest: Negative. Other: None IMPRESSION: 1. No CT evidence of intracranial injury. 2. No acute facial bone fracture. 3. No acute fracture or traumatic subluxation of the cervical spine. 4. Soft tissue swelling in the periorbital soft tissues on the left and along the left frontal scalp. Electronically Signed   By: Lorenza Cambridge M.D.   On: 02/01/2024 13:02   CT Cervical Spine Wo Contrast Result Date: 02/01/2024 CLINICAL DATA:  Ground-level fall with obvious head injury 2 days ago. Positive LOC. Ongoing headache.; Ground-level fall with facial injury. Hematoma and swelling over the left eyebrow and left cheek. Dried blood around the mouth; Ground-level fall with head neck trauma EXAM: CT HEAD WITHOUT CONTRAST CT MAXILLOFACIAL WITHOUT CONTRAST CT CERVICAL SPINE WITHOUT CONTRAST TECHNIQUE: Multidetector CT imaging of the head, cervical spine, and maxillofacial structures were performed using the standard protocol without intravenous contrast. Multiplanar CT image reconstructions of the cervical spine and maxillofacial structures were also generated. RADIATION DOSE REDUCTION: This exam was performed according to the departmental dose-optimization program which includes automated exposure control, adjustment of the mA and/or kV according to patient size and/or use of iterative reconstruction technique. COMPARISON:  Head CT 11/29/09 FINDINGS: CT HEAD FINDINGS Brain: No hemorrhage. No hydrocephalus. No extra-axial fluid collection. No mass effect. No mass  lesion. No CT evidence of an acute cortical infarct. Vascular: No hyperdense vessel or unexpected calcification. Skull: Normal. Negative for fracture or focal lesion. Other: None.  CT MAXILLOFACIAL FINDINGS Osseous: No fracture or mandibular dislocation. No destructive process. There is a lucent lesion of the sphenoid wing on the right, unchanged compared to 11/29/2009 Orbits: Negative. No traumatic or inflammatory finding. Sinuses: No middle ear or mastoid effusion. Paranasal sinuses are clear. Soft tissues: There is soft tissue swelling in the periorbital soft tissues on the left and along left frontal scalp CT CERVICAL SPINE FINDINGS Alignment: Grade 1 anterolisthesis of C3 on C4 and C4 on C5. Skull base and vertebrae: No acute fracture. No primary bone lesion or focal pathologic process. Soft tissues and spinal canal: No prevertebral fluid or swelling. No visible canal hematoma. Disc levels:  No CT evidence high-grade spinal canal stenosis Upper chest: Negative. Other: None IMPRESSION: 1. No CT evidence of intracranial injury. 2. No acute facial bone fracture. 3. No acute fracture or traumatic subluxation of the cervical spine. 4. Soft tissue swelling in the periorbital soft tissues on the left and along the left frontal scalp. Electronically Signed   By: Lorenza Cambridge M.D.   On: 02/01/2024 13:02    Microbiology: No results found for this or any previous visit (from the past 240 hours).   Labs: CBC: Recent Labs  Lab 02/19/24 0414 02/22/24 0922 02/24/24 0437  WBC 7.1 10.4 7.4  NEUTROABS  --  8.6*  --   HGB 9.4* 10.4* 9.7*  HCT 30.4* 33.4* 30.4*  MCV 101.0* 100.0 101.0*  PLT 182 197 134*   Basic Metabolic Panel: Recent Labs  Lab 02/19/24 0414 02/22/24 0922 02/24/24 0437  NA 144 142 138  K 4.0 3.3* 3.8  CL 101 93* 95*  CO2 36* 39* 34*  GLUCOSE 91 107* 91  BUN 29* 39* 37*  CREATININE 0.78 0.86 0.89  CALCIUM 8.6* 8.9 8.6*  MG 2.0  --  2.3  PHOS 3.6  --  3.3   Liver Function  Tests: No results for input(s): "AST", "ALT", "ALKPHOS", "BILITOT", "PROT", "ALBUMIN" in the last 168 hours. No results for input(s): "LIPASE", "AMYLASE" in the last 168 hours. No results for input(s): "AMMONIA" in the last 168 hours. Cardiac Enzymes: No results for input(s): "CKTOTAL", "CKMB", "CKMBINDEX", "TROPONINI" in the last 168 hours. BNP (last 3 results) Recent Labs    02/20/24 1112  BNP 206.2*   CBG: No results for input(s): "GLUCAP" in the last 168 hours.  Time spent: 35 minutes  Signed:  Gillis Santa  [facility] 02/25/2024 4:39 PM

## 2024-03-03 LAB — CULTURE, FUNGUS WITHOUT SMEAR

## 2024-03-13 ENCOUNTER — Other Ambulatory Visit: Payer: Self-pay | Admitting: Primary Care

## 2024-03-13 DIAGNOSIS — I10 Essential (primary) hypertension: Secondary | ICD-10-CM

## 2024-03-13 NOTE — Telephone Encounter (Signed)
 Patient needs hospital follow up. Can we get her scheduled? Thanks!

## 2024-03-13 NOTE — Telephone Encounter (Signed)
 Messaged provider about Friday openings

## 2024-03-13 NOTE — Telephone Encounter (Signed)
 Friday is fine, thank you!

## 2024-03-14 NOTE — Telephone Encounter (Signed)
 Lvm to schedule per provider Friday in same day spot ok.

## 2024-03-15 NOTE — Telephone Encounter (Signed)
 Spoke with patient and she stated that she is currently in a rehab.

## 2024-04-17 ENCOUNTER — Telehealth: Payer: Self-pay

## 2024-04-17 NOTE — Telephone Encounter (Signed)
 Copied from CRM [id number]. Topic: Clinical - Prescription Issue >> Apr 17, 2024  9:27 AM Lotus Round B wrote: Reason for CRM: pt called in stating that she received the wrong medication at the pharmacy listril which is supposed to be a high dose . S there any way a nurse can give the patient a call .

## 2024-04-17 NOTE — Telephone Encounter (Signed)
 Called and scheduled patient hosp f/u for 04/20/24, they will discuss medication changes at that appt. Patients caregiver is giving her lisinopril -hydrochlorothiazide  10-12.5mg  2 tablets by mouth daily for her blood pressure.

## 2024-04-20 ENCOUNTER — Encounter: Payer: Self-pay | Admitting: Primary Care

## 2024-04-20 ENCOUNTER — Ambulatory Visit (INDEPENDENT_AMBULATORY_CARE_PROVIDER_SITE_OTHER): Admitting: Primary Care

## 2024-04-20 VITALS — BP 160/68 | HR 76 | Temp 97.9°F | Ht <= 58 in | Wt 130.1 lb

## 2024-04-20 DIAGNOSIS — I10 Essential (primary) hypertension: Secondary | ICD-10-CM

## 2024-04-20 DIAGNOSIS — R569 Unspecified convulsions: Secondary | ICD-10-CM | POA: Diagnosis not present

## 2024-04-20 DIAGNOSIS — R0902 Hypoxemia: Secondary | ICD-10-CM

## 2024-04-20 DIAGNOSIS — I480 Paroxysmal atrial fibrillation: Secondary | ICD-10-CM | POA: Diagnosis not present

## 2024-04-20 DIAGNOSIS — K219 Gastro-esophageal reflux disease without esophagitis: Secondary | ICD-10-CM

## 2024-04-20 DIAGNOSIS — E785 Hyperlipidemia, unspecified: Secondary | ICD-10-CM

## 2024-04-20 MED ORDER — LEVETIRACETAM 250 MG PO TABS: ORAL_TABLET | ORAL | 0 refills | Status: DC

## 2024-04-20 MED ORDER — LISINOPRIL-HYDROCHLOROTHIAZIDE 20-25 MG PO TABS: 1.0000 | ORAL_TABLET | Freq: Every day | ORAL | 0 refills | Status: DC

## 2024-04-20 NOTE — Assessment & Plan Note (Signed)
 Will monitor lipid panel.  Continue atorvastatin  40 mg daily, Zetia  10 mg daily.

## 2024-04-20 NOTE — Progress Notes (Signed)
 Subjective:    Patient ID: Michelle Barnett, female    DOB: [birth year], 63 y.o.   MRN: [id number]  HPI  Michelle Barnett is a very pleasant 63 y.o. female with a history of hypertension, demand ischemia, paroxysmal atrial fibrillation, COPD, acute hypoxic respiratory failure, pneumonia, GERD, acute cephalopathy, AKI, hyperlipidemia, sepsis who presents today for hospital follow-up.  Her partner joins us  today.  She presented to [facility] ED on 02/01/2024 with a presentation of acute hypoxia.  Earlier that day she was evaluated by Dr. Malissa Se, oxygen saturation was 66% on room air, improved to 3 L on oxygen but not get higher to 88%.   During her hospital stay she was treated with DuoNebs, azithromycin , ceftriaxone .  Her hospital stay was complicated by ongoing hypoxia, inability to wean off of oxygen.  She also developed atrial fibrillation with RVR for which cardiology consulted.  She was treated with amiodarone  and heparin  drip.  She was eventually transition to Eliquis  5 mg twice daily and amiodarone  200 mg daily x 1 month.  Her atrial fibrillation was suspected to be provoked by hypoxia and breathing treatments.    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  She was also witnessed to have a tonic-clonic seizure, required intubation with mechanical ventilation.  She underwent MRI brain which revealed PRESS.  She required central line placement for venous access.  She required Levophed .  She was extubated on 02/12/2024 with 3 L of oxygen per nasal cannula.   She was also diagnosed with community-acquired pneumonia. She experienced sundowning and hospital delirium, required Seroquel .  She was assessed by PT and OT.  Her lisinopril -hydrochlorothiazide  20-25 mg tablets were discontinued.  She was discharged to SNF on 02/25/2024 with the following medication changes: Amiodarone  200 mg daily x 1 month total, apixaban  5 mg twice daily, Zetia  10 mg daily, losartan   50 mg daily, metoprolol  tartrate 50 mg twice daily, Seroquel  50 mg at bedtime as needed.  She was discharged from [facility] on 04/15/24. She's feeling much better. Her breathing has improved. She is maintained on oxygen 2 liters per minute since her SNF placement. She maintained 95-98% on this regimen. She needs to wean off oxygen, has not tried yet. She has not seen neurology.   Her BP has been running high since her hospital stay. She is checking her BP at home which is running 140-150/70. This was the same as her BP at the SNF. She has declines physical therapy. She is walking at home with assistance. She's not sure why she is taking pantoprazole . She denies seizures since her hospital stay.   BP Readings from Last 3 Encounters:  04/20/24 (!) 160/68  02/25/24 (!) 153/70  02/01/24 110/62   Wt Readings from Last 3 Encounters:  04/20/24 130 lb 2 oz (59 kg)  02/25/24 124 lb (56.2 kg)  09/24/23 122 lb (55.3 kg)     Review of Systems  Respiratory:  Negative for shortness of breath.   Cardiovascular:  Negative for chest pain.  Gastrointestinal:  Negative for constipation and diarrhea.  Neurological:  Negative for dizziness and weakness.         Past Medical History:  Diagnosis Date   Acute encephalopathy 02/07/2024   Acute hypoxic respiratory failure (HCC) 09/19/2016   AKI (acute kidney injury) (HCC) 02/01/2024   Asthma    Atrial fibrillation with RVR (HCC) 02/02/2024   Bicuspid aortic valve 09/20/2016   a. echo 09/19/16: EF 55-60%,  GR1DD, possible bicuspid aortic valve without evidence of AS   Bronchitis    Coronary artery disease, non-occlusive    a. cath 09/21/16: ostLM to LM 20%, no evidence of aortic stenosis   Demand ischemia (HCC) 09/20/2016   Head injury 02/01/2024   Heart murmur    Hiatal hernia    History of blood transfusion    Hypertension    Persistent cough for 3 weeks or longer 05/19/2022   Pneumonia of right lower lobe due to infectious organism 02/01/2024    Rash and nonspecific skin eruption 09/09/2023   Sepsis (HCC) 02/02/2024   Ulcer     Social History   Socioeconomic History   Marital status: Single    Spouse name: Not on file   Number of children: Not on file   Years of education: Not on file   Highest education level: Not on file  Occupational History   Not on file  Tobacco Use   Smoking status: Never   Smokeless tobacco: Never  Substance and Sexual Activity   Alcohol use: No   Drug use: No   Sexual activity: Not Currently  Other Topics Concern   Not on file  Social History Narrative   Not on file   Social Drivers of Health   Financial Resource Strain: Not on file  Food Insecurity: No Food Insecurity (02/03/2024)   Hunger Vital Sign    Worried About Running Out of Food in the Last Year: Never true    Ran Out of Food in the Last Year: Never true  Transportation Needs: No Transportation Needs (02/03/2024)   PRAPARE - Administrator, Civil Service (Medical): No    Lack of Transportation (Non-Medical): No  Physical Activity: Not on file  Stress: Not on file  Social Connections: Moderately Integrated (02/03/2024)   Social Connection and Isolation Panel [NHANES]    Frequency of Communication with Friends and Family: Twice a week    Frequency of Social Gatherings with Friends and Family: Once a week    Attends Religious Services: 1 to 4 times per year    Active Member of [employer] or Organizations: No    Attends Banker Meetings: Never    Marital Status: Living with partner  Intimate Partner Violence: Not At Risk (02/03/2024)   Humiliation, Afraid, Rape, and Kick questionnaire    Fear of Current or Ex-Partner: No    Emotionally Abused: No    Physically Abused: No    Sexually Abused: No    Past Surgical History:  Procedure Laterality Date   abdominal tumor     ABLATION     CARDIAC CATHETERIZATION N/A 09/21/2016   Procedure: Left Heart Cath and Coronary Angiography;  Surgeon: Wenona Hamilton, MD;   Location: [facility] INVASIVE CV LAB;  Service: Cardiovascular;  Laterality: N/A;   COLONOSCOPY N/A 08/14/2016   Procedure: COLONOSCOPY;  Surgeon: Albertina Hugger, MD;  Location: [facility] ENDOSCOPY;  Service: Endoscopy;  Laterality: N/A;   ESOPHAGOGASTRODUODENOSCOPY N/A 08/13/2016   Procedure: ESOPHAGOGASTRODUODENOSCOPY (EGD);  Surgeon: Albertina Hugger, MD;  Location: [facility] ENDOSCOPY;  Service: Gastroenterology;  Laterality: N/A;   TOOTH EXTRACTION      Family History  Problem Relation Age of Onset   Stroke Mother    Hypertension Mother    Hypertension Father    Heart disease Father    Hypertension Brother    Breast cancer Maternal Aunt     Allergies  Allergen Reactions   Amlodipine  Rash   Aspirin  Anaphylaxis   Dairy Aid [Tilactase] Swelling and Other (See Comments)    Any dairy products   Benadryl [Diphenhydramine] Palpitations   Penicillins Other (See Comments)    Reaction: Unknown    Current Outpatient Medications on File Prior to Visit  Medication Sig Dispense Refill   albuterol  (VENTOLIN  HFA) 108 (90 Base) MCG/ACT inhaler INHALE 2 PUFFS BY MOUTH EVERY 6 HOURS AS NEEDED FOR WHEEZING FOR SHORTNESS OF BREATH 9 g 0   apixaban  (ELIQUIS ) 5 MG TABS tablet Take 1 tablet (5 mg total) by mouth 2 (two) times daily.     Ascorbic Acid  (VITAMIN C ) 1000 MG tablet Take 1,000 mg by mouth 2 (two) times daily.      atorvastatin  (LIPITOR) 40 MG tablet TAKE 1 TABLET BY MOUTH ONCE DAILY FOR CHOLESTEROL 90 tablet 3   ezetimibe  (ZETIA ) 10 MG tablet Take 1 tablet (10 mg total) by mouth daily.     ferrous sulfate  325 (65 FE) MG tablet Take 650 mg by mouth 2 (two) times daily.     Fluticasone  Furoate (ARNUITY ELLIPTA ) 100 MCG/ACT AEPB Inhale 1 puff into the lungs daily. 90 each 3   magnesium  oxide (MAG-OX) 400 MG tablet Take 400 mg by mouth daily.     metoprolol  tartrate (LOPRESSOR ) 50 MG tablet Take 1 tablet (50 mg total) by mouth 2 (two) times daily.     Multiple Vitamins-Calcium  (ONE-A-DAY WOMENS PO) Take 1  tablet by mouth daily.     Omega-3 Fatty Acids (FISH OIL) 1200 MG CAPS Take 1 capsule by mouth daily.      polyethylene glycol (MIRALAX  / GLYCOLAX ) 17 g packet Take 17 g by mouth daily.     senna-docusate (SENOKOT-S) 8.6-50 MG tablet Take 1 tablet by mouth at bedtime as needed for mild constipation.     vitamin B-12 (CYANOCOBALAMIN ) 1000 MCG tablet Take 1,000 mcg by mouth daily.     No current facility-administered medications on file prior to visit.    BP (!) 160/68 (BP Location: Left Arm, Patient Position: Sitting, Cuff Size: Normal)   Pulse 76   Temp 97.9 F (36.6 C) (Temporal)   Ht 4\' 9"  (1.448 m)   Wt 130 lb 2 oz (59 kg)   SpO2 98%   BMI 28.16 kg/m  Objective:   Physical Exam Cardiovascular:     Rate and Rhythm: Normal rate and regular rhythm.  Pulmonary:     Effort: Pulmonary effort is normal.     Breath sounds: Normal breath sounds.  Musculoskeletal:     Cervical back: Neck supple.  Skin:    General: Skin is warm and dry.  Neurological:     Mental Status: She is alert and oriented to person, place, and time.  Psychiatric:        Mood and Affect: Mood normal.           Assessment & Plan:  Hypoxia Assessment & Plan: In the setting of pneumonia with recent hospitalization. Hospital notes, labs, imaging reviewed from February 2025.  In the clinic today, she was without oxygen for 15 minutes.  Room air saturation dropped to 90%. Will attempt to wean off of oxygen slowly.  Recommended to reduce to 1 L per nasal cannula while monitoring oxygen levels at home.  Wean gradually from there.  Instructions provided regarding monitoring oxygen saturation levels at rest and exertion.  Respiratory exam today reassuring.   Seizures (HCC) Assessment & Plan: Secondary to PRESS during recent hospitalization. Reviewed neurology notes which states that patient  may wean off Keppra  several months after discharge.  As she has been seizure-free, we will begin the weaning off of  Keppra .  Patient agrees.  Reduce Keppra  to 250 mg twice daily x 2 weeks, then to 250 mg daily x 2 weeks, then stop. Instructions provided to patient.  Orders: -     levETIRAcetam ; Take 1 tablet twice daily for 2 weeks, then 1 tablet once daily for 2 weeks, then stop. For seizures.  Dispense: 42 tablet; Refill: 0  Essential hypertension Assessment & Plan: Above goal today, also at home and during SNF stay.  Stop losartan  50 mg daily.  Start lisinopril -hydrochlorothiazide  20-25 milligrams once daily. Continue metoprolol  titrate 50 mg twice daily.  Will plan to see her back in 2 weeks for blood pressure follow-up and BMP.  Orders: -     Lisinopril -hydroCHLOROthiazide ; Take 1 tablet by mouth daily. for blood pressure.  Dispense: 90 tablet; Refill: 0  Paroxysmal atrial fibrillation [facility]) Assessment & Plan: In the setting of pneumonia from recent hospitalization. Rate and rhythm regular today.  Continue apixaban  5 mg twice daily for now. Continue metoprolol  tartrate 50 mg twice daily.  Will refer to cardiology as recommended upon discharge.  Orders: -     Ambulatory referral to Cardiology  Gastroesophageal reflux disease, unspecified whether esophagitis present Assessment & Plan: Controlled prior to hospitalization.  Discontinue pantoprazole  40 mg daily. She will update if she experiences any reflux symptoms.   Hyperlipidemia, unspecified hyperlipidemia type Assessment & Plan: Will monitor lipid panel.  Continue atorvastatin  40 mg daily, Zetia  10 mg daily.     45 minutes was spent face-to-face with patient, reviewing medical records from hospitalization, reviewing current medications and changing regimen.  See assessment plan.    Neaveh Belanger K Audwin Semper, NP

## 2024-04-20 NOTE — Assessment & Plan Note (Signed)
 Secondary to PRESS during recent hospitalization. Reviewed neurology notes which states that patient may wean off Keppra  several months after discharge.  As she has been seizure-free, we will begin the weaning off of Keppra .  Patient agrees.  Reduce Keppra  to 250 mg twice daily x 2 weeks, then to 250 mg daily x 2 weeks, then stop. Instructions provided to patient.

## 2024-04-20 NOTE — Patient Instructions (Addendum)
 Stop taking pantoprazole  pills for swallowing.  You will wean off of the Keppra  medication for seizures.  Start Keppra  250 mg tablets.  Take 1 tablet by mouth twice daily x 2 weeks, then take 1 tablet by mouth once daily x 2 weeks, then stop.  Stop taking losartan  50 mg for blood pressure.  Start taking lisinopril -hydrochlorothiazide  20-25 mg pills.  Take 1 tablet by mouth once daily for blood pressure.  Monitor your oxygen levels at home off of oxygen as discussed.  Please schedule a follow up visit to meet back with me in 2-3 weeks for blood pressure check.   It was a pleasure to see you today!

## 2024-04-20 NOTE — Assessment & Plan Note (Signed)
 In the setting of pneumonia with recent hospitalization. Hospital notes, labs, imaging reviewed from February 2025.  In the clinic today, she was without oxygen for 15 minutes.  Room air saturation dropped to 90%. Will attempt to wean off of oxygen slowly.  Recommended to reduce to 1 L per nasal cannula while monitoring oxygen levels at home.  Wean gradually from there.  Instructions provided regarding monitoring oxygen saturation levels at rest and exertion.  Respiratory exam today reassuring.

## 2024-04-20 NOTE — Assessment & Plan Note (Signed)
 Above goal today, also at home and during SNF stay.  Stop losartan  50 mg daily.  Start lisinopril -hydrochlorothiazide  20-25 milligrams once daily. Continue metoprolol  titrate 50 mg twice daily.  Will plan to see her back in 2 weeks for blood pressure follow-up and BMP.

## 2024-04-20 NOTE — Assessment & Plan Note (Signed)
 Controlled prior to hospitalization.  Discontinue pantoprazole  40 mg daily. She will update if she experiences any reflux symptoms.

## 2024-04-20 NOTE — Assessment & Plan Note (Signed)
 In the setting of pneumonia from recent hospitalization. Rate and rhythm regular today.  Continue apixaban  5 mg twice daily for now. Continue metoprolol  tartrate 50 mg twice daily.  Will refer to cardiology as recommended upon discharge.

## 2024-05-04 ENCOUNTER — Ambulatory Visit: Admitting: Primary Care

## 2024-05-05 ENCOUNTER — Ambulatory Visit (INDEPENDENT_AMBULATORY_CARE_PROVIDER_SITE_OTHER): Admitting: Primary Care

## 2024-05-05 ENCOUNTER — Encounter: Payer: Self-pay | Admitting: Primary Care

## 2024-05-05 VITALS — BP 128/76 | HR 80 | Temp 98.2°F | Ht <= 58 in | Wt 130.0 lb

## 2024-05-05 DIAGNOSIS — E785 Hyperlipidemia, unspecified: Secondary | ICD-10-CM

## 2024-05-05 DIAGNOSIS — J4540 Moderate persistent asthma, uncomplicated: Secondary | ICD-10-CM

## 2024-05-05 DIAGNOSIS — J449 Chronic obstructive pulmonary disease, unspecified: Secondary | ICD-10-CM

## 2024-05-05 DIAGNOSIS — I10 Essential (primary) hypertension: Secondary | ICD-10-CM

## 2024-05-05 LAB — BASIC METABOLIC PANEL WITH GFR
BUN: 24 mg/dL — ABNORMAL HIGH (ref 6–23)
CO2: 37 meq/L — ABNORMAL HIGH (ref 19–32)
Calcium: 9.6 mg/dL (ref 8.4–10.5)
Chloride: 97 meq/L (ref 96–112)
Creatinine, Ser: 0.9 mg/dL (ref 0.40–1.20)
GFR: 67.75 mL/min (ref >60.00)
Glucose, Bld: 83 mg/dL (ref 70–99)
Potassium: 3.8 meq/L (ref 3.5–5.1)
Sodium: 143 meq/L (ref 135–145)

## 2024-05-05 MED ORDER — ARNUITY ELLIPTA 100 MCG/ACT IN AEPB: 1.0000 | INHALATION_SPRAY | Freq: Every day | RESPIRATORY_TRACT | 0 refills | Status: AC

## 2024-05-05 MED ORDER — ALBUTEROL SULFATE HFA 108 (90 BASE) MCG/ACT IN AERS: INHALATION_SPRAY | RESPIRATORY_TRACT | 0 refills | Status: DC

## 2024-05-05 MED ORDER — EZETIMIBE 10 MG PO TABS: 10.0000 mg | ORAL_TABLET | Freq: Every day | ORAL | 0 refills | Status: DC

## 2024-05-05 MED ORDER — ATORVASTATIN CALCIUM 40 MG PO TABS: 40.0000 mg | ORAL_TABLET | Freq: Every day | ORAL | 0 refills | Status: DC

## 2024-05-05 NOTE — Assessment & Plan Note (Signed)
 Improving but wheezing on exam noted.  Resume Arnuity Ellipta  100 mcg once daily.  Rx for albuterol  inhaler sent to pharmacy to use PRN.  Continue oxygen for now, discussed weaning instructions.

## 2024-05-05 NOTE — Assessment & Plan Note (Signed)
 Improved and controlled.  Continue lisinopril -hydrochlorothiazide  20-25 mg daily. BMP pending.

## 2024-05-05 NOTE — Patient Instructions (Signed)
 Resume the Arnuity Ellipta  inhaler once daily for breathing.  Use the albuterol  inhaler. Inhale 2 puffs into the lungs every 4 to 6 hours as needed for wheezing, cough, and/or shortness of breath.   Resume atorvastatin  40 mg daily and Zetia  10 mg daily for cholesterol.  Please schedule a physical to meet with me in 1 month.  It was a pleasure to see you today!

## 2024-05-05 NOTE — Progress Notes (Signed)
 Subjective:    Patient ID: Michelle Barnett, female    DOB: Jan 22, 1961, 63 y.o.   MRN: [id number]  HPI  Michelle Barnett is a very pleasant 63 y.o. female with a history of hypertension, paroxysmal atrial fibrillation, COPD, asthma, hyperlipidemia who presents today for follow-up of hypertension.  She was last evaluated by me on 04/20/2024 for hospital follow-up pneumonia with sepsis, paroxysmal atrial fibrillation new diagnosis.  During this visit blood pressure was noted to be above goal despite management on losartan  50, patient also endorsed this was the case during SNF stay.  Because of this she was switched back to lisinopril -hydrochlorothiazide  20-25 mg daily for which she had been well on previously.  Her metoprolol  tartrate 50 mg twice daily was continued.  Since her last visit she is compliant to lisinopril -hydrochlorothiazide  20-25 mg daily and metoprolol  tartrate 50 mg twice daily.  She's feeling better. Her shortness of breath has improved. She's continued to use her oxygen at 3 liters/minute with 30-45 minute breaks. She denies headaches, chest pain.   BP Readings from Last 3 Encounters:  05/05/24 128/76  04/20/24 (!) 160/68  02/25/24 (!) 153/70     Review of Systems  Respiratory:  Negative for shortness of breath.   Cardiovascular:  Negative for chest pain.  Neurological:  Negative for dizziness and headaches.         Past Medical History:  Diagnosis Date   Acute encephalopathy 02/07/2024   Acute hypoxic respiratory failure (HCC) 09/19/2016   AKI (acute kidney injury) (HCC) 02/01/2024   Asthma    Atrial fibrillation with RVR (HCC) 02/02/2024   Bicuspid aortic valve 09/20/2016   a. echo 09/19/16: EF 55-60%, GR1DD, possible bicuspid aortic valve without evidence of AS   Bronchitis    Coronary artery disease, non-occlusive    a. cath 09/21/16: ostLM to LM 20%, no evidence of aortic stenosis   Demand ischemia (HCC) 09/20/2016   Head injury 02/01/2024   Heart  murmur    Hiatal hernia    History of blood transfusion    Hypertension    Persistent cough for 3 weeks or longer 05/19/2022   Pneumonia of right lower lobe due to infectious organism 02/01/2024   Rash and nonspecific skin eruption 09/09/2023   Sepsis (HCC) 02/02/2024   Ulcer     Social History   Socioeconomic History   Marital status: Single    Spouse name: Not on file   Number of children: Not on file   Years of education: Not on file   Highest education level: Not on file  Occupational History   Not on file  Tobacco Use   Smoking status: Never   Smokeless tobacco: Never  Substance and Sexual Activity   Alcohol use: No   Drug use: No   Sexual activity: Not Currently  Other Topics Concern   Not on file  Social History Narrative   Not on file   Social Drivers of Health   Financial Resource Strain: Not on file  Food Insecurity: No Food Insecurity (02/03/2024)   Hunger Vital Sign    Worried About Running Out of Food in the Last Year: Never true    Ran Out of Food in the Last Year: Never true  Transportation Needs: No Transportation Needs (02/03/2024)   PRAPARE - Administrator, Civil Service (Medical): No    Lack of Transportation (Non-Medical): No  Physical Activity: Not on file  Stress: Not on file  Social Connections: Moderately Integrated (02/03/2024)  Social Connection and Isolation Panel [NHANES]    Frequency of Communication with Friends and Family: Twice a week    Frequency of Social Gatherings with Friends and Family: Once a week    Attends Religious Services: 1 to 4 times per year    Active Member of [employer] or Organizations: No    Attends Banker Meetings: Never    Marital Status: Living with partner  Intimate Partner Violence: Not At Risk (02/03/2024)   Humiliation, Afraid, Rape, and Kick questionnaire    Fear of Current or Ex-Partner: No    Emotionally Abused: No    Physically Abused: No    Sexually Abused: No    Past Surgical  History:  Procedure Laterality Date   abdominal tumor     ABLATION     CARDIAC CATHETERIZATION N/A 09/21/2016   Procedure: Left Heart Cath and Coronary Angiography;  Surgeon: Wenona Hamilton, MD;  Location: [facility] INVASIVE CV LAB;  Service: Cardiovascular;  Laterality: N/A;   COLONOSCOPY N/A 08/14/2016   Procedure: COLONOSCOPY;  Surgeon: Albertina Hugger, MD;  Location: [facility] ENDOSCOPY;  Service: Endoscopy;  Laterality: N/A;   ESOPHAGOGASTRODUODENOSCOPY N/A 08/13/2016   Procedure: ESOPHAGOGASTRODUODENOSCOPY (EGD);  Surgeon: Albertina Hugger, MD;  Location: [facility] ENDOSCOPY;  Service: Gastroenterology;  Laterality: N/A;   TOOTH EXTRACTION      Family History  Problem Relation Age of Onset   Stroke Mother    Hypertension Mother    Hypertension Father    Heart disease Father    Hypertension Brother    Breast cancer Maternal Aunt     Allergies  Allergen Reactions   Amlodipine  Rash   Aspirin Anaphylaxis   Dairy Aid [Tilactase] Swelling and Other (See Comments)    Any dairy products   Benadryl [Diphenhydramine] Palpitations   Penicillins Other (See Comments)    Reaction: Unknown    Current Outpatient Medications on File Prior to Visit  Medication Sig Dispense Refill   apixaban  (ELIQUIS ) 5 MG TABS tablet Take 1 tablet (5 mg total) by mouth 2 (two) times daily.     Ascorbic Acid  (VITAMIN C ) 1000 MG tablet Take 1,000 mg by mouth 2 (two) times daily.      levETIRAcetam  (KEPPRA ) 250 MG tablet Take 1 tablet twice daily for 2 weeks, then 1 tablet once daily for 2 weeks, then stop. For seizures. 42 tablet 0   lisinopril -hydrochlorothiazide  (ZESTORETIC ) 20-25 MG tablet Take 1 tablet by mouth daily. for blood pressure. 90 tablet 0   ferrous sulfate  325 (65 FE) MG tablet Take 650 mg by mouth 2 (two) times daily. (Patient not taking: Reported on 05/05/2024)     magnesium  oxide (MAG-OX) 400 MG tablet Take 400 mg by mouth daily. (Patient not taking: Reported on 05/05/2024)     metoprolol  tartrate  (LOPRESSOR ) 50 MG tablet Take 1 tablet (50 mg total) by mouth 2 (two) times daily. (Patient not taking: Reported on 05/05/2024)     Multiple Vitamins-Calcium  (ONE-A-DAY WOMENS PO) Take 1 tablet by mouth daily. (Patient not taking: Reported on 05/05/2024)     Omega-3 Fatty Acids (FISH OIL) 1200 MG CAPS Take 1 capsule by mouth daily.  (Patient not taking: Reported on 05/05/2024)     polyethylene glycol (MIRALAX  / GLYCOLAX ) 17 g packet Take 17 g by mouth daily. (Patient not taking: Reported on 05/05/2024)     senna-docusate (SENOKOT-S) 8.6-50 MG tablet Take 1 tablet by mouth at bedtime as needed for mild constipation. (Patient not taking: Reported on 05/05/2024)  vitamin B-12 (CYANOCOBALAMIN ) 1000 MCG tablet Take 1,000 mcg by mouth daily. (Patient not taking: Reported on 05/05/2024)     No current facility-administered medications on file prior to visit.    BP 128/76   Pulse 80   Temp 98.2 F (36.8 C) (Temporal)   Ht 4\' 9"  (1.448 m)   Wt 130 lb (59 kg)   SpO2 91%   BMI 28.13 kg/m  Objective:   Physical Exam Cardiovascular:     Rate and Rhythm: Normal rate and regular rhythm.  Pulmonary:     Effort: Pulmonary effort is normal.     Breath sounds: Examination of the right-upper field reveals wheezing. Examination of the left-upper field reveals wheezing. Examination of the right-lower field reveals wheezing. Examination of the left-lower field reveals wheezing. Wheezing present.  Musculoskeletal:     Cervical back: Neck supple.  Skin:    General: Skin is warm and dry.  Neurological:     Mental Status: She is alert and oriented to person, place, and time.  Psychiatric:        Mood and Affect: Mood normal.           Assessment & Plan:  Essential hypertension Assessment & Plan: Improved and controlled.  Continue lisinopril -hydrochlorothiazide  20-25 mg daily. BMP pending.    Orders: -     Basic metabolic panel with GFR  Hyperlipidemia, unspecified hyperlipidemia type -      Atorvastatin  Calcium ; Take 1 tablet (40 mg total) by mouth daily. for cholesterol.  Dispense: 90 tablet; Refill: 0 -     Ezetimibe ; Take 1 tablet (10 mg total) by mouth daily. for cholesterol.  Dispense: 90 tablet; Refill: 0  COPD, severity to be determined [facility]) Assessment & Plan: Improving but wheezing on exam noted.  Resume Arnuity Ellipta  100 mcg once daily.  Rx for albuterol  inhaler sent to pharmacy to use PRN.  Continue oxygen for now, discussed weaning instructions.   Orders: -     Arnuity Ellipta ; Inhale 1 puff into the lungs daily.  Dispense: 90 each; Refill: 0 -     Albuterol  Sulfate HFA; INHALE 2 PUFFS BY MOUTH EVERY 6 HOURS AS NEEDED FOR WHEEZING FOR SHORTNESS OF BREATH  Dispense: 9 g; Refill: 0  Moderate persistent asthma without complication Assessment & Plan: Improving but wheezing on exam noted.  Resume Arnuity Ellipta  100 mcg once daily.  Rx for albuterol  inhaler sent to pharmacy to use PRN.  Continue oxygen for now, discussed weaning instructions.   Orders: -     Arnuity Ellipta ; Inhale 1 puff into the lungs daily.  Dispense: 90 each; Refill: 0 -     Albuterol  Sulfate HFA; INHALE 2 PUFFS BY MOUTH EVERY 6 HOURS AS NEEDED FOR WHEEZING FOR SHORTNESS OF BREATH  Dispense: 9 g; Refill: 0        Gabriel John, NP

## 2024-05-24 ENCOUNTER — Other Ambulatory Visit: Payer: Self-pay | Admitting: Primary Care

## 2024-05-24 ENCOUNTER — Other Ambulatory Visit (HOSPITAL_COMMUNITY)
Admission: RE | Admit: 2024-05-24 | Discharge: 2024-05-24 | Disposition: A | Discharge status: Home / Self Care | Source: Ambulatory Visit | Attending: Primary Care | Admitting: Primary Care

## 2024-05-24 ENCOUNTER — Encounter: Payer: Self-pay | Admitting: Primary Care

## 2024-05-24 ENCOUNTER — Ambulatory Visit: Admitting: Primary Care

## 2024-05-24 VITALS — BP 128/72 | HR 86 | Temp 97.2°F | Ht <= 58 in | Wt 131.0 lb

## 2024-05-24 DIAGNOSIS — Z23 Encounter for immunization: Secondary | ICD-10-CM

## 2024-05-24 DIAGNOSIS — I10 Essential (primary) hypertension: Secondary | ICD-10-CM | POA: Diagnosis not present

## 2024-05-24 DIAGNOSIS — Z0000 Encounter for general adult medical examination without abnormal findings: Secondary | ICD-10-CM

## 2024-05-24 DIAGNOSIS — J4540 Moderate persistent asthma, uncomplicated: Secondary | ICD-10-CM

## 2024-05-24 DIAGNOSIS — K219 Gastro-esophageal reflux disease without esophagitis: Secondary | ICD-10-CM

## 2024-05-24 DIAGNOSIS — R569 Unspecified convulsions: Secondary | ICD-10-CM

## 2024-05-24 DIAGNOSIS — E785 Hyperlipidemia, unspecified: Secondary | ICD-10-CM

## 2024-05-24 DIAGNOSIS — Z124 Encounter for screening for malignant neoplasm of cervix: Secondary | ICD-10-CM | POA: Diagnosis present

## 2024-05-24 DIAGNOSIS — F4323 Adjustment disorder with mixed anxiety and depressed mood: Secondary | ICD-10-CM

## 2024-05-24 DIAGNOSIS — I480 Paroxysmal atrial fibrillation: Secondary | ICD-10-CM

## 2024-05-24 DIAGNOSIS — J449 Chronic obstructive pulmonary disease, unspecified: Secondary | ICD-10-CM

## 2024-05-24 DIAGNOSIS — Z1231 Encounter for screening mammogram for malignant neoplasm of breast: Secondary | ICD-10-CM

## 2024-05-24 MED ORDER — APIXABAN 5 MG PO TABS: 5.0000 mg | ORAL_TABLET | Freq: Two times a day (BID) | ORAL | 3 refills | Status: AC

## 2024-05-24 NOTE — Progress Notes (Signed)
 Subjective:    Patient ID: ESHAL PROPPS, female    DOB: [birth year], 63 y.o.   MRN: [id number]  HPI  GEORGEANN BRINKMAN is a very pleasant 63 y.o. female who presents today for complete physical and follow up of chronic conditions.  Her significant other joins us  today.  She was last evaluated on 04/20/2024 for hospital follow-up.  During this visit her blood pressure within goal so we discontinued losartan  50 mg daily and start lisinopril -hydrochlorothiazide  20-25 mg daily.  Her metoprolol  tartrate was continued at 50 mg twice daily.  Since her last visit she is compliant to lisinopril -hydrochlorothiazide  20-25 mg daily.   Immunizations: -Tetanus: Completed in 2022  -Shingles: Completed Shingrix  series -Pneumonia: Completed in 2017  Diet: Fair diet.  Exercise: No regular exercise.  Eye exam: Completes annually  Dental exam: Completes semi-annually    Pap Smear: Completed in May 2022 Mammogram: Completed in 2017  Colonoscopy: Completed in 2017, due 2027  BP Readings from Last 3 Encounters:  05/24/24 128/72  05/05/24 128/76  04/20/24 (!) 160/68       Review of Systems  Constitutional:  Negative for unexpected weight change.  HENT:  Negative for rhinorrhea.   Respiratory:  Negative for cough and shortness of breath.   Cardiovascular:  Negative for chest pain.  Gastrointestinal:  Negative for constipation and diarrhea.  Genitourinary:  Negative for difficulty urinating.  Musculoskeletal:  Negative for arthralgias and myalgias.  Skin:  Negative for rash.  Allergic/Immunologic: Negative for environmental allergies.  Neurological:  Negative for dizziness, numbness and headaches.  Psychiatric/Behavioral:  The patient is not nervous/anxious.          Past Medical History:  Diagnosis Date   Acute encephalopathy 02/07/2024   Acute hypoxic respiratory failure (HCC) 09/19/2016   AKI (acute kidney injury) (HCC) 02/01/2024   Asthma    Atrial fibrillation with RVR  (HCC) 02/02/2024   Bicuspid aortic valve 09/20/2016   a. echo 09/19/16: EF 55-60%, GR1DD, possible bicuspid aortic valve without evidence of AS   Bronchitis    Coronary artery disease, non-occlusive    a. cath 09/21/16: ostLM to LM 20%, no evidence of aortic stenosis   Demand ischemia (HCC) 09/20/2016   Head injury 02/01/2024   Heart murmur    Hiatal hernia    History of blood transfusion    Hypertension    Persistent cough for 3 weeks or longer 05/19/2022   Pneumonia of right lower lobe due to infectious organism 02/01/2024   Rash and nonspecific skin eruption 09/09/2023   Sepsis (HCC) 02/02/2024   Ulcer     Social History   Socioeconomic History   Marital status: Single    Spouse name: Not on file   Number of children: Not on file   Years of education: Not on file   Highest education level: Not on file  Occupational History   Not on file  Tobacco Use   Smoking status: Never   Smokeless tobacco: Never  Substance and Sexual Activity   Alcohol use: No   Drug use: No   Sexual activity: Not Currently  Other Topics Concern   Not on file  Social History Narrative   Not on file   Social Drivers of Health   Financial Resource Strain: Not on file  Food Insecurity: No Food Insecurity (02/03/2024)   Hunger Vital Sign    Worried About Running Out of Food in the Last Year: Never true    Ran Out of Food in the Last  Year: Never true  Transportation Needs: No Transportation Needs (02/03/2024)   PRAPARE - Administrator, Civil Service (Medical): No    Lack of Transportation (Non-Medical): No  Physical Activity: Not on file  Stress: Not on file  Social Connections: Moderately Integrated (02/03/2024)   Social Connection and Isolation Panel [NHANES]    Frequency of Communication with Friends and Family: Twice a week    Frequency of Social Gatherings with Friends and Family: Once a week    Attends Religious Services: 1 to 4 times per year    Active Member of [employer] or  Organizations: No    Attends Banker Meetings: Never    Marital Status: Living with partner  Intimate Partner Violence: Not At Risk (02/03/2024)   Humiliation, Afraid, Rape, and Kick questionnaire    Fear of Current or Ex-Partner: No    Emotionally Abused: No    Physically Abused: No    Sexually Abused: No    Past Surgical History:  Procedure Laterality Date   abdominal tumor     ABLATION     CARDIAC CATHETERIZATION N/A 09/21/2016   Procedure: Left Heart Cath and Coronary Angiography;  Surgeon: Wenona Hamilton, MD;  Location: [facility] INVASIVE CV LAB;  Service: Cardiovascular;  Laterality: N/A;   COLONOSCOPY N/A 08/14/2016   Procedure: COLONOSCOPY;  Surgeon: Albertina Hugger, MD;  Location: [facility] ENDOSCOPY;  Service: Endoscopy;  Laterality: N/A;   ESOPHAGOGASTRODUODENOSCOPY N/A 08/13/2016   Procedure: ESOPHAGOGASTRODUODENOSCOPY (EGD);  Surgeon: Albertina Hugger, MD;  Location: [facility] ENDOSCOPY;  Service: Gastroenterology;  Laterality: N/A;   TOOTH EXTRACTION      Family History  Problem Relation Age of Onset   Stroke Mother    Hypertension Mother    Hypertension Father    Heart disease Father    Hypertension Brother    Breast cancer Maternal Aunt     Allergies  Allergen Reactions   Amlodipine  Rash   Aspirin Anaphylaxis   Dairy Aid [Tilactase] Swelling and Other (See Comments)    Any dairy products   Benadryl [Diphenhydramine] Palpitations   Penicillins Other (See Comments)    Reaction: Unknown    Current Outpatient Medications on File Prior to Visit  Medication Sig Dispense Refill   albuterol  (VENTOLIN  HFA) 108 (90 Base) MCG/ACT inhaler INHALE 2 PUFFS BY MOUTH EVERY 6 HOURS AS NEEDED FOR WHEEZING FOR SHORTNESS OF BREATH 9 g 0   Ascorbic Acid  (VITAMIN C ) 1000 MG tablet Take 1,000 mg by mouth 2 (two) times daily.      atorvastatin  (LIPITOR) 40 MG tablet Take 1 tablet (40 mg total) by mouth daily. for cholesterol. 90 tablet 0   ezetimibe  (ZETIA ) 10 MG tablet Take 1  tablet (10 mg total) by mouth daily. for cholesterol. 90 tablet 0   ferrous sulfate  325 (65 FE) MG tablet Take 650 mg by mouth 2 (two) times daily.     Fluticasone  Furoate (ARNUITY ELLIPTA ) 100 MCG/ACT AEPB Inhale 1 puff into the lungs daily. 90 each 0   lisinopril -hydrochlorothiazide  (ZESTORETIC ) 20-25 MG tablet Take 1 tablet by mouth daily. for blood pressure. 90 tablet 0   magnesium  oxide (MAG-OX) 400 MG tablet Take 400 mg by mouth daily.     Multiple Vitamins-Calcium  (ONE-A-DAY WOMENS PO) Take 1 tablet by mouth daily.     Omega-3 Fatty Acids (FISH OIL) 1200 MG CAPS Take 1 capsule by mouth daily.     polyethylene glycol (MIRALAX  / GLYCOLAX ) 17 g packet Take 17 g by  mouth daily.     vitamin B-12 (CYANOCOBALAMIN ) 1000 MCG tablet Take 1,000 mcg by mouth daily.     metoprolol  tartrate (LOPRESSOR ) 50 MG tablet Take 1 tablet (50 mg total) by mouth 2 (two) times daily. (Patient not taking: Reported on 05/24/2024)     senna-docusate (SENOKOT-S) 8.6-50 MG tablet Take 1 tablet by mouth at bedtime as needed for mild constipation. (Patient not taking: Reported on 05/24/2024)     No current facility-administered medications on file prior to visit.    BP 128/72   Pulse 86   Temp (!) 97.2 F (36.2 C) (Temporal)   Ht 4\' 9"  (1.448 m)   Wt 131 lb (59.4 kg)   SpO2 97%   BMI 28.35 kg/m  Objective:   Physical Exam Exam conducted with a chaperone present.  HENT:     Right Ear: Tympanic membrane and ear canal normal.     Left Ear: Tympanic membrane and ear canal normal.  Eyes:     Pupils: Pupils are equal, round, and reactive to light.  Cardiovascular:     Rate and Rhythm: Normal rate and regular rhythm.  Pulmonary:     Effort: Pulmonary effort is normal.     Breath sounds: Normal breath sounds.  Abdominal:     General: Bowel sounds are normal.     Palpations: Abdomen is soft.     Tenderness: There is no abdominal tenderness.  Genitourinary:    Labia:        Right: No tenderness or lesion.         Left: No tenderness or lesion.      Vagina: Normal.     Cervix: Normal.     Uterus: Normal.      Adnexa: Right adnexa normal and left adnexa normal.     Comments: Widespread erythema to labia majora without open wounds. Improving according to patient. From wearing depends during hospital stay. Musculoskeletal:        General: Normal range of motion.     Cervical back: Neck supple.  Skin:    General: Skin is warm and dry.  Neurological:     Mental Status: She is alert and oriented to person, place, and time.     Cranial Nerves: No cranial nerve deficit.     Deep Tendon Reflexes:     Reflex Scores:      Patellar reflexes are 2+ on the right side and 2+ on the left side. Psychiatric:        Mood and Affect: Mood normal.           Assessment & Plan:  Preventative health care Assessment & Plan: Prevnar 20 provided today. Pap smear due, completed today Mammogram due, orders placed. Colonoscopy UTD, due 2027  Discussed the importance of a healthy diet and regular exercise in order for weight loss, and to reduce the risk of further co-morbidity.  Exam stable. Labs pending.reviewed Follow up in 1 year for repeat physical.    Essential hypertension Assessment & Plan: Controlled.  Continue lisinopril -hydrochlorothiazide  20-25 mg daily, metoprolol  tartrate 50 mg BID.  BMP reviewed from May 2025.   COPD, severity to be determined [facility]) Assessment & Plan: Improving. Successfully weaning down on oxygen. Oxygen saturation stable on room air.  Continue 1 liter during sleep and bedtime.  She had an old Qvar inhaler for which she finds helpful. Continue Qvar 2 puffs BID until gone, then switch to Arnuity Ellipta  once daily.    Moderate persistent asthma without complication Assessment &  Plan: Improving. Successfully weaning down on oxygen.  Continue 1 liter during sleep and bedtime.  She had an old Qvar inhaler for which she finds helpful. Continue Qvar 2 puffs BID  until gone, then switch to Arnuity Ellipta  once daily.    Adjustment disorder with mixed anxiety and depressed mood Assessment & Plan: No concerns today, continue to monitor.    Gastroesophageal reflux disease, unspecified whether esophagitis present Assessment & Plan: Controlled.   Remain off pantoprazole .    Hyperlipidemia, unspecified hyperlipidemia type Assessment & Plan: Stable.  Reviewed lipid panel from February 2025. Continue to monitor.   Continued atorvastatin  40 mg daily, Zetia  10 mg daily.   Seizures (HCC) Assessment & Plan: No seizures.  Successfully weaned off Keppra . Remain off.    Paroxysmal atrial fibrillation (HCC) Assessment & Plan: Rate and rhythm regular today.  She has been without Eliquis  5 mg since last visit.  She declines cardiology referral.   Resume Eliquis  5 mg twice daily, metoprolol  tartrate 50 mg twice daily. She will notify if she changes her mind regarding cardiology referral.  Orders: -     Apixaban ; Take 1 tablet (5 mg total) by mouth 2 (two) times daily. For stroke prevention  Dispense: 180 tablet; Refill: 3  Screening mammogram for breast cancer -     3D Screening Mammogram, Left and Right; Future  Screening for cervical cancer -     Cytology - PAP        Gabriel John, NP

## 2024-05-24 NOTE — Assessment & Plan Note (Signed)
 Improving. Successfully weaning down on oxygen.  Continue 1 liter during sleep and bedtime.  She had an old Qvar inhaler for which she finds helpful. Continue Qvar 2 puffs BID until gone, then switch to Arnuity Ellipta  once daily.

## 2024-05-24 NOTE — Assessment & Plan Note (Signed)
Controlled. Remain off pantoprazole.

## 2024-05-24 NOTE — Assessment & Plan Note (Addendum)
 Stable.  Reviewed lipid panel from February 2025. Continue to monitor.   Continued atorvastatin  40 mg daily, Zetia  10 mg daily.

## 2024-05-24 NOTE — Assessment & Plan Note (Addendum)
 Controlled.  Continue lisinopril -hydrochlorothiazide  20-25 mg daily, metoprolol  tartrate 50 mg BID.  BMP reviewed from May 2025.

## 2024-05-24 NOTE — Assessment & Plan Note (Addendum)
 Improving. Successfully weaning down on oxygen. Oxygen saturation stable on room air.  Continue 1 liter during sleep and bedtime.  She had an old Qvar inhaler for which she finds helpful. Continue Qvar 2 puffs BID until gone, then switch to Arnuity Ellipta  once daily.

## 2024-05-24 NOTE — Assessment & Plan Note (Signed)
 No seizures.  Successfully weaned off Keppra . Remain off.

## 2024-05-24 NOTE — Assessment & Plan Note (Signed)
 Prevnar 20 provided today. Pap smear due, completed today Mammogram due, orders placed. Colonoscopy UTD, due 2027  Discussed the importance of a healthy diet and regular exercise in order for weight loss, and to reduce the risk of further co-morbidity.  Exam stable. Labs pending.reviewed Follow up in 1 year for repeat physical.

## 2024-05-24 NOTE — Assessment & Plan Note (Signed)
 Rate and rhythm regular today.  She has been without Eliquis  5 mg since last visit.  She declines cardiology referral.   Resume Eliquis  5 mg twice daily, metoprolol  tartrate 50 mg twice daily. She will notify if she changes her mind regarding cardiology referral.

## 2024-05-24 NOTE — Patient Instructions (Signed)
 Resume apixaban  (Eliquis ) 5 mg twice daily for stroke prevention.  [facility]   It was a pleasure to see you today!

## 2024-05-24 NOTE — Assessment & Plan Note (Signed)
No concerns today, continue to monitor.

## 2024-05-28 ENCOUNTER — Other Ambulatory Visit: Payer: Self-pay | Admitting: Primary Care

## 2024-05-28 DIAGNOSIS — J4540 Moderate persistent asthma, uncomplicated: Secondary | ICD-10-CM

## 2024-05-28 DIAGNOSIS — J449 Chronic obstructive pulmonary disease, unspecified: Secondary | ICD-10-CM

## 2024-05-29 LAB — CYTOLOGY - PAP
Comment: NEGATIVE
Diagnosis: NEGATIVE
High risk HPV: NEGATIVE

## 2024-05-30 ENCOUNTER — Ambulatory Visit: Payer: Self-pay | Admitting: Primary Care

## 2024-06-07 ENCOUNTER — Encounter: Admitting: Primary Care

## 2024-06-19 ENCOUNTER — Telehealth: Payer: Self-pay | Admitting: Primary Care

## 2024-06-19 NOTE — Telephone Encounter (Signed)
 Copied from CRM [id number]. Topic: Clinical - Order For Equipment >> Jun 19, 2024  2:11 PM [place] M wrote: Reason for CRM: Patient calling to get her handicap placard updated, wanting to know if someone can call her >> Jun 19, 2024  3:17 PM [place] P wrote: Pt called back to advise she does not have a form, she would need PCP to provide it, once filled out pt request to have it mailed to new home address  [address]  [place] [address]

## 2024-06-19 NOTE — Telephone Encounter (Signed)
 Handicap placard placed in your inbox for review and completion

## 2024-06-19 NOTE — Telephone Encounter (Signed)
 Copied from CRM [id number]. Topic: Clinical - Order For Equipment >> Jun 19, 2024  2:11 PM [place] wrote: Reason for CRM: Patient calling to get her handicap placard updated, wanting to know if someone can call her

## 2024-06-19 NOTE — Telephone Encounter (Signed)
 Left voicemail advising patient she can drop off ppw if she has that or to let us  know if we need to provide copy of form.

## 2024-06-20 NOTE — Telephone Encounter (Signed)
 Completed form and placed in Kelli's inbox.

## 2024-06-20 NOTE — Telephone Encounter (Signed)
 Pt notified. Placard placed in the mail per patient request.

## 2024-06-21 ENCOUNTER — Encounter: Payer: Self-pay | Admitting: Pharmacist

## 2024-06-21 ENCOUNTER — Telehealth: Payer: Self-pay | Admitting: Primary Care

## 2024-06-21 NOTE — Telephone Encounter (Signed)
 Yes, she needs to be taking apixaban  (Eliquis ) 5 mg twice daily to help keep her blood thin.  She is at high risk for a stroke due to her atrial fibrillation.  I will forward this to our pharmacist to learn if there are other options or if she needs to reach a certain co-pay first. Morna, it looks like Ayla ran this through and got $50 copay for 30 day supply. Are there options for patient assistance? Not sure if this is still an option.

## 2024-06-21 NOTE — Progress Notes (Unsigned)
 Brief Telephone Documentation Reason for Call: High copay for eliquis   Summary of Call: Patient is insured through a commercial plan. Theoretically should be eligible for copay card to reduce Eliquis  cost at least until she signs up for Medicare (not eligible until March 2026).   Enrolled patient in Eliquis  savings card.  Group: [id number] ID: [id number] BIN: [id number] PCN: [id number]  Called patient's [facility] pharmacy to provide copay card details.  Pharmacy has entered copay card information into patient's profile for future use though note that they are not pulling up any primary insurance coverage for her.      Michelle Barnett, PharmD Clinical Pharmacist [facility] [phone]

## 2024-06-21 NOTE — Telephone Encounter (Signed)
 Copied from CRM [id number]. Topic: Clinical - Medication Question >> Jun 21, 2024  9:23 AM [name] wrote: Reason for CRM: patient calling asking about  apixaban  (ELIQUIS ) 5 MG TABS tablet  is that a medication that is cheaper than this and Patient is asking if it is necessary to take this medication.  Patient phone [phone]

## 2024-06-22 NOTE — Progress Notes (Signed)
 Noted.  Signed and placed in Kelli's inbox.

## 2024-06-22 NOTE — Progress Notes (Signed)
 Noted and appreciate the patient assistance! This is so great!

## 2024-06-23 NOTE — Progress Notes (Signed)
 PPW placed up front in designated folder for patient to come sign.

## 2024-06-25 ENCOUNTER — Other Ambulatory Visit: Payer: Self-pay | Admitting: Primary Care

## 2024-06-25 DIAGNOSIS — I10 Essential (primary) hypertension: Secondary | ICD-10-CM

## 2024-07-10 ENCOUNTER — Telehealth: Payer: Self-pay | Admitting: Primary Care

## 2024-07-10 NOTE — Telephone Encounter (Signed)
 Patient dropped income tax forms for application they are in provider box at front desk.

## 2024-07-18 ENCOUNTER — Telehealth: Payer: Self-pay | Admitting: Primary Care

## 2024-07-18 ENCOUNTER — Encounter: Payer: Self-pay | Admitting: Pharmacist

## 2024-07-18 ENCOUNTER — Other Ambulatory Visit: Payer: Self-pay | Admitting: Pharmacist

## 2024-07-18 NOTE — Telephone Encounter (Signed)
 Patient would like to discuss discount options for medication

## 2024-07-18 NOTE — Telephone Encounter (Signed)
 Copied from CRM [id number]. Topic: General - Other >> Jul 18, 2024 10:00 AM Drema MATSU wrote: Reason for CRM: Patient is calling to check a form so she can get her medication apixaban  (ELIQUIS ) 5 MG TABS tablet from the pharmacy. She is requesting a callback from the pharmacist regarding reduced rate.

## 2024-07-18 NOTE — Progress Notes (Signed)
 Patient Assistance Program (PAP) Application   Manufacturer: [employer] ([employer])    (New enrollment) Medication(s): Eliquis   Patient income documents received, app not signed.  07/18/24: Signature updated.   Application submitted. Scanned to chart

## 2024-07-18 NOTE — Progress Notes (Signed)
 Brief Telephone Documentation Reason for Call: Patient left message regarding question for pharmacist  Summary of Call: Returned patient call 7/22. No answer, left voicemail with direct callback number.   Patient estimates that they have about a week or so left of Eliquis  at home.   We reviewed that the Eliquis  application has been faxed to the program. Updates can be tracked via the [employer] phone number.   Reminder set for end of week to check status of application.   Follow Up: Patient given direct line for further questions/concerns.  Michelle Barnett, PharmD Clinical Pharmacist [facility] [phone]

## 2024-07-19 NOTE — Progress Notes (Signed)
 Application APPROVED 07/19/24 Fax has been scanned into chart.

## 2024-07-28 ENCOUNTER — Other Ambulatory Visit: Payer: Self-pay | Admitting: Primary Care

## 2024-07-28 DIAGNOSIS — I480 Paroxysmal atrial fibrillation: Secondary | ICD-10-CM

## 2024-07-28 NOTE — Telephone Encounter (Unsigned)
 Copied from CRM [id number]. Topic: Clinical - Medication Refill >> Jul 28, 2024  3:26 PM [name] wrote: Patient calling requesting refill  Patient would like to know where this prescription with be sent to. Patient received a letter from [facility]  Medication: apixaban  (ELIQUIS ) 5 MG TABS tablet  Has the patient contacted their pharmacy? No   This is the patient's preferred pharmacy:   [facility] [address] [place] - [address] [place]) [place] [address] Phone: [phone] Fax: [phone]  Is this the correct pharmacy for this prescription? No/  patient is unsure where to have prescription sent to.  If no, delete pharmacy and type the correct one.   Has the prescription been filled recently? Yes  Is the patient out of the medication? No  Has the patient been seen for an appointment in the last year OR does the patient have an upcoming appointment? Yes  Can we respond through MyChart? No  Agent: Please be advised that Rx refills may take up to 3 business days. We ask that you follow-up with your pharmacy.   Patient would like a call back, informing which pharmacy medication will be sent to. Due to the [facility] letter   Patient was informed  time frame for medication refills  is 3 business days.

## 2024-07-31 ENCOUNTER — Other Ambulatory Visit: Payer: Self-pay | Admitting: Primary Care

## 2024-07-31 ENCOUNTER — Other Ambulatory Visit: Payer: Self-pay

## 2024-07-31 DIAGNOSIS — E785 Hyperlipidemia, unspecified: Secondary | ICD-10-CM

## 2024-09-08 ENCOUNTER — Other Ambulatory Visit: Payer: Self-pay | Admitting: Primary Care

## 2024-09-12 ENCOUNTER — Ambulatory Visit: Payer: Self-pay | Admitting: General Practice

## 2024-09-20 ENCOUNTER — Ambulatory Visit: Payer: Self-pay | Admitting: Primary Care

## 2024-09-22 ENCOUNTER — Telehealth: Payer: Self-pay | Admitting: Primary Care

## 2024-09-22 NOTE — Telephone Encounter (Signed)
 Copied from CRM [id number]. Topic: General - Other >> Sep 22, 2024  2:39 PM Alfonso HERO wrote: Reason for CRM: Doyal from patients insurance called to inform office that the patient says she has blisters on her ankles that have fluid coming out of them. She also stated the patient was on blood thinners that she is no longer taking but has enough to last her through the end of October. The patient told her that she didn't mention the blisters when making her upcoming appt because she didn't think she had coverage. I input new coverage in the pts chart.Nurse Care coord. Is requesting for someone to call the pt when they can at [phone]

## 2024-10-04 ENCOUNTER — Ambulatory Visit: Payer: Self-pay | Admitting: Primary Care

## 2024-10-14 ENCOUNTER — Other Ambulatory Visit: Payer: Self-pay | Admitting: Primary Care

## 2024-10-14 DIAGNOSIS — J449 Chronic obstructive pulmonary disease, unspecified: Secondary | ICD-10-CM

## 2024-10-14 DIAGNOSIS — J4540 Moderate persistent asthma, uncomplicated: Secondary | ICD-10-CM

## 2024-12-16 ENCOUNTER — Other Ambulatory Visit: Payer: Self-pay | Admitting: Primary Care

## 2024-12-16 DIAGNOSIS — E785 Hyperlipidemia, unspecified: Secondary | ICD-10-CM

## 2024-12-16 DIAGNOSIS — J4540 Moderate persistent asthma, uncomplicated: Secondary | ICD-10-CM

## 2024-12-16 DIAGNOSIS — J449 Chronic obstructive pulmonary disease, unspecified: Secondary | ICD-10-CM
